# Patient Record
Sex: Female | Born: 1935 | Race: White | Hispanic: No | Smoking: Never smoker
Health system: Southern US, Community
[De-identification: ages and names within clinical notes are randomized; demographics above are authoritative.]

## PROBLEM LIST (undated history)

## (undated) DIAGNOSIS — K219 Gastro-esophageal reflux disease without esophagitis: Secondary | ICD-10-CM

## (undated) DIAGNOSIS — Z9889 Other specified postprocedural states: Secondary | ICD-10-CM

## (undated) DIAGNOSIS — I509 Heart failure, unspecified: Secondary | ICD-10-CM

## (undated) DIAGNOSIS — R112 Nausea with vomiting, unspecified: Secondary | ICD-10-CM

## (undated) DIAGNOSIS — R0600 Dyspnea, unspecified: Secondary | ICD-10-CM

## (undated) DIAGNOSIS — I10 Essential (primary) hypertension: Secondary | ICD-10-CM

## (undated) DIAGNOSIS — J189 Pneumonia, unspecified organism: Secondary | ICD-10-CM

## (undated) DIAGNOSIS — G629 Polyneuropathy, unspecified: Secondary | ICD-10-CM

## (undated) DIAGNOSIS — H35039 Hypertensive retinopathy, unspecified eye: Secondary | ICD-10-CM

## (undated) DIAGNOSIS — I429 Cardiomyopathy, unspecified: Secondary | ICD-10-CM

## (undated) DIAGNOSIS — I639 Cerebral infarction, unspecified: Secondary | ICD-10-CM

## (undated) DIAGNOSIS — M1990 Unspecified osteoarthritis, unspecified site: Secondary | ICD-10-CM

## (undated) DIAGNOSIS — H269 Unspecified cataract: Secondary | ICD-10-CM

## (undated) HISTORY — PX: APPENDECTOMY: SHX54

## (undated) HISTORY — PX: TONSILLECTOMY: SUR1361

## (undated) HISTORY — DX: Unspecified cataract: H26.9

## (undated) HISTORY — PX: TUBAL LIGATION: SHX77

## (undated) HISTORY — PX: ABDOMINAL HYSTERECTOMY: SHX81

## (undated) HISTORY — PX: CHOLECYSTECTOMY: SHX55

## (undated) HISTORY — DX: Hypertensive retinopathy, unspecified eye: H35.039

## (undated) HISTORY — PX: BLADDER SUSPENSION: SHX72

---

## 2006-10-05 DIAGNOSIS — Z8673 Personal history of transient ischemic attack (TIA), and cerebral infarction without residual deficits: Secondary | ICD-10-CM

## 2006-10-05 DIAGNOSIS — I639 Cerebral infarction, unspecified: Secondary | ICD-10-CM

## 2006-10-05 HISTORY — DX: Cerebral infarction, unspecified: I63.9

## 2011-10-06 HISTORY — PX: JOINT REPLACEMENT: SHX530

## 2015-10-06 HISTORY — PX: CARDIAC CATHETERIZATION: SHX172

## 2016-08-12 ENCOUNTER — Other Ambulatory Visit: Payer: Self-pay | Admitting: Family Medicine

## 2016-08-12 DIAGNOSIS — R9389 Abnormal findings on diagnostic imaging of other specified body structures: Secondary | ICD-10-CM

## 2016-08-14 ENCOUNTER — Ambulatory Visit
Admission: RE | Admit: 2016-08-14 | Discharge: 2016-08-14 | Disposition: A | Discharge status: Home / Self Care | Payer: Medicare Other | Source: Ambulatory Visit | Attending: Family Medicine | Admitting: Family Medicine

## 2016-08-14 DIAGNOSIS — R9389 Abnormal findings on diagnostic imaging of other specified body structures: Secondary | ICD-10-CM

## 2016-08-14 IMAGING — CT CT CHEST W/ CM
1 series · 15 of 34 positions shown, 19 images · IV contrast (agent unspecified)
Comparison: Chest radiographs [DATE], [DATE]

CLINICAL DATA: Fullness in the left hilar area, abnormal chest
x-ray

EXAM:
CT CHEST WITH CONTRAST
TECHNIQUE: Multidetector CT imaging of the chest was performed during
intravenous contrast administration.
CONTRAST:  89 mL [2F]

[Series 2: chest w/cm · axial · 0.72mm/px · z∈[+1132,+1386]mm · 15 of 149 slices shown, 19 images]
[im 11/149  mediastinal]
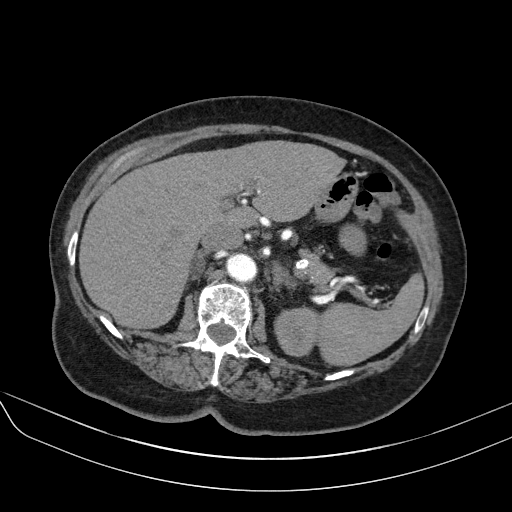
[im 11/149  lung]
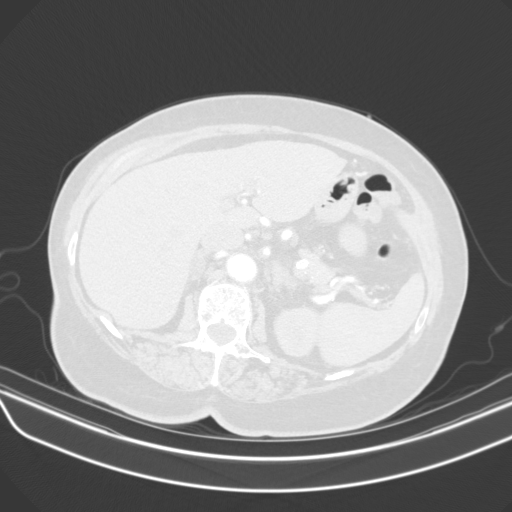
[im 22/149  lung]
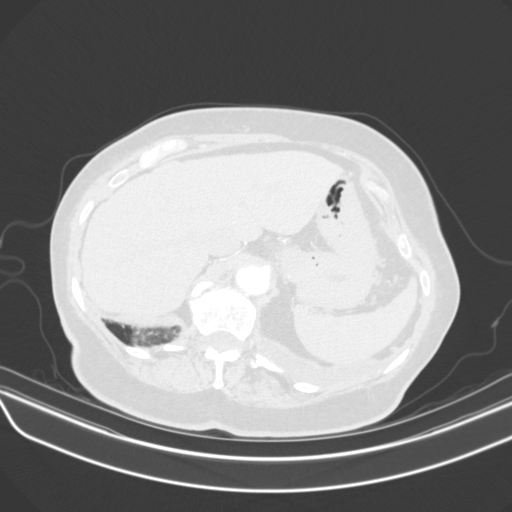
[im 30/149  lung]
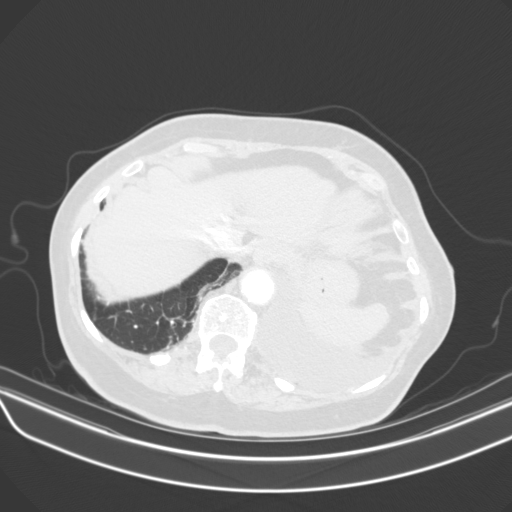
[im 39/149  lung]
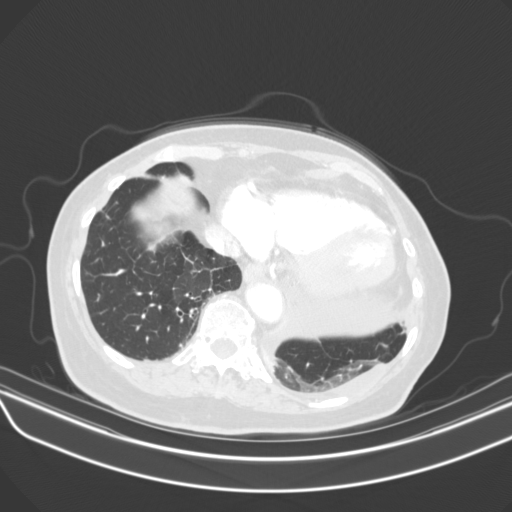
[im 50/149  mediastinal]
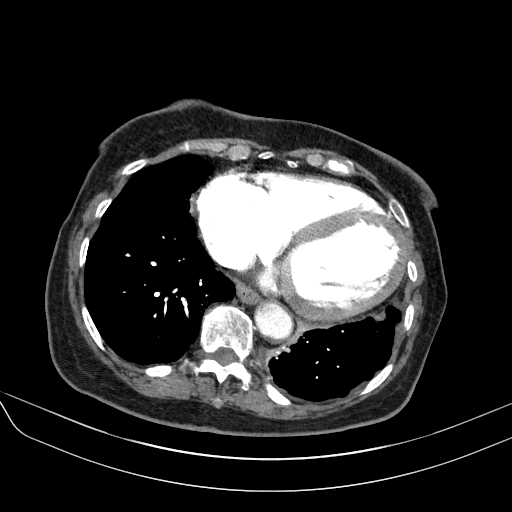
[im 50/149  lung]
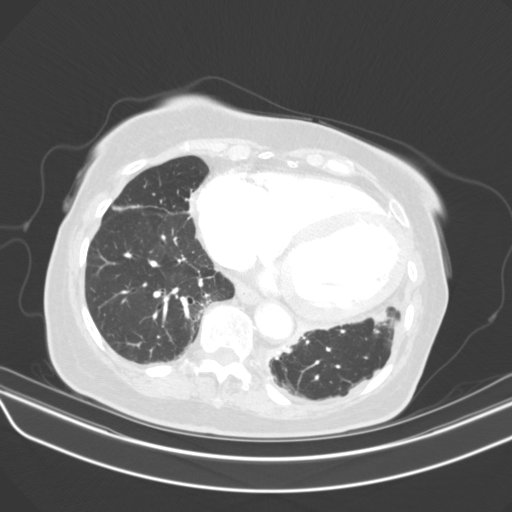
[im 60/149  lung]
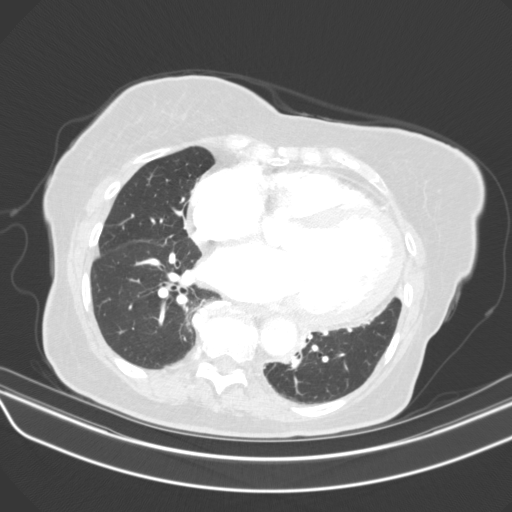
[im 66/149  lung]
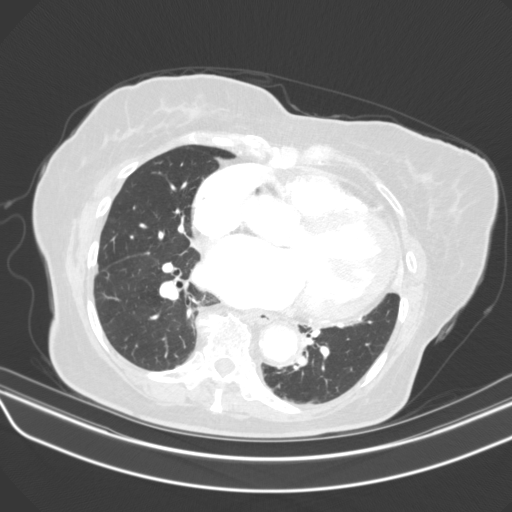
[im 77/149  lung]
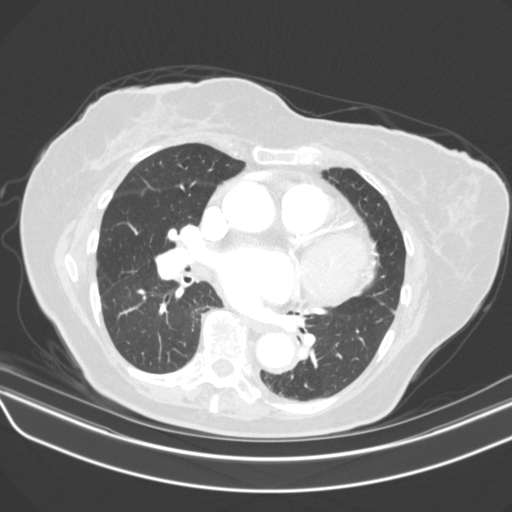
[im 83/149  mediastinal]
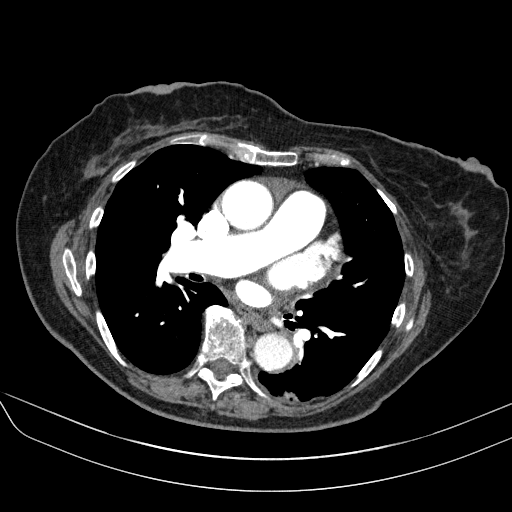
[im 83/149  lung]
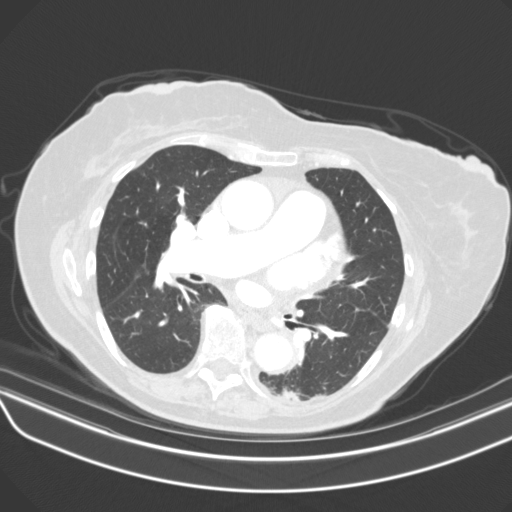
[im 89/149  lung]
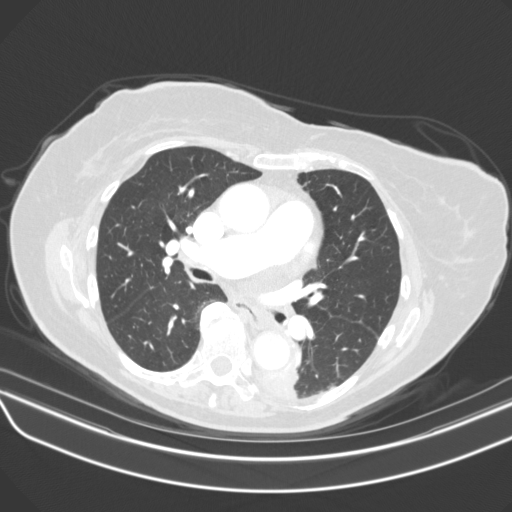
[im 99/149  lung]
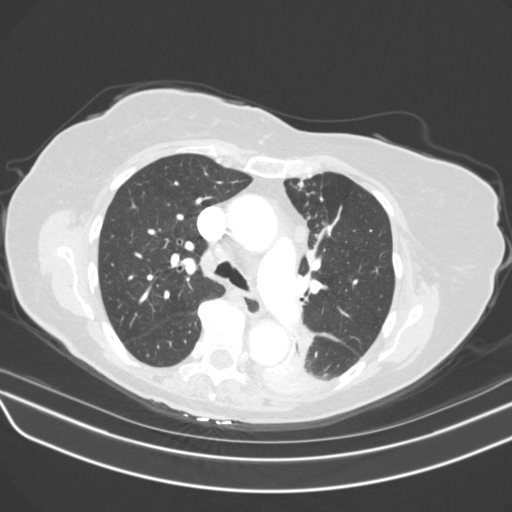
[im 110/149  lung]
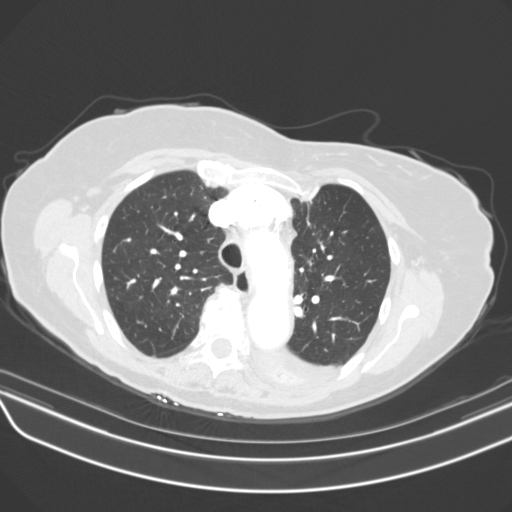
[im 119/149  mediastinal]
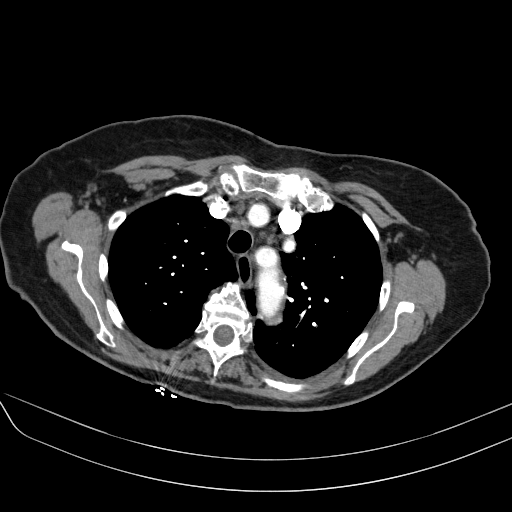
[im 119/149  lung]
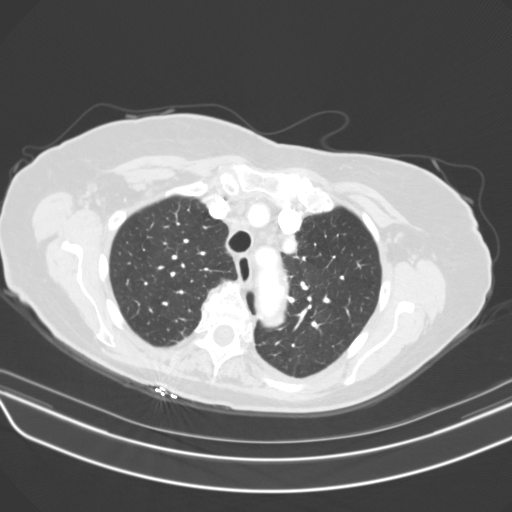
[im 127/149  lung]
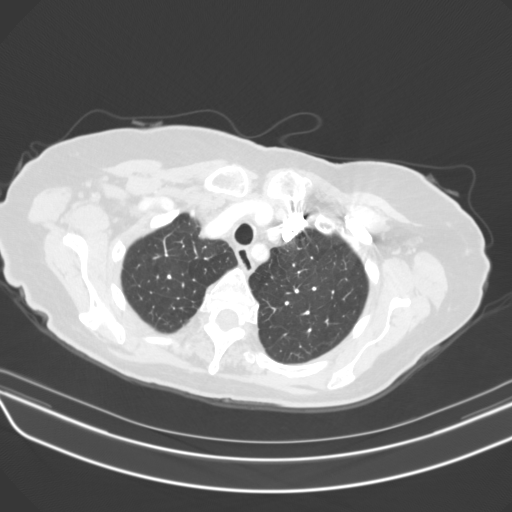
[im 138/149  lung]
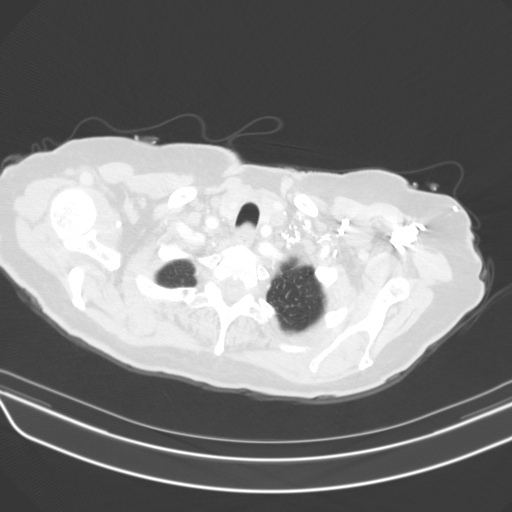

[15 of 34 positions shown; findings below may reference images not displayed]

FINDINGS: Cardiovascular: There is ectasia of the ascending aorta measuring up
to 3.3 cm. There is no dissection seen. The pulmonary arteries
appear enlarged, right greater than left. Moderate cardiomegaly.
Bovine arch variant. Trace pericardial effusion.

Mediastinum/Nodes: There are scattered lymph nodes within the
mediastinum. A AP window lymph node measures 12 mm. A precarinal
lymph node measures 1 cm. Trachea and mainstem bronchi appear within
normal limits. Thyroid gland is slightly heterogeneous. Air
distention of the esophagus but otherwise normal.

Lungs/Pleura: There is a small left-sided pleural effusion. There is
partial consolidation within the superior segment of the left lower
lobe with air bronchograms, corresponding to radiographic
abnormality. Other scattered foci of mildly nodular probable
infiltrate is seen within the left upper lobe medially. No
pneumothorax. Mild atelectasis or scarring in the left lower lobe
posteriorly.

Upper Abdomen: Upper abdomen demonstrates a 1.4 cm right adrenal
gland adenoma. Mild nodular thickening of left adrenal gland without
discrete mass. The stomach appears thickened but is decompressed.

Musculoskeletal: Multilevel degenerative changes of the spine. No
acute osseous abnormality.
IMPRESSION: 1. Partial consolidation within the superior segment of the left
lower lobe with air bronchograms, corresponding to radiographic
abnormality. Findings favor a pneumonia. There is associated small
left-sided pleural effusion. Additional nodular opacities are
present within the medial aspect of the left upper lobe suspicious
for additional pneumonic infiltrate. Short interval CT follow-up is
recommended to ensure clearing. Mild mediastinal adenopathy likely
reactive.

2. Cardiomegaly with trace pericardial effusion.

3. Mild ectasia of ascending aorta.  Bovine arch variant.

4. Gastric wall thickening but stomach is decompressed

5. 1.4 cm right adrenal gland adenoma.

## 2016-08-19 ENCOUNTER — Other Ambulatory Visit: Payer: Self-pay | Admitting: Family Medicine

## 2016-08-19 DIAGNOSIS — R9389 Abnormal findings on diagnostic imaging of other specified body structures: Secondary | ICD-10-CM

## 2016-09-17 ENCOUNTER — Other Ambulatory Visit: Payer: Self-pay | Admitting: Family Medicine

## 2016-09-17 ENCOUNTER — Ambulatory Visit
Admission: RE | Admit: 2016-09-17 | Discharge: 2016-09-17 | Disposition: A | Discharge status: Home / Self Care | Payer: Medicare Other | Source: Ambulatory Visit | Attending: Family Medicine | Admitting: Family Medicine

## 2016-09-17 ENCOUNTER — Other Ambulatory Visit: Payer: Medicare Other

## 2016-09-17 DIAGNOSIS — R9389 Abnormal findings on diagnostic imaging of other specified body structures: Secondary | ICD-10-CM

## 2016-09-17 DIAGNOSIS — I7100 Dissection of unspecified site of aorta: Secondary | ICD-10-CM

## 2016-09-17 DIAGNOSIS — J90 Pleural effusion, not elsewhere classified: Secondary | ICD-10-CM

## 2016-09-17 MED ORDER — IOPAMIDOL (ISOVUE-300) INJECTION 61%
80.0000 mL | Freq: Once | INTRAVENOUS | Status: AC | PRN
  Administered 2016-09-17: 80 mL via INTRAVENOUS

## 2016-09-18 ENCOUNTER — Ambulatory Visit
Admission: RE | Admit: 2016-09-18 | Discharge: 2016-09-18 | Disposition: A | Discharge status: Home / Self Care | Payer: Medicare Other | Source: Ambulatory Visit | Attending: Family Medicine | Admitting: Family Medicine

## 2016-09-18 DIAGNOSIS — J90 Pleural effusion, not elsewhere classified: Secondary | ICD-10-CM

## 2016-09-18 DIAGNOSIS — I7100 Dissection of unspecified site of aorta: Secondary | ICD-10-CM

## 2016-09-18 MED ORDER — IOPAMIDOL (ISOVUE-370) INJECTION 76%
100.0000 mL | Freq: Once | INTRAVENOUS | Status: AC | PRN
  Administered 2016-09-18: 100 mL via INTRAVENOUS

## 2016-10-01 ENCOUNTER — Other Ambulatory Visit: Payer: Medicare Other

## 2017-04-27 ENCOUNTER — Other Ambulatory Visit (HOSPITAL_COMMUNITY): Payer: Self-pay | Admitting: Emergency Medicine

## 2017-04-27 NOTE — Patient Instructions (Signed)
Barbara Kelley  04/27/2017   Your procedure is scheduled on: 05-04-17  Report to [facility] Main  Entrance   Report to admitting at 10:25AM   Call this number if you have problems the morning of surgery  [phone]   Remember: ONLY 1 PERSON MAY GO WITH YOU TO SHORT STAY TO GET  READY MORNING OF YOUR SURGERY.  Do not eat food or drink liquids :After Midnight.     Take these medicines the morning of surgery with A SIP OF WATER: carvedilol(coreg)                                 You may not have any metal on your body including hair pins and              piercings  Do not wear jewelry, make-up, lotions, powders or perfumes, deodorant             Do not wear nail polish.  Do not shave  48 hours prior to surgery.                Do not bring valuables to the hospital. Barbara Kelley.  Contacts, dentures or bridgework may not be worn into surgery.  Leave suitcase in the car. After surgery it may be brought to your room.               Please read over the following fact sheets you were given: _____________________________________________________________________           [facility] - Preparing for Surgery Before surgery, you can play an important role.  Because skin is not sterile, your skin needs to be as free of germs as possible.  You can reduce the number of germs on your skin by washing with CHG (chlorahexidine gluconate) soap before surgery.  CHG is an antiseptic cleaner which kills germs and bonds with the skin to continue killing germs even after washing. Please DO NOT use if you have an allergy to CHG or antibacterial soaps.  If your skin becomes reddened/irritated stop using the CHG and inform your nurse when you arrive at Short Stay. Do not shave (including legs and underarms) for at least 48 hours prior to the first CHG shower.  You may shave your face/neck. Please follow these instructions carefully:  1.   Shower with CHG Soap the night before surgery and the  morning of Surgery.  2.  If you choose to wash your hair, wash your hair first as usual with your  normal  shampoo.  3.  After you shampoo, rinse your hair and body thoroughly to remove the  shampoo.                           4.  Use CHG as you would any other liquid soap.  You can apply chg directly  to the skin and wash                       Gently with a scrungie or clean washcloth.  5.  Apply the CHG Soap to your body ONLY FROM THE NECK DOWN.   Do not use on face/  open                           Wound or open sores. Avoid contact with eyes, ears mouth and genitals (private parts).                       Wash face,  Genitals (private parts) with your normal soap.             6.  Wash thoroughly, paying special attention to the area where your surgery  will be performed.  7.  Thoroughly rinse your body with warm water from the neck down.  8.  DO NOT shower/wash with your normal soap after using and rinsing off  the CHG Soap.                9.  Pat yourself dry with a clean towel.            10.  Wear clean pajamas.            11.  Place clean sheets on your bed the night of your first shower and do not  sleep with pets. Day of Surgery : Do not apply any lotions/deodorants the morning of surgery.  Please wear clean clothes to the hospital/surgery center.  FAILURE TO FOLLOW THESE INSTRUCTIONS MAY RESULT IN THE CANCELLATION OF YOUR SURGERY PATIENT SIGNATURE_________________________________  NURSE SIGNATURE__________________________________  ________________________________________________________________________   Adam Phenix  An incentive spirometer is a tool that can help keep your lungs clear and active. This tool measures how well you are filling your lungs with each breath. Taking long deep breaths may help reverse or decrease the chance of developing breathing (pulmonary) problems (especially infection) following:  A long  period of time when you are unable to move or be active. BEFORE THE PROCEDURE   If the spirometer includes an indicator to show your best effort, your nurse or respiratory therapist will set it to a desired goal.  If possible, sit up straight or lean slightly forward. Try not to slouch.  Hold the incentive spirometer in an upright position. INSTRUCTIONS FOR USE  1. Sit on the edge of your bed if possible, or sit up as far as you can in bed or on a chair. 2. Hold the incentive spirometer in an upright position. 3. Breathe out normally. 4. Place the mouthpiece in your mouth and seal your lips tightly around it. 5. Breathe in slowly and as deeply as possible, raising the piston or the ball toward the top of the column. 6. Hold your breath for 3-5 seconds or for as long as possible. Allow the piston or ball to fall to the bottom of the column. 7. Remove the mouthpiece from your mouth and breathe out normally. 8. Rest for a few seconds and repeat Steps 1 through 7 at least 10 times every 1-2 hours when you are awake. Take your time and take a few normal breaths between deep breaths. 9. The spirometer may include an indicator to show your best effort. Use the indicator as a goal to work toward during each repetition. 10. After each set of 10 deep breaths, practice coughing to be sure your lungs are clear. If you have an incision (the cut made at the time of surgery), support your incision when coughing by placing a pillow or rolled up towels firmly against it. Once you are able to get out of bed, walk around indoors and  cough well. You may stop using the incentive spirometer when instructed by your caregiver.  RISKS AND COMPLICATIONS  Take your time so you do not get dizzy or light-headed.  If you are in pain, you may need to take or ask for pain medication before doing incentive spirometry. It is harder to take a deep breath if you are having pain. AFTER USE  Rest and breathe slowly and  easily.  It can be helpful to keep track of a log of your progress. Your caregiver can provide you with a simple table to help with this. If you are using the spirometer at home, follow these instructions: [place] IF:   You are having difficultly using the spirometer.  You have trouble using the spirometer as often as instructed.  Your pain medication is not giving enough relief while using the spirometer.  You develop fever of 100.5 F (38.1 C) or higher. SEEK IMMEDIATE MEDICAL CARE IF:   You cough up bloody sputum that had not been present before.  You develop fever of 102 F (38.9 C) or greater.  You develop worsening pain at or near the incision site. MAKE SURE YOU:   Understand these instructions.  Will watch your condition.  Will get help right away if you are not doing well or get worse. Document Released: 02/01/2007 Document Revised: 12/14/2011 Document Reviewed: 04/04/2007 Exit[employer] Patient Information 2014 [employer], [place].   ________________________________________________________________________  WHAT IS A BLOOD TRANSFUSION? Blood Transfusion Information  A transfusion is the replacement of blood or some of its parts. Blood is made up of multiple cells which provide different functions.  Red blood cells carry oxygen and are used for blood loss replacement.  White blood cells fight against infection.  Platelets control bleeding.  Plasma helps clot blood.  Other blood products are available for specialized needs, such as hemophilia or other clotting disorders. BEFORE THE TRANSFUSION  Who gives blood for transfusions?   Healthy volunteers who are fully evaluated to make sure their blood is safe. This is blood bank blood. Transfusion therapy is the safest it has ever been in the practice of medicine. Before blood is taken from a donor, a complete history is taken to make sure that person has no history of diseases nor engages in risky social  behavior (examples are intravenous drug use or sexual activity with multiple partners). The donor's travel history is screened to minimize risk of transmitting infections, such as malaria. The donated blood is tested for signs of infectious diseases, such as HIV and hepatitis. The blood is then tested to be sure it is compatible with you in order to minimize the chance of a transfusion reaction. If you or a relative donates blood, this is often done in anticipation of surgery and is not appropriate for emergency situations. It takes many days to process the donated blood. RISKS AND COMPLICATIONS Although transfusion therapy is very safe and saves many lives, the main dangers of transfusion include:   Getting an infectious disease.  Developing a transfusion reaction. This is an allergic reaction to something in the blood you were given. Every precaution is taken to prevent this. The decision to have a blood transfusion has been considered carefully by your caregiver before blood is given. Blood is not given unless the benefits outweigh the risks. AFTER THE TRANSFUSION  Right after receiving a blood transfusion, you will usually feel much better and more energetic. This is especially true if your red blood cells have gotten low (anemic).  The transfusion raises the level of the red blood cells which carry oxygen, and this usually causes an energy increase.  The nurse administering the transfusion will monitor you carefully for complications. HOME CARE INSTRUCTIONS  No special instructions are needed after a transfusion. You may find your energy is better. Speak with your caregiver about any limitations on activity for underlying diseases you may have. SEEK MEDICAL CARE IF:   Your condition is not improving after your transfusion.  You develop redness or irritation at the intravenous (IV) site. SEEK IMMEDIATE MEDICAL CARE IF:  Any of the following symptoms occur over the next 12 hours:  Shaking  chills.  You have a temperature by mouth above 102 F (38.9 C), not controlled by medicine.  Chest, back, or muscle pain.  People around you feel you are not acting correctly or are confused.  Shortness of breath or difficulty breathing.  Dizziness and fainting.  You get a rash or develop hives.  You have a decrease in urine output.  Your urine turns a dark color or changes to pink, red, or brown. Any of the following symptoms occur over the next 10 days:  You have a temperature by mouth above 102 F (38.9 C), not controlled by medicine.  Shortness of breath.  Weakness after normal activity.  The white part of the eye turns yellow (jaundice).  You have a decrease in the amount of urine or are urinating less often.  Your urine turns a dark color or changes to pink, red, or brown. Document Released: 09/18/2000 Document Revised: 12/14/2011 Document Reviewed: 05/07/2008 [facility] Patient Information 2014 [place], [place].  _______________________________________________________________________

## 2017-04-27 NOTE — Progress Notes (Signed)
[place] cardiology Dr Wynonia Lawman 02-26-17 on chart   EKG 02-26-17 on chart   CT angio chest 09-18-16 epic

## 2017-04-28 ENCOUNTER — Encounter (HOSPITAL_COMMUNITY): Payer: Self-pay

## 2017-04-28 ENCOUNTER — Encounter (INDEPENDENT_AMBULATORY_CARE_PROVIDER_SITE_OTHER): Payer: Self-pay

## 2017-04-28 ENCOUNTER — Encounter (HOSPITAL_COMMUNITY)
Admission: RE | Admit: 2017-04-28 | Discharge: 2017-04-28 | Disposition: A | Discharge status: Home / Self Care | Payer: Medicare Other | Source: Ambulatory Visit | Attending: Orthopedic Surgery | Admitting: Orthopedic Surgery

## 2017-04-28 DIAGNOSIS — M1712 Unilateral primary osteoarthritis, left knee: Secondary | ICD-10-CM | POA: Diagnosis not present

## 2017-04-28 DIAGNOSIS — Z01818 Encounter for other preprocedural examination: Secondary | ICD-10-CM | POA: Diagnosis not present

## 2017-04-28 HISTORY — DX: Cardiomyopathy, unspecified: I42.9

## 2017-04-28 HISTORY — DX: Essential (primary) hypertension: I10

## 2017-04-28 HISTORY — DX: Nausea with vomiting, unspecified: Z98.890

## 2017-04-28 HISTORY — DX: Polyneuropathy, unspecified: G62.9

## 2017-04-28 HISTORY — DX: Pneumonia, unspecified organism: J18.9

## 2017-04-28 HISTORY — DX: Unspecified osteoarthritis, unspecified site: M19.90

## 2017-04-28 HISTORY — DX: Cerebral infarction, unspecified: I63.9

## 2017-04-28 HISTORY — DX: Nausea with vomiting, unspecified: R11.2

## 2017-04-28 HISTORY — DX: Heart failure, unspecified: I50.9

## 2017-04-28 HISTORY — DX: Gastro-esophageal reflux disease without esophagitis: K21.9

## 2017-04-28 LAB — CBC
HEMATOCRIT: 42.5 % (ref 36.0–46.0)
HEMOGLOBIN: 13.9 g/dL (ref 12.0–15.0)
MCH: 28.4 pg (ref 26.0–34.0)
MCHC: 32.7 g/dL (ref 30.0–36.0)
MCV: 86.9 fL (ref 78.0–100.0)
Platelets: 198 10*3/uL (ref 150–400)
RBC: 4.89 MIL/uL (ref 3.87–5.11)
RDW: 13.4 % (ref 11.5–15.5)
WBC: 9.5 10*3/uL (ref 4.0–10.5)

## 2017-04-28 LAB — SURGICAL PCR SCREEN
MRSA, PCR: NEGATIVE
STAPHYLOCOCCUS AUREUS: NEGATIVE

## 2017-04-28 LAB — BASIC METABOLIC PANEL
ANION GAP: 7 (ref 5–15)
BUN: 16 mg/dL (ref 6–20)
CALCIUM: 9.5 mg/dL (ref 8.9–10.3)
CHLORIDE: 102 mmol/L (ref 101–111)
CO2: 32 mmol/L (ref 22–32)
CREATININE: 0.73 mg/dL (ref 0.44–1.00)
GFR calc non Af Amer: >60 mL/min (ref >60)
GLUCOSE: 117 mg/dL — AB (ref 65–99)
Potassium: 4.2 mmol/L (ref 3.5–5.1)
Sodium: 141 mmol/L (ref 135–145)

## 2017-04-28 LAB — ABO/RH: ABO/RH(D): A POS

## 2017-04-28 NOTE — Progress Notes (Signed)
Requested records received . Cardiac catherization results personally reviewed by Dr Jenita Seashore of anesthesiology . OK to proceed with surgery as  Planned.

## 2017-04-28 NOTE — Progress Notes (Signed)
RN called and spoke with Dr Annye Asa about Barbara Kelley d/t ECHO result showing 25-30% EF in presence of nonischemic cardiomyopathy . Reviewed with Dr Glennon Mac, results of ECHO, pt cardiac hx and management including recommendations and clearance from her cardiologist . Dr Glennon Mac requests that Barbara Kelley cardiac catherization be obtained in order to examine for presence of any blockages . Otherwise Glennon Mac, agrees with patient cardiologist  that patient is a "stable risk" and may proceed with surgery. RN informed Barbara Kelley of this recommendation and Barbara Kelley assured Rn that her cardiologist should have the records of the [place].   RN called cardiology office and LVMM requesting card. Cath records .

## 2017-04-29 NOTE — H&P (Signed)
TOTAL KNEE ADMISSION H&P  Patient is being admitted for left total knee arthroplasty.  Subjective:  Chief Complaint:   Left knee primary OA / pain  HPI: Barbara Kelley, 83 y.o. female, has a history of pain and functional disability in the left knee due to arthritis and has failed non-surgical conservative treatments for greater than 12 weeks to include NSAID's and/or analgesics, corticosteriod injections, use of assistive devices and activity modification.  Onset of symptoms was gradual, starting 25+ years ago with gradually worsening course since that time. The patient noted no past surgery on the left knee(s).  Patient currently rates pain in the left knee(s) at 8 out of 10 with activity. Patient has worsening of pain with activity and weight bearing, pain that interferes with activities of daily living, pain with passive range of motion, crepitus and joint swelling.  Patient has evidence of periarticular osteophytes and joint space narrowing by imaging studies.  There is no active infection.  Risks, benefits and expectations were discussed with the patient.  Risks including but not limited to the risk of anesthesia, blood clots, nerve damage, blood vessel damage, failure of the prosthesis, infection and up to and including death.  Patient understand the risks, benefits and expectations and wishes to proceed with surgery.   PCP: [name], MD  D/C Plans:       [facility]  Post-op Meds:       No Rx given   Tranexamic Acid:      To be given - IV   Decadron:      Is to be given  FYI:     ASA  Norco   DME:   Pt already has equipment   PT: HHPT then OPPT   Past Medical History:  Diagnosis Date  . Arthritis   . Cardiomyopathy ([place])   . Congestive heart failure (CHF) ([place])   . GERD (gastroesophageal reflux disease)    currently non problematic   . Hypertension   . 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 Pneumonia    x2 most recent fall of  2017   . PONV (postoperative nausea and vomiting)   . Stroke [facility]) 2008   deficit of neuroplathy in left hand and left foot     Past Surgical History:  Procedure Laterality Date  . ABDOMINAL HYSTERECTOMY    . APPENDECTOMY    . BLADDER SUSPENSION    . CARDIAC CATHETERIZATION  10/2015  . CHOLECYSTECTOMY    . JOINT REPLACEMENT Left 2013   left hip  . TONSILLECTOMY    . TUBAL LIGATION      No prescriptions prior to admission.   Allergies  Allergen Reactions  . Doxycycline Nausea Only    Vertigo  . Macrobid [Nitrofurantoin] Other (See Comments)    Nerve pain  . Valsartan Other (See Comments)    Blood pressure rise  . Calcium Channel Blockers Palpitations    Blow pressure rise / tachycardia   . Penicillins Rash    Social History  Substance Use Topics  . Smoking status: Never Smoker  . Smokeless tobacco: Never Used  . Alcohol use No       Review of Systems  Constitutional: Negative.   HENT: Negative.   Eyes: Negative.   Respiratory: Negative.   Cardiovascular: Negative.   Gastrointestinal: Positive for heartburn.  Genitourinary: Positive for frequency.  Musculoskeletal: Positive for joint pain.  Skin: Negative.   Neurological: Negative.  Endo/Heme/Allergies: Positive for environmental allergies.  Psychiatric/Behavioral: Negative.     Objective:  Physical Exam  Constitutional: She is oriented to person, place, and time. She appears well-developed.  HENT:  Head: Normocephalic.  Eyes: Pupils are equal, round, and reactive to light.  Neck: Neck supple. No JVD present. No tracheal deviation present. No thyromegaly present.  Cardiovascular: Normal rate, regular rhythm and intact distal pulses.   Respiratory: Effort normal and breath sounds normal. No respiratory distress. She has no wheezes.  GI: Soft. There is no tenderness. There is no guarding.  Musculoskeletal:       Left knee: She exhibits decreased range of motion, swelling and bony tenderness. She exhibits  no ecchymosis, no deformity, no laceration and no erythema. Tenderness found.  Lymphadenopathy:    She has no cervical adenopathy.  Neurological: She is alert and oriented to person, place, and time. A sensory deficit (neuropathy in left foot (from stroke)) is present.  Skin: Skin is warm and dry.  Psychiatric: She has a normal mood and affect.    Vital signs in last 24 hours: Temp:  [98.2 F (36.8 C)] 98.2 F (36.8 C) (07/25 1129) Pulse Rate:  [75] 75 (07/25 1129) Resp:  [18] 18 (07/25 1129) BP: (168)/(86) 168/86 (07/25 1129) SpO2:  [98 %] 98 % (07/25 1129) Weight:  [59.5 kg (131 lb 1.6 oz)] 59.5 kg (131 lb 1.6 oz) (07/25 1129)  Labs:   Estimated body mass index is 26.48 kg/m as calculated from the following:   Height as of 04/28/17: 4\' 11"  (1.499 m).   Weight as of 04/28/17: 59.5 kg (131 lb 1.6 oz).   Imaging Review Plain radiographs demonstrate severe degenerative joint disease of the left knee(s).  The bone quality appears to be good for age and reported activity level.  Assessment/Plan:  End stage arthritis, left knee   The patient history, physical examination, clinical judgment of the provider and imaging studies are consistent with end stage degenerative joint disease of the left knee(s) and total knee arthroplasty is deemed medically necessary. The treatment options including medical management, injection therapy arthroscopy and arthroplasty were discussed at length. The risks and benefits of total knee arthroplasty were presented and reviewed. The risks due to aseptic loosening, infection, stiffness, patella tracking problems, thromboembolic complications and other imponderables were discussed. The patient acknowledged the explanation, agreed to proceed with the plan and consent was signed. Patient is being admitted for inpatient treatment for surgery, pain control, PT, OT, prophylactic antibiotics, VTE prophylaxis, progressive ambulation and ADL's and discharge planning.  The patient is planning to be discharged home.    [place] P[place] Jestin Burbach   PA-C  04/29/2017, 10:52 AM

## 2017-05-04 ENCOUNTER — Inpatient Hospital Stay (HOSPITAL_COMMUNITY)
Admission: RE | Admit: 2017-05-04 | Discharge: 2017-05-06 | DRG: 470 | Disposition: A | Discharge status: Skilled Nursing Facility | Payer: Medicare Other | Source: Ambulatory Visit | Attending: Orthopedic Surgery | Admitting: Orthopedic Surgery

## 2017-05-04 ENCOUNTER — Inpatient Hospital Stay (HOSPITAL_COMMUNITY): Payer: Medicare Other | Admitting: Anesthesiology

## 2017-05-04 ENCOUNTER — Encounter (HOSPITAL_COMMUNITY)
Admission: RE | Disposition: A | Discharge status: Skilled Nursing Facility | Payer: Self-pay | Source: Ambulatory Visit | Attending: Orthopedic Surgery

## 2017-05-04 ENCOUNTER — Encounter (HOSPITAL_COMMUNITY): Payer: Self-pay | Admitting: *Deleted

## 2017-05-04 DIAGNOSIS — Z96652 Presence of left artificial knee joint: Secondary | ICD-10-CM

## 2017-05-04 DIAGNOSIS — I509 Heart failure, unspecified: Secondary | ICD-10-CM | POA: Diagnosis present

## 2017-05-04 DIAGNOSIS — Z90710 Acquired absence of both cervix and uterus: Secondary | ICD-10-CM | POA: Diagnosis not present

## 2017-05-04 DIAGNOSIS — Z881 Allergy status to other antibiotic agents status: Secondary | ICD-10-CM | POA: Diagnosis not present

## 2017-05-04 DIAGNOSIS — I429 Cardiomyopathy, unspecified: Secondary | ICD-10-CM | POA: Diagnosis not present

## 2017-05-04 DIAGNOSIS — I110 Hypertensive heart disease with heart failure: Secondary | ICD-10-CM | POA: Diagnosis not present

## 2017-05-04 DIAGNOSIS — I69398 Other sequelae of cerebral infarction: Secondary | ICD-10-CM | POA: Diagnosis not present

## 2017-05-04 DIAGNOSIS — K219 Gastro-esophageal reflux disease without esophagitis: Secondary | ICD-10-CM | POA: Diagnosis present

## 2017-05-04 DIAGNOSIS — M1712 Unilateral primary osteoarthritis, left knee: Principal | ICD-10-CM | POA: Diagnosis present

## 2017-05-04 DIAGNOSIS — Z880 Allergy status to penicillin: Secondary | ICD-10-CM

## 2017-05-04 DIAGNOSIS — Z888 Allergy status to other drugs, medicaments and biological substances status: Secondary | ICD-10-CM | POA: Diagnosis not present

## 2017-05-04 DIAGNOSIS — Z96659 Presence of unspecified artificial knee joint: Secondary | ICD-10-CM

## 2017-05-04 HISTORY — PX: TOTAL KNEE ARTHROPLASTY: SHX125

## 2017-05-04 LAB — TYPE AND SCREEN
ABO/RH(D): A POS
ANTIBODY SCREEN: NEGATIVE

## 2017-05-04 SURGERY — ARTHROPLASTY, KNEE, TOTAL
Anesthesia: Monitor Anesthesia Care | Site: Knee | Laterality: Left

## 2017-05-04 MED ORDER — DEXAMETHASONE SODIUM PHOSPHATE 10 MG/ML IJ SOLN
10.0000 mg | Freq: Once | INTRAMUSCULAR | Status: AC
  Administered 2017-05-04: 10 mg via INTRAVENOUS

## 2017-05-04 MED ORDER — TRANEXAMIC ACID 1000 MG/10ML IV SOLN
1000.0000 mg | INTRAVENOUS | Status: AC
  Administered 2017-05-04: 1000 mg via INTRAVENOUS
  Filled 2017-05-04: qty 10

## 2017-05-04 MED ORDER — KETOROLAC TROMETHAMINE 30 MG/ML IJ SOLN
INTRAMUSCULAR | Status: DC | PRN
  Administered 2017-05-04: 30 mg

## 2017-05-04 MED ORDER — FUROSEMIDE 20 MG PO TABS
20.0000 mg | ORAL_TABLET | Freq: Every day | ORAL | Status: DC
  Administered 2017-05-04 – 2017-05-06 (×3): 20 mg via ORAL
  Filled 2017-05-04 (×3): qty 1

## 2017-05-04 MED ORDER — HYDROCODONE-ACETAMINOPHEN 7.5-325 MG PO TABS
1.0000 | ORAL_TABLET | ORAL | Status: DC
  Administered 2017-05-04 – 2017-05-06 (×12): 1 via ORAL
  Administered 2017-05-06: 2 via ORAL
  Administered 2017-05-06: 1 via ORAL
  Filled 2017-05-04 (×2): qty 1
  Filled 2017-05-04: qty 2
  Filled 2017-05-04 (×3): qty 1
  Filled 2017-05-04: qty 2
  Filled 2017-05-04 (×7): qty 1
  Filled 2017-05-04: qty 2
  Filled 2017-05-04: qty 1

## 2017-05-04 MED ORDER — ONDANSETRON HCL 4 MG/2ML IJ SOLN
INTRAMUSCULAR | Status: AC
  Filled 2017-05-04: qty 2

## 2017-05-04 MED ORDER — PROPOFOL 10 MG/ML IV BOLUS
INTRAVENOUS | Status: AC
  Filled 2017-05-04: qty 20

## 2017-05-04 MED ORDER — CHLORHEXIDINE GLUCONATE 4 % EX LIQD: 60.0000 mL | Freq: Once | CUTANEOUS | Status: DC

## 2017-05-04 MED ORDER — POLYETHYLENE GLYCOL 3350 17 G PO PACK
17.0000 g | PACK | Freq: Two times a day (BID) | ORAL | Status: DC
  Administered 2017-05-04 – 2017-05-06 (×3): 17 g via ORAL
  Filled 2017-05-04 (×3): qty 1

## 2017-05-04 MED ORDER — DEXAMETHASONE SODIUM PHOSPHATE 10 MG/ML IJ SOLN
10.0000 mg | Freq: Once | INTRAMUSCULAR | Status: AC
  Administered 2017-05-05: 10 mg via INTRAVENOUS
  Filled 2017-05-04: qty 1

## 2017-05-04 MED ORDER — FENTANYL CITRATE (PF) 100 MCG/2ML IJ SOLN
INTRAMUSCULAR | Status: AC
  Filled 2017-05-04: qty 2

## 2017-05-04 MED ORDER — SODIUM CHLORIDE 0.9 % IV SOLN
INTRAVENOUS | Status: DC
  Administered 2017-05-04 – 2017-05-05 (×2): via INTRAVENOUS

## 2017-05-04 MED ORDER — MIDAZOLAM HCL 2 MG/2ML IJ SOLN: 2.0000 mg | Freq: Once | INTRAMUSCULAR | Status: DC

## 2017-05-04 MED ORDER — PROPOFOL 10 MG/ML IV BOLUS
INTRAVENOUS | Status: DC | PRN
  Administered 2017-05-04: 20 mg via INTRAVENOUS
  Administered 2017-05-04: 30 mg via INTRAVENOUS

## 2017-05-04 MED ORDER — METOCLOPRAMIDE HCL 5 MG PO TABS
5.0000 mg | ORAL_TABLET | Freq: Three times a day (TID) | ORAL | Status: DC | PRN
  Filled 2017-05-04: qty 2

## 2017-05-04 MED ORDER — PHENOL 1.4 % MT LIQD: 1.0000 | OROMUCOSAL | Status: DC | PRN

## 2017-05-04 MED ORDER — CEFAZOLIN SODIUM-DEXTROSE 2-4 GM/100ML-% IV SOLN
2.0000 g | INTRAVENOUS | Status: AC
  Administered 2017-05-04: 2 g via INTRAVENOUS

## 2017-05-04 MED ORDER — BUPIVACAINE-EPINEPHRINE (PF) 0.25% -1:200000 IJ SOLN
INTRAMUSCULAR | Status: DC | PRN
  Administered 2017-05-04: 30 mL

## 2017-05-04 MED ORDER — CELECOXIB 200 MG PO CAPS
200.0000 mg | ORAL_CAPSULE | Freq: Two times a day (BID) | ORAL | Status: DC
  Administered 2017-05-04 – 2017-05-06 (×4): 200 mg via ORAL
  Filled 2017-05-04 (×4): qty 1

## 2017-05-04 MED ORDER — ALUM & MAG HYDROXIDE-SIMETH 200-200-20 MG/5ML PO SUSP: 15.0000 mL | ORAL | Status: DC | PRN

## 2017-05-04 MED ORDER — ASPIRIN 81 MG PO CHEW
81.0000 mg | CHEWABLE_TABLET | Freq: Two times a day (BID) | ORAL | Status: DC
  Administered 2017-05-04 – 2017-05-06 (×4): 81 mg via ORAL
  Filled 2017-05-04 (×4): qty 1

## 2017-05-04 MED ORDER — DIPHENHYDRAMINE HCL 25 MG PO CAPS
25.0000 mg | ORAL_CAPSULE | Freq: Four times a day (QID) | ORAL | Status: DC | PRN
Start: 2017-05-04 — End: 2017-05-06

## 2017-05-04 MED ORDER — BUPIVACAINE-EPINEPHRINE 0.25% -1:200000 IJ SOLN
INTRAMUSCULAR | Status: AC
  Filled 2017-05-04: qty 1

## 2017-05-04 MED ORDER — FENTANYL CITRATE (PF) 100 MCG/2ML IJ SOLN: 25.0000 ug | INTRAMUSCULAR | Status: DC | PRN

## 2017-05-04 MED ORDER — SODIUM CHLORIDE 0.9 % IR SOLN
Status: DC | PRN
  Administered 2017-05-04: 1000 mL

## 2017-05-04 MED ORDER — PHENYLEPHRINE HCL 10 MG/ML IJ SOLN
INTRAVENOUS | Status: DC | PRN
  Administered 2017-05-04: 40 ug/min via INTRAVENOUS

## 2017-05-04 MED ORDER — METHOCARBAMOL 500 MG PO TABS
500.0000 mg | ORAL_TABLET | Freq: Four times a day (QID) | ORAL | Status: DC | PRN
  Administered 2017-05-04 – 2017-05-06 (×5): 500 mg via ORAL
  Filled 2017-05-04 (×5): qty 1

## 2017-05-04 MED ORDER — LACTATED RINGERS IV SOLN
INTRAVENOUS | Status: DC
  Administered 2017-05-04 (×2): via INTRAVENOUS

## 2017-05-04 MED ORDER — FENTANYL CITRATE (PF) 100 MCG/2ML IJ SOLN
INTRAMUSCULAR | Status: AC
  Administered 2017-05-04: 50 ug via INTRAVENOUS
  Filled 2017-05-04: qty 2

## 2017-05-04 MED ORDER — ROCURONIUM BROMIDE 50 MG/5ML IV SOSY
PREFILLED_SYRINGE | INTRAVENOUS | Status: AC
  Filled 2017-05-04: qty 5

## 2017-05-04 MED ORDER — MENTHOL 3 MG MT LOZG
1.0000 | LOZENGE | OROMUCOSAL | Status: DC | PRN
Start: 2017-05-04 — End: 2017-05-06

## 2017-05-04 MED ORDER — LIDOCAINE 2% (20 MG/ML) 5 ML SYRINGE
INTRAMUSCULAR | Status: AC
  Filled 2017-05-04: qty 5

## 2017-05-04 MED ORDER — KETOROLAC TROMETHAMINE 30 MG/ML IJ SOLN
INTRAMUSCULAR | Status: AC
  Filled 2017-05-04: qty 1

## 2017-05-04 MED ORDER — METOCLOPRAMIDE HCL 5 MG/ML IJ SOLN: 5.0000 mg | Freq: Three times a day (TID) | INTRAMUSCULAR | Status: DC | PRN

## 2017-05-04 MED ORDER — CEFAZOLIN SODIUM-DEXTROSE 2-4 GM/100ML-% IV SOLN
INTRAVENOUS | Status: AC
  Filled 2017-05-04: qty 100

## 2017-05-04 MED ORDER — DEXAMETHASONE SODIUM PHOSPHATE 10 MG/ML IJ SOLN
INTRAMUSCULAR | Status: AC
  Filled 2017-05-04: qty 1

## 2017-05-04 MED ORDER — ONDANSETRON HCL 4 MG/2ML IJ SOLN: 4.0000 mg | Freq: Once | INTRAMUSCULAR | Status: DC | PRN

## 2017-05-04 MED ORDER — MIDAZOLAM HCL 2 MG/2ML IJ SOLN
INTRAMUSCULAR | Status: AC
  Filled 2017-05-04: qty 2

## 2017-05-04 MED ORDER — FENTANYL CITRATE (PF) 100 MCG/2ML IJ SOLN
25.0000 ug | INTRAMUSCULAR | Status: DC | PRN
  Administered 2017-05-04: 50 ug via INTRAVENOUS
  Filled 2017-05-04: qty 2

## 2017-05-04 MED ORDER — BISACODYL 10 MG RE SUPP: 10.0000 mg | Freq: Every day | RECTAL | Status: DC | PRN

## 2017-05-04 MED ORDER — SODIUM CHLORIDE 0.9 % IJ SOLN
INTRAMUSCULAR | Status: DC | PRN
  Administered 2017-05-04: 30 mL

## 2017-05-04 MED ORDER — CARVEDILOL 3.125 MG PO TABS
3.1250 mg | ORAL_TABLET | Freq: Two times a day (BID) | ORAL | Status: DC
  Administered 2017-05-04 – 2017-05-06 (×4): 3.125 mg via ORAL
  Filled 2017-05-04 (×4): qty 1

## 2017-05-04 MED ORDER — FENTANYL CITRATE (PF) 100 MCG/2ML IJ SOLN
100.0000 ug | Freq: Once | INTRAMUSCULAR | Status: AC
  Administered 2017-05-04 (×2): 50 ug via INTRAVENOUS

## 2017-05-04 MED ORDER — TRANEXAMIC ACID 1000 MG/10ML IV SOLN
1000.0000 mg | Freq: Once | INTRAVENOUS | Status: AC
  Administered 2017-05-04: 1000 mg via INTRAVENOUS
  Filled 2017-05-04: qty 10

## 2017-05-04 MED ORDER — FERROUS SULFATE 325 (65 FE) MG PO TABS
325.0000 mg | ORAL_TABLET | Freq: Three times a day (TID) | ORAL | Status: DC
  Administered 2017-05-04 – 2017-05-06 (×4): 325 mg via ORAL
  Filled 2017-05-04 (×5): qty 1

## 2017-05-04 MED ORDER — BUPIVACAINE IN DEXTROSE 0.75-8.25 % IT SOLN
INTRATHECAL | Status: DC | PRN
  Administered 2017-05-04: 1.8 mL via INTRATHECAL

## 2017-05-04 MED ORDER — SODIUM CHLORIDE 0.9 % IJ SOLN
INTRAMUSCULAR | Status: AC
  Filled 2017-05-04: qty 50

## 2017-05-04 MED ORDER — DOCUSATE SODIUM 100 MG PO CAPS
100.0000 mg | ORAL_CAPSULE | Freq: Two times a day (BID) | ORAL | Status: DC
  Administered 2017-05-04 – 2017-05-06 (×4): 100 mg via ORAL
  Filled 2017-05-04 (×4): qty 1

## 2017-05-04 MED ORDER — ONDANSETRON HCL 4 MG PO TABS: 4.0000 mg | ORAL_TABLET | Freq: Four times a day (QID) | ORAL | Status: DC | PRN

## 2017-05-04 MED ORDER — METHOCARBAMOL 1000 MG/10ML IJ SOLN
500.0000 mg | Freq: Four times a day (QID) | INTRAVENOUS | Status: DC | PRN
  Filled 2017-05-04: qty 5

## 2017-05-04 MED ORDER — MAGNESIUM CITRATE PO SOLN: 1.0000 | Freq: Once | ORAL | Status: DC | PRN

## 2017-05-04 MED ORDER — ONDANSETRON HCL 4 MG/2ML IJ SOLN
INTRAMUSCULAR | Status: DC | PRN
  Administered 2017-05-04: 4 mg via INTRAVENOUS

## 2017-05-04 MED ORDER — PROPOFOL 500 MG/50ML IV EMUL
INTRAVENOUS | Status: DC | PRN
  Administered 2017-05-04: 50 ug/kg/min via INTRAVENOUS

## 2017-05-04 MED ORDER — ONDANSETRON HCL 4 MG/2ML IJ SOLN: 4.0000 mg | Freq: Four times a day (QID) | INTRAMUSCULAR | Status: DC | PRN

## 2017-05-04 MED ORDER — CEFAZOLIN SODIUM-DEXTROSE 2-4 GM/100ML-% IV SOLN
2.0000 g | Freq: Four times a day (QID) | INTRAVENOUS | Status: AC
  Administered 2017-05-04 (×2): 2 g via INTRAVENOUS
  Filled 2017-05-04 (×2): qty 100

## 2017-05-04 SURGICAL SUPPLY — 45 items
BAG DECANTER FOR FLEXI CONT (MISCELLANEOUS) IMPLANT
BAG ZIPLOCK 12X15 (MISCELLANEOUS) IMPLANT
BANDAGE ACE 6X5 VEL STRL LF (GAUZE/BANDAGES/DRESSINGS) ×2 IMPLANT
BLADE SAW SGTL 11.0X1.19X90.0M (BLADE) ×2 IMPLANT
BLADE SAW SGTL 13.0X1.19X90.0M (BLADE) ×2 IMPLANT
BOWL SMART MIX CTS (DISPOSABLE) ×2 IMPLANT
CAPT KNEE TOTAL 3 ATTUNE ×2 IMPLANT
CEMENT HV SMART SET (Cement) ×4 IMPLANT
COVER SURGICAL LIGHT HANDLE (MISCELLANEOUS) ×2 IMPLANT
CUFF TOURN SGL QUICK 34 (TOURNIQUET CUFF) ×1
CUFF TRNQT CYL 34X4X40X1 (TOURNIQUET CUFF) ×1 IMPLANT
DECANTER SPIKE VIAL GLASS SM (MISCELLANEOUS) ×2 IMPLANT
DERMABOND ADVANCED (GAUZE/BANDAGES/DRESSINGS) ×1
DERMABOND ADVANCED .7 DNX12 (GAUZE/BANDAGES/DRESSINGS) ×1 IMPLANT
DRAPE U-SHAPE 47X51 STRL (DRAPES) ×2 IMPLANT
DRESSING AQUACEL AG SP 3.5X10 (GAUZE/BANDAGES/DRESSINGS) ×1 IMPLANT
DRSG AQUACEL AG SP 3.5X10 (GAUZE/BANDAGES/DRESSINGS) ×2
DURAPREP 26ML APPLICATOR (WOUND CARE) ×4 IMPLANT
ELECT REM PT RETURN 15FT ADLT (MISCELLANEOUS) ×2 IMPLANT
GLOVE BIOGEL M 7.0 STRL (GLOVE) ×4 IMPLANT
GLOVE BIOGEL PI IND STRL 7.5 (GLOVE) ×7 IMPLANT
GLOVE BIOGEL PI IND STRL 8.5 (GLOVE) IMPLANT
GLOVE BIOGEL PI INDICATOR 7.5 (GLOVE) ×7
GLOVE BIOGEL PI INDICATOR 8.5 (GLOVE)
GLOVE ECLIPSE 8.0 STRL XLNG CF (GLOVE) ×2 IMPLANT
GLOVE ORTHO TXT STRL SZ7.5 (GLOVE) ×2 IMPLANT
GOWN STRL REUS W/TWL LRG LVL3 (GOWN DISPOSABLE) ×2 IMPLANT
GOWN STRL REUS W/TWL XL LVL3 (GOWN DISPOSABLE) ×2 IMPLANT
HANDPIECE INTERPULSE COAX TIP (DISPOSABLE) ×1
MANIFOLD NEPTUNE II (INSTRUMENTS) ×2 IMPLANT
PACK TOTAL KNEE CUSTOM (KITS) ×2 IMPLANT
POSITIONER SURGICAL ARM (MISCELLANEOUS) ×2 IMPLANT
SET HNDPC FAN SPRY TIP SCT (DISPOSABLE) ×1 IMPLANT
SET PAD KNEE POSITIONER (MISCELLANEOUS) ×2 IMPLANT
SUT MNCRL AB 4-0 PS2 18 (SUTURE) ×2 IMPLANT
SUT STRATAFIX 0 PDS 27 VIOLET (SUTURE) ×2
SUT VIC AB 1 CT1 36 (SUTURE) ×2 IMPLANT
SUT VIC AB 2-0 CT1 27 (SUTURE) ×3
SUT VIC AB 2-0 CT1 TAPERPNT 27 (SUTURE) ×3 IMPLANT
SUTURE STRATFX 0 PDS 27 VIOLET (SUTURE) ×1 IMPLANT
SYR 50ML LL SCALE MARK (SYRINGE) ×2 IMPLANT
TRAY FOLEY W/METER SILVER 16FR (SET/KITS/TRAYS/PACK) ×2 IMPLANT
WATER STERILE IRR 1500ML POUR (IV SOLUTION) ×2 IMPLANT
WRAP KNEE MAXI GEL POST OP (GAUZE/BANDAGES/DRESSINGS) ×2 IMPLANT
YANKAUER SUCT BULB TIP 10FT TU (MISCELLANEOUS) ×2 IMPLANT

## 2017-05-04 NOTE — Anesthesia Procedure Notes (Signed)
Spinal  Patient location during procedure: OR Start time: 05/04/2017 11:11 AM End time: 05/04/2017 11:18 AM Reason for block: at surgeon's request Staffing Resident/CRNA: Anne Fu Performed: resident/CRNA  Preanesthetic Checklist Completed: patient identified, site marked, surgical consent, pre-op evaluation, timeout performed, IV checked, risks and benefits discussed and monitors and equipment checked Spinal Block Patient position: sitting Prep: DuraPrep Patient monitoring: heart rate, continuous pulse ox and blood pressure Approach: right paramedian Location: L2-3 Injection technique: single-shot Needle Needle type: Pencan  Needle gauge: 24 G Needle length: 9 cm Assessment Sensory level: T6 Additional Notes Expiration date of kit checked and confirmed. Patient tolerated procedure well, without complications. X 1 attempt with noted clear CSF return. Loss of motor and sensory on exam post injection.

## 2017-05-04 NOTE — Discharge Instructions (Signed)

## 2017-05-04 NOTE — Op Note (Signed)
NAME:  [name] RECORD NO.:  [id number]                             FACILITY:  [facility]      PHYSICIAN:  Pietro Cassis. Alvan Dame, M.D.  DATE OF BIRTH:  1933/11/14      DATE OF PROCEDURE:  05/04/2017                                     OPERATIVE REPORT         PREOPERATIVE DIAGNOSIS:  Left knee osteoarthritis.      POSTOPERATIVE DIAGNOSIS:  Left knee osteoarthritis.      FINDINGS:  The patient was noted to have complete loss of cartilage and   bone-on-bone arthritis with associated osteophytes in the medial and patellofemoral compartments of   the knee with a significant synovitis and associated effusion.      PROCEDURE:  Left total knee replacement.      COMPONENTS USED:  DePuy Attune rotating platform posterior stabilized knee   system, a size 3N femur, 3 tibia, size 7 PS AOX insert, and 35 anatomic patellar   button.      SURGEON:  Pietro Cassis. Alvan Dame, M.D.      ASSISTANT:  Nehemiah Massed, PA-C.      ANESTHESIA:  Regional and Spinal.      SPECIMENS:  None.      COMPLICATION:  None.      DRAINS:  None.  EBL: <100cc      TOURNIQUET TIME:   Total Tourniquet Time Documented: Thigh (Left) - 26 minutes Total: Thigh (Left) - 26 minutes  .      The patient was stable to the recovery room.      INDICATION FOR PROCEDURE:  Barbara Kelley is a 83 y.o. female patient of   mine.  The patient had been seen, evaluated, and treated conservatively in the   office with medication, activity modification, and injections.  The patient had   radiographic changes of bone-on-bone arthritis with endplate sclerosis and osteophytes noted.      The patient failed conservative measures including medication, injections, and activity modification, and at this point was ready for more definitive measures.   Based on the radiographic changes and failed conservative measures, the patient   decided to proceed with total knee replacement.  Risks of infection,   DVT, component  failure, need for revision surgery, postop course, and   expectations were all   discussed and reviewed.  Consent was obtained for benefit of pain   relief.      PROCEDURE IN DETAIL:  The patient was brought to the operative theater.   Once adequate anesthesia, preoperative antibiotics, 2 gm of Ancef, 1 gm of Tranexamic Acid, and 10 mg of Decadron administered, the patient was positioned supine with the left thigh tourniquet placed.  The  left lower extremity was prepped and draped in sterile fashion.  A time-   out was performed identifying the patient, planned procedure, and   extremity.      The left lower extremity was placed in the [facility] leg holder.  The leg was   exsanguinated, tourniquet elevated to 250 mmHg.  A midline incision was  made followed by median parapatellar arthrotomy.  Following initial   exposure, attention was first directed to the patella.  Precut   measurement was noted to be 24 mm.  I resected down to 14 mm and used a   35 anatomic patellar button to restore patellar height as well as cover the cut   surface.      The lug holes were drilled and a metal shim was placed to protect the   patella from retractors and saw blades.      At this point, attention was now directed to the femur.  The femoral   canal was opened with a drill, irrigated to try to prevent fat emboli.  An   intramedullary rod was passed at 3 degrees valgus, 9 mm of bone was   resected off the distal femur.  Following this resection, the tibia was   subluxated anteriorly.  Using the extramedullary guide, 2 mm of bone was resected off   the proximal medial tibia.  We confirmed the gap would be   stable medially and laterally with a size 5 spacer block as well as confirmed   the cut was perpendicular in the coronal plane, checking with an alignment rod.      Once this was done, I sized the femur to be a size 3 in the anterior-   posterior dimension, chose a narrow component based on medial and    lateral dimension.  The size 3 rotation block was then pinned in   position anterior referenced using the C-clamp to set rotation.  The   anterior, posterior, and  chamfer cuts were made without difficulty nor   notching making certain that I was along the anterior cortex to help   with flexion gap stability.      The final box cut was made off the lateral aspect of distal femur.      At this point, the tibia was sized to be a size 3, the size 3 tray was   then pinned in position through the medial third of the tubercle,   drilled, and keel punched.  Trial reduction was now carried with a 3 femur,  3 tibia, a size 6 then 7 PS insert, and the 35 anatomic patella botton.  The knee was brought to   extension, full extension with good flexion stability with the patella   tracking through the trochlea without application of pressure.  Given   all these findings the femoral lug holes were drilled and then the trial components removed.  Final components were   opened and cement was mixed.  The knee was irrigated with normal saline   solution and pulse lavage.  The synovial lining was   then injected with 30 cc of 0.25% Marcaine with epinephrine and 1 cc of Toradol plus 30 cc of NS for a    total of 61 cc.      The knee was irrigated.  Final implants were then cemented onto clean and   dried cut surfaces of bone with the knee brought to extension with a size 7 PS trial insert.      Once the cement had fully cured, the excess cement was removed   throughout the knee.  I confirmed I was satisfied with the range of   motion and stability, and the final size 7 PS AOX mm insert was chosen.  It was   placed into the knee.      The tourniquet had been  let down at 26 minutes.  No significant   hemostasis required.  The   extensor mechanism was then reapproximated using #1 Vicryl and #0 V-lock sutures with the knee   in flexion.  The   remaining wound was closed with 2-0 Vicryl and running 4-0  Monocryl.   The knee was cleaned, dried, dressed sterilely using Dermabond and   Aquacel dressing.  The patient was then   brought to recovery room in stable condition, tolerating the procedure   well.   Please note that Physician Assistant, Nehemiah Massed, PA-C, was present for the entirety of the case, and was utilized for pre-operative positioning, peri-operative retractor management, general facilitation of the procedure.  He was also utilized for primary wound closure at the end of the case.              Pietro Cassis Alvan Dame, M.D.    05/04/2017 12:35 PM

## 2017-05-04 NOTE — Anesthesia Procedure Notes (Addendum)
Anesthesia Regional Block: Adductor canal block   Pre-Anesthetic Checklist: ,, timeout performed, Correct Patient, Correct Site, Correct Laterality, Correct Procedure, Correct Position, site marked, Risks and benefits discussed,  Surgical consent,  Pre-op evaluation,  At surgeon's request and post-op pain management  Laterality: Left  Prep: chloraprep       Needles:  Injection technique: Single-shot  Needle Type: Echogenic Stimulator Needle      Needle Gauge: 21     Additional Needles:   Procedures: ultrasound guided,,,,,,,,  Narrative:  Start time: 05/04/2017 10:35 AM End time: 05/04/2017 10:40 AM Injection made incrementally with aspirations every 5 mL.  Performed by: Personally  Anesthesiologist: Drucilla Cumber  Additional Notes: 30 cc 0.5% Bupivacaine with 1:200 epi injected easily

## 2017-05-04 NOTE — Anesthesia Preprocedure Evaluation (Signed)
Anesthesia Evaluation  Patient identified by MRN, date of birth, ID band Patient awake    Reviewed: Allergy & Precautions, NPO status , Patient's Chart, lab work & pertinent test results  Airway Mallampati: II  TM Distance: >3 FB Neck ROM: Full    Dental  (+) Teeth Intact, Dental Advisory Given   Pulmonary    breath sounds clear to auscultation       Cardiovascular hypertension,  Rhythm:Regular Rate:Normal     Neuro/Psych    GI/Hepatic   Endo/Other    Renal/GU      Musculoskeletal   Abdominal   Peds  Hematology   Anesthesia Other Findings   Reproductive/Obstetrics                             Anesthesia Physical Anesthesia Plan  ASA: III  Anesthesia Plan: MAC and Spinal   Post-op Pain Management:    Induction:   PONV Risk Score and Plan: Ondansetron  Airway Management Planned: Natural Airway and Simple Face Mask  Additional Equipment:   Intra-op Plan:   Post-operative Plan:   Informed Consent: I have reviewed the patients History and Physical, chart, labs and discussed the procedure including the risks, benefits and alternatives for the proposed anesthesia with the patient or authorized representative who has indicated his/her understanding and acceptance.   Dental advisory given  Plan Discussed with: CRNA and Anesthesiologist  Anesthesia Plan Comments:         Anesthesia Quick Evaluation

## 2017-05-04 NOTE — Interval H&P Note (Signed)
History and Physical Interval Note:  05/04/2017 10:03 AM  Barbara Kelley  has presented today for surgery, with the diagnosis of Left knee osteoarthritis  The various methods of treatment have been discussed with the patient and family. After consideration of risks, benefits and other options for treatment, the patient has consented to  Procedure(s) with comments: LEFT TOTAL KNEE ARTHROPLASTY (Left) - 70 mins as a surgical intervention .  The patient's history has been reviewed, patient examined, no change in status, stable for surgery.  I have reviewed the patient's chart and labs.  Questions were answered to the patient's satisfaction.     Mauri Pole

## 2017-05-04 NOTE — H&P (Deleted)
CSW consulted for SNF placement. Pt is from [place] at [facility]. PT eval is pending. CSW will review PT recommendations and assist with SNF placement if appropriate.  Werner Lean LCSW [phone]

## 2017-05-04 NOTE — Transfer of Care (Signed)
Immediate Anesthesia Transfer of Care Note  Patient: Barbara Kelley  Procedure(s) Performed: Procedure(s) with comments: LEFT TOTAL KNEE ARTHROPLASTY (Left) - 70 mins  Patient Location: PACU  Anesthesia Type:Spinal  Level of Consciousness: awake, alert  and oriented  Airway & Oxygen Therapy: Patient Spontanous Breathing and Patient connected to face mask oxygen  Post-op Assessment: Report given to RN and Post -op Vital signs reviewed and stable  Post vital signs: Reviewed and stable  Last Vitals:  Vitals:   05/04/17 1045 05/04/17 1100  BP: (!) 177/98 (!) 169/88  Pulse: 72 68  Resp: (!) 22 19    Last Pain:  Vitals:   05/04/17 0957  PainSc: 6          Complications: No apparent anesthesia complications

## 2017-05-04 NOTE — Anesthesia Procedure Notes (Signed)
Date/Time: 05/04/2017 11:09 AM Performed by: Danley Danker L Oxygen Delivery Method: Simple face mask

## 2017-05-04 NOTE — Progress Notes (Signed)
AssistedDr. Joslin with left, ultrasound guided, adductor canal block. Side rails up, monitors on throughout procedure. See vital signs in flow sheet. Tolerated Procedure well.  

## 2017-05-05 ENCOUNTER — Encounter (HOSPITAL_COMMUNITY): Payer: Self-pay | Admitting: Orthopedic Surgery

## 2017-05-05 LAB — BASIC METABOLIC PANEL
Anion gap: 9 (ref 5–15)
BUN: 9 mg/dL (ref 6–20)
CALCIUM: 8.6 mg/dL — AB (ref 8.9–10.3)
CO2: 26 mmol/L (ref 22–32)
Chloride: 106 mmol/L (ref 101–111)
Creatinine, Ser: 0.49 mg/dL (ref 0.44–1.00)
GFR calc Af Amer: >60 mL/min (ref >60)
GLUCOSE: 139 mg/dL — AB (ref 65–99)
Potassium: 3.7 mmol/L (ref 3.5–5.1)
Sodium: 141 mmol/L (ref 135–145)

## 2017-05-05 LAB — CBC
HCT: 33.8 % — ABNORMAL LOW (ref 36.0–46.0)
Hemoglobin: 11.3 g/dL — ABNORMAL LOW (ref 12.0–15.0)
MCH: 28.5 pg (ref 26.0–34.0)
MCHC: 33.4 g/dL (ref 30.0–36.0)
MCV: 85.1 fL (ref 78.0–100.0)
PLATELETS: 168 10*3/uL (ref 150–400)
RBC: 3.97 MIL/uL (ref 3.87–5.11)
RDW: 12.9 % (ref 11.5–15.5)
WBC: 15 10*3/uL — AB (ref 4.0–10.5)

## 2017-05-05 NOTE — Progress Notes (Signed)
Plan for d/c to SNF, discharge planning per CSW. [phone] 

## 2017-05-05 NOTE — Clinical Social Work Note (Signed)
Clinical Social Work Assessment  Patient Details  Name: Barbara Kelley MRN: [id number] Date of Birth: [birth year]  Date of referral:  05/05/17               Reason for consult:  Discharge Planning                Permission sought to share information with:  Chartered certified accountant granted to share information::  Yes, Verbal Permission Granted  Name::        Agency::     Relationship::     Contact Information:     Housing/Transportation Living arrangements for the past 2 months:  Single Family Home Source of Information:  Patient Patient Interpreter Needed:  None Criminal Activity/Legal Involvement Pertinent to Current Situation/Hospitalization:  No - Comment as needed Significant Relationships:  Adult Children Lives with:  Self Do you feel safe going back to the place where you live?   (SNF recommended) Need for family participation in patient care:  No (Coment)  Care giving concerns:  Pt requires more care than is available at home following hospital dc.   Social Worker assessment / plan:  Pt hospitalized on 05/04/17 from home for pre planned left total knee arthroplasty. CSW met with pt at bedside to assist with dc planning. Pt requesting ST Rehab at [facility] [place]. Permission provided to initiate SNF search. CSW will contact [facility] to request ST Rehab placement. Bed offers will be provided as received. CSW will continue to follow to assist with dc planning .  Employment status:  Retired Nurse, adult PT Recommendations:  [place] / Referral to community resources:  [place]  Patient/Family's Response to care:  Pt feels ST Rehab is needed.  Patient/Family's Understanding of and Emotional Response to Diagnosis, Current Treatment, and Prognosis:  Pt is aware of her medical status. She is motivated to work with therapy and is hopeful [facility] will have an opening for her at  dc.  Emotional Assessment Appearance:  Appears stated age Attitude/Demeanor/Rapport:    Affect (typically observed):  Appropriate, Calm, Pleasant Orientation:  Oriented to Self, Oriented to Place, Oriented to  Time, Oriented to Situation Alcohol / Substance use:  Not Applicable Psych involvement (Current and /or in the community):  No (Comment)  Discharge Needs  Concerns to be addressed:  Discharge Planning Concerns Readmission within the last 30 days:  No Current discharge risk:  None Barriers to Discharge:  No Barriers Identified   Barbara Kelley, [place] 05/05/2017, 9:05 AM

## 2017-05-05 NOTE — Progress Notes (Signed)
CSW consulted for SNF placement. Pt is from [place] at [facility]. PT eval is pending. CSW will review PT recommendations and assist with SNF placement if appropriate.  Werner Lean LCSW [phone]

## 2017-05-05 NOTE — Progress Notes (Signed)
Physical Therapy Treatment Patient Details Name: Barbara Kelley MRN: [id number] DOB: [birth year] Today's Date: 05/05/2017    History of Present Illness s/p L TKA and with hx of CVA, CHF, L THR and cardiomyopathy    PT Comments    Pt very motivated and progressing well with mobility.   Follow Up Recommendations  SNF     Equipment Recommendations  Rolling walker with 5" wheels    Recommendations for Other Services OT consult     Precautions / Restrictions Precautions Precautions: Knee;Fall Restrictions Weight Bearing Restrictions: No Other Position/Activity Restrictions: WBAT    Mobility  Bed Mobility Overal bed mobility: Needs Assistance Bed Mobility: Supine to Sit;Sit to Supine     Supine to sit: Min assist Sit to supine: Min assist   General bed mobility comments: cues for sequence and use of R LE to self assist  Transfers Overall transfer level: Needs assistance Equipment used: Rolling walker (2 wheeled) Transfers: Sit to/from Stand Sit to Stand: Min assist         General transfer comment: assist to rise and steady. Cues for UE/LE placement  Ambulation/Gait Ambulation/Gait assistance: Min assist Ambulation Distance (Feet): 70 Feet (twice) Assistive device: Rolling walker (2 wheeled) Gait Pattern/deviations: Step-to pattern;Decreased step length - right;Decreased step length - left;Shuffle;Trunk flexed Gait velocity: decr Gait velocity interpretation: Below normal speed for age/gender General Gait Details: cues for sequence, posture and position from [employer]            Wheelchair Mobility    Modified Rankin (Stroke Patients Only)       Balance                                            Cognition Arousal/Alertness: Awake/alert Behavior During Therapy: WFL for tasks assessed/performed Overall Cognitive Status: Within Functional Limits for tasks assessed                                         Exercises Total Joint Exercises Ankle Circles/Pumps: AROM;Both;15 reps;Supine Quad Sets: AROM;Both;10 reps;Supine Heel Slides: AAROM;Left;15 reps;Supine Hip ABduction/ADduction: AAROM;Left;10 reps;Supine Straight Leg Raises: AAROM;AROM;Left;10 reps;Supine    General Comments        Pertinent Vitals/Pain Pain Assessment: 0-10 Pain Score: 4  Pain Location: L knee Pain Descriptors / Indicators: Aching;Sore Pain Intervention(s): Limited activity within patient's tolerance;Monitored during session;Premedicated before session;Ice applied    Home Living Family/patient expects to be discharged to:: Skilled nursing facility                    Prior Function Level of Independence: Independent with assistive device(s)      Comments: from [place] independent living   PT Goals (current goals can now be found in the care plan section) Acute Rehab PT Goals Patient Stated Goal: rehab to help get her back to being independent PT Goal Formulation: With patient Time For Goal Achievement: 05/15/17 Potential to Achieve Goals: Good Progress towards PT goals: Progressing toward goals    Frequency    7X/week      PT Plan Current plan remains appropriate    Co-evaluation              AM-PAC PT "6 Clicks" Daily Activity  Outcome Measure  Difficulty turning over in  bed (including adjusting bedclothes, sheets and blankets)?: Total Difficulty moving from lying on back to sitting on the side of the bed? : Total Difficulty sitting down on and standing up from a chair with arms (e.g., wheelchair, bedside commode, etc,.)?: Total Help needed moving to and from a bed to chair (including a wheelchair)?: A Little Help needed walking in hospital room?: A Little Help needed climbing 3-5 steps with a railing? : A Little 6 Click Score: 12    End of Session Equipment Utilized During Treatment: Gait belt Activity Tolerance: Patient tolerated treatment well Patient left: in  bed;with call bell/phone within reach Nurse Communication: Mobility status PT Visit Diagnosis: Unsteadiness on feet (R26.81);Difficulty in walking, not elsewhere classified (R26.2)     Time: 1219-7588 PT Time Calculation (min) (ACUTE ONLY): 28 min  Charges:  $Gait Training: 8-22 mins $Therapeutic Exercise: 8-22 mins                    G Codes:       Pg [phone]    Katrina Daddona 05/05/2017, 3:32 PM

## 2017-05-05 NOTE — Progress Notes (Signed)
     Subjective: 1 Day Post-Op Procedure(s) (LRB): LEFT TOTAL KNEE ARTHROPLASTY (Left)   Patient reports pain as mild, pain controlled. No events throughout the night.  Planning on SNF upon discharge.  Objective:   VITALS:   Vitals:   05/05/17 0510 05/05/17 0818  BP: (!) 153/73 (!) 164/65  Pulse: 72 (!) 55  Resp: 16   Temp: 98.3 F (36.8 C)     Dorsiflexion/Plantar flexion intact Incision: dressing C/D/I No cellulitis present Compartment soft  LABS  Recent Labs  05/05/17 0719  HGB 11.3*  HCT 33.8*  WBC 15.0*  PLT 168     Recent Labs  05/05/17 0719  NA 141  K 3.7  BUN 9  CREATININE 0.49  GLUCOSE 139*     Assessment/Plan: 1 Day Post-Op Procedure(s) (LRB): LEFT TOTAL KNEE ARTHROPLASTY (Left) Foley cath d/c'ed Advance diet Up with therapy D/C IV fluids Discharge to SNF when ready    [place] Pugh. Makaylia Hewett   PAC  05/05/2017, 9:00 AM

## 2017-05-05 NOTE — NC FL2 (Signed)
[place] LEVEL OF CARE SCREENING TOOL     IDENTIFICATION  Patient Name: Barbara Kelley Birthdate: 1933-11-03 Sex: female Admission Date (Current Location): 05/04/2017  [facility] and [place] Number:  Herbalist and Address:  [facility],  [place] [address], [place]      Provider Number: [id number]  Attending Physician Name and Address:  Paralee Cancel, MD  Relative Name and Phone Number:       Current Level of Care: Hospital Recommended Level of Care: [place] Prior Approval Number:    Date Approved/Denied:   PASRR Number: [id number]  Discharge Plan: SNF    Current Diagnoses: Patient Active Problem List   Diagnosis Date Noted  . S/P left TKA 05/04/2017  . S/P total knee replacement 05/04/2017    Orientation RESPIRATION BLADDER Height & Weight     Self, Time, Situation, Place  Normal Indwelling catheter Weight: 59.4 kg (131 lb) Height:  4\' 11"  (149.9 cm)  BEHAVIORAL SYMPTOMS/MOOD NEUROLOGICAL BOWEL NUTRITION STATUS  Other (Comment) (no behaviors)   Continent Diet  AMBULATORY STATUS COMMUNICATION OF NEEDS Skin   Limited Assist Verbally Surgical wounds                       Personal Care Assistance Level of Assistance  Bathing, Feeding, Dressing Bathing Assistance: Limited assistance Feeding assistance: Independent Dressing Assistance: Limited assistance     Functional Limitations Info  Sight, Hearing, Speech Sight Info: Adequate Hearing Info: Adequate Speech Info: Adequate    SPECIAL CARE FACTORS FREQUENCY  PT (By licensed PT), OT (By licensed OT)     PT Frequency: 5x wk OT Frequency: 5x wk            Contractures Contractures Info: Not present    Additional Factors Info  Code Status, Allergies Code Status Info: Full Code Allergies Info: Doxycycline, Macrobid Nitrofurantoin, Valsartan, Lisinopril, Calcium Channel Blockers, Penicillins           Current Medications  (05/05/2017):  This is the current hospital active medication list Current Facility-Administered Medications  Medication Dose Route Frequency Provider Last Rate Last Dose  . 0.9 %  sodium chloride infusion   Intravenous Continuous Danae Orleans, PA-C 100 mL/hr at 05/05/17 0442    . alum & mag hydroxide-simeth (MAALOX/MYLANTA) 200-200-20 MG/5ML suspension 15 mL  15 mL Oral Q4H PRN Danae Orleans, PA-C      . aspirin chewable tablet 81 mg  81 mg Oral BID Danae Orleans, PA-C   81 mg at 05/05/17 0818  . bisacodyl (DULCOLAX) suppository 10 mg  10 mg Rectal Daily PRN Babish, Matthew, PA-C      . carvedilol (COREG) tablet 3.125 mg  3.125 mg Oral BID WC Danae Orleans, PA-C   3.125 mg at 05/05/17 0818  . celecoxib (CELEBREX) capsule 200 mg  200 mg Oral Q12H Danae Orleans, PA-C   200 mg at 05/04/17 2224  . dexamethasone (DECADRON) injection 10 mg  10 mg Intravenous Once Babish, Matthew, PA-C      . diphenhydrAMINE (BENADRYL) capsule 25 mg  25 mg Oral Q6H PRN Babish, Matthew, PA-C      . docusate sodium (COLACE) capsule 100 mg  100 mg Oral BID Babish, Matthew, PA-C   100 mg at 05/04/17 2224  . fentaNYL (SUBLIMAZE) injection 25-50 mcg  25-50 mcg Intravenous Q2H PRN Danae Orleans, PA-C   50 mcg at 05/04/17 2330  . ferrous sulfate tablet 325 mg  325 mg Oral TID  PC Danae Orleans, PA-C   325 mg at 05/04/17 1926  . furosemide (LASIX) tablet 20 mg  20 mg Oral Daily Danae Orleans, PA-C   20 mg at 05/04/17 1754  . HYDROcodone-acetaminophen (NORCO) 7.5-325 MG per tablet 1-2 tablet  1-2 tablet Oral Q4H Danae Orleans, PA-C   1 tablet at 05/05/17 0606  . magnesium citrate solution 1 Bottle  1 Bottle Oral Once PRN Babish, Matthew, PA-C      . menthol-cetylpyridinium (CEPACOL) lozenge 3 mg  1 lozenge Oral PRN Danae Orleans, PA-C       Or  . phenol (CHLORASEPTIC) mouth spray 1 spray  1 spray Mouth/Throat PRN Babish, Rodman Key, PA-C      . methocarbamol (ROBAXIN) tablet 500 mg  500 mg Oral Q6H PRN Danae Orleans, PA-C   500 mg at 05/05/17 0606   Or  . methocarbamol (ROBAXIN) 500 mg in dextrose 5 % 50 mL IVPB  500 mg Intravenous Q6H PRN Babish, Matthew, PA-C      . metoCLOPramide (REGLAN) tablet 5-10 mg  5-10 mg Oral Q8H PRN Danae Orleans, PA-C       Or  . metoCLOPramide (REGLAN) injection 5-10 mg  5-10 mg Intravenous Q8H PRN Babish, Rodman Key, PA-C      . ondansetron (ZOFRAN) tablet 4 mg  4 mg Oral Q6H PRN Danae Orleans, PA-C       Or  . ondansetron (ZOFRAN) injection 4 mg  4 mg Intravenous Q6H PRN Babish, Matthew, PA-C      . polyethylene glycol (MIRALAX / GLYCOLAX) packet 17 g  17 g Oral BID Danae Orleans, PA-C   17 g at 05/04/17 2224     Discharge Medications: Please see discharge summary for a list of discharge medications.  Relevant Imaging Results:  Relevant Lab Results:   Additional Information SS # [id number]  Hendrix Console, Randall An, LCSW

## 2017-05-05 NOTE — Evaluation (Signed)
Physical Therapy Evaluation Patient Details Name: Barbara Kelley MRN: [id number] DOB: 1934/07/14 Today's Date: 05/05/2017   History of Present Illness  s/p L TKA and with hx of CVA, CHF, L THR and cardiomyopathy  Clinical Impression  Pt s/p L TKR and presents with decreased L LE strength/ROM and post op pain limiting functional mobility.  Pt would benefit from follow up rehab at SNF level to maximize IND and safety prior to return home with ltd assist.    Follow Up Recommendations SNF    Equipment Recommendations  Rolling walker with 5" wheels    Recommendations for Other Services OT consult     Precautions / Restrictions Precautions Precautions: Knee;Fall Restrictions Weight Bearing Restrictions: No Other Position/Activity Restrictions: WBAT      Mobility  Bed Mobility Overal bed mobility: Needs Assistance Bed Mobility: Supine to Sit     Supine to sit: Min assist     General bed mobility comments: up with OT  Transfers Overall transfer level: Needs assistance Equipment used: Rolling walker (2 wheeled) Transfers: Sit to/from Stand Sit to Stand: Min assist Stand pivot transfers: Min assist       General transfer comment: assist to rise and steady. Cues for UE/LE placement  Ambulation/Gait Ambulation/Gait assistance: Min assist Ambulation Distance (Feet): 52 Feet Assistive device: Rolling walker (2 wheeled) Gait Pattern/deviations: Step-to pattern;Decreased step length - right;Decreased step length - left;Shuffle;Trunk flexed Gait velocity: decr Gait velocity interpretation: Below normal speed for age/gender General Gait Details: cues for sequence, posture and position from [employer]            Wheelchair Mobility    Modified Rankin (Stroke Patients Only)       Balance                                             Pertinent Vitals/Pain Pain Assessment: 0-10 Pain Score: 4  Faces Pain Scale: Hurts little more Pain Location:  L knee Pain Descriptors / Indicators: Aching;Sore Pain Intervention(s): Limited activity within patient's tolerance;Monitored during session;Premedicated before session;Ice applied    Home Living Family/patient expects to be discharged to:: Skilled nursing facility Living Arrangements: Other (Comment)                    Prior Function Level of Independence: Independent with assistive device(s)         Comments: from [place] independent [facility]     Hand Dominance        Extremity/Trunk Assessment   Upper Extremity Assessment Upper Extremity Assessment: Overall WFL for tasks assessed    Lower Extremity Assessment Lower Extremity Assessment: LLE deficits/detail LLE Deficits / Details: 3-/5 quads with AAROM at knee -10- 90       Communication   Communication: No difficulties  Cognition Arousal/Alertness: Awake/alert Behavior During Therapy: WFL for tasks assessed/performed Overall Cognitive Status: Within Functional Limits for tasks assessed                                        General Comments      Exercises Total Joint Exercises Ankle Circles/Pumps: AROM;Both;15 reps;Supine Quad Sets: AROM;Both;10 reps;Supine Heel Slides: AAROM;Left;15 reps;Supine Hip ABduction/ADduction: AAROM;Left;10 reps;Supine   Assessment/Plan    PT Assessment Patient needs continued PT services  PT Problem List  Decreased strength;Decreased range of motion;Decreased activity tolerance;Decreased mobility;Decreased knowledge of use of DME;Pain       PT Treatment Interventions DME instruction;Gait training;Stair training;Functional mobility training;Therapeutic exercise;Therapeutic activities;Patient/family education    PT Goals (Current goals can be found in the Care Plan section)  Acute Rehab PT Goals Patient Stated Goal: rehab to help get her back to being independent PT Goal Formulation: With patient Time For Goal Achievement: 05/15/17 Potential to  Achieve Goals: Good    Frequency 7X/week   Barriers to discharge        Co-evaluation               AM-PAC PT "6 Clicks" Daily Activity  Outcome Measure Difficulty turning over in bed (including adjusting bedclothes, sheets and blankets)?: Total Difficulty moving from lying on back to sitting on the side of the bed? : Total Difficulty sitting down on and standing up from a chair with arms (e.g., wheelchair, bedside commode, etc,.)?: Total Help needed moving to and from a bed to chair (including a wheelchair)?: A Little Help needed walking in hospital room?: A Little Help needed climbing 3-5 steps with a railing? : A Little 6 Click Score: 12    End of Session Equipment Utilized During Treatment: Gait belt Activity Tolerance: Patient tolerated treatment well Patient left: in chair;with call bell/phone within reach;with family/visitor present Nurse Communication: Mobility status PT Visit Diagnosis: Unsteadiness on feet (R26.81);Difficulty in walking, not elsewhere classified (R26.2)    Time: 4503-8882 PT Time Calculation (min) (ACUTE ONLY): 32 min   Charges:   PT Evaluation $PT Eval Low Complexity: 1 Low PT Treatments $Therapeutic Exercise: 8-22 mins   PT G Codes:        Pg [phone]   Halea Lieb 05/05/2017, 12:33 PM

## 2017-05-05 NOTE — Anesthesia Postprocedure Evaluation (Signed)
Anesthesia Post Note  Patient: Barbara Kelley  Procedure(s) Performed: Procedure(s) (LRB): LEFT TOTAL KNEE ARTHROPLASTY (Left)     Patient location during evaluation: PACU Anesthesia Type: MAC and Spinal Level of consciousness: awake, awake and alert and oriented Pain management: pain level controlled Vital Signs Assessment: post-procedure vital signs reviewed and stable Respiratory status: spontaneous breathing, nonlabored ventilation and respiratory function stable Cardiovascular status: blood pressure returned to baseline Postop Assessment: spinal receding Anesthetic complications: no    Last Vitals:  Vitals:   05/05/17 1312 05/05/17 1747  BP: (!) 162/69 (!) 168/78  Pulse: 76 71  Resp: 16   Temp: 36.7 C     Last Pain:  Vitals:   05/05/17 1911  TempSrc:   PainSc: 4                  Shyniece Scripter COKER

## 2017-05-05 NOTE — Evaluation (Signed)
Occupational Therapy Evaluation Patient Details Name: Barbara Kelley MRN: [id number] DOB: 09/23/1934 Today's Date: 05/05/2017    History of Present Illness s/p L TKA   Clinical Impression   This 83 year old female was admitted for the above sx. She was mod I with adls prior to admission and needs up to max A for LB dressing. She will benefit from continued OT in acute as well as follow up OT to resume independent living. Goals are for supervision to min A in acute setting    Follow Up Recommendations    SNF   Equipment Recommendations    none by OT   Recommendations for Other Services       Precautions / Restrictions Precautions Precautions: Knee;Fall Restrictions Other Position/Activity Restrictions: WBAT      Mobility Bed Mobility Overal bed mobility: Needs Assistance Bed Mobility: Supine to Sit     Supine to sit: Min assist     General bed mobility comments: assist for LLE  Transfers Overall transfer level: Needs assistance Equipment used: Rolling walker (2 wheeled) Transfers: Sit to/from [facility] Sit to Stand: Min assist Stand pivot transfers: Min assist       General transfer comment: assist to rise and steady. Cues for UE/LE placement    Balance                                           ADL either performed or assessed with clinical judgement   ADL Overall ADL's : Needs assistance/impaired Eating/Feeding: Independent   Grooming: Oral care;Set up;Sitting   Upper Body Bathing: Set up;Sitting   Lower Body Bathing: Minimal assistance;Sit to/from stand   Upper Body Dressing : Set up;Sitting   Lower Body Dressing: Maximal assistance;Sit to/from stand   Toilet Transfer: Minimal assistance;Stand-pivot;RW Armed forces technical officer Details (indicate cue type and reason): to recliner Toileting- Clothing Manipulation and Hygiene: Minimal assistance;Sit to/from stand         General ADL Comments: performed ADL.  Pt felt a  little woozy:  BP WFLs.  Performed grooming from chair     Vision         Perception     Praxis      Pertinent Vitals/Pain Pain Assessment: Faces Faces Pain Scale: Hurts little more Pain Location: L knee Pain Descriptors / Indicators: Aching Pain Intervention(s): Limited activity within patient's tolerance;Monitored during session;Premedicated before session;Repositioned;Ice applied     Hand Dominance     Extremity/Trunk Assessment Upper Extremity Assessment Upper Extremity Assessment: Overall WFL for tasks assessed           Communication Communication Communication: No difficulties   Cognition Arousal/Alertness: Awake/alert Behavior During Therapy: WFL for tasks assessed/performed Overall Cognitive Status: Within Functional Limits for tasks assessed                                     General Comments       Exercises     Shoulder Instructions      Home Living Family/patient expects to be discharged to:: Group home Living Arrangements: Other (Comment)                               Additional Comments: high toilet and walk in shower. Pt has a seat.  Pt used a cane for ambulating      Prior Functioning/Environment Level of Independence: Independent with assistive device(s)        Comments: from [place] independent [facility]        OT Problem List:        OT Treatment/Interventions:      OT Goals(Current goals can be found in the care plan section) Acute Rehab OT Goals Patient Stated Goal: rehab to help get her back to being independent OT Goal Formulation: With patient Time For Goal Achievement: 05/12/17 Potential to Achieve Goals: Good ADL Goals Pt Will Perform Grooming: with supervision;standing Pt Will Perform Lower Body Bathing: with supervision;with adaptive equipment;sit to/from stand Pt Will Perform Lower Body Dressing: with min assist;with adaptive equipment;sit to/from stand Pt Will Transfer to  Toilet: with supervision;ambulating;bedside commode Pt Will Perform Toileting - Clothing Manipulation and hygiene: with supervision;sit to/from stand  OT Frequency:     Barriers to D/C:            Co-evaluation              AM-PAC PT "6 Clicks" Daily Activity     Outcome Measure Help from another person eating meals?: None Help from another person taking care of personal grooming?: A Little Help from another person toileting, which includes using toliet, bedpan, or urinal?: A Little Help from another person bathing (including washing, rinsing, drying)?: A Little Help from another person to put on and taking off regular upper body clothing?: A Little Help from another person to put on and taking off regular lower body clothing?: A Lot 6 Click Score: 18   End of Session    Activity Tolerance: Patient tolerated treatment well Patient left: in chair;with call bell/phone within reach                   Time: 5883-2549 OT Time Calculation (min): 34 min Charges:  OT General Charges $OT Visit: 1 Procedure OT Evaluation $OT Eval Low Complexity: 1 Procedure OT Treatments $Self Care/Home Management : 8-22 mins G-Codes:     [place], OTR/L [phone] 05/05/2017  Corran Lalone 05/05/2017, 9:38 AM

## 2017-05-06 LAB — BASIC METABOLIC PANEL
Anion gap: 5 (ref 5–15)
BUN: 13 mg/dL (ref 6–20)
CHLORIDE: 106 mmol/L (ref 101–111)
CO2: 29 mmol/L (ref 22–32)
CREATININE: 0.61 mg/dL (ref 0.44–1.00)
Calcium: 8.5 mg/dL — ABNORMAL LOW (ref 8.9–10.3)
GFR calc Af Amer: >60 mL/min (ref >60)
Glucose, Bld: 115 mg/dL — ABNORMAL HIGH (ref 65–99)
Potassium: 4.1 mmol/L (ref 3.5–5.1)
SODIUM: 140 mmol/L (ref 135–145)

## 2017-05-06 LAB — CBC
HCT: 32.2 % — ABNORMAL LOW (ref 36.0–46.0)
Hemoglobin: 10.7 g/dL — ABNORMAL LOW (ref 12.0–15.0)
MCH: 28.5 pg (ref 26.0–34.0)
MCHC: 33.2 g/dL (ref 30.0–36.0)
MCV: 85.6 fL (ref 78.0–100.0)
PLATELETS: 160 10*3/uL (ref 150–400)
RBC: 3.76 MIL/uL — ABNORMAL LOW (ref 3.87–5.11)
RDW: 13.3 % (ref 11.5–15.5)
WBC: 14.3 10*3/uL — ABNORMAL HIGH (ref 4.0–10.5)

## 2017-05-06 MED ORDER — METHOCARBAMOL 500 MG PO TABS: 500.0000 mg | ORAL_TABLET | Freq: Four times a day (QID) | ORAL | 0 refills | Status: DC | PRN

## 2017-05-06 MED ORDER — HYDROCODONE-ACETAMINOPHEN 7.5-325 MG PO TABS: 1.0000 | ORAL_TABLET | ORAL | 0 refills | Status: DC | PRN

## 2017-05-06 MED ORDER — FERROUS SULFATE 325 (65 FE) MG PO TABS: 325.0000 mg | ORAL_TABLET | Freq: Three times a day (TID) | ORAL | 3 refills | Status: DC

## 2017-05-06 MED ORDER — ASPIRIN 81 MG PO CHEW: 81.0000 mg | CHEWABLE_TABLET | Freq: Two times a day (BID) | ORAL | 0 refills | Status: AC

## 2017-05-06 MED ORDER — DOCUSATE SODIUM 100 MG PO CAPS: 100.0000 mg | ORAL_CAPSULE | Freq: Two times a day (BID) | ORAL | 0 refills | Status: DC

## 2017-05-06 MED ORDER — POLYETHYLENE GLYCOL 3350 17 G PO PACK: 17.0000 g | PACK | Freq: Two times a day (BID) | ORAL | 0 refills | Status: DC

## 2017-05-06 NOTE — Discharge Summary (Signed)
Physician Discharge Summary  Patient ID: Barbara Kelley MRN: [id number] DOB/AGE: [birth year] 81 y.o.  Admit date: 05/04/2017 Discharge date:  05/06/2017  Procedures:  Procedure(s) (LRB): LEFT TOTAL KNEE ARTHROPLASTY (Left)  Attending Physician:  Dr. Paralee Cancel   Admission Diagnoses:   Left knee primary OA / pain  Discharge Diagnoses:  Principal Problem:   S/P left TKA Active Problems:   S/P total knee replacement  Past Medical History:  Diagnosis Date  . Arthritis   . Cardiomyopathy ([place])   . Congestive heart failure (CHF) ([place])   . GERD (gastroesophageal reflux disease)    currently non problematic   . Hypertension   . 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 Pneumonia    x2 most recent fall of 2017   . PONV (postoperative nausea and vomiting)   . Stroke [facility]) 2008   deficit of neuroplathy in left hand and left foot     HPI:    Barbara Kelley, 83 y.o. female, has a history of pain and functional disability in the left knee due to arthritis and has failed non-surgical conservative treatments for greater than 12 weeks to include NSAID's and/or analgesics, corticosteriod injections, use of assistive devices and activity modification.  Onset of symptoms was gradual, starting 25+ years ago with gradually worsening course since that time. The patient noted no past surgery on the left knee(s).  Patient currently rates pain in the left knee(s) at 8 out of 10 with activity. Patient has worsening of pain with activity and weight bearing, pain that interferes with activities of daily living, pain with passive range of motion, crepitus and joint swelling.  Patient has evidence of periarticular osteophytes and joint space narrowing by imaging studies.  There is no active infection.  Risks, benefits and expectations were discussed with the patient.  Risks including but not limited to the risk of anesthesia, blood clots, nerve damage, blood vessel damage, failure of the  prosthesis, infection and up to and including death.  Patient understand the risks, benefits and expectations and wishes to proceed with surgery.   PCP: [name], MD   Discharged Condition: good  Hospital Course:  Patient underwent the above stated procedure on 05/04/2017. Patient tolerated the procedure well and brought to the recovery room in good condition and subsequently to the floor.  POD #1 BP: 164/65 ; Pulse: 55 ; Temp: 98.3 F (36.8 C) ; Resp: 16 Patient reports pain as mild, pain controlled. No events throughout the night.  Planning on SNF upon discharge. Dorsiflexion/plantar flexion intact, incision: dressing C/D/I, no cellulitis present and compartment soft.   LABS  Basename    HGB     11.3  HCT     33.8   POD #2  BP: 168/87 ; Pulse: 90 ; Temp: 98.2 F (36.8 C) ; Resp: 16 Patient reports pain as moderate.  "Finally got some sleep last night".  Doing better.  Plan for ST SNF discharge. Neurovascular intact and incision: dressing C/D/I.  LABS  Basename    HGB     10.7  HCT     32.2    Discharge Exam: General appearance: alert, cooperative and no distress Extremities: Homans sign is negative, no sign of DVT, no edema, redness or tenderness in the calves or thighs and no ulcers, gangrene or trophic changes  Disposition:   Skilled nursing facility with follow up in 2 weeks    Contact information for follow-up providers  Paralee Cancel, MD. Schedule an appointment as soon as possible for a visit in 2 week(s).   Specialty:  Orthopedic Surgery Contact information: [address] [place] [address] [phone]            Contact information for after-discharge care    Destination    [facility] Follow up.   Specialty:  [place] information: [place] [phone]                  Discharge Instructions    Call MD / Call 911     Complete by:  As directed    If you experience chest pain or shortness of breath, CALL 911 and be transported to the hospital emergency room.  If you develope a fever above 101 F, pus (white drainage) or increased drainage or redness at the wound, or calf pain, call your surgeon's office.   Change dressing    Complete by:  As directed    Maintain surgical dressing until follow up in the clinic. If the edges start to pull up, may reinforce with tape. If the dressing is no longer working, may remove and cover with gauze and tape, but must keep the area dry and clean.  Call with any questions or concerns.   Constipation Prevention    Complete by:  As directed    Drink plenty of fluids.  Prune juice may be helpful.  You may use a stool softener, such as Colace (over the counter) 100 mg twice a day.  Use MiraLax (over the counter) for constipation as needed.   Diet - low sodium heart healthy    Complete by:  As directed    Discharge instructions    Complete by:  As directed    Maintain surgical dressing until follow up in the clinic. If the edges start to pull up, may reinforce with tape. If the dressing is no longer working, may remove and cover with gauze and tape, but must keep the area dry and clean.  Follow up in 2 weeks at [facility]. Call with any questions or concerns.   Increase activity slowly as tolerated    Complete by:  As directed    Weight bearing as tolerated with assist device (walker, cane, etc) as directed, use it as long as suggested by your surgeon or therapist, typically at least 4-6 weeks.   TED hose    Complete by:  As directed    Use stockings (TED hose) for 2 weeks on both leg(s).  You may remove them at night for sleeping.      Allergies as of 05/06/2017      Reactions   Doxycycline Nausea Only   Vertigo   Macrobid [nitrofurantoin] Other (See Comments)   Nerve pain   Valsartan Other (See Comments)   Blood pressure rise   Lisinopril Cough   Calcium  Channel Blockers Palpitations   Blow pressure rise / tachycardia    Penicillins Rash      Medication List    TAKE these medications   aspirin 81 MG chewable tablet Chew 1 tablet (81 mg total) by mouth 2 (two) times daily. Take for 4 weeks.   b complex vitamins tablet Take 1 tablet by mouth daily.   calcium-vitamin D 500-200 MG-UNIT tablet Take 1 tablet by mouth daily.   carvedilol 3.125 MG tablet Commonly known as:  COREG Take 3.125 mg by mouth 2 (two) times  daily with a meal.   CoQ10 200 MG Caps Take 1 capsule by mouth daily.   docusate sodium 100 MG capsule Commonly known as:  COLACE Take 1 capsule (100 mg total) by mouth 2 (two) times daily.   ENTRESTO 24-26 MG Generic drug:  sacubitril-valsartan Take 1 tablet by mouth daily.   ferrous sulfate 325 (65 FE) MG tablet Take 1 tablet (325 mg total) by mouth 3 (three) times daily after meals.   furosemide 20 MG tablet Commonly known as:  LASIX Take 20 mg by mouth daily.   HYDROcodone-acetaminophen 7.5-325 MG tablet Commonly known as:  NORCO Take 1-2 tablets by mouth every 4 (four) hours as needed for moderate pain.   magnesium oxide 400 MG tablet Commonly known as:  MAG-OX Take 400 mg by mouth daily.   methocarbamol 500 MG tablet Commonly known as:  ROBAXIN Take 1 tablet (500 mg total) by mouth every 6 (six) hours as needed for muscle spasms.   multivitamin with minerals tablet Take 1 tablet by mouth daily.   polyethylene glycol packet Commonly known as:  MIRALAX / GLYCOLAX Take 17 g by mouth 2 (two) times daily.   zinc gluconate 50 MG tablet Take 50 mg by mouth daily.        Signed: [place] Pugh. Chenay Nesmith   PA-C  05/06/2017, 2:01 PM

## 2017-05-06 NOTE — Progress Notes (Signed)
Barbara Claude, PA on call for Dr. Alvan Dame paged regarding patient's continued elevated BP. Pt asymptomatic, however, continues to rate pain 5-6, and only taking1 pain pill, despite education. Barbara Kelley felt that high BP was pain r/t and did not want to add any additional BP or cardiac medications at this time. PA encouraged RN to discuss pain management with patient and encouraged additional pain pill. Also advised to continue to monitor for changes. Rounding MD to advise further in AM.

## 2017-05-06 NOTE — Progress Notes (Signed)
Physical Therapy Treatment Patient Details Name: Barbara Kelley MRN: [id number] DOB: 05/03/1934 Today's Date: 05/06/2017    History of Present Illness s/p L TKA and with hx of CVA, CHF, L THR and cardiomyopathy    PT Comments    Pt continues motivated and progressing well with mobility.  Pt eager for move to rehab setting and then home.   Follow Up Recommendations  SNF     Equipment Recommendations  Rolling walker with 5" wheels    Recommendations for Other Services OT consult     Precautions / Restrictions Precautions Precautions: Knee;Fall Restrictions Weight Bearing Restrictions: No Other Position/Activity Restrictions: WBAT    Mobility  Bed Mobility Overal bed mobility: Needs Assistance Bed Mobility: Supine to Sit     Supine to sit: Min guard Sit to supine: Min assist   General bed mobility comments: cues for sequence and use of R LE to self assist  Transfers Overall transfer level: Needs assistance Equipment used: Rolling walker (2 wheeled) Transfers: Sit to/from Stand Sit to Stand: Min guard         General transfer comment: min cues for LE management and use of UEs to self assist  Ambulation/Gait Ambulation/Gait assistance: Min assist;Min guard Ambulation Distance (Feet): 180 Feet Assistive device: Rolling walker (2 wheeled) Gait Pattern/deviations: Step-to pattern;Step-through pattern;Decreased step length - right;Decreased step length - left;Shuffle;Trunk flexed Gait velocity: decr Gait velocity interpretation: Below normal speed for age/gender General Gait Details: cues for sequence, posture and position from [employer]            Wheelchair Mobility    Modified Rankin (Stroke Patients Only)       Balance                                            Cognition Arousal/Alertness: Awake/alert Behavior During Therapy: WFL for tasks assessed/performed Overall Cognitive Status: Within Functional Limits for tasks  assessed                                        Exercises Total Joint Exercises Ankle Circles/Pumps: AROM;Both;15 reps;Supine Quad Sets: AROM;Both;Supine;15 reps Heel Slides: AAROM;Left;Supine;20 reps Straight Leg Raises: AAROM;AROM;Left;Supine;20 reps Long Arc Quad: AAROM;AROM;Left;10 reps;Seated Goniometric ROM: AAROM L knee -10 - 95    General Comments        Pertinent Vitals/Pain Pain Assessment: 0-10 Pain Score: 3  Faces Pain Scale: Hurts little more Pain Location: L knee Pain Descriptors / Indicators: Aching;Sore Pain Intervention(s): Limited activity within patient's tolerance;Monitored during session;Premedicated before session;Ice applied    Home Living Family/patient expects to be discharged to:: Group home Living Arrangements: Other (Comment)                  Prior Function        Comments: from [place] independent [facility]   PT Goals (current goals can now be found in the care plan section) Acute Rehab PT Goals Patient Stated Goal: rehab to help get her back to being independent PT Goal Formulation: With patient Time For Goal Achievement: 05/15/17 Potential to Achieve Goals: Good Progress towards PT goals: Progressing toward goals    Frequency    7X/week      PT Plan Current plan remains appropriate    Co-evaluation  AM-PAC PT "6 Clicks" Daily Activity  Outcome Measure  Difficulty turning over in bed (including adjusting bedclothes, sheets and blankets)?: Total Difficulty moving from lying on back to sitting on the side of the bed? : Total Difficulty sitting down on and standing up from a chair with arms (e.g., wheelchair, bedside commode, etc,.)?: Total Help needed moving to and from a bed to chair (including a wheelchair)?: A Little Help needed walking in hospital room?: A Little Help needed climbing 3-5 steps with a railing? : A Little 6 Click Score: 12    End of Session Equipment Utilized  During Treatment: Gait belt Activity Tolerance: Patient tolerated treatment well Patient left: in chair;with call bell/phone within reach Nurse Communication: Mobility status PT Visit Diagnosis: Unsteadiness on feet (R26.81);Difficulty in walking, not elsewhere classified (R26.2)     Time: 7703-4035 PT Time Calculation (min) (ACUTE ONLY): 32 min  Charges:  $Gait Training: 8-22 mins $Therapeutic Exercise: 8-22 mins                    G Codes:       Pg [phone]    Jaxsyn Azam 05/06/2017, 11:32 AM

## 2017-05-06 NOTE — Clinical Social Work Placement (Signed)
   CLINICAL SOCIAL WORK PLACEMENT  NOTE  Date:  05/06/2017  Patient Details  Name: DEMARIE UHLIG MRN: [id number] Date of Birth: [birth year]  Clinical Social Work is seeking post-discharge placement for this patient at the [place] level of care (*CSW will initial, date and re-position this form in  chart as items are completed):  Yes   Patient/family provided with [facility] Work Department's list of facilities offering this level of care within the geographic area requested by the patient (or if unable, by the patient's family).  Yes   Patient/family informed of their freedom to choose among providers that offer the needed level of care, that participate in Medicare, Medicaid or managed care program needed by the patient, have an available bed and are willing to accept the patient.  Yes   Patient/family informed of [facility]'s ownership interest in [facility] and [facility], as well as of the fact that they are under no obligation to receive care at these facilities.  PASRR submitted to EDS on 05/05/17     PASRR number received on 05/05/17     Existing PASRR number confirmed on       FL2 transmitted to all facilities in geographic area requested by pt/family on 05/05/17     FL2 transmitted to all facilities within larger geographic area on       Patient informed that his/her managed care company has contracts with or will negotiate with certain facilities, including the following:        Yes   Patient/family informed of bed offers received.  Patient chooses bed at [facility]     Physician recommends and patient chooses bed at      Patient to be [facility] on 05/06/17.  Patient to be transferred to facility by car     Patient family notified on 05/06/17 of transfer.  Name of family member notified:  daughter     PHYSICIAN       Additional Comment: Pt / daughter are in agreement  with dc to [facility] today. PT approved transport by car. DC Summary sent to SNF for review. Scripts included in [employer]. DC packet provided to pt.   _______________________________________________ [name], [place] 05/06/2017, 2:58 PM

## 2017-05-06 NOTE — Progress Notes (Signed)
Patient ID: Barbara Kelley, female   DOB: 11/02/1933, 83 y.o.   MRN: [id number] Subjective: 2 Days Post-Op Procedure(s) (LRB): LEFT TOTAL KNEE ARTHROPLASTY (Left)    Patient reports pain as moderate.  "Finally got some sleep last night".  Doing better.  Plan for ST SNF discharge  Objective:   VITALS:   Vitals:   05/06/17 0205 05/06/17 0509  BP: (!) 186/96 (!) 168/87  Pulse: 92 90  Resp:  16  Temp:  98.2 F (36.8 C)    Neurovascular intact Incision: dressing C/D/I  LABS  Recent Labs  05/05/17 0719 05/06/17 0541  HGB 11.3* 10.7*  HCT 33.8* 32.2*  WBC 15.0* 14.3*  PLT 168 160     Recent Labs  05/05/17 0719 05/06/17 0541  NA 141 140  K 3.7 4.1  BUN 9 13  CREATININE 0.49 0.61  GLUCOSE 139* 115*    No results for input(s): LABPT, INR in the last 72 hours.   Assessment/Plan: 2 Days Post-Op Procedure(s) (LRB): LEFT TOTAL KNEE ARTHROPLASTY (Left)   Up with therapy Discharge to SNF probably today Reviewed goals RTC in 2 weeks

## 2017-05-06 NOTE — Progress Notes (Signed)
Report was given to patient's nurse at [facility].

## 2017-10-18 ENCOUNTER — Emergency Department (HOSPITAL_COMMUNITY): Payer: Medicare Other

## 2017-10-18 ENCOUNTER — Inpatient Hospital Stay (HOSPITAL_COMMUNITY)
Admission: EM | Admit: 2017-10-18 | Discharge: 2017-10-21 | DRG: 470 | Disposition: A | Discharge status: Home Health | Payer: Medicare Other | Attending: Internal Medicine | Admitting: Internal Medicine

## 2017-10-18 ENCOUNTER — Other Ambulatory Visit: Payer: Self-pay

## 2017-10-18 ENCOUNTER — Encounter (HOSPITAL_COMMUNITY): Payer: Self-pay | Admitting: Emergency Medicine

## 2017-10-18 DIAGNOSIS — S72001A Fracture of unspecified part of neck of right femur, initial encounter for closed fracture: Secondary | ICD-10-CM

## 2017-10-18 DIAGNOSIS — I5022 Chronic systolic (congestive) heart failure: Secondary | ICD-10-CM | POA: Diagnosis present

## 2017-10-18 DIAGNOSIS — Z880 Allergy status to penicillin: Secondary | ICD-10-CM

## 2017-10-18 DIAGNOSIS — G629 Polyneuropathy, unspecified: Secondary | ICD-10-CM | POA: Diagnosis present

## 2017-10-18 DIAGNOSIS — I10 Essential (primary) hypertension: Secondary | ICD-10-CM | POA: Diagnosis not present

## 2017-10-18 DIAGNOSIS — M810 Age-related osteoporosis without current pathological fracture: Secondary | ICD-10-CM | POA: Diagnosis present

## 2017-10-18 DIAGNOSIS — Z96652 Presence of left artificial knee joint: Secondary | ICD-10-CM | POA: Diagnosis present

## 2017-10-18 DIAGNOSIS — K219 Gastro-esophageal reflux disease without esophagitis: Secondary | ICD-10-CM | POA: Diagnosis present

## 2017-10-18 DIAGNOSIS — E785 Hyperlipidemia, unspecified: Secondary | ICD-10-CM | POA: Diagnosis present

## 2017-10-18 DIAGNOSIS — R55 Syncope and collapse: Secondary | ICD-10-CM | POA: Diagnosis not present

## 2017-10-18 DIAGNOSIS — I119 Hypertensive heart disease without heart failure: Secondary | ICD-10-CM | POA: Diagnosis present

## 2017-10-18 DIAGNOSIS — K5900 Constipation, unspecified: Secondary | ICD-10-CM | POA: Diagnosis present

## 2017-10-18 DIAGNOSIS — I428 Other cardiomyopathies: Secondary | ICD-10-CM | POA: Diagnosis not present

## 2017-10-18 DIAGNOSIS — W010XXA Fall on same level from slipping, tripping and stumbling without subsequent striking against object, initial encounter: Secondary | ICD-10-CM | POA: Diagnosis present

## 2017-10-18 DIAGNOSIS — I110 Hypertensive heart disease with heart failure: Secondary | ICD-10-CM | POA: Diagnosis present

## 2017-10-18 DIAGNOSIS — D72828 Other elevated white blood cell count: Secondary | ICD-10-CM | POA: Diagnosis present

## 2017-10-18 DIAGNOSIS — S72011A Unspecified intracapsular fracture of right femur, initial encounter for closed fracture: Secondary | ICD-10-CM | POA: Diagnosis present

## 2017-10-18 DIAGNOSIS — Z90710 Acquired absence of both cervix and uterus: Secondary | ICD-10-CM | POA: Diagnosis not present

## 2017-10-18 DIAGNOSIS — Z9889 Other specified postprocedural states: Secondary | ICD-10-CM

## 2017-10-18 DIAGNOSIS — Z8781 Personal history of (healed) traumatic fracture: Secondary | ICD-10-CM

## 2017-10-18 DIAGNOSIS — Z8673 Personal history of transient ischemic attack (TIA), and cerebral infarction without residual deficits: Secondary | ICD-10-CM

## 2017-10-18 DIAGNOSIS — M62838 Other muscle spasm: Secondary | ICD-10-CM | POA: Diagnosis present

## 2017-10-18 DIAGNOSIS — I429 Cardiomyopathy, unspecified: Secondary | ICD-10-CM | POA: Diagnosis present

## 2017-10-18 DIAGNOSIS — Y92013 Bedroom of single-family (private) house as the place of occurrence of the external cause: Secondary | ICD-10-CM | POA: Diagnosis not present

## 2017-10-18 DIAGNOSIS — I340 Nonrheumatic mitral (valve) insufficiency: Secondary | ICD-10-CM | POA: Diagnosis not present

## 2017-10-18 DIAGNOSIS — M25551 Pain in right hip: Secondary | ICD-10-CM | POA: Diagnosis not present

## 2017-10-18 LAB — CBC WITH DIFFERENTIAL/PLATELET
Basophils Absolute: 0 10*3/uL (ref 0.0–0.1)
Basophils Relative: 0 %
EOS PCT: 0 %
Eosinophils Absolute: 0 10*3/uL (ref 0.0–0.7)
HCT: 44.4 % (ref 36.0–46.0)
Hemoglobin: 14.3 g/dL (ref 12.0–15.0)
LYMPHS ABS: 1.3 10*3/uL (ref 0.7–4.0)
Lymphocytes Relative: 7 %
MCH: 28.3 pg (ref 26.0–34.0)
MCHC: 32.2 g/dL (ref 30.0–36.0)
MCV: 87.7 fL (ref 78.0–100.0)
MONO ABS: 1.1 10*3/uL — AB (ref 0.1–1.0)
MONOS PCT: 6 %
NEUTROS ABS: 16.2 10*3/uL — AB (ref 1.7–7.7)
Neutrophils Relative %: 87 %
PLATELETS: 184 10*3/uL (ref 150–400)
RBC: 5.06 MIL/uL (ref 3.87–5.11)
RDW: 13.1 % (ref 11.5–15.5)
WBC: 18.6 10*3/uL — ABNORMAL HIGH (ref 4.0–10.5)

## 2017-10-18 LAB — BASIC METABOLIC PANEL
Anion gap: 12 (ref 5–15)
BUN: 11 mg/dL (ref 6–20)
CO2: 25 mmol/L (ref 22–32)
Calcium: 9.4 mg/dL (ref 8.9–10.3)
Chloride: 103 mmol/L (ref 101–111)
Creatinine, Ser: 0.54 mg/dL (ref 0.44–1.00)
GFR calc Af Amer: >60 mL/min (ref >60)
GFR calc non Af Amer: >60 mL/min (ref >60)
GLUCOSE: 155 mg/dL — AB (ref 65–99)
Potassium: 3.3 mmol/L — ABNORMAL LOW (ref 3.5–5.1)
Sodium: 140 mmol/L (ref 135–145)

## 2017-10-18 LAB — URINALYSIS, ROUTINE W REFLEX MICROSCOPIC
Bilirubin Urine: NEGATIVE
GLUCOSE, UA: NEGATIVE mg/dL
Hgb urine dipstick: NEGATIVE
Ketones, ur: 5 mg/dL — AB
Nitrite: POSITIVE — AB
PROTEIN: NEGATIVE mg/dL
Specific Gravity, Urine: 1.012 (ref 1.005–1.030)
pH: 7 (ref 5.0–8.0)

## 2017-10-18 LAB — TYPE AND SCREEN
ABO/RH(D): A POS
Antibody Screen: NEGATIVE

## 2017-10-18 LAB — ABO/RH: ABO/RH(D): A POS

## 2017-10-18 LAB — TROPONIN I: Troponin I: 0.04 ng/mL (ref <0.03)

## 2017-10-18 LAB — PROTIME-INR
INR: 0.99
Prothrombin Time: 12.9 seconds (ref 11.4–15.2)

## 2017-10-18 LAB — ALBUMIN: Albumin: 3.9 g/dL (ref 3.5–5.0)

## 2017-10-18 MED ORDER — POTASSIUM CHLORIDE 10 MEQ/100ML IV SOLN
10.0000 meq | Freq: Once | INTRAVENOUS | Status: AC
  Administered 2017-10-18: 10 meq via INTRAVENOUS
  Filled 2017-10-18: qty 100

## 2017-10-18 MED ORDER — ADULT MULTIVITAMIN W/MINERALS CH
ORAL_TABLET | Freq: Every day | ORAL | Status: DC
  Administered 2017-10-18 – 2017-10-21 (×3): 1 via ORAL
  Filled 2017-10-18 (×3): qty 1

## 2017-10-18 MED ORDER — CALCIUM CARBONATE-VITAMIN D 500-200 MG-UNIT PO TABS
1.0000 | ORAL_TABLET | Freq: Every day | ORAL | Status: DC
  Administered 2017-10-18 – 2017-10-21 (×3): 1 via ORAL
  Filled 2017-10-18 (×4): qty 1

## 2017-10-18 MED ORDER — FENTANYL CITRATE (PF) 100 MCG/2ML IJ SOLN
50.0000 ug | INTRAMUSCULAR | Status: AC | PRN
  Administered 2017-10-18 (×2): 50 ug via INTRAVENOUS
  Filled 2017-10-18 (×2): qty 2

## 2017-10-18 MED ORDER — CARVEDILOL 3.125 MG PO TABS
3.1250 mg | ORAL_TABLET | Freq: Two times a day (BID) | ORAL | Status: DC
  Administered 2017-10-18 – 2017-10-21 (×5): 3.125 mg via ORAL
  Filled 2017-10-18 (×6): qty 1

## 2017-10-18 MED ORDER — METHOCARBAMOL 1000 MG/10ML IJ SOLN
500.0000 mg | Freq: Four times a day (QID) | INTRAVENOUS | Status: DC | PRN
  Administered 2017-10-18 – 2017-10-19 (×3): 500 mg via INTRAVENOUS
  Filled 2017-10-18 (×4): qty 5

## 2017-10-18 MED ORDER — CARVEDILOL 3.125 MG PO TABS: 3.1250 mg | ORAL_TABLET | Freq: Two times a day (BID) | ORAL | Status: DC

## 2017-10-18 MED ORDER — SODIUM CHLORIDE 0.9 % IV SOLN
INTRAVENOUS | Status: DC
  Administered 2017-10-18 – 2017-10-19 (×2): via INTRAVENOUS

## 2017-10-18 MED ORDER — FENTANYL CITRATE (PF) 100 MCG/2ML IJ SOLN
12.5000 ug | INTRAMUSCULAR | Status: DC | PRN
  Administered 2017-10-18 – 2017-10-19 (×3): 12.5 ug via INTRAVENOUS
  Filled 2017-10-18 (×4): qty 2

## 2017-10-18 MED ORDER — OMEGA-3-ACID ETHYL ESTERS 1 G PO CAPS
1.0000 g | ORAL_CAPSULE | Freq: Two times a day (BID) | ORAL | Status: DC
  Administered 2017-10-18 – 2017-10-21 (×6): 1 g via ORAL
  Filled 2017-10-18 (×7): qty 1

## 2017-10-18 MED ORDER — METOCLOPRAMIDE HCL 5 MG/ML IJ SOLN
5.0000 mg | Freq: Once | INTRAMUSCULAR | Status: AC
  Administered 2017-10-18: 5 mg via INTRAVENOUS
  Filled 2017-10-18: qty 2

## 2017-10-18 NOTE — ED Triage Notes (Signed)
Patient presents to the ED from [facility] reports last night she got up to go to the restroom became dizzy and feel. Patient reports fell on right hip. Patient reports 7/10 pain. Patient given 22mcg of fentanyl by EMS. Pulse pulse present, sensation intact. Cap refill less than 5 sec.

## 2017-10-18 NOTE — Consult Note (Signed)
[name] is an 84 y.o. female.  HPI: Barbara Kelley got up from bed to go to the bathroom, got dizzy, and fell. She had immediate right hip pain and was unable to get up or ambulate. She describes the dizziness at vertiginous and says it has never happened before. She lives in independent living at a continuous care facility. [name]  Past Medical History:  Diagnosis Date  . Arthritis   . Cardiomyopathy ([place])   . Congestive heart failure (CHF) ([place])   . GERD (gastroesophageal reflux disease)    currently non problematic   . Hypertension   . 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 Pneumonia    x2 most recent fall of 2017   . PONV (postoperative nausea and vomiting)   . Stroke [facility]) 2008   deficit of neuroplathy in left hand and left foot     Past Surgical History:  Procedure Laterality Date  . ABDOMINAL HYSTERECTOMY    . APPENDECTOMY    . BLADDER SUSPENSION    . CARDIAC CATHETERIZATION  10/2015  . CHOLECYSTECTOMY    . JOINT REPLACEMENT Left 2013   left hip  . TONSILLECTOMY    . TOTAL KNEE ARTHROPLASTY Left 05/04/2017   Procedure: LEFT TOTAL KNEE ARTHROPLASTY;  Surgeon: Paralee Cancel, MD;  Location: [facility];  Service: Orthopedics;  Laterality: Left;  70 mins  . TUBAL LIGATION      No family history on file.  Social History:  reports that  has never smoked. she has never used smokeless tobacco. She reports that she does not drink alcohol or use drugs.  Allergies:  Allergies  Allergen Reactions  . Doxycycline Nausea Only    Vertigo  . Macrobid [Nitrofurantoin] Other (See Comments)    Nerve pain  . Valsartan Other (See Comments)    Blood pressure rise  . Lisinopril Cough  . Calcium Channel Blockers Palpitations    Blow pressure rise / tachycardia   . Penicillins Rash    Medications: I have reviewed the patient's current medications.  Results for  orders placed or performed during the hospital encounter of 10/18/17 (from the past 48 hour(s))  Basic metabolic panel     Status: Abnormal   Collection Time: 10/18/17  7:13 AM  Result Value Ref Range   Sodium 140 135 - 145 mmol/L   Potassium 3.3 (L) 3.5 - 5.1 mmol/L   Chloride 103 101 - 111 mmol/L   CO2 25 22 - 32 mmol/L   Glucose, [name], Ser 0.54 0.44 - 1.00 mg/dL   Calcium 9.4 8.9 - 10.3 mg/dL   GFR calc non Af Amer >60 >60 mL/min   GFR calc Af Amer >60 >60 mL/min    Comment: (NOTE) The eGFR has been calculated using the CKD EPI equation. This calculation has not been validated in all clinical situations. eGFR's persistently <60 mL/min signify possible Chronic Kidney Disease.    Anion gap 12 5 - 15  CBC WITH DIFFERENTIAL     Status: Abnormal   Collection Time: 10/18/17  7:13 AM  Result Value Ref Range   WBC 18.6 (H) 4.0 - 10.5 K/uL   RBC 5.06 3.87 - 5.11 MIL/uL   Hemoglobin 14.3 12.0 - 15.0 g/dL   HCT 44.4 36.0 - 46.0 %  MCV 87.7 78.0 - 100.0 fL   MCH 28.3 26.0 - 34.0 pg   MCHC 32.2 30.0 - 36.0 g/dL   RDW 13.1 11.5 - 15.5 %   Platelets 184 150 - 400 K/uL   Neutrophils Relative % 87 %   Neutro Abs 16.2 (H) 1.7 - 7.7 K/uL   Lymphocytes Relative 7 %   Lymphs Abs 1.3 0.7 - 4.0 K/uL   Monocytes Relative 6 %   Monocytes Absolute 1.1 (H) 0.1 - 1.0 K/uL   Eosinophils Relative 0 %   Eosinophils Absolute 0.0 0.0 - 0.7 K/uL   Basophils Relative 0 %   Basophils Absolute 0.0 0.0 - 0.1 K/uL  Protime-INR     Status: None   Collection Time: 10/18/17  7:13 AM  Result Value Ref Range   Prothrombin Time 12.9 11.4 - 15.2 seconds   INR 0.99   Urinalysis, Routine w reflex microscopic     Status: Abnormal   Collection Time: 10/18/17  7:16 AM  Result Value Ref Range   Color, [name], [name], [name], [name], [name], [name], [name], UA RARE (A) NONE SEEN   Squamous Epithelial / LPF 0-5 (A) NONE SEEN   Mucus PRESENT   Type and screen [facility] [place]     Status: None   Collection Time: 10/18/17  9:14 AM  Result Value Ref Range   ABO/RH(D) A POS    Antibody Screen NEG    Sample Expiration 10/21/2017   ABO/Rh     Status: None (Preliminary result)   Collection Time: 10/18/17  9:14 AM  Result Value Ref Range   ABO/RH(D) A POS     Ct Head Wo Contrast  Result Date: 10/18/2017 CLINICAL DATA:  84 year old female who awoke with dizziness and fell while walking to the bathroom. Posttraumatic headache. EXAM: CT HEAD WITHOUT CONTRAST CT CERVICAL SPINE WITHOUT CONTRAST TECHNIQUE: Multidetector CT imaging of the head and cervical spine was performed following the standard protocol without intravenous contrast. Multiplanar CT image reconstructions of the cervical spine were also generated. COMPARISON:  Chest CTA 09/18/2016 FINDINGS: CT HEAD FINDINGS Brain: Patchy and confluent bilateral cerebral white matter hypodensity associated with hypodensity in the left caudate nucleus and mild ex vacuo enlargement of the adjacent left frontal horn. Mild deep gray matter hypodensity and heterogeneity elsewhere. Small chronic appearing lacunar infarct of the inferior right cerebellum. No cortical encephalomalacia identified. No midline shift, ventriculomegaly, mass effect, evidence of mass lesion, [name], normal variant.  No skull fracture identified. Sinuses/Orbits:  Mild mucosal thickening in the ethmoids. Scattered mucosal thickening and/or small mucous retention cysts in both maxillary sinuses. Otherwise clear. Other: Visualized orbit soft tissues are within normal limits. Suspected right right posterior convexity scalp soft tissue contusion (series 5, image 43). No other acute scalp soft tissue findings identified. CT CERVICAL SPINE FINDINGS Alignment: Mild straightening of cervical lordosis. Bilateral  posterior element alignment is within normal limits. Cervicothoracic junction alignment is within normal limits. Skull base and vertebrae: Visualized skull base is intact. No atlanto-occipital dissociation. No cervical spine fracture identified. Soft tissues and spinal canal: No prevertebral fluid or swelling. No visible canal hematoma. Negative noncontrast neck soft tissues aside from calcified carotid artery atherosclerosis. Disc levels: Bulky osteophytosis along the anterior C1-odontoid articulation. Multilevel cervical facet hypertrophy greater on the right. Chronic disc space loss and endplate degeneration at C5-C6 and C6-C7. Up to mild lower cervical spinal stenosis. Upper chest: Visible upper thoracic levels appear intact. Pulmonary septal thickening in the lung apices. IMPRESSION: 1. Posterior right scalp contusion suspected. No underlying skull fracture. 2. Age indeterminate but mostly chronic appearing small vessel ischemia in the cerebral white matter, deep gray matter nuclei, and right cerebellum. 3. No acute fracture in the cervical spine. Cervical facet, disc, and endplate degeneration with up to mild degenerative lower cervical spinal stenosis. Electronically Signed   By: Genevie Ann M.D.   On: 10/18/2017 09:52   Ct Cervical Spine Wo Contrast  Result Date: 10/18/2017 CLINICAL DATA:  84 year old female who awoke with dizziness and fell while walking to the bathroom. Posttraumatic headache. EXAM: CT HEAD WITHOUT CONTRAST CT CERVICAL SPINE WITHOUT CONTRAST  TECHNIQUE: Multidetector CT imaging of the head and cervical spine was performed following the standard protocol without intravenous contrast. Multiplanar CT image reconstructions of the cervical spine were also generated. COMPARISON:  Chest CTA 09/18/2016 FINDINGS: CT HEAD FINDINGS Brain: Patchy and confluent bilateral cerebral white matter hypodensity associated with hypodensity in the left caudate nucleus and mild ex vacuo enlargement of the adjacent left frontal horn. Mild deep gray matter hypodensity and heterogeneity elsewhere. Small chronic appearing lacunar infarct of the inferior right cerebellum. No cortical encephalomalacia identified. No midline shift, ventriculomegaly, mass effect, evidence of mass lesion, [name], normal variant.  No skull fracture identified. Sinuses/Orbits: Mild mucosal thickening in the ethmoids. Scattered mucosal thickening and/or small mucous retention cysts in both maxillary sinuses. Otherwise clear. Other: Visualized orbit soft tissues are within normal limits. Suspected right right posterior convexity scalp soft tissue contusion (series 5, image 43). No other acute scalp soft tissue findings identified. CT CERVICAL SPINE FINDINGS Alignment: Mild straightening of cervical lordosis. Bilateral posterior element alignment is within normal limits. Cervicothoracic junction alignment is within normal limits. Skull base and vertebrae: Visualized skull base is intact. No atlanto-occipital dissociation. No cervical spine fracture identified. Soft tissues and spinal canal: No prevertebral fluid or swelling. No visible canal hematoma. Negative noncontrast neck soft tissues aside from calcified carotid artery atherosclerosis. Disc levels: Bulky osteophytosis along the anterior C1-odontoid articulation.  Multilevel cervical facet hypertrophy greater on the right. Chronic disc space loss and endplate degeneration at C5-C6 and C6-C7. Up to mild lower cervical spinal stenosis. Upper chest: Visible upper thoracic levels appear intact. Pulmonary septal thickening in the lung apices. IMPRESSION: 1. Posterior right scalp contusion suspected. No underlying skull fracture. 2. Age indeterminate but mostly chronic appearing small vessel ischemia in the cerebral white matter, deep gray matter nuclei, and right cerebellum. 3. No acute fracture in the cervical spine. Cervical facet, disc, and endplate degeneration with up to mild degenerative lower cervical spinal stenosis. Electronically Signed   By: Genevie Ann M.D.   On: 10/18/2017 09:52   Dg Hip Unilat With Pelvis 2-3 Views Right  Result Date: 10/18/2017 CLINICAL DATA:  Pain following fall EXAM: DG HIP (WITH OR WITHOUT PELVIS) 2-3V RIGHT COMPARISON:  None. FINDINGS: Frontal pelvis as well as frontal and lateral right hip images were obtained. There is a subcapital femoral neck fracture on the right with mild impaction at the fracture site. No other fracture evident. No dislocation. There is a total hip replacement on the left with visualized prosthetic components on the left appearing well-seated. There is moderate narrowing of the right hip joint. Bones are diffusely osteoporotic. IMPRESSION: Subcapital femoral neck fracture on the right with impaction at the fracture site. No other acute fracture. No dislocation. Narrowing right hip joint. Bones osteoporotic. Total hip replacement on the left with prosthetic components appearing well-seated in this area. Electronically Signed   By: Lowella Grip III M.D.   On: 10/18/2017 08:21    Review of Systems  Constitutional: Negative for weight loss.  HENT: Negative for ear discharge, ear pain, hearing loss and tinnitus.   Eyes: Negative for blurred vision, double vision, photophobia and pain.  Respiratory: Negative for  cough, sputum production and shortness of breath.   Cardiovascular: Negative for chest pain.  Gastrointestinal: Negative for abdominal pain, nausea and vomiting.  Genitourinary: Negative for dysuria, flank pain, frequency and urgency.  Musculoskeletal: Positive for joint pain (Right hip). Negative for back pain, falls, myalgias and neck pain.  Neurological: Positive for dizziness. Negative for tingling, sensory change, focal weakness, loss of consciousness and headaches.  Endo/Heme/Allergies: Does not bruise/bleed easily.  Psychiatric/Behavioral: Negative for depression, memory loss and substance abuse. The patient is not nervous/anxious.    Blood pressure (!) 154/79, pulse 86, temperature 98 F (36.7 C), temperature source Oral, resp. rate (!) 21, height '4\' 11"'  (1.499 m), weight 56.7 kg (125 lb), SpO2 92 %. Physical Exam  Constitutional: She appears well-developed and well-nourished. No distress.  HENT:  Head: Normocephalic.  Eyes: Conjunctivae are normal. Right eye exhibits no discharge. Left eye exhibits no discharge. No scleral icterus.  Neck: Normal range of motion.  Cardiovascular: Normal rate and regular rhythm.  Respiratory: Effort normal. No respiratory distress.  Musculoskeletal:  RLE No traumatic wounds, ecchymosis, or rash  Mild TTP hip  No knee or ankle effusion  Knee stable to varus/ valgus and anterior/posterior stress  Sens DPN, SPN, TN intact  Motor EHL, ext, flex, evers 5/5  DP 2+, PT 2+, No significant edema  LLE No traumatic wounds, ecchymosis, or rash  Nontender  No knee or ankle effusion  Knee stable to varus/ valgus and anterior/posterior stress  Sens DPN, SPN, TN intact  Motor EHL, ext, flex, evers 5/5  DP 2+, PT 2+, No significant edema  Neurological: She is alert.  Skin: Skin is warm and dry. She is not diaphoretic.  Psychiatric: She has a normal mood and affect. Her behavior is normal.    Assessment/Plan: Fall Right subcapital hip fx -- Likely  needs a hemi, Dr. Alvan Dame to perform, timing TBD. Dizziness -- IM to assess, given new symptoms will need clearance before surgery HTN/CHF    Lisette Abu, PA-C Orthopedic Surgery [phone] 10/18/2017, 10:30 AM

## 2017-10-18 NOTE — ED Notes (Signed)
Surgery at bedside.

## 2017-10-18 NOTE — Progress Notes (Signed)
Orthopedic Tech Progress Note Patient Details:  Barbara Kelley 1934-09-14 [id number]  [facility] Applied.  Pt agreed she would be able to use frame.   -pt was able to use [facility].  Patient ID: Barbara Kelley, female   DOB: 1934/10/05, 84 y.o.   MRN: [id number]   Kristopher Oppenheim 10/18/2017, 6:56 PM

## 2017-10-18 NOTE — ED Notes (Signed)
Patient transported to CT 

## 2017-10-18 NOTE — ED Provider Notes (Signed)
[facility] EMERGENCY DEPARTMENT Provider Note   CSN: [id number] Arrival date & time: 10/18/17  7096     History   Chief Complaint Chief Complaint  Patient presents with  . Fall  . Hip Pain    HPI Barbara Kelley is a 84 y.o. female.  HPI Patient reports that she became dizzy feeling room spinning when she woke up this morning she went to walk to the bathroom and fell injuring her right hip.  She complains of right hip pain and mild occipital headache since the fall.  She no longer feels dizzy.  No visual changes.  No other associated symptoms.  She was treated by EMS with a pelvic binder and received fentanyl 50 mcg IV with relief.  Hip pain is worse with movement improved with remaining still.  No other associated symptoms.  Headache is mild and constant. Past Medical History:  Diagnosis Date  . Arthritis   . Cardiomyopathy ([place])   . Congestive heart failure (CHF) ([place])   . GERD (gastroesophageal reflux disease)    currently non problematic   . Hypertension   . 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 Pneumonia    x2 most recent fall of 2017   . PONV (postoperative nausea and vomiting)   . 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 S/P left TKA 05/04/2017  . S/P total knee replacement 05/04/2017    Past Surgical History:  Procedure Laterality Date  . ABDOMINAL HYSTERECTOMY    . APPENDECTOMY    . BLADDER SUSPENSION    . CARDIAC CATHETERIZATION  10/2015  . CHOLECYSTECTOMY    . JOINT REPLACEMENT Left 2013   left hip  . TONSILLECTOMY    . TOTAL KNEE ARTHROPLASTY Left 05/04/2017   Procedure: LEFT TOTAL KNEE ARTHROPLASTY;  Surgeon: Paralee Cancel, MD;  Location: [facility];  Service: Orthopedics;  Laterality: Left;  70 mins  . TUBAL LIGATION      OB History    No data available       Home Medications    Prior to Admission medications     Medication Sig Start Date End Date Taking? Authorizing Provider  b complex vitamins tablet Take 1 tablet by mouth daily.    [provider]  Calcium Carb-Cholecalciferol (CALCIUM-VITAMIN D) 500-200 MG-UNIT tablet Take 1 tablet by mouth daily.    [provider]  carvedilol (COREG) 3.125 MG tablet Take 3.125 mg by mouth 2 (two) times daily with a meal.    [provider]  Coenzyme Q10 (COQ10) 200 MG CAPS Take 1 capsule by mouth daily.    [provider]  docusate sodium (COLACE) 100 MG capsule Take 1 capsule (100 mg total) by mouth 2 (two) times daily. 05/06/17   Danae Orleans, PA-C  ferrous sulfate 325 (65 FE) MG tablet Take 1 tablet (325 mg total) by mouth 3 (three) times daily after meals. 05/06/17   Danae Orleans, PA-C  furosemide (LASIX) 20 MG tablet Take 20 mg by mouth daily.    [provider]  HYDROcodone-acetaminophen (NORCO) 7.5-325 MG tablet Take 1-2 tablets by mouth every 4 (four) hours as needed for moderate pain. 05/06/17   Danae Orleans, PA-C  magnesium oxide (MAG-OX) 400 MG tablet Take 400 mg by mouth daily.    [provider]  methocarbamol (ROBAXIN) 500  MG tablet Take 1 tablet (500 mg total) by mouth every 6 (six) hours as needed for muscle spasms. 05/06/17   Danae Orleans, PA-C  Multiple Vitamins-Minerals (MULTIVITAMIN WITH MINERALS) tablet Take 1 tablet by mouth daily.    [provider]  polyethylene glycol (MIRALAX / GLYCOLAX) packet Take 17 g by mouth 2 (two) times daily. 05/06/17   Danae Orleans, PA-C  sacubitril-valsartan (ENTRESTO) 24-26 MG Take 1 tablet by mouth daily.    [provider]  zinc gluconate 50 MG tablet Take 50 mg by mouth daily.    [provider]    Family History No family history on file.  Social History Social History   Tobacco Use  . Smoking status: Never Smoker  . Smokeless tobacco: Never Used  Substance Use Topics  . Alcohol use: No  . Drug use: No      Allergies   Doxycycline; Macrobid [nitrofurantoin]; Valsartan; Lisinopril; Calcium channel blockers; and Penicillins   Review of Systems Review of Systems  Constitutional: Negative.   HENT: Negative.   Respiratory: Negative.   Cardiovascular: Negative.   Gastrointestinal: Negative.   Musculoskeletal: Positive for arthralgias.       Right hip pain  Skin: Negative.   Neurological: Positive for dizziness and headaches.  Psychiatric/Behavioral: Negative.   All other systems reviewed and are negative.    Physical Exam Updated Vital Signs BP (!) 168/103 (BP Location: Right Arm)   Pulse 94   Temp 98 F (36.7 C) (Oral)   Resp (!) 24   Ht 4\' 11"  (1.499 m)   Wt 56.7 kg (125 lb)   SpO2 100%   BMI 25.25 kg/m   Physical Exam  Constitutional: She appears well-developed and well-nourished. No distress.  Alert Glasgow Coma Score 15  HENT:  Head: Normocephalic and atraumatic.  Eyes: Conjunctivae are normal. Pupils are equal, round, and reactive to light.  Neck: Neck supple. No tracheal deviation present. No thyromegaly present.  Cardiovascular: Normal rate and regular rhythm.  No murmur heard. Pulmonary/Chest: Effort normal and breath sounds normal.  Abdominal: Soft. Bowel sounds are normal. She exhibits no distension. There is no tenderness.  Musculoskeletal: Normal range of motion. She exhibits no edema or tenderness.  Right lower extremity shortened externally rotated.  Tender at hip.  DP pulse 2+.  Good capillary refill.  All other extremities without contusion abrasion or tenderness neurovascular intact  Neurological: She is alert. No cranial nerve deficit. Coordination normal.  Pain or nerves II through XII grossly intact moves all extremities well.  Skin: Skin is warm and dry. No rash noted.  Psychiatric: She has a normal mood and affect.  Nursing note and vitals reviewed.    ED Treatments / Results  Labs (all labs ordered are listed, but only abnormal results  are displayed) Labs Reviewed  BASIC METABOLIC PANEL  CBC WITH DIFFERENTIAL/PLATELET  PROTIME-INR  URINALYSIS, ROUTINE W REFLEX MICROSCOPIC  TYPE AND SCREEN   Results for orders placed or performed during the hospital encounter of 26/94/85  Basic metabolic panel  Result Value Ref Range   Sodium 140 135 - 145 mmol/L   Potassium 3.3 (L) 3.5 - 5.1 mmol/L   Chloride 103 101 - 111 mmol/L   CO2 25 22 - 32 mmol/L   Glucose, [name], Ser 0.54 0.44 - 1.00 mg/dL   Calcium 9.4 8.9 - 10.3 mg/dL   GFR calc non Af Amer >60 >  60 mL/min   GFR calc Af Amer >60 >60 mL/min   Anion gap 12 5 - 15  CBC WITH DIFFERENTIAL  Result Value Ref Range   WBC 18.6 (H) 4.0 - 10.5 K/uL   RBC 5.06 3.87 - 5.11 MIL/uL   Hemoglobin 14.3 12.0 - 15.0 g/dL   HCT 44.4 36.0 - 46.0 %   MCV 87.7 78.0 - 100.0 fL   MCH 28.3 26.0 - 34.0 pg   MCHC 32.2 30.0 - 36.0 g/dL   RDW 13.1 11.5 - 15.5 %   Platelets 184 150 - 400 K/uL   Neutrophils Relative % 87 %   Neutro Abs 16.2 (H) 1.7 - 7.7 K/uL   Lymphocytes Relative 7 %   Lymphs Abs 1.3 0.7 - 4.0 K/uL   Monocytes Relative 6 %   Monocytes Absolute 1.1 (H) 0.1 - 1.0 K/uL   Eosinophils Relative 0 %   Eosinophils Absolute 0.0 0.0 - 0.7 K/uL   Basophils Relative 0 %   Basophils Absolute 0.0 0.0 - 0.1 K/uL  Protime-INR  Result Value Ref Range   Prothrombin Time 12.9 11.4 - 15.2 seconds   INR 0.99   Urinalysis, Routine w reflex microscopic  Result Value Ref Range   Color, [name], [name], UA NEGATIVE NEGATIVE mg/dL   Hgb urine dipstick NEGATIVE NEGATIVE   Bilirubin Urine NEGATIVE NEGATIVE   Ketones, [name], [name], [name], [name], UA RARE (A)  NONE SEEN   Squamous Epithelial / LPF 0-5 (A) NONE SEEN   Mucus PRESENT   Type and screen [facility]  Result Value Ref Range   ABO/RH(D) A POS    Antibody Screen NEG    Sample Expiration 10/21/2017   ABO/Rh  Result Value Ref Range   ABO/RH(D) A POS    Ct Head Wo Contrast  Result Date: 10/18/2017 CLINICAL DATA:  84 year old female who awoke with dizziness and fell while walking to the bathroom. Posttraumatic headache. EXAM: CT HEAD WITHOUT CONTRAST CT CERVICAL SPINE WITHOUT CONTRAST TECHNIQUE: Multidetector CT imaging of the head and cervical spine was performed following the standard protocol without intravenous contrast. Multiplanar CT image reconstructions of the cervical spine were also generated. COMPARISON:  Chest CTA 09/18/2016 FINDINGS: CT HEAD FINDINGS Brain: Patchy and confluent bilateral cerebral white matter hypodensity associated with hypodensity in the left caudate nucleus and mild ex vacuo enlargement of the adjacent left frontal horn. Mild deep gray matter hypodensity and heterogeneity elsewhere. Small chronic appearing lacunar infarct of the inferior right cerebellum. No cortical encephalomalacia identified. No midline shift, ventriculomegaly, mass effect, evidence of mass lesion, [name], normal variant.  No skull fracture identified. Sinuses/Orbits: Mild mucosal thickening in the ethmoids. Scattered mucosal thickening and/or small mucous retention cysts in both maxillary sinuses. Otherwise clear. Other: Visualized orbit soft tissues are within normal limits. Suspected right right posterior convexity scalp soft tissue contusion (series 5, image 43). No other acute scalp soft tissue findings identified. CT CERVICAL SPINE FINDINGS Alignment: Mild straightening of  cervical lordosis.  Bilateral posterior element alignment is within normal limits. Cervicothoracic junction alignment is within normal limits. Skull base and vertebrae: Visualized skull base is intact. No atlanto-occipital dissociation. No cervical spine fracture identified. Soft tissues and spinal canal: No prevertebral fluid or swelling. No visible canal hematoma. Negative noncontrast neck soft tissues aside from calcified carotid artery atherosclerosis. Disc levels: Bulky osteophytosis along the anterior C1-odontoid articulation. Multilevel cervical facet hypertrophy greater on the right. Chronic disc space loss and endplate degeneration at C5-C6 and C6-C7. Up to mild lower cervical spinal stenosis. Upper chest: Visible upper thoracic levels appear intact. Pulmonary septal thickening in the lung apices. IMPRESSION: 1. Posterior right scalp contusion suspected. No underlying skull fracture. 2. Age indeterminate but mostly chronic appearing small vessel ischemia in the cerebral white matter, deep gray matter nuclei, and right cerebellum. 3. No acute fracture in the cervical spine. Cervical facet, disc, and endplate degeneration with up to mild degenerative lower cervical spinal stenosis. Electronically Signed   By: Genevie Ann M.D.   On: 10/18/2017 09:52   Ct Cervical Spine Wo Contrast  Result Date: 10/18/2017 CLINICAL DATA:  84 year old female who awoke with dizziness and fell while walking to the bathroom. Posttraumatic headache. EXAM: CT HEAD WITHOUT CONTRAST CT CERVICAL SPINE WITHOUT CONTRAST TECHNIQUE: Multidetector CT imaging of the head and cervical spine was performed following the standard protocol without intravenous contrast. Multiplanar CT image reconstructions of the cervical spine were also generated. COMPARISON:  Chest CTA 09/18/2016 FINDINGS: CT HEAD FINDINGS Brain: Patchy and confluent bilateral cerebral white matter hypodensity associated with hypodensity in the left caudate nucleus and mild ex vacuo enlargement of  the adjacent left frontal horn. Mild deep gray matter hypodensity and heterogeneity elsewhere. Small chronic appearing lacunar infarct of the inferior right cerebellum. No cortical encephalomalacia identified. No midline shift, ventriculomegaly, mass effect, evidence of mass lesion, [name], normal variant.  No skull fracture identified. Sinuses/Orbits: Mild mucosal thickening in the ethmoids. Scattered mucosal thickening and/or small mucous retention cysts in both maxillary sinuses. Otherwise clear. Other: Visualized orbit soft tissues are within normal limits. Suspected right right posterior convexity scalp soft tissue contusion (series 5, image 43). No other acute scalp soft tissue findings identified. CT CERVICAL SPINE FINDINGS Alignment: Mild straightening of cervical lordosis. Bilateral posterior element alignment is within normal limits. Cervicothoracic junction alignment is within normal limits. Skull base and vertebrae: Visualized skull base is intact. No atlanto-occipital dissociation. No cervical spine fracture identified. Soft tissues and spinal canal: No prevertebral fluid or swelling. No visible canal hematoma. Negative noncontrast neck soft tissues aside from calcified carotid artery atherosclerosis. Disc levels: Bulky osteophytosis along the anterior C1-odontoid articulation. Multilevel cervical facet hypertrophy greater on the right. Chronic disc space loss and endplate degeneration at C5-C6 and C6-C7. Up to mild lower cervical spinal stenosis. Upper chest: Visible upper thoracic levels appear intact. Pulmonary septal thickening in the lung apices. IMPRESSION: 1. Posterior right scalp contusion suspected. No underlying skull fracture. 2. Age indeterminate but mostly chronic appearing small vessel ischemia in the  cerebral white matter, deep gray matter nuclei, and right cerebellum. 3. No acute fracture in the cervical spine. Cervical facet, disc, and endplate degeneration with up to mild degenerative lower cervical spinal stenosis. Electronically Signed   By: Genevie Ann M.D.   On: 10/18/2017 09:52   Dg Hip Unilat With Pelvis 2-3 Views Right  Result  Date: 10/18/2017 CLINICAL DATA:  Pain following fall EXAM: DG HIP (WITH OR WITHOUT PELVIS) 2-3V RIGHT COMPARISON:  None. FINDINGS: Frontal pelvis as well as frontal and lateral right hip images were obtained. There is a subcapital femoral neck fracture on the right with mild impaction at the fracture site. No other fracture evident. No dislocation. There is a total hip replacement on the left with visualized prosthetic components on the left appearing well-seated. There is moderate narrowing of the right hip joint. Bones are diffusely osteoporotic. IMPRESSION: Subcapital femoral neck fracture on the right with impaction at the fracture site. No other acute fracture. No dislocation. Narrowing right hip joint. Bones osteoporotic. Total hip replacement on the left with prosthetic components appearing well-seated in this area. Electronically Signed   By: Lowella Grip III M.D.   On: 10/18/2017 08:21   EKG  EKG Interpretation  Date/Time:  Monday October 18 2017 07:30:44 EST Ventricular Rate:  89 PR Interval:    QRS Duration: 113 QT Interval:  391 QTC Calculation: 476 R Axis:   -44 Text Interpretation:  Sinus rhythm Probable left atrial enlargement LVH with secondary repolarization abnormality Anterior Q waves, possibly due to LVH No old tracing to compare Confirmed by [place], Inocente Salles [phone]) on 10/18/2017 7:34:07 AM       Radiology No results found.  Procedures Procedures (including critical care time)  Medications Ordered in ED Medications  fentaNYL (SUBLIMAZE) injection 50 mcg (not administered)     Initial Impression / Assessment and Plan / ED Course   I have reviewed the triage vital signs and the nursing notes.  Pertinent labs & imaging results that were available during my care of the patient were reviewed by me and considered in my medical decision making (see chart for details).     12:30 PM pain controlled after treatment with intravenous opioids.  Patient is alert Glasgow Coma Score 15 moves all extremities.  Of consulted [facility] orthopedics, PA Hilbert Odor saw patient in the ED.  Patient will be operated on by Dr. Alvan Dame.  I have also consulted hospitalist who will arrange for admission.  Intravenous potassium supplementation ordered by me. Final Clinical Impressions(s) / ED Diagnoses  Dx #1 closed subcapital fracture of right hip #2 vertigo #3 Minor closed head trauma #4 hypokalemia Final diagnoses:  None    ED Discharge Orders    None       Orlie Dakin, MD 10/18/17 1240

## 2017-10-18 NOTE — Clinical Social Work Note (Signed)
Clinical Social Work Assessment  Patient Details  Name: Barbara Kelley MRN: [id number] Date of Birth: 08/09/1934  Date of referral:  10/18/17               Reason for consult:  Facility Placement                Permission sought to share information with:  Family Supports Permission granted to share information::  Yes, Verbal Permission Granted  Name::     Nat Math   Agency::  family   Relationship::  daughter   Contact Information:     Housing/Transportation Living arrangements for the past 2 months:  Independent Living Facility([facility]) Source of Information:  Patient Patient Interpreter Needed:  None Criminal Activity/Legal Involvement Pertinent to Current Situation/Hospitalization:  No - Comment as needed Significant Relationships:  Adult Children, Other Family Members Lives with:  Self Do you feel safe going back to the place where you live?  Yes Need for family participation in patient care:  Yes (Comment)  Care giving concerns:  CSW spoke with pt and daughter at bedside. During this time CSW was informed that pt is concerned about having to have surgery as well as getting other needed medications at this time. Pt reports being on a blood pressure medication and was very anxious to receive that while CSW spoke with pt at bedside.    Social Worker assessment / plan:  CSW spoke with pt at bedside. During this time CSW was informed that pt is from [place] where pt reports living for a year. Pt expressed that after surgery pt would like to receive some short term rehab to regain the needed strength to get back to baseline. Pt expressed having support from daughter as well as other family members. Pt is wanting [facility] if rehab is needed at the time of discharge.   Employment status:  Retired Research officer, political party) PT Recommendations:  Not assessed at this time Information / Referral to community resources:  [place]  Patient/Family's Response to care:  Pt appeared to be understanding and agreeable to plan of care.   Patient/Family's Understanding of and Emotional Response to Diagnosis, Current Treatment, and Prognosis:  No further questions or concerns have been presented to CSW at this time.   Emotional Assessment Appearance:  Appears stated age Attitude/Demeanor/Rapport:    Affect (typically observed):  Anxious, Pleasant Orientation:  Oriented to Situation, Oriented to Self, Oriented to Place, Oriented to  Time Alcohol / Substance use:  Not Applicable Psych involvement (Current and /or in the community):  No (Comment)  Discharge Needs  Concerns to be addressed:  No discharge needs identified Readmission within the last 30 days:  No Current discharge risk:  None Barriers to Discharge:  Continued Medical Work up   [employer], [place] 10/18/2017, 2:00 PM

## 2017-10-18 NOTE — H&P (Signed)
History and Physical    Barbara Kelley MHD:[id number] DOB: 11/08/1933 DOA: 10/18/2017  **Will admit patient based on the expectation that the patient will need hospitalization/ hospital care that crosses at least 2 midnights  PCP: [name], MD   Attending physician: Lorin Mercy  Patient coming from/Resides with: [facility]  Chief Complaint: Fall preceded by dizziness with subsequent right femoral neck fracture  HPI: Barbara Kelley is a 84 y.o. 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  Patient reports that one week ago she stopped her Entresto secondary to increased urinary output noting she did not notify her cardiologist that she stopped this medication.  Patient reported that she got up in the middle the night to go to the bathroom became dizzy and fell.  She denies LOC.  She fell onto her right hip and noticed immediate pain.  EMS was called at the facility.  Imaging the ER revealed subcapital femoral neck fracture on the right with impaction at the fracture site.  CT of the head showed no evidence of acute stroke or bleed.  No other traumatic injuries noted other than CT evidence of a right scalp contusion.  Patient has been evaluated by orthopedic team and eventual surgical repair planned.  I have asked for internal medicine to admit the patient.  ED Course:  Vital Signs: BP (!) 148/89   Pulse 93   Temp 98 F (36.7 C) (Oral)   Resp (!) 22   Ht '4\' 11"'  (1.499 m)   Wt 56.7 kg (125 lb)   SpO2 95%   BMI 25.25 kg/m  DG right hip, CT head, CT cervical spine: As above Lab data: Sodium 140, potassium 3.3, chloride 103, CO2 25, glucose 155, BUN 11, creatinine 0.54, anion gap 12, white count 18,600 with neutrophils 87% and absolute neutrophils 16.2%, hemoglobin 14.3, platelets 184,000,  coags normal, urinalysis abnormal with hazy appearance, 5 ketones, trace of leukocytes, positive nitrite, rare bacteria, 0-5 WBCs,  type and screen completed A positive Medications and treatments: Fentanyl 50 mcg IV x2, Reglan 5 mg IV x1  Review of Systems:  In addition to the HPI above,  No Fever-chills, myalgias or other constitutional symptoms No Headache, changes with Vision or hearing, new weakness, tingling, numbness in any extremity, dysarthria or word finding difficulty, gait disturbance or imbalance, tremors or seizure activity No problems swallowing food or Liquids, indigestion/reflux, choking or coughing while eating, abdominal pain with or after eating No Chest pain, Cough or Shortness of Breath, palpitations, orthopnea or DOE No Abdominal pain, N/V, melena,hematochezia, dark tarry stools, constipation No dysuria, malodorous urine, hematuria or flank pain No new skin rashes, lesions, masses or bruises, No new joint pains, aches, swelling or redness No recent unintentional weight gain or loss No polydypsia or polyphagia   Past Medical History:  Diagnosis Date  . Arthritis   . Cardiomyopathy ([place])   . Congestive heart failure (CHF) ([place])   . GERD (gastroesophageal reflux disease)    currently non problematic   . Hypertension   . 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 Pneumonia    x2 most recent fall of 2017   . PONV (postoperative nausea and vomiting)   . Stroke [facility]) 2008   deficit of neuroplathy in left hand and left foot     Past  Surgical History:  Procedure Laterality Date  . ABDOMINAL HYSTERECTOMY    . APPENDECTOMY    . BLADDER SUSPENSION    . CARDIAC CATHETERIZATION  10/2015  . CHOLECYSTECTOMY    . JOINT REPLACEMENT Left 2013   left hip  . TONSILLECTOMY    . TOTAL KNEE ARTHROPLASTY Left 05/04/2017   Procedure: LEFT TOTAL KNEE ARTHROPLASTY;  Surgeon: Paralee Cancel, MD;  Location: [facility];  Service: Orthopedics;  Laterality: Left;  70 mins   . TUBAL LIGATION      Social History   Socioeconomic History  . Marital status: Widowed    Spouse name: Not on file  . Number of children: Not on file  . Years of education: Not on file  . Highest education level: Not on file  Social Needs  . Financial resource strain: Not on file  . Food insecurity - worry: Not on file  . Food insecurity - inability: Not on file  . Transportation needs - medical: Not on file  . Transportation needs - non-medical: Not on file  Occupational History  . Not on file  Tobacco Use  . Smoking status: Never Smoker  . Smokeless tobacco: Never Used  Substance and Sexual Activity  . Alcohol use: No  . Drug use: No  . Sexual activity: Not on file  Other Topics Concern  . Not on file  Social History Narrative  . Not on file    Mobility: Utilizes a cane Work history: Not obtained   Allergies  Allergen Reactions  . Doxycycline Nausea Only    Vertigo  . Macrobid [Nitrofurantoin] Other (See Comments)    Nerve pain  . Valsartan Other (See Comments)    Blood pressure rise  . Lisinopril Cough  . Calcium Channel Blockers Palpitations    Blow pressure rise / tachycardia   . Penicillins Rash    Has patient had a PCN reaction causing immediate rash, facial/tongue/throat swelling, SOB or lightheadedness with hypotension: Yes Has patient had a PCN reaction causing severe rash involving mucus membranes or skin necrosis: Yes Has patient had a PCN reaction that required hospitalization: Unk Has patient had a PCN reaction occurring within the last 10 years: No If all of the above answers are "NO", then may proceed with Cephalosporin use.     Family history reviewed patient and not pertinent current admission findings and diagnosis regarding fall with hip fracture  Prior to Admission medications   Medication Sig Start Date End Date Taking? Authorizing Provider  b complex vitamins tablet Take 1 tablet by mouth daily.   Yes [provider]    Calcium Carb-Cholecalciferol (CALCIUM-VITAMIN D) 500-200 MG-UNIT tablet Take 1 tablet by mouth daily.   Yes [provider]  CALCIUM-MAGNESIUM-ZINC PO Take 1 capsule by mouth daily.   Yes [provider]  carvedilol (COREG) 3.125 MG tablet Take 3.125 mg by mouth 2 (two) times daily with a meal.   Yes [provider]  Coenzyme Q10 (COQ10) 200 MG CAPS Take 1 capsule by mouth daily.   Yes [provider]  furosemide (LASIX) 20 MG tablet Take 20 mg by mouth daily.   Yes [provider]  GINKGO BILOBA PO Take 1 capsule by mouth 2 (two) times daily.   Yes [provider]  Multiple Vitamins-Minerals (MULTIVITAMIN WITH MINERALS) tablet Take 1 tablet by mouth daily.   Yes [provider]  Omega-3 Fatty Acids (FISH OIL PO) Take 1 capsule by mouth daily.   Yes [provider]  Probiotic Product (PROBIOTIC PO) Take 1 tablet by mouth daily.   Yes [provider]  sacubitril-valsartan (ENTRESTO) 24-26 MG Take 1 tablet by mouth daily.   Yes [provider]  docusate sodium (COLACE) 100 MG capsule Take 1 capsule (100 mg total) by mouth 2 (two) times daily. Patient not taking: Reported on 10/18/2017 05/06/17   Danae Orleans, PA-C  ferrous sulfate 325 (65 FE) MG tablet Take 1 tablet (325 mg total) by mouth 3 (three) times daily after meals. Patient not taking: Reported on 10/18/2017 05/06/17   Danae Orleans, PA-C  HYDROcodone-acetaminophen (NORCO) 7.5-325 MG tablet Take 1-2 tablets by mouth every 4 (four) hours as needed for moderate pain. Patient not taking: Reported on 10/18/2017 05/06/17   Danae Orleans, PA-C  methocarbamol (ROBAXIN) 500 MG tablet Take 1 tablet (500 mg total) by mouth every 6 (six) hours as needed for muscle spasms. Patient not taking: Reported on 10/18/2017 05/06/17   Danae Orleans, PA-C  polyethylene glycol (MIRALAX / GLYCOLAX) packet Take 17 g by mouth 2 (two) times daily. Patient not taking: Reported on  10/18/2017 05/06/17   Danae Orleans, PA-C    Physical Exam: Vitals:   10/18/17 1100 10/18/17 1130 10/18/17 1145 10/18/17 1200  BP: (!) 143/86 (!) 151/86 139/87 (!) 148/89  Pulse: 90 94 93 93  Resp: 15 (!) 21 (!) 21 (!) 22  Temp:      TempSrc:      SpO2: 95%     Weight:      Height:          Constitutional: NAD, calm, uncomfortable reporting low back pain secondary to laying flat on hospital stretcher Eyes: PERRL, lids and conjunctivae normal ENMT: Mucous membranes are moist. Posterior pharynx clear of any exudate or lesions.age-appropriate dentition.  Neck: normal, supple, no masses, no thyromegaly Respiratory: clear to auscultation bilaterally, no wheezing, no crackles. Normal respiratory effort. No accessory muscle use.  Cardiovascular: Regular rate and rhythm, no murmurs / rubs / gallops. No extremity edema. 2+ pedal pulses. No carotid bruits.  Abdomen: no tenderness, no masses palpated. No hepatosplenomegaly. Bowel sounds positive.  Musculoskeletal: no clubbing / cyanosis. No joint deformity upper extremities.  Left lower extremity unremarkable, right lower extremity with internal rotation and shortening in context of known right subcapital femoral neck fracture good ROM noting right lower extremity not tested secondary to known fracture, no contractures. Normal muscle tone.  Skin: no rashes, lesions, ulcers. No induration Neurologic: CN 2-12 grossly intact. Sensation intact, DTR normal. Strength 5/5 x all 4 extremities except for notable left hand grip 4+/5 which is chronic based on patient report of post stroke neuropathy.  Psychiatric: Normal judgment and insight. Alert and oriented x 3. Normal mood.    Labs on Admission: I have personally reviewed following labs and imaging studies  CBC: Recent Labs  Lab 10/18/17 0713  WBC 18.6*  NEUTROABS 16.2*  HGB 14.3  HCT 44.4  MCV 87.7  PLT 993   Basic Metabolic Panel: Recent Labs  Lab 10/18/17 0713  NA 140  K 3.3*  CL  103  CO2 25  GLUCOSE 155*  BUN 11  CREATININE 0.54  CALCIUM 9.4   GFR: Estimated Creatinine Clearance: 42.3 mL/min (by C-G formula based on SCr of 0.54 mg/dL). Liver Function Tests: No results for input(s): AST, ALT, ALKPHOS, BILITOT, PROT, ALBUMIN in the last 168 hours. No results for input(s): LIPASE, AMYLASE in the last 168 hours. No results for input(s): AMMONIA in the last 168 hours. Coagulation Profile:  Recent Labs  Lab 10/18/17 0713  INR 0.99   Cardiac Enzymes: No results for input(s): CKTOTAL, CKMB, CKMBINDEX, TROPONINI in the last 168 hours. BNP (last 3 results) No results for input(s): PROBNP in the last 8760 hours. HbA1C: No results for input(s): HGBA1C in the last 72 hours. CBG: No results for input(s): GLUCAP in the last 168 hours. Lipid Profile: No results for input(s): CHOL, HDL, LDLCALC, TRIG, CHOLHDL, LDLDIRECT in the last 72 hours. Thyroid Function Tests: No results for input(s): TSH, T4TOTAL, FREET4, T3FREE, THYROIDAB in the last 72 hours. Anemia Panel: No results for input(s): VITAMINB12, FOLATE, FERRITIN, TIBC, IRON, RETICCTPCT in the last 72 hours. Urine analysis:    Component Value Date/Time   COLORURINE YELLOW 10/18/2017 0716   APPEARANCEUR HAZY (A) 10/18/2017 0716   LABSPEC 1.012 10/18/2017 0716   PHURINE 7.0 10/18/2017 0716   GLUCOSEU NEGATIVE 10/18/2017 0716   HGBUR NEGATIVE 10/18/2017 0716   BILIRUBINUR NEGATIVE 10/18/2017 0716   KETONESUR 5 (A) 10/18/2017 0716   PROTEINUR NEGATIVE 10/18/2017 0716   NITRITE POSITIVE (A) 10/18/2017 0716   LEUKOCYTESUR TRACE (A) 10/18/2017 0716   Sepsis Labs: '@LABRCNTIP' (procalcitonin:4,lacticidven:4) )No results found for this or any previous visit (from the past 240 hour(s)).   Radiological Exams on Admission: Ct Head Wo Contrast  Result Date: 10/18/2017 CLINICAL DATA:  84 year old female who awoke with dizziness and fell while walking to the bathroom. Posttraumatic headache. EXAM: CT HEAD WITHOUT  CONTRAST CT CERVICAL SPINE WITHOUT CONTRAST TECHNIQUE: Multidetector CT imaging of the head and cervical spine was performed following the standard protocol without intravenous contrast. Multiplanar CT image reconstructions of the cervical spine were also generated. COMPARISON:  Chest CTA 09/18/2016 FINDINGS: CT HEAD FINDINGS Brain: Patchy and confluent bilateral cerebral white matter hypodensity associated with hypodensity in the left caudate nucleus and mild ex vacuo enlargement of the adjacent left frontal horn. Mild deep gray matter hypodensity and heterogeneity elsewhere. Small chronic appearing lacunar infarct of the inferior right cerebellum. No cortical encephalomalacia identified. No midline shift, ventriculomegaly, mass effect, evidence of mass lesion, [name], normal variant.  No skull fracture identified. Sinuses/Orbits: Mild mucosal thickening in the ethmoids. Scattered mucosal thickening and/or small mucous retention cysts in both maxillary sinuses. Otherwise clear. Other: Visualized orbit soft tissues are within normal limits. Suspected right right posterior convexity scalp soft tissue contusion (series 5, image 43). No other acute scalp soft tissue findings identified. CT CERVICAL SPINE FINDINGS Alignment: Mild straightening of cervical lordosis. Bilateral posterior element alignment is within normal limits. Cervicothoracic junction alignment is within normal limits. Skull base and vertebrae: Visualized skull base is intact. No atlanto-occipital dissociation. No cervical spine fracture identified. Soft tissues and spinal canal: No prevertebral fluid or swelling. No visible canal hematoma. Negative noncontrast neck soft tissues aside from calcified carotid artery atherosclerosis. Disc levels: Bulky osteophytosis  along the anterior C1-odontoid articulation. Multilevel cervical facet hypertrophy greater on the right. Chronic disc space loss and endplate degeneration at C5-C6 and C6-C7. Up to mild lower cervical spinal stenosis. Upper chest: Visible upper thoracic levels appear intact. Pulmonary septal thickening in the lung apices. IMPRESSION: 1. Posterior right scalp contusion suspected. No underlying skull fracture. 2. Age indeterminate but mostly chronic appearing small vessel ischemia in the cerebral white matter, deep gray matter nuclei, and right cerebellum. 3. No acute fracture in the cervical spine. Cervical facet, disc, and endplate degeneration with up  to mild degenerative lower cervical spinal stenosis. Electronically Signed   By: Genevie Ann M.D.   On: 10/18/2017 09:52   Ct Cervical Spine Wo Contrast  Result Date: 10/18/2017 CLINICAL DATA:  84 year old female who awoke with dizziness and fell while walking to the bathroom. Posttraumatic headache. EXAM: CT HEAD WITHOUT CONTRAST CT CERVICAL SPINE WITHOUT CONTRAST TECHNIQUE: Multidetector CT imaging of the head and cervical spine was performed following the standard protocol without intravenous contrast. Multiplanar CT image reconstructions of the cervical spine were also generated. COMPARISON:  Chest CTA 09/18/2016 FINDINGS: CT HEAD FINDINGS Brain: Patchy and confluent bilateral cerebral white matter hypodensity associated with hypodensity in the left caudate nucleus and mild ex vacuo enlargement of the adjacent left frontal horn. Mild deep gray matter hypodensity and heterogeneity elsewhere. Small chronic appearing lacunar infarct of the inferior right cerebellum. No cortical encephalomalacia identified. No midline shift, ventriculomegaly, mass effect, evidence of mass lesion, intracranial hemorrhage or evidence of cortically based acute infarction. Vascular: Calcified atherosclerosis at the skull base. No suspicious intracranial vascular hyperdensity. Skull:  Hyperostosis, normal variant.  No skull fracture identified. Sinuses/Orbits: Mild mucosal thickening in the ethmoids. Scattered mucosal thickening and/or small mucous retention cysts in both maxillary sinuses. Otherwise clear. Other: Visualized orbit soft tissues are within normal limits. Suspected right right posterior convexity scalp soft tissue contusion (series 5, image 43). No other acute scalp soft tissue findings identified. CT CERVICAL SPINE FINDINGS Alignment: Mild straightening of cervical lordosis. Bilateral posterior element alignment is within normal limits. Cervicothoracic junction alignment is within normal limits. Skull base and vertebrae: Visualized skull base is intact. No atlanto-occipital dissociation. No cervical spine fracture identified. Soft tissues and spinal canal: No prevertebral fluid or swelling. No visible canal hematoma. Negative noncontrast neck soft tissues aside from calcified carotid artery atherosclerosis. Disc levels: Bulky osteophytosis along the anterior C1-odontoid articulation. Multilevel cervical facet hypertrophy greater on the right. Chronic disc space loss and endplate degeneration at C5-C6 and C6-C7. Up to mild lower cervical spinal stenosis. Upper chest: Visible upper thoracic levels appear intact. Pulmonary septal thickening in the lung apices. IMPRESSION: 1. Posterior right scalp contusion suspected. No underlying skull fracture. 2. Age indeterminate but mostly chronic appearing small vessel ischemia in the cerebral white matter, deep gray matter nuclei, and right cerebellum. 3. No acute fracture in the cervical spine. Cervical facet, disc, and endplate degeneration with up to mild degenerative lower cervical spinal stenosis. Electronically Signed   By: Genevie Ann M.D.   On: 10/18/2017 09:52   Dg Hip Unilat With Pelvis 2-3 Views Right  Result Date: 10/18/2017 CLINICAL DATA:  Pain following fall EXAM: DG HIP (WITH OR WITHOUT PELVIS) 2-3V RIGHT COMPARISON:  None.  FINDINGS: Frontal pelvis as well as frontal and lateral right hip images were obtained. There is a subcapital femoral neck fracture on the right with mild impaction at the fracture site. No other fracture evident. No dislocation. There is a total hip replacement on the left with visualized prosthetic components on the left appearing well-seated. There is moderate narrowing of the right hip joint. Bones are diffusely osteoporotic. IMPRESSION: Subcapital femoral neck fracture on the right with impaction at the fracture site. No other acute fracture. No dislocation. Narrowing right hip joint. Bones osteoporotic. Total hip replacement on the left with prosthetic components appearing well-seated in this area. Electronically Signed   By: Lowella Grip III M.D.   On: 10/18/2017 08:21    EKG: (Independently reviewed) sinus rhythm with ventricular rate 89 bpm, QTC 476 ms,  voltage criteria met for LVH, normal R wave rotation without any acute ischemic changes  Assessment/Plan Principal Problem:   Closed subcapital fracture of neck of femur, right, initial encounter  -Patient experienced fall onto right hip after becoming dizzy with subsequent radiographic evidence of right subcapital femoral neck fracture with impaction -Orthopedic team plans surgical repair -We will need formal PT/OT evaluation postoperatively-patient currently resides at independent living -Osteoporosis labs: Calcium, albumin and vitamin D hydroxy -Fentanyl IV 25 mcg every 2 hours prn severe pain -Robaxin IV 500 mg every 6 hours prn muscle spasms -NPO until timing of surgery determined; low flow IV fluid 50 cc/hr while NPO -Nonweightbearing -Revised Cardiac Risk Index High risk surgery: No History of ischemic heart disease: No History of congestive heart failure: Yes History of cerebrovascular disease: Yes Pre-operative treatment with insulin: No Pre-operative creatinine >2 mg/dL (176.8 umol/L): No 2 points. Risk class III.  Moderate risk. 6.6% risk of major cardiac event. -Patient clinically cleared to proceed with operative intervention for femoral neck fracture; patient essentially functionally independent prior to fall only requiring a cane to mobilize secondary to known left foot neuropathy post stroke 11 years prior.  Risk of surgery does not outweigh benefit of proceeding with surgery in a patient with moderate risk noting if fracture not repaired this would severely limit patient's independence and mobility.  Active Problems:   History of CVA (cerebrovascular accident)/2007 -Patient reports chronic left hand and foot neuropathy after stroke    Near syncope -Patient reports got up to go to restroom and became dizzy and fell without loss of consciousness; no similar symptoms -Ports recently stopped Entresto secondary to increased urinary output-patient on Lasix as well and suspect patient may have experienced orthostasis-given acute femoral neck fracture with impaction unable to obtain orthostatic vital signs -For completeness of evaluation cycle troponin noting patient reported underwent cardiac catheterization 3 years ago in [place] with no evidence of CAD -Echocardiogram -Currently no focal neurological deficits noted on exam    Hypertension -Moderately controlled -Patient stopped Entresto 1 week ago due to reports of excessive urinary output (see below)    NICM (nonischemic cardiomyopathy)/Chronic systolic heart failure  -Followed by Dr. Wynonia Lawman as an outpatient -Last echocardiogram documented with EF 25-30%-result not available in chart but patient thinks was completed June 2018 -As noted above patient reports no evidence of CAD on cardiac catheterization 3 years ago in [place] -Patient stopped Delene Loll as above-consider resuming during this hospitalization-of note patient stopped medication on her own and did not notify Dr. Wynonia Lawman -Patient currently NPO and will be given low rate IV fluids-no evidence  of heart failure exacerbation of peripheral edema and given concerns over possible orthostatic related dizziness leading to fall will hold Lasix for now -Continue carvedilol    HLD (hyperlipidemia) -Continue omega-3 fatty acids      DVT prophylaxis: SCDs Code Status: Full Family Communication: No family at bedside Disposition Plan: TBD-patient was at independent living facility prior to admission and discharge disposition will be based on postoperative PT/OT evaluation Consults called: Orthopedic team/[place]    Basilio Meadow L. ANP-BC [facility] Pager [phone]   If 7PM-7AM, please contact night-coverage [email or web address] Password [email or web address]  10/18/2017, 1:03 PM

## 2017-10-19 ENCOUNTER — Encounter (HOSPITAL_COMMUNITY): Payer: Self-pay | Admitting: Anesthesiology

## 2017-10-19 ENCOUNTER — Inpatient Hospital Stay (HOSPITAL_COMMUNITY): Payer: Medicare Other

## 2017-10-19 ENCOUNTER — Encounter (HOSPITAL_COMMUNITY)
Admission: EM | Disposition: A | Discharge status: Home Health | Payer: Self-pay | Source: Home / Self Care | Attending: Internal Medicine

## 2017-10-19 ENCOUNTER — Inpatient Hospital Stay (HOSPITAL_COMMUNITY): Payer: Medicare Other | Admitting: Certified Registered Nurse Anesthetist

## 2017-10-19 DIAGNOSIS — I10 Essential (primary) hypertension: Secondary | ICD-10-CM

## 2017-10-19 DIAGNOSIS — S72001A Fracture of unspecified part of neck of right femur, initial encounter for closed fracture: Secondary | ICD-10-CM

## 2017-10-19 DIAGNOSIS — E785 Hyperlipidemia, unspecified: Secondary | ICD-10-CM

## 2017-10-19 DIAGNOSIS — I428 Other cardiomyopathies: Secondary | ICD-10-CM

## 2017-10-19 DIAGNOSIS — I5022 Chronic systolic (congestive) heart failure: Secondary | ICD-10-CM

## 2017-10-19 DIAGNOSIS — Z8673 Personal history of transient ischemic attack (TIA), and cerebral infarction without residual deficits: Secondary | ICD-10-CM

## 2017-10-19 DIAGNOSIS — R55 Syncope and collapse: Secondary | ICD-10-CM

## 2017-10-19 DIAGNOSIS — I340 Nonrheumatic mitral (valve) insufficiency: Secondary | ICD-10-CM

## 2017-10-19 HISTORY — PX: HIP ARTHROPLASTY: SHX981

## 2017-10-19 LAB — BASIC METABOLIC PANEL WITH GFR
Anion gap: 10 (ref 5–15)
BUN: 9 mg/dL (ref 6–20)
CO2: 24 mmol/L (ref 22–32)
Calcium: 9.1 mg/dL (ref 8.9–10.3)
Chloride: 103 mmol/L (ref 101–111)
Creatinine, Ser: 0.47 mg/dL (ref 0.44–1.00)
GFR calc Af Amer: >60 mL/min
GFR calc non Af Amer: >60 mL/min
Glucose, Bld: 142 mg/dL — ABNORMAL HIGH (ref 65–99)
Potassium: 3.5 mmol/L (ref 3.5–5.1)
Sodium: 137 mmol/L (ref 135–145)

## 2017-10-19 LAB — CBC
HCT: 39.2 % (ref 36.0–46.0)
Hemoglobin: 12.6 g/dL (ref 12.0–15.0)
MCH: 28 pg (ref 26.0–34.0)
MCHC: 32.1 g/dL (ref 30.0–36.0)
MCV: 87.1 fL (ref 78.0–100.0)
PLATELETS: 178 10*3/uL (ref 150–400)
RBC: 4.5 MIL/uL (ref 3.87–5.11)
RDW: 13.4 % (ref 11.5–15.5)
WBC: 15 10*3/uL — ABNORMAL HIGH (ref 4.0–10.5)

## 2017-10-19 LAB — SURGICAL PCR SCREEN
MRSA, PCR: NEGATIVE
Staphylococcus aureus: NEGATIVE

## 2017-10-19 LAB — VITAMIN D 25 HYDROXY (VIT D DEFICIENCY, FRACTURES): Vit D, 25-Hydroxy: 51.5 ng/mL (ref 30.0–100.0)

## 2017-10-19 SURGERY — HEMIARTHROPLASTY, HIP, DIRECT ANTERIOR APPROACH, FOR FRACTURE
Anesthesia: Spinal | Site: Hip | Laterality: Right

## 2017-10-19 MED ORDER — METHOCARBAMOL 500 MG PO TABS
500.0000 mg | ORAL_TABLET | Freq: Four times a day (QID) | ORAL | Status: DC | PRN
Start: 2017-10-19 — End: 2017-10-21

## 2017-10-19 MED ORDER — POLYETHYLENE GLYCOL 3350 17 G PO PACK: 17.0000 g | PACK | Freq: Every day | ORAL | Status: DC | PRN

## 2017-10-19 MED ORDER — SODIUM CHLORIDE 0.9 % IV SOLN
INTRAVENOUS | Status: DC
  Administered 2017-10-19: 21:00:00 via INTRAVENOUS

## 2017-10-19 MED ORDER — MENTHOL 3 MG MT LOZG: 1.0000 | LOZENGE | OROMUCOSAL | Status: DC | PRN

## 2017-10-19 MED ORDER — ALUM & MAG HYDROXIDE-SIMETH 200-200-20 MG/5ML PO SUSP: 30.0000 mL | ORAL | Status: DC | PRN

## 2017-10-19 MED ORDER — TRAMADOL HCL 50 MG PO TABS: 50.0000 mg | ORAL_TABLET | Freq: Four times a day (QID) | ORAL | Status: DC | PRN

## 2017-10-19 MED ORDER — CEFAZOLIN SODIUM-DEXTROSE 2-4 GM/100ML-% IV SOLN
2.0000 g | INTRAVENOUS | Status: AC
  Administered 2017-10-19: 2 g via INTRAVENOUS
  Filled 2017-10-19: qty 100

## 2017-10-19 MED ORDER — ONDANSETRON HCL 4 MG/2ML IJ SOLN
INTRAMUSCULAR | Status: DC | PRN
  Administered 2017-10-19: 4 mg via INTRAVENOUS

## 2017-10-19 MED ORDER — CALCIUM-MAGNESIUM-ZINC 333-133-5 MG PO TABS: ORAL_TABLET | Freq: Every day | ORAL | Status: DC

## 2017-10-19 MED ORDER — FENTANYL CITRATE (PF) 250 MCG/5ML IJ SOLN
INTRAMUSCULAR | Status: AC
  Filled 2017-10-19: qty 5

## 2017-10-19 MED ORDER — ONDANSETRON HCL 4 MG PO TABS: 4.0000 mg | ORAL_TABLET | Freq: Four times a day (QID) | ORAL | Status: DC | PRN

## 2017-10-19 MED ORDER — B COMPLEX PO TABS: 1.0000 | ORAL_TABLET | Freq: Every day | ORAL | Status: DC

## 2017-10-19 MED ORDER — ACETAMINOPHEN 325 MG PO TABS: 650.0000 mg | ORAL_TABLET | Freq: Four times a day (QID) | ORAL | Status: DC | PRN

## 2017-10-19 MED ORDER — CHLORHEXIDINE GLUCONATE 4 % EX LIQD
60.0000 mL | Freq: Once | CUTANEOUS | Status: AC
  Administered 2017-10-19: 1 via TOPICAL
  Filled 2017-10-19: qty 15

## 2017-10-19 MED ORDER — ACETAMINOPHEN 500 MG PO TABS
1000.0000 mg | ORAL_TABLET | Freq: Four times a day (QID) | ORAL | Status: DC
  Administered 2017-10-19 – 2017-10-21 (×8): 1000 mg via ORAL
  Filled 2017-10-19 (×8): qty 2

## 2017-10-19 MED ORDER — SODIUM CHLORIDE 0.9 % IR SOLN
Status: DC | PRN
  Administered 2017-10-19: 1000 mL

## 2017-10-19 MED ORDER — CEFAZOLIN SODIUM-DEXTROSE 1-4 GM/50ML-% IV SOLN
1.0000 g | Freq: Four times a day (QID) | INTRAVENOUS | Status: AC
  Administered 2017-10-19 – 2017-10-20 (×2): 1 g via INTRAVENOUS
  Filled 2017-10-19 (×2): qty 50

## 2017-10-19 MED ORDER — ASPIRIN EC 325 MG PO TBEC: 325.0000 mg | DELAYED_RELEASE_TABLET | Freq: Every day | ORAL | Status: DC

## 2017-10-19 MED ORDER — PROPOFOL 10 MG/ML IV BOLUS
INTRAVENOUS | Status: AC
  Filled 2017-10-19: qty 20

## 2017-10-19 MED ORDER — HYDRALAZINE HCL 20 MG/ML IJ SOLN
10.0000 mg | Freq: Four times a day (QID) | INTRAMUSCULAR | Status: DC | PRN
  Administered 2017-10-20: 10 mg via INTRAVENOUS
  Filled 2017-10-19: qty 1

## 2017-10-19 MED ORDER — DEXAMETHASONE SODIUM PHOSPHATE 10 MG/ML IJ SOLN
INTRAMUSCULAR | Status: DC | PRN
  Administered 2017-10-19: 10 mg via INTRAVENOUS

## 2017-10-19 MED ORDER — ACETAMINOPHEN 650 MG RE SUPP: 650.0000 mg | Freq: Four times a day (QID) | RECTAL | Status: DC | PRN

## 2017-10-19 MED ORDER — PHENYLEPHRINE HCL 10 MG/ML IJ SOLN
INTRAMUSCULAR | Status: DC | PRN
  Administered 2017-10-19: 25 ug/min via INTRAVENOUS

## 2017-10-19 MED ORDER — PHENOL 1.4 % MT LIQD: 1.0000 | OROMUCOSAL | Status: DC | PRN

## 2017-10-19 MED ORDER — ONDANSETRON HCL 4 MG/2ML IJ SOLN: 4.0000 mg | Freq: Four times a day (QID) | INTRAMUSCULAR | Status: DC | PRN

## 2017-10-19 MED ORDER — DEXTROSE 5 % IV SOLN
500.0000 mg | Freq: Four times a day (QID) | INTRAVENOUS | Status: DC | PRN
  Filled 2017-10-19: qty 5

## 2017-10-19 MED ORDER — BUPIVACAINE IN DEXTROSE 0.75-8.25 % IT SOLN
INTRATHECAL | Status: DC | PRN
  Administered 2017-10-19: 1.8 mL via INTRATHECAL

## 2017-10-19 MED ORDER — POVIDONE-IODINE 10 % EX SWAB: 2.0000 "application " | Freq: Once | CUTANEOUS | Status: DC

## 2017-10-19 MED ORDER — FENTANYL CITRATE (PF) 100 MCG/2ML IJ SOLN
INTRAMUSCULAR | Status: DC | PRN
  Administered 2017-10-19 (×2): 25 ug via INTRAVENOUS

## 2017-10-19 MED ORDER — PROPOFOL 500 MG/50ML IV EMUL
INTRAVENOUS | Status: DC | PRN
  Administered 2017-10-19: 100 ug/kg/min via INTRAVENOUS

## 2017-10-19 MED ORDER — METOCLOPRAMIDE HCL 5 MG/ML IJ SOLN: 5.0000 mg | Freq: Three times a day (TID) | INTRAMUSCULAR | Status: DC | PRN

## 2017-10-19 MED ORDER — DOCUSATE SODIUM 100 MG PO CAPS
100.0000 mg | ORAL_CAPSULE | Freq: Two times a day (BID) | ORAL | Status: DC
  Administered 2017-10-19 – 2017-10-21 (×4): 100 mg via ORAL
  Filled 2017-10-19 (×4): qty 1

## 2017-10-19 MED ORDER — LACTATED RINGERS IV SOLN
INTRAVENOUS | Status: DC
  Administered 2017-10-19: 16:00:00 via INTRAVENOUS

## 2017-10-19 MED ORDER — PERFLUTREN LIPID MICROSPHERE
1.0000 mL | INTRAVENOUS | Status: AC | PRN
  Administered 2017-10-19: 2 mL via INTRAVENOUS
  Filled 2017-10-19: qty 10

## 2017-10-19 MED ORDER — METOCLOPRAMIDE HCL 5 MG PO TABS: 5.0000 mg | ORAL_TABLET | Freq: Three times a day (TID) | ORAL | Status: DC | PRN

## 2017-10-19 SURGICAL SUPPLY — 48 items
BLADE SAW SGTL 18X1.27X75 (BLADE) ×2 IMPLANT
CAPT HIP HEMI 2 ×2 IMPLANT
COVER SURGICAL LIGHT HANDLE (MISCELLANEOUS) ×2 IMPLANT
DERMABOND ADVANCED (GAUZE/BANDAGES/DRESSINGS) ×1
DERMABOND ADVANCED .7 DNX12 (GAUZE/BANDAGES/DRESSINGS) ×1 IMPLANT
DRAPE IMP U-DRAPE 54X76 (DRAPES) IMPLANT
DRAPE INCISE IOBAN 85X60 (DRAPES) IMPLANT
DRAPE ORTHO SPLIT 77X108 STRL (DRAPES) ×2
DRAPE SURG ORHT 6 SPLT 77X108 (DRAPES) ×2 IMPLANT
DRAPE U-SHAPE 47X51 STRL (DRAPES) ×2 IMPLANT
DRESSING AQUACEL AG SP 3.5X6 (GAUZE/BANDAGES/DRESSINGS) ×1 IMPLANT
DRSG AQUACEL AG ADV 3.5X10 (GAUZE/BANDAGES/DRESSINGS) ×2 IMPLANT
DRSG AQUACEL AG SP 3.5X6 (GAUZE/BANDAGES/DRESSINGS) ×2
DURAPREP 26ML APPLICATOR (WOUND CARE) ×2 IMPLANT
ELECT BLADE 4.0 EZ CLEAN MEGAD (MISCELLANEOUS)
ELECT REM PT RETURN 9FT ADLT (ELECTROSURGICAL) ×2
ELECTRODE BLDE 4.0 EZ CLN MEGD (MISCELLANEOUS) IMPLANT
ELECTRODE REM PT RTRN 9FT ADLT (ELECTROSURGICAL) ×1 IMPLANT
FACESHIELD WRAPAROUND (MASK) ×4 IMPLANT
GLOVE BIOGEL PI IND STRL 7.5 (GLOVE) ×1 IMPLANT
GLOVE BIOGEL PI IND STRL 8.5 (GLOVE) ×2 IMPLANT
GLOVE BIOGEL PI INDICATOR 7.5 (GLOVE) ×1
GLOVE BIOGEL PI INDICATOR 8.5 (GLOVE) ×2
GLOVE ECLIPSE 8.0 STRL XLNG CF (GLOVE) ×2 IMPLANT
GLOVE ORTHO TXT STRL SZ7.5 (GLOVE) ×2 IMPLANT
GOWN STRL REUS W/ TWL LRG LVL3 (GOWN DISPOSABLE) ×3 IMPLANT
GOWN STRL REUS W/TWL 2XL LVL3 (GOWN DISPOSABLE) ×2 IMPLANT
GOWN STRL REUS W/TWL LRG LVL3 (GOWN DISPOSABLE) ×3
HANDPIECE INTERPULSE COAX TIP (DISPOSABLE)
IMMOBILIZER KNEE 22 UNIV (SOFTGOODS) ×2 IMPLANT
KIT BASIN OR (CUSTOM PROCEDURE TRAY) ×2 IMPLANT
KIT ROOM TURNOVER OR (KITS) ×2 IMPLANT
MANIFOLD NEPTUNE II (INSTRUMENTS) ×2 IMPLANT
NS IRRIG 1000ML POUR BTL (IV SOLUTION) ×2 IMPLANT
PACK TOTAL JOINT (CUSTOM PROCEDURE TRAY) ×2 IMPLANT
PAD ARMBOARD 7.5X6 YLW CONV (MISCELLANEOUS) ×4 IMPLANT
SET HNDPC FAN SPRY TIP SCT (DISPOSABLE) IMPLANT
SPONGE LAP 4X18 X RAY DECT (DISPOSABLE) IMPLANT
SUT MNCRL AB 4-0 PS2 18 (SUTURE) IMPLANT
SUT VIC AB 1 CT1 27 (SUTURE) ×1
SUT VIC AB 1 CT1 27XBRD ANBCTR (SUTURE) ×1 IMPLANT
SUT VIC AB 2-0 CT1 27 (SUTURE) ×2
SUT VIC AB 2-0 CT1 TAPERPNT 27 (SUTURE) ×2 IMPLANT
SUT VLOC 180 0 18 (SUTURE) ×2 IMPLANT
TOWEL OR 17X24 6PK STRL BLUE (TOWEL DISPOSABLE) ×2 IMPLANT
TOWEL OR 17X26 10 PK STRL BLUE (TOWEL DISPOSABLE) ×2 IMPLANT
TRAY FOLEY CATH SILVER 14FR (SET/KITS/TRAYS/PACK) ×2 IMPLANT
WATER STERILE IRR 1000ML POUR (IV SOLUTION) IMPLANT

## 2017-10-19 NOTE — Anesthesia Preprocedure Evaluation (Addendum)
Anesthesia Evaluation  Patient identified by MRN, date of birth, ID band Patient awake    Reviewed: Allergy & Precautions, NPO status , Patient's Chart, lab work & pertinent test results  History of Anesthesia Complications (+) PONV  Airway Mallampati: I       Dental no notable dental hx. (+) Teeth Intact   Pulmonary    Pulmonary exam normal breath sounds clear to auscultation       Cardiovascular hypertension, Pt. on medications and Pt. on home beta blockers +CHF  Normal cardiovascular exam Rhythm:Regular Rate:Tachycardia     Neuro/Psych    GI/Hepatic Neg liver ROS,   Endo/Other  negative endocrine ROS  Renal/GU negative Renal ROS  negative genitourinary   Musculoskeletal   Abdominal Normal abdominal exam  (+)   Peds  Hematology negative hematology ROS (+)   Anesthesia Other Findings   Reproductive/Obstetrics                                                             Anesthesia Evaluation  Patient identified by MRN, date of birth, ID band Patient awake    Reviewed: Allergy & Precautions, NPO status , Patient's Chart, lab work & pertinent test results  Airway Mallampati: II  TM Distance: >3 FB Neck ROM: Full    Dental  (+) Teeth Intact, Dental Advisory Given   Pulmonary    breath sounds clear to auscultation       Cardiovascular hypertension,  Rhythm:Regular Rate:Normal     Neuro/Psych    GI/Hepatic   Endo/Other    Renal/GU      Musculoskeletal   Abdominal   Peds  Hematology   Anesthesia Other Findings   Reproductive/Obstetrics                             Anesthesia Physical Anesthesia Plan  ASA: III  Anesthesia Plan: MAC and Spinal   Post-op Pain Management:    Induction:   PONV Risk Score and Plan: Ondansetron  Airway Management Planned: Natural Airway and Simple Face Mask  Additional Equipment:    Intra-op Plan:   Post-operative Plan:   Informed Consent: I have reviewed the patients History and Physical, chart, labs and discussed the procedure including the risks, benefits and alternatives for the proposed anesthesia with the patient or authorized representative who has indicated his/her understanding and acceptance.   Dental advisory given  Plan Discussed with: CRNA and Anesthesiologist  Anesthesia Plan Comments:         Anesthesia Quick Evaluation                                   Anesthesia Evaluation  Patient identified by MRN, date of birth, ID band Patient awake    Reviewed: Allergy & Precautions, NPO status , Patient's Chart, lab work & pertinent test results  Airway Mallampati: II  TM Distance: >3 FB Neck ROM: Full    Dental  (+) Teeth Intact, Dental Advisory Given   Pulmonary    breath sounds clear to auscultation       Cardiovascular hypertension,  Rhythm:Regular Rate:Normal     Neuro/Psych    GI/Hepatic   Endo/Other    Renal/GU  Musculoskeletal   Abdominal   Peds  Hematology   Anesthesia Other Findings   Reproductive/Obstetrics                             Anesthesia Physical Anesthesia Plan  ASA: III  Anesthesia Plan: MAC and Spinal   Post-op Pain Management:    Induction:   PONV Risk Score and Plan: Ondansetron  Airway Management Planned: Natural Airway and Simple Face Mask  Additional Equipment:   Intra-op Plan:   Post-operative Plan:   Informed Consent: I have reviewed the patients History and Physical, chart, labs and discussed the procedure including the risks, benefits and alternatives for the proposed anesthesia with the patient or authorized representative who has indicated his/her understanding and acceptance.   Dental advisory given  Plan Discussed with: CRNA and Anesthesiologist  Anesthesia Plan Comments:         Anesthesia Quick Evaluation                                    Anesthesia Evaluation  Patient identified by MRN, date of birth, ID band Patient awake    Reviewed: Allergy & Precautions, NPO status , Patient's Chart, lab work & pertinent test results  Airway Mallampati: II  TM Distance: >3 FB Neck ROM: Full    Dental  (+) Teeth Intact, Dental Advisory Given   Pulmonary    breath sounds clear to auscultation       Cardiovascular hypertension,  Rhythm:Regular Rate:Normal     Neuro/Psych    GI/Hepatic   Endo/Other    Renal/GU      Musculoskeletal   Abdominal   Peds  Hematology   Anesthesia Other Findings   Reproductive/Obstetrics                             Anesthesia Physical Anesthesia Plan  ASA: III  Anesthesia Plan: MAC and Spinal   Post-op Pain Management:    Induction:   PONV Risk Score and Plan: Ondansetron  Airway Management Planned: Natural Airway and Simple Face Mask  Additional Equipment:   Intra-op Plan:   Post-operative Plan:   Informed Consent: I have reviewed the patients History and Physical, chart, labs and discussed the procedure including the risks, benefits and alternatives for the proposed anesthesia with the patient or authorized representative who has indicated his/her understanding and acceptance.   Dental advisory given  Plan Discussed with: CRNA and Anesthesiologist  Anesthesia Plan Comments:         Anesthesia Quick Evaluation                                   Anesthesia Evaluation  Patient identified by MRN, date of birth, ID band Patient awake    Reviewed: Allergy & Precautions, NPO status , Patient's Chart, lab work & pertinent test results  Airway Mallampati: II  TM Distance: >3 FB Neck ROM: Full    Dental  (+) Teeth Intact, Dental Advisory Given   Pulmonary    breath sounds clear to auscultation       Cardiovascular hypertension,  Rhythm:Regular Rate:Normal     Neuro/Psych     GI/Hepatic   Endo/Other    Renal/GU      Musculoskeletal   Abdominal  Peds  Hematology   Anesthesia Other Findings   Reproductive/Obstetrics                             Anesthesia Physical Anesthesia Plan  ASA: III  Anesthesia Plan: MAC and Spinal   Post-op Pain Management:    Induction:   PONV Risk Score and Plan: Ondansetron  Airway Management Planned: Natural Airway and Simple Face Mask  Additional Equipment:   Intra-op Plan:   Post-operative Plan:   Informed Consent: I have reviewed the patients History and Physical, chart, labs and discussed the procedure including the risks, benefits and alternatives for the proposed anesthesia with the patient or authorized representative who has indicated his/her understanding and acceptance.   Dental advisory given  Plan Discussed with: CRNA and Anesthesiologist  Anesthesia Plan Comments:         Anesthesia Quick Evaluation  Anesthesia Physical Anesthesia Plan  ASA: II  Anesthesia Plan: Spinal   Post-op Pain Management:    Induction:   PONV Risk Score and Plan: 3 and Ondansetron, Dexamethasone and Treatment may vary due to age or medical condition  Airway Management Planned: Natural Airway, Nasal Cannula and Simple Face Mask  Additional Equipment:   Intra-op Plan:   Post-operative Plan:   Informed Consent: I have reviewed the patients History and Physical, chart, labs and discussed the procedure including the risks, benefits and alternatives for the proposed anesthesia with the patient or authorized representative who has indicated his/her understanding and acceptance.     Plan Discussed with: CRNA and Surgeon  Anesthesia Plan Comments:         Anesthesia Quick Evaluation

## 2017-10-19 NOTE — Anesthesia Postprocedure Evaluation (Signed)
Anesthesia Post Note  Patient: Barbara Kelley  Procedure(s) Performed: ARTHROPLASTY BIPOLAR HIP (HEMIARTHROPLASTY) (Right Hip)     Patient location during evaluation: PACU Anesthesia Type: Spinal Level of consciousness: awake Pain management: pain level controlled Vital Signs Assessment: post-procedure vital signs reviewed and stable Respiratory status: spontaneous breathing Cardiovascular status: stable Postop Assessment: spinal receding Anesthetic complications: no    Last Vitals:  Vitals:   10/19/17 1948 10/19/17 1959  BP: (!) 91/50 100/78  Pulse: 82 90  Resp: 14 18  Temp:    SpO2: 97% 98%    Last Pain:  Vitals:   10/19/17 1930  TempSrc:   PainSc: 0-No pain                 Matraca Hunkins

## 2017-10-19 NOTE — Progress Notes (Signed)
Patient ID: Barbara Kelley, female   DOB: 12-28-1933, 84 y.o.   MRN: [id number]   LOS: 1 day   Subjective: No new c/o, ready for surgery.   Objective: Vital signs in last 24 hours: Temp:  [98 F (36.7 C)-99.6 F (37.6 C)] 98.8 F (37.1 C) (01/15 0827) Pulse Rate:  [84-109] 100 (01/15 0827) Resp:  [11-29] 18 (01/15 0827) BP: (139-178)/(79-97) 160/94 (01/15 0827) SpO2:  [91 %-97 %] 95 % (01/15 0827) Last BM Date: 10/17/17   Laboratory  CBC Recent Labs    10/18/17 0713 10/19/17 0710  WBC 18.6* 15.0*  HGB 14.3 12.6  HCT 44.4 39.2  PLT 184 178   BMET Recent Labs    10/18/17 0713 10/19/17 0710  NA 140 137  K 3.3* 3.5  CL 103 103  CO2 25 24  GLUCOSE 155* 142*  BUN 11 9  CREATININE 0.54 0.47  CALCIUM 9.4 9.1     Physical Exam General appearance: alert and no distress  RLE: 2+ DP, warm, motor intact   Assessment/Plan: Right hip fx -- For hemi this afternoon by Dr. Alvan Dame. Please keep NPO.    Lisette Abu, PA-C Orthopedic Surgery [phone] 10/19/2017

## 2017-10-19 NOTE — Anesthesia Procedure Notes (Signed)
Procedure Name: MAC Date/Time: 10/19/2017 6:10 PM Performed by: Imagene Riches, CRNA Pre-anesthesia Checklist: Patient identified, Emergency Drugs available, Patient being monitored and Suction available Patient Re-evaluated:Patient Re-evaluated prior to induction Oxygen Delivery Method: Simple face mask Preoxygenation: Pre-oxygenation with 100% oxygen

## 2017-10-19 NOTE — Op Note (Signed)
NAME:  Gabby Rackers, MD                ACCOUNT NO.:  [id number]   MEDICAL RECORD NO.: [id number]   LOCATION:  0263                         FACILITY:  [facility]   DATE OF BIRTH:  [birth year]  PHYSICIAN:  Pietro Cassis. Alvan Dame, M.D.     DATE OF PROCEDURE:  10/19/17                               OPERATIVE REPORT     PREOPERATIVE DIAGNOSIS:  right displaced femoral neck fracture.   POSTOPERATIVE DIAGNOSIS:  right displaced femoral neck fracture.   PROCEDURE:  right hip hemiarthroplasty utilizing DePuy component, size 4 standard Tri-Lock stem with a 43mm unipolar ball with a +0 adapter.   SURGEON:  Pietro Cassis. Alvan Dame, MD   ASSISTANT:  Danae Orleans, PA-C.   ANESTHESIA:  General.   SPECIMENS:  None.   DRAINS:  none.   BLOOD LOSS:  About 50-100 cc.   COMPLICATIONS:  None.   INDICATION OF PROCEDURE:  Ms Tullo is a very pleasant 84 year old female who lives independently.  She unfortunately had a fall at her residence after becoming dizzy she reported.  She was admitted to the hospital after radiographs revealed a femoral neck fracture.  She was seen and evaluated and was scheduled for surgery for fixation.  The necessity of surgical repair was discussed with she and her family.  Consent was obtained after reviewing risks of infection, DVT, component failure, and need for revision surgery.   PROCEDURE IN DETAIL:  The patient was brought to the operative theater. Once adequate anesthesia, preoperative antibiotics, 2 g of Ancef administered, the patient was positioned into the left lateral decubitus position with the right side up.  The right lower extremity was then prepped and draped in sterile fashion.  A time-out was performed identifying the patient, planned procedure, and extremity.   A lateral incision was made off the proximal trochanter. Sharp dissection was carried down to the iliotibial band and gluteal fascia. The gluteal fascia was then incised for posterior approach.  The  short external rotators were taken down separate from the posterior capsule. An L capsulotomy was made preserving the posterior leaflet for later anatomic repair. Fracture site was identified and after removing comminuted segments of the posterior femoral neck, the femoral head was removed without difficulty and measured on the back table  using the sizing rings and determined to be 44 mm in diameter.   The proximal femur was then exposed.  Retractors placed.  I then drilled, opened the proximal femur.  Then I hand reamed once and  Irrigated the canal to try to prevent fat emboli.  I began broaching the femur with a starter broach up to a size 4 broach with good medial and lateral metaphyseal fit without evidence of any torsion or movement.  A trial reduction was carried out with a standard offset neck and a +0 adapter with a 52mm ball.  The hip reduced nicely.  The leg lengths appeared to be equal compared to the down Leg (whichh had been previously replaced with hemiarthroplasty for femoral neck fracture in the past).   The hip went through a range of motion without evidence of any subluxation or impingement.   Given these findings, the trial  components removed.  The final 4 standard  Tri-Lock stem was opened.  After irrigating the canal, the final stem was impacted and sat at the level where the broach was. Based on this and the trial reduction, a +0 adapter was opened and impacted in the 14mm unipolar ball onto a clean and dry trunnion.  The hip had been irrigated throughout the case and again at this point.  I re- Approximated the posterior capsule to the superior leaflet using a  #1 Vicryl.  The remainder of the wound was closed with #1 Vicryl and #0 V-lock sutures in the iliotibial band and gluteal fascia, a  2-0 Vicryl in the sub-Q tissue and a running 4-0 Monocryl in the skin.  The hip was cleaned, dried, and dressed sterilely using Dermabond and Aquacel dressing.  She was then  brought to recovery room, extubated in stable condition, tolerating the procedure well.  Danae Orleans, PA-C was present and utilized as Environmental consultant for the entire case from  Preoperative positioning to management of the contralateral extremity and retractors to  General facilitation of the procedure.  He was also involved with primary wound closure.         Pietro Cassis Alvan Dame, M.D.

## 2017-10-19 NOTE — Progress Notes (Signed)
  Echocardiogram 2D Echocardiogram has been performed.  Barbara Kelley F 10/19/2017, 10:48 AM

## 2017-10-19 NOTE — Progress Notes (Signed)
PROGRESS NOTE    Barbara Kelley  CZY:[id number] DOB: Nov 08, 1933 DOA: 10/18/2017 PCP: [name], MD   Chief Complaint  Patient presents with  . Fall  . Hip Pain    Brief Narrative:  84 y.o. 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  Admitted for femoral neck fracture. Orthopedics consulted.  Assessment & Plan   Right closed subcapital fracture of the neck of the femur -Status post fall after becoming dizzy -X-ray showed a right subcapital femoral neck fracture with impaction -Orthopedic surgery consulted and appreciate, plan for surgical repair -Continue pain control -Given patient's age and medical history, she is at moderate risk  History of CVA  -Reports chronic left hand and neck neuropathy after stroke -not sure why patient is not on a statin  Near syncope/ dizziness -Patient reported becoming dizzy as she was going to the restroom, she fell without losing consciousness -Recently stopped Entresto secondary to increased urinary output. Patient was also on Lasix. Suspect patient had some orthostasis -Unable to check orthostatic vitals given her current femoral fracture -Echocardiogram ordered -Currently no neurological deficits on exam  Essential hypertension -Continue Coreg -Will add on hydralazine IV PRN  Nonischemic cardiomyopathy/chronic systolic heart failure -Patient follows with Dr. Wynonia Lawman, cardiology -Echocardiogram in 2018, EF of 25-30% (record not in Epic) -recently discontinued Entresto- she should follow up with Dr. Wynonia Lawman and discuss this  -As above, echocardiogram ordered -Patient appears euvolemic and compensated -Lasix currently held due to dizziness -Continue Coreg  Leukocytosis -Suspect reactive secondary to the above -WBC currently returning downward, continue to  monitor CBC -UA obtained- rare bacteria, 0-5 WBC, positive nitrites, trace leukocytes. Patient currently denies any urinary symptoms.  Hyperlipidemia -Continue omega-3  DVT Prophylaxis  SCDs  Code Status: Full  Family Communication: none at bedside  Disposition Plan: Admitted, pending surgery. Dispo TBD  Consultants Orthopedic surgery  Procedures  Echocardiogram  Antibiotics   Anti-infectives (From admission, onward)   Start     Dose/Rate Route Frequency Ordered Stop   10/19/17 1200  ceFAZolin (ANCEF) IVPB 2g/100 mL premix     2 g 200 mL/hr over 30 Minutes Intravenous On call to O.R. 10/19/17 0710 10/20/17 0559      Subjective:   Durenda Hurt seen and examined today.  Complains of mild right hip pain. States she is ready for surgery. Denies recurrent chest pain, shortness breath, abdominal pain, nausea vomiting, diarrhea or constipation, headache. Denies current dizziness however states she's not been able to get up since admission.  Objective:   Vitals:   10/18/17 1949 10/19/17 0445 10/19/17 0604 10/19/17 0827  BP: (!) 170/89 (!) 177/92 (!) 159/92 (!) 160/94  Pulse: (!) 106 (!) 109 (!) 102 100  Resp:    18  Temp: 99.6 F (37.6 C) 98.8 F (37.1 C)  98.8 F (37.1 C)  TempSrc: Oral Oral  Oral  SpO2: 95% 96%  95%  Weight:      Height:        Intake/Output Summary (Last 24 hours) at 10/19/2017 1212 Last data filed at 10/18/2017 1700 Gross per 24 hour  Intake 365 ml  Output -  Net 365 ml   Filed Weights   10/18/17 0715  Weight: 56.7 kg (125 lb)    Exam  General: Well developed, well nourished, NAD, appears stated age  HEENT: NCAT, mucous membranes moist.   Cardiovascular: S1  S2 auscultated, no rubs, murmurs or gallops. Regular rate and rhythm.  Respiratory: Clear to auscultation bilaterally with equal chest rise  Abdomen: Soft, nontender, nondistended, + bowel sounds  Extremities: warm dry without cyanosis clubbing or edema, RLE shorteneed  Neuro:  AAOx3, nonfocal  Psych: Normal affect and demeanor with intact judgement and insight   Data Reviewed: I have personally reviewed following labs and imaging studies  CBC: Recent Labs  Lab 10/18/17 0713 10/19/17 0710  WBC 18.6* 15.0*  NEUTROABS 16.2*  --   HGB 14.3 12.6  HCT 44.4 39.2  MCV 87.7 87.1  PLT 184 154   Basic Metabolic Panel: Recent Labs  Lab 10/18/17 0713 10/19/17 0710  NA 140 137  K 3.3* 3.5  CL 103 103  CO2 25 24  GLUCOSE 155* 142*  BUN 11 9  CREATININE 0.54 0.47  CALCIUM 9.4 9.1   GFR: Estimated Creatinine Clearance: 42.3 mL/min (by C-G formula based on SCr of 0.47 mg/dL). Liver Function Tests: Recent Labs  Lab 10/18/17 1330  ALBUMIN 3.9   No results for input(s): LIPASE, AMYLASE in the last 168 hours. No results for input(s): AMMONIA in the last 168 hours. Coagulation Profile: Recent Labs  Lab 10/18/17 0713  INR 0.99   Cardiac Enzymes: Recent Labs  Lab 10/18/17 1335  TROPONINI 0.04*   BNP (last 3 results) No results for input(s): PROBNP in the last 8760 hours. HbA1C: No results for input(s): HGBA1C in the last 72 hours. CBG: No results for input(s): GLUCAP in the last 168 hours. Lipid Profile: No results for input(s): CHOL, HDL, LDLCALC, TRIG, CHOLHDL, LDLDIRECT in the last 72 hours. Thyroid Function Tests: No results for input(s): TSH, T4TOTAL, FREET4, T3FREE, THYROIDAB in the last 72 hours. Anemia Panel: No results for input(s): VITAMINB12, FOLATE, FERRITIN, TIBC, IRON, RETICCTPCT in the last 72 hours. Urine analysis:    Component Value Date/Time   COLORURINE YELLOW 10/18/2017 0716   APPEARANCEUR HAZY (A) 10/18/2017 0716   LABSPEC 1.012 10/18/2017 0716   PHURINE 7.0 10/18/2017 0716   GLUCOSEU NEGATIVE 10/18/2017 0716   HGBUR NEGATIVE 10/18/2017 0716   BILIRUBINUR NEGATIVE 10/18/2017 0716   KETONESUR 5 (A) 10/18/2017 0716   PROTEINUR NEGATIVE 10/18/2017 0716   NITRITE POSITIVE (A) 10/18/2017 0716   LEUKOCYTESUR TRACE (A)  10/18/2017 0716   Sepsis Labs: @LABRCNTIP (procalcitonin:4,lacticidven:4)  ) Recent Results (from the past 240 hour(s))  Surgical pcr screen     Status: None   Collection Time: 10/18/17  6:50 PM  Result Value Ref Range Status   MRSA, PCR NEGATIVE NEGATIVE Final   Staphylococcus aureus NEGATIVE NEGATIVE Final    Comment: (NOTE) The Xpert SA Assay (FDA approved for NASAL specimens in patients 60 years of age and older), is one component of a comprehensive surveillance program. It is not intended to diagnose infection nor to guide or monitor treatment.       Radiology Studies: Ct Head Wo Contrast  Result Date: 10/18/2017 CLINICAL DATA:  84 year old female who awoke with dizziness and fell while walking to the bathroom. Posttraumatic headache. EXAM: CT HEAD WITHOUT CONTRAST CT CERVICAL SPINE WITHOUT CONTRAST TECHNIQUE: Multidetector CT imaging of the head and cervical spine was performed following the standard protocol without intravenous contrast. Multiplanar CT image reconstructions of the cervical spine were also generated. COMPARISON:  Chest CTA 09/18/2016 FINDINGS: CT HEAD FINDINGS Brain: Patchy and confluent bilateral cerebral white matter hypodensity associated with hypodensity in the left caudate nucleus and mild ex vacuo enlargement of the adjacent left frontal horn.  Mild deep gray matter hypodensity and heterogeneity elsewhere. Small chronic appearing lacunar infarct of the inferior right cerebellum. No cortical encephalomalacia identified. No midline shift, ventriculomegaly, mass effect, evidence of mass lesion, [name], normal variant.  No skull fracture identified. Sinuses/Orbits: Mild mucosal thickening in the ethmoids. Scattered mucosal thickening and/or small mucous retention cysts in both  maxillary sinuses. Otherwise clear. Other: Visualized orbit soft tissues are within normal limits. Suspected right right posterior convexity scalp soft tissue contusion (series 5, image 43). No other acute scalp soft tissue findings identified. CT CERVICAL SPINE FINDINGS Alignment: Mild straightening of cervical lordosis. Bilateral posterior element alignment is within normal limits. Cervicothoracic junction alignment is within normal limits. Skull base and vertebrae: Visualized skull base is intact. No atlanto-occipital dissociation. No cervical spine fracture identified. Soft tissues and spinal canal: No prevertebral fluid or swelling. No visible canal hematoma. Negative noncontrast neck soft tissues aside from calcified carotid artery atherosclerosis. Disc levels: Bulky osteophytosis along the anterior C1-odontoid articulation. Multilevel cervical facet hypertrophy greater on the right. Chronic disc space loss and endplate degeneration at C5-C6 and C6-C7. Up to mild lower cervical spinal stenosis. Upper chest: Visible upper thoracic levels appear intact. Pulmonary septal thickening in the lung apices. IMPRESSION: 1. Posterior right scalp contusion suspected. No underlying skull fracture. 2. Age indeterminate but mostly chronic appearing small vessel ischemia in the cerebral white matter, deep gray matter nuclei, and right cerebellum. 3. No acute fracture in the cervical spine. Cervical facet, disc, and endplate degeneration with up to mild degenerative lower cervical spinal stenosis. Electronically Signed   By: Genevie Ann M.D.   On: 10/18/2017 09:52   Ct Cervical Spine Wo Contrast  Result Date: 10/18/2017 CLINICAL DATA:  84 year old female who awoke with dizziness and fell while walking to the bathroom. Posttraumatic headache. EXAM: CT HEAD WITHOUT CONTRAST CT CERVICAL SPINE WITHOUT CONTRAST TECHNIQUE: Multidetector CT imaging of the head and cervical spine was performed following the standard protocol without  intravenous contrast. Multiplanar CT image reconstructions of the cervical spine were also generated. COMPARISON:  Chest CTA 09/18/2016 FINDINGS: CT HEAD FINDINGS Brain: Patchy and confluent bilateral cerebral white matter hypodensity associated with hypodensity in the left caudate nucleus and mild ex vacuo enlargement of the adjacent left frontal horn. Mild deep gray matter hypodensity and heterogeneity elsewhere. Small chronic appearing lacunar infarct of the inferior right cerebellum. No cortical encephalomalacia identified. No midline shift, ventriculomegaly, mass effect, evidence of mass lesion, [name], normal variant.  No skull fracture identified. Sinuses/Orbits: Mild mucosal thickening in the ethmoids. Scattered mucosal thickening and/or small mucous retention cysts in both maxillary sinuses. Otherwise clear. Other: Visualized orbit soft tissues are within normal limits. Suspected right right posterior convexity scalp soft tissue contusion (series 5, image 43). No other acute scalp soft tissue findings identified. CT CERVICAL SPINE FINDINGS Alignment: Mild straightening of cervical lordosis. Bilateral posterior element alignment is within normal limits. Cervicothoracic junction alignment is within normal limits. Skull base and vertebrae: Visualized skull base is intact. No atlanto-occipital dissociation. No cervical spine fracture identified. Soft tissues and spinal canal: No prevertebral fluid or swelling. No visible canal hematoma. Negative noncontrast neck soft tissues aside from calcified carotid artery atherosclerosis. Disc levels: Bulky osteophytosis along the anterior C1-odontoid articulation. Multilevel cervical facet  hypertrophy greater on the right. Chronic disc space loss and endplate degeneration at C5-C6  and C6-C7. Up to mild lower cervical spinal stenosis. Upper chest: Visible upper thoracic levels appear intact. Pulmonary septal thickening in the lung apices. IMPRESSION: 1. Posterior right scalp contusion suspected. No underlying skull fracture. 2. Age indeterminate but mostly chronic appearing small vessel ischemia in the cerebral white matter, deep gray matter nuclei, and right cerebellum. 3. No acute fracture in the cervical spine. Cervical facet, disc, and endplate degeneration with up to mild degenerative lower cervical spinal stenosis. Electronically Signed   By: Genevie Ann M.D.   On: 10/18/2017 09:52   Dg Hip Unilat With Pelvis 2-3 Views Right  Result Date: 10/18/2017 CLINICAL DATA:  Pain following fall EXAM: DG HIP (WITH OR WITHOUT PELVIS) 2-3V RIGHT COMPARISON:  None. FINDINGS: Frontal pelvis as well as frontal and lateral right hip images were obtained. There is a subcapital femoral neck fracture on the right with mild impaction at the fracture site. No other fracture evident. No dislocation. There is a total hip replacement on the left with visualized prosthetic components on the left appearing well-seated. There is moderate narrowing of the right hip joint. Bones are diffusely osteoporotic. IMPRESSION: Subcapital femoral neck fracture on the right with impaction at the fracture site. No other acute fracture. No dislocation. Narrowing right hip joint. Bones osteoporotic. Total hip replacement on the left with prosthetic components appearing well-seated in this area. Electronically Signed   By: Lowella Grip III M.D.   On: 10/18/2017 08:21     Scheduled Meds: . calcium-vitamin D  1 tablet Oral Daily  . carvedilol  3.125 mg Oral BID WC  . multivitamin with minerals   Oral Daily  . omega-3 acid ethyl esters  1 g Oral BID  . povidone-iodine  2 application Topical Once   Continuous Infusions: . sodium chloride 50 mL/hr at 10/19/17 0900  .  ceFAZolin (ANCEF) IV    . methocarbamol (ROBAXIN)   IV 500 mg (10/19/17 1109)     LOS: 1 day   Time Spent in minutes   30 minutes  Coumba Kellison D.O. on 10/19/2017 at 12:12 PM  Between 7am to 7pm - Pager - [phone]  After 7pm go to [email or web address] - password [email or web address]  And look for the night coverage person covering for me after hours  [facility] Office  [phone]

## 2017-10-19 NOTE — Progress Notes (Signed)
Patient ID: Barbara Kelley, female   DOB: [birth year], 84 y.o.   MRN: [id number]  Pleasant 84 yo female with right femoral neck fracture, displaced History of left hip bipolar hemiarthroplasty History of left total knee  "mild" heart failure treated, stable  Reviewed right hip fracture and plan  To OR today for right hip hemiarthroplasty Will be WBAT post op NPO now  Consent ordered

## 2017-10-19 NOTE — Transfer of Care (Signed)
Immediate Anesthesia Transfer of Care Note  Patient: Barbara Kelley  Procedure(s) Performed: ARTHROPLASTY BIPOLAR HIP (HEMIARTHROPLASTY) (Right Hip)  Patient Location: PACU  Anesthesia Type:Spinal  Level of Consciousness: awake, alert  and oriented  Airway & Oxygen Therapy: Patient Spontanous Breathing  Post-op Assessment: Report given to RN and Post -op Vital signs reviewed and stable  Post vital signs: Reviewed and stable  Last Vitals:  Vitals:   10/19/17 1552 10/19/17 1930  BP: (!) 171/101 118/82  Pulse: (!) 104   Resp:    Temp: 37.4 C 36.9 C  SpO2: 95% 97%    Last Pain:  Vitals:   10/19/17 1930  TempSrc:   PainSc: 0-No pain         Complications: No apparent anesthesia complications

## 2017-10-19 NOTE — Anesthesia Procedure Notes (Addendum)
Spinal  Patient location during procedure: OR Start time: 10/19/2017 6:01 PM End time: 10/19/2017 6:05 PM Staffing Anesthesiologist: Lyn Hollingshead, MD Performed: anesthesiologist  Preanesthetic Checklist Completed: patient identified, surgical consent, pre-op evaluation, timeout performed, IV checked, risks and benefits discussed and monitors and equipment checked Spinal Block Patient position: sitting Prep: site prepped and draped and DuraPrep Patient monitoring: continuous pulse ox and blood pressure Approach: midline Location: L3-4 Injection technique: single-shot Needle Needle type: Pencan  Needle gauge: 24 G Assessment Sensory level: T6

## 2017-10-20 ENCOUNTER — Encounter (HOSPITAL_COMMUNITY): Payer: Self-pay | Admitting: General Practice

## 2017-10-20 ENCOUNTER — Other Ambulatory Visit: Payer: Self-pay

## 2017-10-20 HISTORY — PX: HEMIARTHROPLASTY HIP: SUR652

## 2017-10-20 LAB — CBC
HCT: 37.9 % (ref 36.0–46.0)
Hemoglobin: 12.2 g/dL (ref 12.0–15.0)
MCH: 28.1 pg (ref 26.0–34.0)
MCHC: 32.2 g/dL (ref 30.0–36.0)
MCV: 87.3 fL (ref 78.0–100.0)
PLATELETS: 175 10*3/uL (ref 150–400)
RBC: 4.34 MIL/uL (ref 3.87–5.11)
RDW: 13.2 % (ref 11.5–15.5)
WBC: 15.3 10*3/uL — AB (ref 4.0–10.5)

## 2017-10-20 LAB — BASIC METABOLIC PANEL
ANION GAP: 12 (ref 5–15)
BUN: 13 mg/dL (ref 6–20)
CO2: 22 mmol/L (ref 22–32)
Calcium: 9.3 mg/dL (ref 8.9–10.3)
Chloride: 103 mmol/L (ref 101–111)
Creatinine, Ser: 0.6 mg/dL (ref 0.44–1.00)
GFR calc Af Amer: >60 mL/min (ref >60)
Glucose, Bld: 209 mg/dL — ABNORMAL HIGH (ref 65–99)
POTASSIUM: 3.6 mmol/L (ref 3.5–5.1)
Sodium: 137 mmol/L (ref 135–145)

## 2017-10-20 MED ORDER — POLYETHYLENE GLYCOL 3350 17 G PO PACK: 17.0000 g | PACK | Freq: Two times a day (BID) | ORAL | 0 refills | Status: DC

## 2017-10-20 MED ORDER — ACETAMINOPHEN 500 MG PO TABS: 1000.0000 mg | ORAL_TABLET | Freq: Three times a day (TID) | ORAL | 0 refills | Status: DC

## 2017-10-20 MED ORDER — TRAMADOL HCL 50 MG PO TABS: 50.0000 mg | ORAL_TABLET | Freq: Four times a day (QID) | ORAL | 0 refills | Status: DC | PRN

## 2017-10-20 MED ORDER — FERROUS SULFATE 325 (65 FE) MG PO TABS: 325.0000 mg | ORAL_TABLET | Freq: Three times a day (TID) | ORAL | 3 refills | Status: DC

## 2017-10-20 MED ORDER — ASPIRIN 81 MG PO CHEW: 81.0000 mg | CHEWABLE_TABLET | Freq: Two times a day (BID) | ORAL | 0 refills | Status: AC

## 2017-10-20 MED ORDER — METHOCARBAMOL 500 MG PO TABS: 500.0000 mg | ORAL_TABLET | Freq: Four times a day (QID) | ORAL | 0 refills | Status: DC | PRN

## 2017-10-20 MED ORDER — DOCUSATE SODIUM 100 MG PO CAPS: 100.0000 mg | ORAL_CAPSULE | Freq: Two times a day (BID) | ORAL | 0 refills | Status: DC

## 2017-10-20 MED ORDER — ASPIRIN EC 325 MG PO TBEC
325.0000 mg | DELAYED_RELEASE_TABLET | Freq: Two times a day (BID) | ORAL | Status: DC
  Administered 2017-10-20 – 2017-10-21 (×3): 325 mg via ORAL
  Filled 2017-10-20 (×3): qty 1

## 2017-10-20 NOTE — NC FL2 (Signed)
[place] LEVEL OF CARE SCREENING TOOL     IDENTIFICATION  Patient Name: Barbara Kelley Birthdate: Jun 07, 1934 Sex: female Admission Date (Current Location): 10/18/2017  [facility] and [place] Number:  Herbalist and Address:  [facility], [place] [address], [place], [place] [address]      Provider Number: [id number]  Attending Physician Name and Address:  Cristal Ford, DO  Relative Name and Phone Number:  Nat Math, daughter, [phone]    Current Level of Care: Hospital Recommended Level of Care: [place] Prior Approval Number:    Date Approved/Denied:   PASRR Number: [id number]  Discharge Plan: SNF    Current Diagnoses: Patient Active Problem List   Diagnosis Date Noted  . Closed subcapital fracture of neck of femur, right, initial encounter ([place]) 10/18/2017  . Hypertension 10/18/2017  . NICM (nonischemic cardiomyopathy) ([place]) 10/18/2017  . Chronic systolic heart failure ([place]) 10/18/2017  . HLD (hyperlipidemia) 10/18/2017  . Near syncope 10/18/2017  . S/P left TKA 05/04/2017  . S/P total knee replacement 05/04/2017  . History of CVA (cerebrovascular accident) 10/05/2006    Orientation RESPIRATION BLADDER Height & Weight     Self, Time, Situation, Place  Normal Continent Weight: 125 lb 0 oz (56.7 kg) Height:  4' 11.02" (149.9 cm)  BEHAVIORAL SYMPTOMS/MOOD NEUROLOGICAL BOWEL NUTRITION STATUS      Continent Diet(See DC Summary)  AMBULATORY STATUS COMMUNICATION OF NEEDS Skin   Limited Assist Verbally Surgical wounds                       Personal Care Assistance Level of Assistance  Dressing, Bathing, Feeding Bathing Assistance: Maximum assistance Feeding assistance: Independent Dressing Assistance: Maximum assistance     Functional Limitations Info  Sight, Hearing, Speech Sight Info: Adequate Hearing Info: Adequate Speech Info: Adequate    SPECIAL CARE FACTORS FREQUENCY  OT (By  licensed OT), PT (By licensed PT)     PT Frequency: 5x week OT Frequency: 5x week            Contractures      Additional Factors Info  Code Status, Allergies Code Status Info: Full Allergies Info: DOXYCYCLINE, MACROBID NITROFURANTOIN, VALSARTAN, LISINOPRIL, CALCIUM CHANNEL BLOCKERS, PENICILLINS            Current Medications (10/20/2017):  This is the current hospital active medication list Current Facility-Administered Medications  Medication Dose Route Frequency Provider Last Rate Last Dose  . 0.9 %  sodium chloride infusion   Intravenous Continuous Paralee Cancel, MD 75 mL/hr at 10/19/17 2054    . acetaminophen (TYLENOL) tablet 650 mg  650 mg Oral Q6H PRN Paralee Cancel, MD       Or  . acetaminophen (TYLENOL) suppository 650 mg  650 mg Rectal Q6H PRN Paralee Cancel, MD      . acetaminophen (TYLENOL) tablet 1,000 mg  1,000 mg Oral Q6H Paralee Cancel, MD   1,000 mg at 10/20/17 0350  . alum & mag hydroxide-simeth (MAALOX/MYLANTA) 200-200-20 MG/5ML suspension 30 mL  30 mL Oral Q4H PRN Paralee Cancel, MD      . aspirin EC tablet 325 mg  325 mg Oral BID Danae Orleans, PA-C   325 mg at 10/20/17 0938  . calcium-vitamin D (OSCAL WITH D) 500-200 MG-UNIT per tablet 1 tablet  1 tablet Oral Daily Samella Parr, NP   1 tablet at 10/20/17 [phone]  . carvedilol (COREG) tablet 3.125 mg  3.125 mg Oral BID WC  Karmen Bongo, MD   3.125 mg at 10/20/17 1610  . docusate sodium (COLACE) capsule 100 mg  100 mg Oral BID Paralee Cancel, MD   100 mg at 10/20/17 9604  . fentaNYL (SUBLIMAZE) injection 12.5 mcg  12.5 mcg Intravenous Q2H PRN Samella Parr, NP   12.5 mcg at 10/19/17 1129  . hydrALAZINE (APRESOLINE) injection 10 mg  10 mg Intravenous Q6H PRN Cristal Ford, DO   10 mg at 10/20/17 0417  . lactated ringers infusion   Intravenous Continuous Barnet Glasgow, MD 10 mL/hr at 10/19/17 1626    . menthol-cetylpyridinium (CEPACOL) lozenge 3 mg  1 lozenge Oral PRN Paralee Cancel, MD       Or  .  phenol (CHLORASEPTIC) mouth spray 1 spray  1 spray Mouth/Throat PRN Paralee Cancel, MD      . methocarbamol (ROBAXIN) 500 mg in dextrose 5 % 50 mL IVPB  500 mg Intravenous Q6H PRN Samella Parr, NP 110 mL/hr at 10/19/17 1109 500 mg at 10/19/17 1109  . methocarbamol (ROBAXIN) tablet 500 mg  500 mg Oral Q6H PRN Paralee Cancel, MD       Or  . methocarbamol (ROBAXIN) 500 mg in dextrose 5 % 50 mL IVPB  500 mg Intravenous Q6H PRN Paralee Cancel, MD      . metoCLOPramide (REGLAN) tablet 5-10 mg  5-10 mg Oral Q8H PRN Paralee Cancel, MD       Or  . metoCLOPramide (REGLAN) injection 5-10 mg  5-10 mg Intravenous Q8H PRN Paralee Cancel, MD      . multivitamin with minerals tablet   Oral Daily Samella Parr, NP   1 tablet at 10/20/17 5409  . omega-3 acid ethyl esters (LOVAZA) capsule 1 g  1 g Oral BID Samella Parr, NP   1 g at 10/20/17 8119  . ondansetron (ZOFRAN) tablet 4 mg  4 mg Oral Q6H PRN Paralee Cancel, MD       Or  . ondansetron [facility]) injection 4 mg  4 mg Intravenous Q6H PRN Paralee Cancel, MD      . polyethylene glycol (MIRALAX / GLYCOLAX) packet 17 g  17 g Oral Daily PRN Paralee Cancel, MD      . traMADol Veatrice Bourbon) tablet 50-100 mg  50-100 mg Oral Q6H PRN Paralee Cancel, MD         Discharge Medications: Please see discharge summary for a list of discharge medications.  Relevant Imaging Results:  Relevant Lab Results:   Additional Information SS#: [place]in, LCSW

## 2017-10-20 NOTE — Progress Notes (Signed)
     Subjective: 1 Day Post-Op Procedure(s) (LRB): ARTHROPLASTY BIPOLAR HIP (HEMIARTHROPLASTY) (Right)   Patient reports pain as mild, pain controlled.  No events throughout the night. Feels that the hip is doing well.  Looking forward to working with PT.  Objective:   VITALS:   Vitals:   10/20/17 0408 10/20/17 0616  BP: (!) 179/109 134/65  Pulse: 100   Resp:    Temp: 98.4 F (36.9 C)   SpO2: 99%     Dorsiflexion/Plantar flexion intact Incision: dressing C/D/I No cellulitis present Compartment soft  LABS Recent Labs    10/18/17 0713 10/19/17 0710 10/20/17 0504  HGB 14.3 12.6 12.2  HCT 44.4 39.2 37.9  WBC 18.6* 15.0* 15.3*  PLT 184 178 175    Recent Labs    10/18/17 0713 10/19/17 0710 10/20/17 0504  NA 140 137 137  K 3.3* 3.5 3.6  BUN 11 9 13   CREATININE 0.54 0.47 0.60  GLUCOSE 155* 142* 209*     Assessment/Plan: 1 Day Post-Op Procedure(s) (LRB): ARTHROPLASTY BIPOLAR HIP (HEMIARTHROPLASTY) (Right) Advance diet Up with therapy D/C IV fluids Discharge home if safe to do so and when ready   Ortho recommendations:  ASA 81 mg bid for 4 weeks for anticoagulation, unless other medically indicated.  Tramadol as needed for pain management (Rx written).  Robaxin as needed for muscle spasms (Rx written).  MiraLax and Colace for constipation  Iron 325 mg tid for 2-3 weeks   WBAT on the right leg.  Dressing to remain in place until follow in clinic in 2 weeks.  Dressing is waterproof and may shower with it in place.  Follow up in 2 weeks at [facility]. Follow up with OLIN,Sylvio Weatherall D in 2 weeks.  Contact information:  [facility] [address] [place] Maydell Knoebel   PAC  10/20/2017, 7:30 AM

## 2017-10-20 NOTE — Progress Notes (Signed)
PROGRESS NOTE    Barbara Kelley  LZJ:[id number] DOB: [birth year] DOA: 10/18/2017 PCP: [name], MD   Chief Complaint  Patient presents with  . Fall  . Hip Pain    Brief Narrative:  84 y.o. 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  Admitted for femoral neck fracture. Orthopedics consulted, s/p surgery.Pending PT/OT  Assessment & Plan   Right closed subcapital fracture of the neck of the femur -Status post fall after becoming dizzy -X-ray showed a right subcapital femoral neck fracture with impaction -Given patient's age and medical history, she is at moderate risk -Orthopedic surgery consulted and appreciated, s/p right hip hemiarthroplasty utilizing DePuy component, size 4 standard Tri-Lock stem with a 15mm unipolar ball with a +0 adapter -Continue pain control -PT and OT consulted, suspect patient will need SNF -Social work consulted  History of CVA  -Reports chronic left hand and neck neuropathy after stroke -not sure why patient is not on a statin  Near syncope/ dizziness -Patient reported becoming dizzy as she was going to the restroom, she fell without losing consciousness -Recently stopped Entresto secondary to increased urinary output. Patient was also on Lasix. Suspect patient had some orthostasis -Unable to check orthostatic vitals given her current femoral fracture -Echocardiogram ordered and pending  -Currently no neurological deficits on exam  Essential hypertension -Stable, Continue Coreg and IV hydralazine PRN  Nonischemic cardiomyopathy/chronic systolic heart failure -Patient follows with Dr. Wynonia Lawman, cardiology -Echocardiogram in 2018, EF of 25-30% (record not in Epic) -recently discontinued Entresto- she should follow up with Dr. Wynonia Lawman and discuss this  -As above,  echocardiogram ordered -Patient appears euvolemic and compensated -Lasix currently held due to dizziness -Continue Coreg  Leukocytosis -Suspect reactive secondary to the above -WBC currently returning downward, continue to monitor CBC -UA obtained- rare bacteria, 0-5 WBC, positive nitrites, trace leukocytes. Patient currently denies any urinary symptoms. -Urine culture pending   Hyperlipidemia -Continue omega-3  DVT Prophylaxis  SCDs  Code Status: Full  Family Communication: none at bedside  Disposition Plan: Admitted, Pending PT/OT evaluations. Suspect SNF  Consultants Orthopedic surgery  Procedures  Echocardiogram  right hip hemiarthroplasty utilizing DePuy component, size 4 standard Tri-Lock stem with a 43mm unipolar ball with a +0 adapter  Antibiotics   Anti-infectives (From admission, onward)   Start     Dose/Rate Route Frequency Ordered Stop   10/19/17 2330  ceFAZolin (ANCEF) IVPB 1 g/50 mL premix     1 g 100 mL/hr over 30 Minutes Intravenous Every 6 hours 10/19/17 2106 10/20/17 0543   10/19/17 1200  ceFAZolin (ANCEF) IVPB 2g/100 mL premix     2 g 200 mL/hr over 30 Minutes Intravenous On call to O.R. 10/19/17 0710 10/19/17 1821      Subjective:   Barbara Kelley seen and examined today.  Has no complaints today. Denies chest pain, shortness breath, abdominal pain, nausea vomiting, diarrhea or constipation, headache.   Objective:   Vitals:   10/19/17 2042 10/20/17 0042 10/20/17 0408 10/20/17 0616  BP: 137/75 (!) 143/89 (!) 179/109 134/65  Pulse: 95 88 100   Resp: 18     Temp: 99.3 F (37.4 C) 99.1 F (37.3 C) 98.4 F (36.9 C)   TempSrc: Oral Oral Oral   SpO2: 93% 97% 99%   Weight:      Height:        Intake/Output Summary (Last  24 hours) at 10/20/2017 0823 Last data filed at 10/20/2017 0300 Gross per 24 hour  Intake 1140 ml  Output 1150 ml  Net -10 ml   Filed Weights   10/18/17 0715 10/19/17 1618  Weight: 56.7 kg (125 lb) 56.7 kg (125 lb 0 oz)      Exam  General: Well developed, well nourished, NAD, appears stated age  HEENT: NCAT, mucous membranes moist.   Cardiovascular: S1 S2 auscultated, RRR  Respiratory: Clear to auscultation bilaterally with equal chest rise, no wheezing  Abdomen: Soft, nontender, nondistended, + bowel sounds  Extremities: warm dry without cyanosis clubbing. Dressing clean/intact on right hip, mild edema.  Neuro: AAOx3, nonfocal  Psych:Appropriate mood and affect, pleasant   Data Reviewed: I have personally reviewed following labs and imaging studies  CBC: Recent Labs  Lab 10/18/17 0713 10/19/17 0710 10/20/17 0504  WBC 18.6* 15.0* 15.3*  NEUTROABS 16.2*  --   --   HGB 14.3 12.6 12.2  HCT 44.4 39.2 37.9  MCV 87.7 87.1 87.3  PLT 184 178 503   Basic Metabolic Panel: Recent Labs  Lab 10/18/17 0713 10/19/17 0710 10/20/17 0504  NA 140 137 137  K 3.3* 3.5 3.6  CL 103 103 103  CO2 25 24 22   GLUCOSE 155* 142* 209*  BUN 11 9 13   CREATININE 0.54 0.47 0.60  CALCIUM 9.4 9.1 9.3   GFR: Estimated Creatinine Clearance: 42.3 mL/min (by C-G formula based on SCr of 0.6 mg/dL). Liver Function Tests: Recent Labs  Lab 10/18/17 1330  ALBUMIN 3.9   No results for input(s): LIPASE, AMYLASE in the last 168 hours. No results for input(s): AMMONIA in the last 168 hours. Coagulation Profile: Recent Labs  Lab 10/18/17 0713  INR 0.99   Cardiac Enzymes: Recent Labs  Lab 10/18/17 1335  TROPONINI 0.04*   BNP (last 3 results) No results for input(s): PROBNP in the last 8760 hours. HbA1C: No results for input(s): HGBA1C in the last 72 hours. CBG: No results for input(s): GLUCAP in the last 168 hours. Lipid Profile: No results for input(s): CHOL, HDL, LDLCALC, TRIG, CHOLHDL, LDLDIRECT in the last 72 hours. Thyroid Function Tests: No results for input(s): TSH, T4TOTAL, FREET4, T3FREE, THYROIDAB in the last 72 hours. Anemia Panel: No results for input(s): VITAMINB12, FOLATE, FERRITIN,  TIBC, IRON, RETICCTPCT in the last 72 hours. Urine analysis:    Component Value Date/Time   COLORURINE YELLOW 10/18/2017 0716   APPEARANCEUR HAZY (A) 10/18/2017 0716   LABSPEC 1.012 10/18/2017 0716   PHURINE 7.0 10/18/2017 0716   GLUCOSEU NEGATIVE 10/18/2017 0716   HGBUR NEGATIVE 10/18/2017 0716   BILIRUBINUR NEGATIVE 10/18/2017 0716   KETONESUR 5 (A) 10/18/2017 0716   PROTEINUR NEGATIVE 10/18/2017 0716   NITRITE POSITIVE (A) 10/18/2017 0716   LEUKOCYTESUR TRACE (A) 10/18/2017 0716   Sepsis Labs: @LABRCNTIP (procalcitonin:4,lacticidven:4)  ) Recent Results (from the past 240 hour(s))  Surgical pcr screen     Status: None   Collection Time: 10/18/17  6:50 PM  Result Value Ref Range Status   MRSA, PCR NEGATIVE NEGATIVE Final   Staphylococcus aureus NEGATIVE NEGATIVE Final    Comment: (NOTE) The Xpert SA Assay (FDA approved for NASAL specimens in patients 16 years of age and older), is one component of a comprehensive surveillance program. It is not intended to diagnose infection nor to guide or monitor treatment.       Radiology Studies: Ct Head Wo Contrast  Result Date: 10/18/2017 CLINICAL DATA:  84 year old female who awoke with dizziness  and fell while walking to the bathroom. Posttraumatic headache. EXAM: CT HEAD WITHOUT CONTRAST CT CERVICAL SPINE WITHOUT CONTRAST TECHNIQUE: Multidetector CT imaging of the head and cervical spine was performed following the standard protocol without intravenous contrast. Multiplanar CT image reconstructions of the cervical spine were also generated. COMPARISON:  Chest CTA 09/18/2016 FINDINGS: CT HEAD FINDINGS Brain: Patchy and confluent bilateral cerebral white matter hypodensity associated with hypodensity in the left caudate nucleus and mild ex vacuo enlargement of the adjacent left frontal horn. Mild deep gray matter hypodensity and heterogeneity elsewhere. Small chronic appearing lacunar infarct of the inferior right cerebellum. No cortical  encephalomalacia identified. No midline shift, ventriculomegaly, mass effect, evidence of mass lesion, [name], normal variant.  No skull fracture identified. Sinuses/Orbits: Mild mucosal thickening in the ethmoids. Scattered mucosal thickening and/or small mucous retention cysts in both maxillary sinuses. Otherwise clear. Other: Visualized orbit soft tissues are within normal limits. Suspected right right posterior convexity scalp soft tissue contusion (series 5, image 43). No other acute scalp soft tissue findings identified. CT CERVICAL SPINE FINDINGS Alignment: Mild straightening of cervical lordosis. Bilateral posterior element alignment is within normal limits. Cervicothoracic junction alignment is within normal limits. Skull base and vertebrae: Visualized skull base is intact. No atlanto-occipital dissociation. No cervical spine fracture identified. Soft tissues and spinal canal: No prevertebral fluid or swelling. No visible canal hematoma. Negative noncontrast neck soft tissues aside from calcified carotid artery atherosclerosis. Disc levels: Bulky osteophytosis along the anterior C1-odontoid articulation. Multilevel cervical facet hypertrophy greater on the right. Chronic disc space loss and endplate degeneration at C5-C6 and C6-C7. Up to mild lower cervical spinal stenosis. Upper chest: Visible upper thoracic levels appear intact. Pulmonary septal thickening in the lung apices. IMPRESSION: 1. Posterior right scalp contusion suspected. No underlying skull fracture. 2. Age indeterminate but mostly chronic appearing small vessel ischemia in the cerebral white matter, deep gray matter nuclei, and right cerebellum. 3. No acute fracture in the cervical spine. Cervical facet, disc, and endplate degeneration with up to mild  degenerative lower cervical spinal stenosis. Electronically Signed   By: Genevie Ann M.D.   On: 10/18/2017 09:52   Ct Cervical Spine Wo Contrast  Result Date: 10/18/2017 CLINICAL DATA:  84 year old female who awoke with dizziness and fell while walking to the bathroom. Posttraumatic headache. EXAM: CT HEAD WITHOUT CONTRAST CT CERVICAL SPINE WITHOUT CONTRAST TECHNIQUE: Multidetector CT imaging of the head and cervical spine was performed following the standard protocol without intravenous contrast. Multiplanar CT image reconstructions of the cervical spine were also generated. COMPARISON:  Chest CTA 09/18/2016 FINDINGS: CT HEAD FINDINGS Brain: Patchy and confluent bilateral cerebral white matter hypodensity associated with hypodensity in the left caudate nucleus and mild ex vacuo enlargement of the adjacent left frontal horn. Mild deep gray matter hypodensity and heterogeneity elsewhere. Small chronic appearing lacunar infarct of the inferior right cerebellum. No cortical encephalomalacia identified. No midline shift, ventriculomegaly, mass effect, evidence of mass lesion, [name], normal variant.  No skull fracture identified. Sinuses/Orbits: Mild mucosal thickening in the ethmoids. Scattered mucosal thickening and/or small mucous retention cysts in both maxillary sinuses. Otherwise clear. Other: Visualized orbit soft tissues are within normal limits. Suspected right right posterior convexity scalp soft tissue contusion (series 5, image 43). No  other acute scalp soft tissue findings identified. CT CERVICAL SPINE FINDINGS Alignment: Mild straightening of cervical lordosis. Bilateral posterior element alignment is within normal limits. Cervicothoracic junction alignment is within normal limits. Skull base and vertebrae: Visualized  skull base is intact. No atlanto-occipital dissociation. No cervical spine fracture identified. Soft tissues and spinal canal: No prevertebral fluid or swelling. No visible canal hematoma. Negative noncontrast neck soft tissues aside from calcified carotid artery atherosclerosis. Disc levels: Bulky osteophytosis along the anterior C1-odontoid articulation. Multilevel cervical facet hypertrophy greater on the right. Chronic disc space loss and endplate degeneration at C5-C6 and C6-C7. Up to mild lower cervical spinal stenosis. Upper chest: Visible upper thoracic levels appear intact. Pulmonary septal thickening in the lung apices. IMPRESSION: 1. Posterior right scalp contusion suspected. No underlying skull fracture. 2. Age indeterminate but mostly chronic appearing small vessel ischemia in the cerebral white matter, deep gray matter nuclei, and right cerebellum. 3. No acute fracture in the cervical spine. Cervical facet, disc, and endplate degeneration with up to mild degenerative lower cervical spinal stenosis. Electronically Signed   By: Genevie Ann M.D.   On: 10/18/2017 09:52   Pelvis Portable  Result Date: 10/19/2017 CLINICAL DATA:  Status post right hip replacement EXAM: PORTABLE PELVIS 1-2 VIEWS COMPARISON:  CT abdomen pelvis 09/18/2016, radiograph 10/18/2017 FINDINGS: Prior left hip replacement. Interval right hip replacement with normal alignment. Gas in the soft tissues consistent with recent operative status. Pubic symphysis and rami are intact IMPRESSION: Status post right hip replacement with expected postsurgical changes Electronically Signed   By: Donavan Foil M.D.   On: 10/19/2017 21:52     Scheduled Meds: . acetaminophen  1,000 mg Oral Q6H  . aspirin EC  325 mg Oral BID  . calcium-vitamin D  1 tablet Oral Daily  . carvedilol  3.125 mg Oral BID WC  . docusate sodium  100 mg Oral BID  . multivitamin with minerals   Oral Daily  . omega-3 acid ethyl esters  1 g Oral BID   Continuous  Infusions: . sodium chloride 75 mL/hr at 10/19/17 2054  . lactated ringers 10 mL/hr at 10/19/17 1626  . methocarbamol (ROBAXIN)  IV 500 mg (10/19/17 1109)  . methocarbamol (ROBAXIN)  IV       LOS: 2 days   Time Spent in minutes   30 minutes  Akosua Constantine D.O. on 10/20/2017 at 8:23 AM  Between 7am to 7pm - Pager - [phone]  After 7pm go to [email or web address] - password [email or web address]  And look for the night coverage person covering for me after hours  [facility] Office  [phone]

## 2017-10-20 NOTE — Evaluation (Signed)
Occupational Therapy Evaluation Patient Details Name: Barbara Kelley MRN: [id number] DOB: 05-Sep-1934 Today's Date: 10/20/2017    History of Present Illness Barbara Kelley got up from bed to go to the bathroom, got dizzy, and fell. She had immediate right hip; R hip fx, now s/p R hip hemiarthroplasty, posterior approach, WBAT; Vertigo-like symptoms preceded fall   Clinical Impression   Patient is s/p R THA posterior precaution surgery resulting in functional limitations due to the deficits listed below (see OT problem list). Pt demonstrates min guard (A) for UB and max (A) for LB due to hip precautions.  Patient will benefit from skilled OT acutely to increase independence and safety with ADLS to allow discharge SNF.     Follow Up Recommendations  SNF    Equipment Recommendations  3 in 1 bedside commode    Recommendations for Other Services       Precautions / Restrictions Precautions Precautions: Posterior Hip Precaution Comments: handout provided and reviewed for adls Restrictions Weight Bearing Restrictions: Yes RLE Weight Bearing: Weight bearing as tolerated      Mobility Bed Mobility Overal bed mobility: Needs Assistance Bed Mobility: Rolling;Supine to Sit Rolling: Min assist   Supine to sit: +2 for physical assistance;Mod assist     General bed mobility comments: pt needs cues for hip precautions and safety . Pt needed pillow between knees for abduction  Transfers Overall transfer level: Needs assistance Equipment used: Rolling walker (2 wheeled) Transfers: Sit to/from Stand Sit to Stand: Min assist         General transfer comment: pt with cues for hand placement with mod cues    Balance Overall balance assessment: Needs assistance Sitting-balance support: Bilateral upper extremity supported;Feet supported Sitting balance-Leahy Scale: Good     Standing balance support: Bilateral upper extremity supported;During functional activity Standing balance-Leahy  Scale: Fair                             ADL either performed or assessed with clinical judgement   ADL Overall ADL's : Needs assistance/impaired Eating/Feeding: Independent   Grooming: Independent Grooming Details (indicate cue type and reason): able to sit and complete Upper Body Bathing: Min guard;Sitting   Lower Body Bathing: Maximal assistance;Sit to/from stand Lower Body Bathing Details (indicate cue type and reason): pt able to pull up underwear and place but unabel to reach due to posterior hip precautions. pt will need AE education to maximize independence Upper Body Dressing : Supervision/safety   Lower Body Dressing: Maximal assistance;Sit to/from stand Lower Body Dressing Details (indicate cue type and reason): pt requires (A) to thread underwear over feet. pt is incontinent of bowel during session and incontinence of bladder on arrival Toilet Transfer: Minimal assistance Toilet Transfer Details (indicate cue type and reason): pt provided plus 2 (A) for initial transfer due to orthostatic BP                  Vision Baseline Vision/History: Wears glasses Wears Glasses: At all times Vision Assessment?: No apparent visual deficits     Perception     Praxis      Pertinent Vitals/Pain Pain Assessment: 0-10 Pain Score: 1  Pain Location: R LEG Pain Descriptors / Indicators: Sore Pain Intervention(s): Premedicated before session;Repositioned;Monitored during session;Ice applied     Hand Dominance Right   Extremity/Trunk Assessment Upper Extremity Assessment Upper Extremity Assessment: Generalized weakness   Lower Extremity Assessment Lower Extremity Assessment: Defer to PT evaluation  Cervical / Trunk Assessment Cervical / Trunk Assessment: Normal   Communication Communication Communication: No difficulties   Cognition Arousal/Alertness: Awake/alert Behavior During Therapy: WFL for tasks assessed/performed Overall Cognitive Status: Within  Functional Limits for tasks assessed                                     General Comments  dressing on R thigh intact    Exercises     Shoulder Instructions      Home Living                                          Prior Functioning/Environment Level of Independence: Independent with assistive device(s)        Comments: from [place] independent living        OT Problem List: Decreased activity tolerance;Decreased knowledge of precautions;Decreased knowledge of use of DME or AE;Decreased safety awareness;Decreased strength      OT Treatment/Interventions: Self-care/ADL training;Therapeutic activities;Therapeutic exercise;DME and/or AE instruction;Patient/family education;Balance training    OT Goals(Current goals can be found in the care plan section) Acute Rehab OT Goals Patient Stated Goal: pt wants to get back home OT Goal Formulation: With patient Time For Goal Achievement: 11/03/17 Potential to Achieve Goals: Good  OT Frequency: Min 2X/week   Barriers to D/C:    lives in [facility] living and uncertain if facility can (A)       Co-evaluation PT/OT/SLP Co-Evaluation/Treatment: Yes Reason for Co-Treatment: To address functional/ADL transfers;For patient/therapist safety   OT goals addressed during session: Strengthening/ROM;Proper use of Adaptive equipment and DME;ADL's and self-care      AM-PAC PT "6 Clicks" Daily Activity     Outcome Measure Help from another person eating meals?: None Help from another person taking care of personal grooming?: A Lot Help from another person toileting, which includes using toliet, bedpan, or urinal?: A Lot Help from another person bathing (including washing, rinsing, drying)?: A Lot Help from another person to put on and taking off regular upper body clothing?: A Lot Help from another person to put on and taking off regular lower body clothing?: A Lot 6 Click Score: 14   End of Session  Equipment Utilized During Treatment: Gait belt;Rolling walker Nurse Communication: Mobility status;Precautions  Activity Tolerance: Patient tolerated treatment well Patient left: in chair;with call bell/phone within reach  OT Visit Diagnosis: Unsteadiness on feet (R26.81)                Time: 1937-9024 OT Time Calculation (min): 40 min Charges:  OT General Charges $OT Visit: 1 Visit OT Evaluation $OT Eval Moderate Complexity: 1 Mod OT Treatments $Self Care/Home Management : 8-22 mins G-Codes:      Jeri Modena   OTR/L Pager: [phone] Office: [phone] .   Parke Poisson B 10/20/2017, 10:06 AM

## 2017-10-20 NOTE — Social Work (Signed)
CSW met with patient and daughter at bedside to discuss SNF.  Patient declined SNF and indicated that she will go home with home services. CSW validated and verified her home agency. CSW will share with RNCM that patient would like [facility] and pt knows which therapist she would like at home.  CSW will sign off for now as social work intervention is no longer needed. Please consult [place] again if new need arises.  Barbara Hefty, LCSW Clinical Social Worker [phone]

## 2017-10-20 NOTE — Clinical Social Work Note (Signed)
Clinical Social Work Assessment  Patient Details  Name: Barbara Kelley MRN: [id number] Date of Birth: 09-08-1934  Date of referral:  10/18/17               Reason for consult:  Facility Placement                Permission sought to share information with:  Family Supports Permission granted to share information::  Yes, Verbal Permission Granted  Name::     Nat Math   Agency::  family   Relationship::  daughter   Contact Information:     Housing/Transportation Living arrangements for the past 2 months:  Independent Living Facility([facility]) Source of Information:  Patient Patient Interpreter Needed:  None Criminal Activity/Legal Involvement Pertinent to Current Situation/Hospitalization:  No - Comment as needed Significant Relationships:  Adult Children, Other Family Members Lives with:  Self Do you feel safe going back to the place where you live?  Yes Need for family participation in patient care:  Yes (Comment)  Care giving concerns:  Pt has new impairment and clinical team is recommending SNF. Pt resides in independent living at [facility]. Pt understands that recommendation however undecided on whether she should go to SNF as she has made some progress. Pt indicated that she will discuss with daughter and let CSW know.  CSW will have back up plan for SNF and patient is interested in [facility] if she decides to go to SNF.  CSW explained SNF process and answered questions.   Social Worker assessment / plan:  CSW will complete disposition.  Employment status:  Retired Research officer, political [employer]) PT Recommendations:  Not assessed at this time Information / Referral to community resources:  [place]  Patient/Family's Response to care:  Patient appreciative of CSW assistance with discharge. Pt still unsure if she will decide on SNF or return home.  Patient/Family's Understanding of and Emotional Response to Diagnosis,  Current Treatment, and Prognosis:  Pt has good understanding of her impairment and her limitations. Since she has made some progress with therapy, she is trying to decide if she will return home with home health or go to SNF. She will decide and let CSW know her decision. Pt agreed to let CSW  work on SNF placement as a backup.  Emotional Assessment Appearance:  Appears stated age Attitude/Demeanor/Rapport:    Affect (typically observed):  Anxious, Pleasant Orientation:  Oriented to Situation, Oriented to Self, Oriented to Place, Oriented to  Time Alcohol / Substance use:  Not Applicable Psych involvement (Current and /or in the community):  No (Comment)  Discharge Needs  Concerns to be addressed:  No discharge needs identified Readmission within the last 30 days:  No Current discharge risk:  None Barriers to Discharge:  Continued Medical Work up   [employer], [place] 10/20/2017, 1:27 PM

## 2017-10-20 NOTE — Evaluation (Signed)
Physical Therapy Evaluation Patient Details Name: Barbara Kelley MRN: [id number] DOB: 1934/02/14 Today's Date: 10/20/2017   History of Present Illness  Barbara Kelley got up from bed to go to the bathroom, got dizzy, and fell. She had immediate right hip; R hip fx, now s/p R hip hemiarthroplasty, posterior approach, WBAT; Vertigo-like symptoms preceded fall  Clinical Impression   Patient is s/p above surgery resulting in functional limitations due to the deficits listed below (see PT Problem List). Presents with decr functional mobility and decr activity tolerance; Recommend SNF for post-acute rehab to maximize independence and safety with mobility prior to dc back to independent living apartment; Noted BP drop in standing as noted below;  Patient will benefit from skilled PT to increase their independence and safety with mobility to allow discharge to the venue listed below.      10/20/17 1100  Vital Signs  Patient Position (if appropriate) Orthostatic Vitals  Orthostatic Lying   BP- Lying 133/77  Pulse- Lying 93  Orthostatic Sitting  BP- Sitting 121/75  Pulse- Sitting 90  Orthostatic Standing at 0 minutes  BP- Standing at 0 minutes 99/75  Pulse- Standing at 0 minutes 98       Follow Up Recommendations SNF    Equipment Recommendations  Rolling walker with 5" wheels;3in1 (PT)    Recommendations for Other Services OT consult(as ordered)     Precautions / Restrictions Precautions Precautions: Posterior Hip Precaution Booklet Issued: Yes (comment) Precaution Comments: handout provided and reviewed for adls Restrictions Weight Bearing Restrictions: Yes RLE Weight Bearing: Weight bearing as tolerated      Mobility  Bed Mobility Overal bed mobility: Needs Assistance Bed Mobility: Rolling;Supine to Sit Rolling: Min assist   Supine to sit: +2 for physical assistance;Mod assist     General bed mobility comments: pt needs cues for hip precautions and safety . Pt needed pillow  between knees for abduction  Transfers Overall transfer level: Needs assistance Equipment used: Rolling walker (2 wheeled) Transfers: Sit to/from Stand Sit to Stand: Min assist         General transfer comment: pt with cues for hand placement with mod cues  Ambulation/Gait Ambulation/Gait assistance: Min assist;+2 safety/equipment Ambulation Distance (Feet): 5 Feet Assistive device: Rolling walker (2 wheeled) Gait Pattern/deviations: Step-to pattern     General Gait Details: Cues for sequence and posterior hip prec, especially with turning to chair  Stairs            Wheelchair Mobility    Modified Rankin (Stroke Patients Only)       Balance Overall balance assessment: Needs assistance Sitting-balance support: Bilateral upper extremity supported;Feet supported Sitting balance-Leahy Scale: Good     Standing balance support: Bilateral upper extremity supported;During functional activity Standing balance-Leahy Scale: Fair                               Pertinent Vitals/Pain Pain Assessment: 0-10 Pain Score: 1  Pain Location: R LEG Pain Descriptors / Indicators: Sore Pain Intervention(s): Premedicated before session    Home Living                        Prior Function Level of Independence: Independent with assistive device(s)         Comments: from [place] independent [facility]     Hand Dominance   Dominant Hand: Right    Extremity/Trunk Assessment   Upper Extremity Assessment Upper Extremity Assessment:  Defer to OT evaluation    Lower Extremity Assessment Lower Extremity Assessment: RLE deficits/detail RLE Deficits / Details: Grossly decr AROM and strength, limited by pain and posterior prec postop    Cervical / Trunk Assessment Cervical / Trunk Assessment: Normal  Communication   Communication: No difficulties  Cognition Arousal/Alertness: Awake/alert Behavior During Therapy: WFL for tasks  assessed/performed Overall Cognitive Status: Within Functional Limits for tasks assessed                                        General Comments General comments (skin integrity, edema, etc.): dressing on R thigh intact    Exercises     Assessment/Plan    PT Assessment Patient needs continued PT services  PT Problem List Decreased strength;Decreased range of motion;Decreased activity tolerance;Decreased balance;Decreased mobility;Decreased coordination;Decreased knowledge of use of DME;Decreased safety awareness;Decreased knowledge of precautions;Pain       PT Treatment Interventions DME instruction;Gait training;Functional mobility training;Therapeutic activities;Therapeutic exercise;Balance training;Patient/family education    PT Goals (Current goals can be found in the Care Plan section)  Acute Rehab PT Goals Patient Stated Goal: pt wants to get back home PT Goal Formulation: With patient Time For Goal Achievement: 11/03/17 Potential to Achieve Goals: Good    Frequency Min 2X/week   Barriers to discharge        Co-evaluation PT/OT/SLP Co-Evaluation/Treatment: Yes Reason for Co-Treatment: To address functional/ADL transfers PT goals addressed during session: Mobility/safety with mobility OT goals addressed during session: Strengthening/ROM;Proper use of Adaptive equipment and DME;ADL's and self-care       AM-PAC PT "6 Clicks" Daily Activity  Outcome Measure Difficulty turning over in bed (including adjusting bedclothes, sheets and blankets)?: Unable Difficulty moving from lying on back to sitting on the side of the bed? : A Lot Difficulty sitting down on and standing up from a chair with arms (e.g., wheelchair, bedside commode, etc,.)?: Unable Help needed moving to and from a bed to chair (including a wheelchair)?: A Lot Help needed walking in hospital room?: A Lot Help needed climbing 3-5 steps with a railing? : A Lot 6 Click Score: 10    End of  Session Equipment Utilized During Treatment: Gait belt Activity Tolerance: Patient tolerated treatment well;Other (comment)(though noted element of postural hypotension) Patient left: in chair;with call bell/phone within reach;with chair alarm set Nurse Communication: Mobility status PT Visit Diagnosis: Unsteadiness on feet (R26.81);Other abnormalities of gait and mobility (R26.89);History of falling (Z91.81);Pain Pain - Right/Left: Right Pain - part of body: Hip    Time: 0926-1006 PT Time Calculation (min) (ACUTE ONLY): 40 min   Charges:   PT Evaluation $PT Eval Moderate Complexity: 1 Mod     PT G Codes:        Roney Marion, PT  Acute Rehabilitation Services Pager [phone] Office [phone]   [place] 10/20/2017, 11:25 AM

## 2017-10-20 NOTE — Progress Notes (Signed)
Orthopedic Tech Progress Note Patient Details:  Barbara Kelley [birth year] [id number]  Patient ID: Barbara Kelley, female   DOB: July 21, 1934, 84 y.o.   MRN: [id number] Pt cant have ohf due to age restrictions  [name] 10/20/2017, 7:02 PM

## 2017-10-21 ENCOUNTER — Other Ambulatory Visit (HOSPITAL_COMMUNITY): Payer: Medicare Other

## 2017-10-21 DIAGNOSIS — S72001A Fracture of unspecified part of neck of right femur, initial encounter for closed fracture: Secondary | ICD-10-CM

## 2017-10-21 LAB — BASIC METABOLIC PANEL
ANION GAP: 10 (ref 5–15)
BUN: 14 mg/dL (ref 6–20)
CO2: 26 mmol/L (ref 22–32)
Calcium: 8.7 mg/dL — ABNORMAL LOW (ref 8.9–10.3)
Chloride: 103 mmol/L (ref 101–111)
Creatinine, Ser: 0.54 mg/dL (ref 0.44–1.00)
GFR calc Af Amer: >60 mL/min (ref >60)
GLUCOSE: 117 mg/dL — AB (ref 65–99)
POTASSIUM: 3.6 mmol/L (ref 3.5–5.1)
Sodium: 139 mmol/L (ref 135–145)

## 2017-10-21 LAB — ECHOCARDIOGRAM COMPLETE
Height: 59 in
WEIGHTICAEL: 2000 [oz_av]

## 2017-10-21 MED ORDER — SULFAMETHOXAZOLE-TRIMETHOPRIM 800-160 MG PO TABS: 1.0000 | ORAL_TABLET | Freq: Two times a day (BID) | ORAL | 0 refills | Status: AC

## 2017-10-21 MED ORDER — SULFAMETHOXAZOLE-TRIMETHOPRIM 800-160 MG PO TABS
1.0000 | ORAL_TABLET | Freq: Two times a day (BID) | ORAL | Status: DC
  Administered 2017-10-21: 1 via ORAL
  Filled 2017-10-21: qty 1

## 2017-10-21 MED ORDER — SULFAMETHOXAZOLE-TRIMETHOPRIM 400-80 MG PO TABS
1.0000 | ORAL_TABLET | Freq: Two times a day (BID) | ORAL | Status: DC
  Filled 2017-10-21: qty 1

## 2017-10-21 MED ORDER — SACUBITRIL-VALSARTAN 24-26 MG PO TABS: 1.0000 | ORAL_TABLET | Freq: Every day | ORAL | 0 refills | Status: DC

## 2017-10-21 NOTE — Discharge Summary (Signed)
Physician Discharge Summary  Barbara Kelley MRN: [id number] DOB/AGE: 1934/08/29 84 y.o.  PCP: [name], MD   Admit date: 10/18/2017 Discharge date: 10/21/2017  Discharge Diagnoses:    Principal Problem:   Closed subcapital fracture of neck of femur, right, initial encounter [facility]) Active Problems:   History of CVA (cerebrovascular accident)   Hypertension   NICM (nonischemic cardiomyopathy) ([place])   Chronic systolic heart failure ([place])   HLD (hyperlipidemia)   Near syncope   Closed fracture of right hip ([place])    Follow-up recommendations Follow-up with PCP in 3-5 days , including all  additional recommended appointments as below Follow-up CBC, CMP in 3-5 days Patient advised to resume Entresto after talking to her PCP/cardiology     Allergies as of 10/21/2017      Reactions   Doxycycline Nausea Only   Vertigo   Macrobid [nitrofurantoin] Other (See Comments)   Nerve pain   Valsartan Other (See Comments)   Blood pressure rise   Lisinopril Cough   Calcium Channel Blockers Palpitations   Blow pressure rise / tachycardia    Penicillins Rash   Has patient had a PCN reaction causing immediate rash, facial/tongue/throat swelling, SOB or lightheadedness with hypotension: Yes Has patient had a PCN reaction causing severe rash involving mucus membranes or skin necrosis: Yes Has patient had a PCN reaction that required hospitalization: Unk Has patient had a PCN reaction occurring within the last 10 years: No If all of the above answers are "NO", then may proceed with Cephalosporin use.      Medication List    STOP taking these medications   HYDROcodone-acetaminophen 7.5-325 MG tablet Commonly known as:  NORCO     TAKE these medications   acetaminophen 500 MG tablet Commonly known as:  TYLENOL Take 2 tablets (1,000 mg total) by mouth every 8 (eight) hours.   aspirin 81 MG chewable tablet Commonly known as:  ASPIRIN CHILDRENS Chew 1 tablet (81 mg total) by mouth  2 (two) times daily. Take for 4 weeks, then resume regular dose.   b complex vitamins tablet Take 1 tablet by mouth daily.   CALCIUM-MAGNESIUM-ZINC PO Take 1 capsule by mouth daily.   calcium-vitamin D 500-200 MG-UNIT tablet Take 1 tablet by mouth daily.   carvedilol 3.125 MG tablet Commonly known as:  COREG Take 3.125 mg by mouth 2 (two) times daily with a meal.   CoQ10 200 MG Caps Take 1 capsule by mouth daily.   docusate sodium 100 MG capsule Commonly known as:  COLACE Take 1 capsule (100 mg total) by mouth 2 (two) times daily.   ferrous sulfate 325 (65 FE) MG tablet Commonly known as:  FERROUSUL Take 1 tablet (325 mg total) by mouth 3 (three) times daily with meals. What changed:  when to take this   FISH OIL PO Take 1 capsule by mouth daily.   furosemide 20 MG tablet Commonly known as:  LASIX Take 20 mg by mouth daily.   GINKGO BILOBA PO Take 1 capsule by mouth 2 (two) times daily.   methocarbamol 500 MG tablet Commonly known as:  ROBAXIN Take 1 tablet (500 mg total) by mouth every 6 (six) hours as needed for muscle spasms.   multivitamin with minerals tablet Take 1 tablet by mouth daily.   polyethylene glycol packet Commonly known as:  MIRALAX / GLYCOLAX Take 17 g by mouth 2 (two) times daily.   PROBIOTIC PO Take 1 tablet by mouth daily.   sacubitril-valsartan 24-26 MG Commonly known  as:  ENTRESTO Take 1 tablet by mouth daily. DISCUSS WITH PCP/ CARDIOLOGY PRIOR TO RESUMING Start taking on:  11/04/2017 What changed:    additional instructions  These instructions start on 11/04/2017. If you are unsure what to do until then, ask your doctor or other care provider.   sulfamethoxazole-trimethoprim 800-160 MG tablet Commonly known as:  BACTRIM DS,SEPTRA DS Take 1 tablet by mouth every 12 (twelve) hours for 3 days.   traMADol 50 MG tablet Commonly known as:  ULTRAM Take 1-2 tablets (50-100 mg total) by mouth every 6 (six) hours as needed.             Durable Medical Equipment  (From admission, onward)        Start     Ordered   10/21/17 0843  For home use only DME Walker rolling  Once    Question:  Patient needs a walker to treat with the following condition  Answer:  Hip fracture [facility])   10/21/17 0843         Discharge Condition:    Discharge Instructions Get Medicines reviewed and adjusted: Please take all your medications with you for your next visit with your Primary MD  Please request your Primary MD to go over all hospital tests and procedure/radiological results at the follow up, please ask your Primary MD to get all Hospital records sent to his/her office.  If you experience worsening of your admission symptoms, develop shortness of breath, life threatening emergency, suicidal or homicidal thoughts you must seek medical attention immediately by calling 911 or calling your MD immediately if symptoms less severe.  You must read complete instructions/literature along with all the possible adverse reactions/side effects for all the Medicines you take and that have been prescribed to you. Take any new Medicines after you have completely understood and accpet all the possible adverse reactions/side effects.   Do not drive when taking Pain medications.   Do not take more than prescribed Pain, Sleep and Anxiety Medications  Special Instructions: If you have smoked or chewed Tobacco in the last 2 yrs please stop smoking, stop any regular Alcohol and or any Recreational drug use.  Wear Seat belts while driving.  Please note  You were cared for by a hospitalist during your hospital stay. Once you are discharged, your primary care physician will handle any further medical issues. Please note that NO REFILLS for any discharge medications will be authorized once you are discharged, as it is imperative that you return to your primary care physician (or establish a relationship with a primary care physician if you do not  have one) for your aftercare needs so that they can reassess your need for medications and monitor your lab values.     Allergies  Allergen Reactions  . Doxycycline Nausea Only    Vertigo  . Macrobid [Nitrofurantoin] Other (See Comments)    Nerve pain  . Valsartan Other (See Comments)    Blood pressure rise  . Lisinopril Cough  . Calcium Channel Blockers Palpitations    Blow pressure rise / tachycardia   . Penicillins Rash    Has patient had a PCN reaction causing immediate rash, facial/tongue/throat swelling, SOB or lightheadedness with hypotension: Yes Has patient had a PCN reaction causing severe rash involving mucus membranes or skin necrosis: Yes Has patient had a PCN reaction that required hospitalization: Unk Has patient had a PCN reaction occurring within the last 10 years: No If all of the above answers are "NO", then  may proceed with Cephalosporin use.       Disposition: Home with home health   Consults:  Orthopedics    Significant Diagnostic Studies:  Ct Head Wo Contrast  Result Date: 10/18/2017 CLINICAL DATA:  84 year old female who awoke with dizziness and fell while walking to the bathroom. Posttraumatic headache. EXAM: CT HEAD WITHOUT CONTRAST CT CERVICAL SPINE WITHOUT CONTRAST TECHNIQUE: Multidetector CT imaging of the head and cervical spine was performed following the standard protocol without intravenous contrast. Multiplanar CT image reconstructions of the cervical spine were also generated. COMPARISON:  Chest CTA 09/18/2016 FINDINGS: CT HEAD FINDINGS Brain: Patchy and confluent bilateral cerebral white matter hypodensity associated with hypodensity in the left caudate nucleus and mild ex vacuo enlargement of the adjacent left frontal horn. Mild deep gray matter hypodensity and heterogeneity elsewhere. Small chronic appearing lacunar infarct of the inferior right cerebellum. No cortical encephalomalacia identified. No midline shift, ventriculomegaly, mass  effect, evidence of mass lesion, [name], normal variant.  No skull fracture identified. Sinuses/Orbits: Mild mucosal thickening in the ethmoids. Scattered mucosal thickening and/or small mucous retention cysts in both maxillary sinuses. Otherwise clear. Other: Visualized orbit soft tissues are within normal limits. Suspected right right posterior convexity scalp soft tissue contusion (series 5, image 43). No other acute scalp soft tissue findings identified. CT CERVICAL SPINE FINDINGS Alignment: Mild straightening of cervical lordosis. Bilateral posterior element alignment is within normal limits. Cervicothoracic junction alignment is within normal limits. Skull base and vertebrae: Visualized skull base is intact. No atlanto-occipital dissociation. No cervical spine fracture identified. Soft tissues and spinal canal: No prevertebral fluid or swelling. No visible canal hematoma. Negative noncontrast neck soft tissues aside from calcified carotid artery atherosclerosis. Disc levels: Bulky osteophytosis along the anterior C1-odontoid articulation. Multilevel cervical facet hypertrophy greater on the right. Chronic disc space loss and endplate degeneration at C5-C6 and C6-C7. Up to mild lower cervical spinal stenosis. Upper chest: Visible upper thoracic levels appear intact. Pulmonary septal thickening in the lung apices. IMPRESSION: 1. Posterior right scalp contusion suspected. No underlying skull fracture. 2. Age indeterminate but mostly chronic appearing small vessel ischemia in the cerebral white matter, deep gray matter nuclei, and right cerebellum. 3. No acute fracture in the cervical spine. Cervical facet, disc, and endplate degeneration with up to mild degenerative lower cervical spinal stenosis. Electronically Signed   By:  Genevie Ann M.D.   On: 10/18/2017 09:52   Ct Cervical Spine Wo Contrast  Result Date: 10/18/2017 CLINICAL DATA:  84 year old female who awoke with dizziness and fell while walking to the bathroom. Posttraumatic headache. EXAM: CT HEAD WITHOUT CONTRAST CT CERVICAL SPINE WITHOUT CONTRAST TECHNIQUE: Multidetector CT imaging of the head and cervical spine was performed following the standard protocol without intravenous contrast. Multiplanar CT image reconstructions of the cervical spine were also generated. COMPARISON:  Chest CTA 09/18/2016 FINDINGS: CT HEAD FINDINGS Brain: Patchy and confluent bilateral cerebral white matter hypodensity associated with hypodensity in the left caudate nucleus and mild ex vacuo enlargement of the adjacent left frontal horn. Mild deep gray matter hypodensity and heterogeneity elsewhere. Small chronic appearing lacunar infarct of the inferior right cerebellum. No cortical encephalomalacia identified. No midline shift, ventriculomegaly, mass effect, evidence of mass lesion, [name], normal variant.  No skull fracture  identified. Sinuses/Orbits: Mild mucosal thickening in the ethmoids. Scattered mucosal thickening and/or small mucous retention cysts in both maxillary sinuses. Otherwise clear. Other: Visualized orbit soft tissues are within normal limits. Suspected right right posterior convexity scalp soft tissue contusion (series 5, image 43). No other acute scalp soft tissue findings identified. CT CERVICAL SPINE FINDINGS Alignment: Mild straightening of cervical lordosis. Bilateral posterior element alignment is within normal limits. Cervicothoracic junction alignment is within normal limits. Skull base and vertebrae: Visualized skull base is intact. No atlanto-occipital dissociation. No cervical  spine fracture identified. Soft tissues and spinal canal: No prevertebral fluid or swelling. No visible canal hematoma. Negative noncontrast neck soft tissues aside from calcified carotid artery atherosclerosis. Disc levels: Bulky osteophytosis along the anterior C1-odontoid articulation. Multilevel cervical facet hypertrophy greater on the right. Chronic disc space loss and endplate degeneration at C5-C6 and C6-C7. Up to mild lower cervical spinal stenosis. Upper chest: Visible upper thoracic levels appear intact. Pulmonary septal thickening in the lung apices. IMPRESSION: 1. Posterior right scalp contusion suspected. No underlying skull fracture. 2. Age indeterminate but mostly chronic appearing small vessel ischemia in the cerebral white matter, deep gray matter nuclei, and right cerebellum. 3. No acute fracture in the cervical spine. Cervical facet, disc, and endplate degeneration with up to mild degenerative lower cervical spinal stenosis. Electronically Signed   By: Genevie Ann M.D.   On: 10/18/2017 09:52   Pelvis Portable  Result Date: 10/19/2017 CLINICAL DATA:  Status post right hip replacement EXAM: PORTABLE PELVIS 1-2 VIEWS COMPARISON:  CT abdomen pelvis 09/18/2016, radiograph 10/18/2017 FINDINGS: Prior left hip replacement. Interval right hip replacement with normal alignment. Gas in the soft tissues consistent with recent operative status. Pubic symphysis and rami are intact IMPRESSION: Status post right hip replacement with expected postsurgical changes Electronically Signed   By: Donavan Foil M.D.   On: 10/19/2017 21:52   Dg Hip Unilat With Pelvis 2-3 Views Right  Result Date: 10/18/2017 CLINICAL DATA:  Pain following fall EXAM: DG HIP (WITH OR WITHOUT PELVIS) 2-3V RIGHT COMPARISON:  None. FINDINGS: Frontal pelvis as well as frontal and lateral right hip images were obtained. There is a subcapital femoral neck fracture on the right with mild impaction at the fracture site. No other fracture  evident. No dislocation. There is a total hip replacement on the left with visualized prosthetic components on the left appearing well-seated. There is moderate narrowing of the right hip joint. Bones are diffusely osteoporotic. IMPRESSION: Subcapital femoral neck fracture on the right with impaction at the fracture site. No other acute fracture. No dislocation. Narrowing right hip joint. Bones osteoporotic. Total hip replacement on the left with prosthetic components appearing well-seated in this area. Electronically Signed   By: Lowella Grip III M.D.   On: 10/18/2017 08:21     2-D echo Left ventricle:  The cavity size was moderately dilated. There was mild concentric hypertrophy. Systolic function was severely reduced. The estimated ejection fraction was in the range of 20% to 25%. Diffuse hypokinesis. Features are consistent with a pseudonormal left ventricular filling pattern, with concomitant abnormal relaxation and increased filling pressure (grade 2 diastolic dysfunction). Doppler parameters are consistent with elevated ventricular end-diastolic filling pressure.   Filed Weights   10/18/17 0715 10/19/17 1618  Weight: 56.7 kg (125 lb) 56.7 kg (125 lb 0 oz)     Microbiology: Recent Results (from the past 240 hour(s))  Surgical pcr screen     Status: None   Collection Time: 10/18/17  6:50 PM  Result Value Ref Range Status   MRSA, PCR NEGATIVE NEGATIVE Final   Staphylococcus aureus NEGATIVE NEGATIVE Final    Comment: (NOTE) The Xpert SA Assay (FDA approved for NASAL specimens in patients 22 years of age and older), is one component of a comprehensive surveillance program. It is not intended to diagnose infection nor to guide or monitor treatment.   Urine Culture     Status: Abnormal (Preliminary result)   Collection Time: 10/19/17  6:53 PM  Result Value Ref Range Status   Specimen Description URINE, CATHETERIZED  Final   Special Requests PATIENT ON FOLLOWING ANCEF 2GM AT  1811  Final   Culture 70,000 COLONIES/mL GRAM NEGATIVE RODS (A)  Final   Report Status PENDING  Incomplete       Blood Culture    Component Value Date/Time   SDES URINE, CATHETERIZED 10/19/2017 1853   SPECREQUEST PATIENT ON FOLLOWING ANCEF 2GM AT 7209 10/19/2017 1853   CULT 70,000 COLONIES/mL GRAM NEGATIVE RODS (A) 10/19/2017 1853   REPTSTATUS PENDING 10/19/2017 1853      Labs: Results for orders placed or performed during the hospital encounter of 10/18/17 (from the past 48 hour(s))  Urine Culture     Status: Abnormal (Preliminary result)   Collection Time: 10/19/17  6:53 PM  Result Value Ref Range   Specimen Description URINE, CATHETERIZED    Special Requests PATIENT ON FOLLOWING ANCEF 2GM AT 1811    Culture 70,000 COLONIES/mL GRAM NEGATIVE RODS (A)    Report Status PENDING   CBC     Status: Abnormal   Collection Time: 10/20/17  5:04 AM  Result Value Ref Range   WBC 15.3 (H) 4.0 - 10.5 K/uL   RBC 4.34 3.87 - 5.11 MIL/uL   Hemoglobin 12.2 12.0 - 15.0 g/dL   HCT 37.9 36.0 - 46.0 %   MCV 87.3 78.0 - 100.0 fL   MCH 28.1 26.0 - 34.0 pg   MCHC 32.2 30.0 - 36.0 g/dL   RDW 13.2 11.5 - 15.5 %   Platelets 175 150 - 400 K/uL  Basic metabolic panel     Status: Abnormal   Collection Time: 10/20/17  5:04 AM  Result Value Ref Range   Sodium 137 135 - 145 mmol/L   Potassium 3.6 3.5 - 5.1 mmol/L   Chloride 103 101 - 111 mmol/L   CO2 22 22 - 32 mmol/L   Glucose, Bld 209 (H) 65 - 99 mg/dL   BUN 13 6 - 20 mg/dL   Creatinine, Ser 0.60 0.44 - 1.00 mg/dL   Calcium 9.3 8.9 - 10.3 mg/dL   GFR calc non Af Amer >60 >60 mL/min   GFR calc Af Amer >60 >60 mL/min    Comment: (NOTE) The eGFR has been calculated using the CKD EPI equation. This calculation has not been validated in all clinical situations. eGFR's persistently <60 mL/min signify possible Chronic Kidney Disease.    Anion gap 12 5 - 15     Lipid Panel  No results found for: CHOL, TRIG, HDL, CHOLHDL, VLDL, LDLCALC,  LDLDIRECT   No results found for: HGBA1C   Lab Results  Component Value Date   CREATININE 0.60 10/20/2017     HPI :  JAKAILA NORMENT is a 84 y.o. 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  Patient reports that one week ago she stopped her Delene Loll  secondary to increased urinary output noting she did not notify her cardiologist that she stopped this medication.  Patient reported that she got up in the middle the night to go to the bathroom became dizzy and fell.  She denies LOC.  She fell onto her right hip and noticed immediate pain.  EMS was called at the facility.  Imaging the ER revealed subcapital femoral neck fracture on the right with impaction at the fracture site.  CT of the head showed no evidence of acute stroke or bleed.  No other traumatic injuries noted other than CT evidence of a right scalp contusion.  Patient has been evaluated by orthopedic team and eventual surgical repair planned.      Admitted for femoral neck fracture. 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 Patient was also on Lasix which has been resumed.   Blood pressure has been stable  , except noted to have isolated low blood pressure on 1/16 -Echocardiogram  showed EF of 20-25% with diffuse hypokinesis and grade 2 diastolic dysfunction -Currently no neurological deficits on exam, orthostatics negative prior to discharge  Essential hypertension -Stable, Continue Coreg  , holding entresto  Nonischemic cardiomyopathy/chronic systolic heart failure -Patient follows with Dr. Wynonia Lawman, cardiology -Echocardiogram in 2018, EF of 25-30% (record not in Epic) -recently discontinued Entresto- she should follow up with Dr. Wynonia Lawman and discuss this  -Patient appears euvolemic and compensated -Lasix currently held due to dizziness, may resume at DC  -Continue Coreg  Leukocytosis -Suspect reactive secondary to the above -WBC currently returning downward, continue to monitor CBC -UA obtained- rare bacteria, 0-5 WBC, positive nitrites, trace leukocytes.  Patient currently denies any urinary symptoms. -Urine culture  showed 70,000 colonies of gram-negative rods, will empirically start patient on  Bactrim for 3 days [patient is allergic to penicillin]  Hyperlipidemia -Continue omega-3    Discharge Exam:   Blood pressure 131/80, pulse 86, temperature 97.7 F (36.5 C), temperature source Oral, resp. rate 16, height 4' 11.02" (1.499 m), weight 56.7 kg (125 lb 0 oz), SpO2 98 %.   General: Well developed, well nourished, NAD, appears stated age  HEENT: NCAT, mucous membranes moist.   Cardiovascular: S1 S2 auscultated, RRR  Respiratory: Clear to auscultation bilaterally with equal chest rise, no wheezing  Abdomen: Soft, nontender, nondistended, + bowel sounds  Extremities: warm dry without cyanosis clubbing. Dressing clean/intact on right hip, mild  edema.       Follow-up Information    Paralee Cancel, MD. Schedule an appointment as soon as possible for a visit in 2 week(s).   Specialty:  Orthopedic Surgery Contact information: [address] [place] [address] [phone]        [name], MD.  Call.   Specialty:  Family Medicine Why:  Follow-up with PCP in 3-5 days, check CBC, BMP on this visit Contact information: [place] [address] [phone]           Signed: Reyne Dumas 10/21/2017, 8:49 AM        Time spent >1 hour

## 2017-10-21 NOTE — Progress Notes (Signed)
Occupational Therapy Treatment Patient Details Name: Barbara Kelley MRN: [id number] DOB: [birth year] Today's Date: 10/21/2017    History of present illness Barbara Kelley got up from bed to go to the bathroom, got dizzy, and fell. She had immediate right hip; R hip fx, now s/p R hip hemiarthroplasty, posterior approach, WBAT; Vertigo-like symptoms preceded fall   OT comments  Pt progressing towards acute OT goals. Focus of session was in-room functional mobility/transfers, 1 grooming task standing at sink, and bed mobility. Pt received sitting up in recliner. BP assessed in sitting position (143/86) and upon standing (141/86). Pt reporting no dizziness/lightheadedness throughout session. Did report some general fatigue. D/c plan update to [facility] therapies. Daughter assisting at d/c.  Follow Up Recommendations  Home health OT    Equipment Recommendations  3 in 1 bedside commode    Recommendations for Other Services      Precautions / Restrictions Precautions Precautions: Posterior Hip Precaution Booklet Issued: Yes (comment) Precaution Comments: able to state 3/3 hip precautions Restrictions Weight Bearing Restrictions: Yes RLE Weight Bearing: Weight bearing as tolerated       Mobility Bed Mobility Overal bed mobility: Needs Assistance Bed Mobility: Sit to Supine       Sit to supine: Min assist;Mod assist   General bed mobility comments: assist to advance BLE to maintain precautions. cues for technique  Transfers Overall transfer level: Needs assistance Equipment used: Rolling walker (2 wheeled) Transfers: Sit to/from Stand Sit to Stand: Min guard         General transfer comment: light steadying assist. Cues for technique. from recliner and to EOB    Balance Overall balance assessment: Needs assistance Sitting-balance support: Bilateral upper extremity supported;Feet supported Sitting balance-Leahy Scale: Good     Standing balance support: Bilateral upper extremity  supported;During functional activity Standing balance-Leahy Scale: Fair                             ADL either performed or assessed with clinical judgement   ADL Overall ADL's : Needs assistance/impaired     Grooming: Wash/dry hands;Min Dispensing optician: Min guard;Ambulation;BSC Toilet Transfer Details (indicate cue type and reason): ambulated to/from bathroom         Functional mobility during ADLs: Min guard;Rolling walker General ADL Comments: Pt received in recliner. functional mobility to/from bathroom, stood to wash hands, bed mobility completed as detailed above.      Vision Baseline Vision/History: Wears glasses Wears Glasses: At all times     Perception     Praxis      Cognition Arousal/Alertness: Awake/alert Behavior During Therapy: [facility] for tasks assessed/performed Overall Cognitive Status: Within Functional Limits for tasks assessed                                          Exercises     Shoulder Instructions       General Comments Pt reporting no dizziness throughout session. Did report some generalized fatigue.     Pertinent Vitals/ Pain       Pain Assessment: Faces Faces Pain Scale: Hurts little more Pain Location: R LEG Pain Descriptors / Indicators: Sore Pain Intervention(s): Limited activity within patient's tolerance;Monitored during session;Repositioned  Home Living Family/patient expects to be discharged to:: Other (  Comment)(IDL [facility])   Available Help at Discharge: Family Type of Home: Independent living facility             Bathroom Shower/Tub: Walk-in shower         Home Equipment: Shower seat(cane)   Additional Comments: high toilet and walk in shower. Pt has a seat. Pt used a cane for ambulating      Prior Functioning/Environment Level of Independence: Independent with assistive device(s)        Comments: from [place]  independent living   Frequency  Min 2X/week        Progress Toward Goals  OT Goals(current goals can now be found in the care plan section)  Progress towards OT goals: Progressing toward goals  Acute Rehab OT Goals Patient Stated Goal: pt wants to get back home OT Goal Formulation: With patient Time For Goal Achievement: 11/03/17 Potential to Achieve Goals: Good ADL Goals Pt Will Perform Lower Body Bathing: with min guard assist;with adaptive equipment;sit to/from stand Pt Will Perform Lower Body Dressing: with min guard assist;sit to/from stand;with adaptive equipment Pt Will Transfer to Toilet: with min guard assist;ambulating;bedside commode  Plan Discharge plan remains appropriate    Co-evaluation                 AM-PAC PT "6 Clicks" Daily Activity     Outcome Measure   Help from another person eating meals?: None Help from another person taking care of personal grooming?: A Little Help from another person toileting, which includes using toliet, bedpan, or urinal?: A Little Help from another person bathing (including washing, rinsing, drying)?: A Lot Help from another person to put on and taking off regular upper body clothing?: A Little Help from another person to put on and taking off regular lower body clothing?: A Lot 6 Click Score: 17    End of Session Equipment Utilized During Treatment: Gait belt;Rolling walker  OT Visit Diagnosis: Unsteadiness on feet (R26.81)   Activity Tolerance Patient tolerated treatment well   Patient Left in bed;with SCD's reapplied;with call bell/phone within reach   Nurse Communication          Time: 1779-3903 OT Time Calculation (min): 22 min  Charges: OT General Charges $OT Visit: 1 Visit OT Treatments $Self Care/Home Management : 8-22 mins     Hortencia Pilar 10/21/2017, 11:57 AM

## 2017-10-21 NOTE — Progress Notes (Signed)
Pt given prescriptions and discharge instructions. Instructions gone over with her and daughter and answered all questions to satisfaction. All belongings gathered to be sent home. Pt in no distress at time of discharge. Daughter taking pt home.

## 2017-10-21 NOTE — Progress Notes (Signed)
Physical Therapy Treatment Patient Details Name: Barbara Kelley MRN: [id number] DOB: Aug 31, 1934 Today's Date: 10/21/2017    History of Present Illness Barbara Kelley got up from bed to go to the bathroom, got dizzy, and fell. She had immediate right hip; R hip fx, now s/p R hip hemiarthroplasty, posterior approach, WBAT; Vertigo-like symptoms preceded fall    PT Comments    Pt progressing well towards physical therapy goals. Was able to ambulate ~200 feet with hands-on guarding for safety but no physical assist. Noted pt is refusing SNF and wishes to return to her independent living facility at d/c. Feel this is appropriate with 24 hour assistance initially and home health PT to follow up.   Orthostatic BPs  Supine 142/91  HR 87 bpm  Sitting 136/91 HR 92 bpm  Standing 141/79 HR 85 bpm  Standing after 5 min 144/82 HR 86 bpm     Follow Up Recommendations  Home health PT;Supervision/Assistance - 24 hour     Equipment Recommendations  Rolling walker with 5" wheels;3in1 (PT)    Recommendations for Other Services       Precautions / Restrictions Precautions Precautions: Posterior Hip Precaution Booklet Issued: Yes (comment) Precaution Comments: able to state 3/3 hip precautions Restrictions Weight Bearing Restrictions: Yes RLE Weight Bearing: Weight bearing as tolerated    Mobility  Bed Mobility Overal bed mobility: Needs Assistance Bed Mobility: Supine to Sit     Supine to sit: Min guard Sit to supine: Min assist;Mod assist   General bed mobility comments: Close guard as pt elevated trunk to full sitting position. No assist to advance LE's to EOB.   Transfers Overall transfer level: Needs assistance Equipment used: Rolling walker (2 wheeled) Transfers: Sit to/from Stand Sit to Stand: Min guard         General transfer comment: VC's for hand placement on seated surface for safety. Pt was able to power-up to full standing position without assistance.    Ambulation/Gait Ambulation/Gait assistance: Min guard Ambulation Distance (Feet): 200 Feet   Gait Pattern/deviations: Step-through pattern Gait velocity: Decreased Gait velocity interpretation: Below normal speed for age/gender General Gait Details: VC's for improved posture. Noted pt with decreased knee flexion bilaterally and generally wide BOS.    Stairs            Wheelchair Mobility    Modified Rankin (Stroke Patients Only)       Balance Overall balance assessment: Needs assistance Sitting-balance support: Bilateral upper extremity supported;Feet supported Sitting balance-Leahy Scale: Good     Standing balance support: Bilateral upper extremity supported;During functional activity Standing balance-Leahy Scale: Fair                              Cognition Arousal/Alertness: Awake/alert Behavior During Therapy: WFL for tasks assessed/performed Overall Cognitive Status: Within Functional Limits for tasks assessed                                        Exercises      General Comments General comments (skin integrity, edema, etc.): Pt reporting no dizziness throughout session. Did report some generalized fatigue.       Pertinent Vitals/Pain Pain Assessment: Faces Faces Pain Scale: Hurts a little bit Pain Location: R LEG Pain Descriptors / Indicators: Sore Pain Intervention(s): Limited activity within patient's tolerance;Monitored during session;Repositioned    Home Living Family/patient expects  to be discharged to:: Other (Comment)(IDL [place])   Available Help at Discharge: Family Type of Home: Independent living facility       Home Equipment: Shower seat(cane) Additional Comments: high toilet and walk in shower. Pt has a seat. Pt used a cane for ambulating    Prior Function Level of Independence: Independent with assistive device(s)      Comments: from [place] independent living   PT Goals  (current goals can now be found in the care plan section) Acute Rehab PT Goals Patient Stated Goal: pt wants to get back home PT Goal Formulation: With patient Time For Goal Achievement: 11/03/17 Potential to Achieve Goals: Good Progress towards PT goals: Progressing toward goals    Frequency    Min 5X/week      PT Plan Frequency needs to be updated;Discharge plan needs to be updated    Co-evaluation              AM-PAC PT "6 Clicks" Daily Activity  Outcome Measure  Difficulty turning over in bed (including adjusting bedclothes, sheets and blankets)?: A Little Difficulty moving from lying on back to sitting on the side of the bed? : A Little Difficulty sitting down on and standing up from a chair with arms (e.g., wheelchair, bedside commode, etc,.)?: A Little Help needed moving to and from a bed to chair (including a wheelchair)?: A Little Help needed walking in hospital room?: A Little Help needed climbing 3-5 steps with a railing? : A Little 6 Click Score: 18    End of Session Equipment Utilized During Treatment: Gait belt Activity Tolerance: Patient tolerated treatment well Patient left: in chair;with call bell/phone within reach;with chair alarm set;with family/visitor present Nurse Communication: Mobility status PT Visit Diagnosis: Unsteadiness on feet (R26.81);Other abnormalities of gait and mobility (R26.89);History of falling (Z91.81);Pain Pain - Right/Left: Right Pain - part of body: Hip     Time: 7096-2836 PT Time Calculation (min) (ACUTE ONLY): 35 min  Charges:  $Gait Training: 23-37 mins                    G Codes:       Barbara Kelley, PT, DPT Acute Rehabilitation Services Pager: [phone]    Barbara Kelley 10/21/2017, 12:48 PM

## 2017-10-21 NOTE — Care Management Note (Signed)
Case Management Note  Patient Details  Name: Barbara Kelley MRN: [id number] Date of Birth: 09/06/1934  Subjective/Objective:   84 yr old female s/p  Right hip hemiarthroplasty, posterior approach.                 Action/Plan: Case manager spoke with patient concerning discharge plan and DME. Choice for [place] was offered, referral was called to Christa See, [facility].  Patient says she has RW and 3in1 at home and that her daughter will assist her at discharge. Patient says she would like therapist by the name of Ludwig Lean to see her, CM gave this request to Christa See.   Expected Discharge Date:  10/21/17               Expected Discharge Plan:  [place]  In-House Referral:  NA  Discharge planning Services  CM Consult  Post Acute Care Choice:  Home Health Choice offered to:  Patient  DME Arranged:  N/A(has RW and 3in1) DME Agency:  NA  HH Arranged:  [place] Agency:  [place]  Status of Service:  Completed, signed off  If discussed at [place] of Stay Meetings, dates discussed:    Additional Comments:  Ninfa Meeker, RN 10/21/2017, 2:37 PM

## 2017-10-22 LAB — URINE CULTURE: Culture: 70000 — AB

## 2017-11-09 ENCOUNTER — Encounter: Payer: Self-pay | Admitting: Cardiology

## 2018-08-11 ENCOUNTER — Ambulatory Visit: Payer: Medicare Other | Admitting: Cardiology

## 2018-08-11 ENCOUNTER — Encounter: Payer: Self-pay | Admitting: Cardiology

## 2018-08-11 VITALS — BP 116/78 | HR 78 | Ht 59.0 in | Wt 123.0 lb

## 2018-08-11 DIAGNOSIS — I2510 Atherosclerotic heart disease of native coronary artery without angina pectoris: Secondary | ICD-10-CM

## 2018-08-11 DIAGNOSIS — I119 Hypertensive heart disease without heart failure: Secondary | ICD-10-CM

## 2018-08-11 DIAGNOSIS — I5022 Chronic systolic (congestive) heart failure: Secondary | ICD-10-CM | POA: Diagnosis not present

## 2018-08-11 DIAGNOSIS — I340 Nonrheumatic mitral (valve) insufficiency: Secondary | ICD-10-CM | POA: Diagnosis not present

## 2018-08-11 MED ORDER — CARVEDILOL 3.125 MG PO TABS: 3.1250 mg | ORAL_TABLET | Freq: Two times a day (BID) | ORAL | 3 refills | Status: DC

## 2018-08-11 MED ORDER — FUROSEMIDE 40 MG PO TABS: 20.0000 mg | ORAL_TABLET | Freq: Every day | ORAL | 3 refills | Status: DC

## 2018-08-11 MED ORDER — SACUBITRIL-VALSARTAN 24-26 MG PO TABS: 1.0000 | ORAL_TABLET | Freq: Two times a day (BID) | ORAL | 3 refills | Status: DC

## 2018-08-11 NOTE — Progress Notes (Signed)
Cardiology Office Note:    Date:  08/11/2018   ID:  KELBI RENSTROM, DOB [birth year], MRN [id number]  PCP:  [name], MD  Cardiologist:  Shirlee More, MD    Referring MD: [name], MD    ASSESSMENT:    1. Chronic systolic heart failure ([place])   2. Hypertensive heart disease without CHF   3. Nonrheumatic mitral valve regurgitation   4. Mild CAD    PLAN:    In order of problems listed above:  1. Her heart failure is stable compensated New York Heart Association class I to class II.  She became hypotensive with MRA and has had hypotensive attempts to increase her dose of Entresto.  She is on a good medical regimen loop diuretic beta-blocker Delene Loll will continue the same but will drop her ideal weight down 1 pound for the second dose of diuretic.  Do not think procedures like ICD or mitral clip would be of benefit.  Closely supervised with the daughter.  Meds check labs including BMP and proBNP level 2. Stable blood pressure target continue current treatment 3. Functional secondary to heart failure 4. Stable asymptomatic continue medical treatment I do not think she requires an ischemia evaluation   Next appointment: 3 months   Medication Adjustments/Labs and Tests Ordered: Current medicines are reviewed at length with the patient today.  Concerns regarding medicines are outlined above.  No orders of the defined types were placed in this encounter.  No orders of the defined types were placed in this encounter.   No chief complaint on file.   History of Present Illness:    MADEEHA COSTANTINO is a 84 y.o. female with a hx of chronic systolic heart failure hypertension and cardiomyopathy.  Coronary angiography January 2017 showed very mild nonobstructive CAD 30% left main ejection fraction 40%.  Her low ejection fraction is documented 50 to 20% by echocardiogram on 05/16/2018 she was last seen 05/16/2018.  Follow-up echocardiogram 05/16/2018 shows ejection fraction  estimated 15% dilated left ventricle moderate LVH severe left atrial enlargement moderate to severe right atrial enlargement moderate mitral and mild tricuspid regurgitation.  Her EKG shows sinus rhythm LVH QRS duration 113 ms. Compliance with diet, lifestyle and medications: Yes  Is daily and intermittently takes a second dose of diuretic if she is 1-5 or greater.  She has a little bit of peripheral edema time to time no shortness of breath orthopnea chest pain palpitation or syncope she uses a pillbox.  She was placed on MRA she had symptomatic hypotension Past Medical History:  Diagnosis Date  . Arthritis   . Cardiomyopathy ([place])   . Congestive heart failure (CHF) ([place])   . GERD (gastroesophageal reflux disease)    currently non problematic   . Hypertension   . 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 Pneumonia    x2 most recent fall of 2017   . PONV (postoperative nausea and vomiting)   . Stroke [facility]) 2008   deficit of neuroplathy in left hand and left foot     Past Surgical History:  Procedure Laterality Date  . ABDOMINAL HYSTERECTOMY    . APPENDECTOMY    . BLADDER SUSPENSION    . CARDIAC CATHETERIZATION  10/2015  . CHOLECYSTECTOMY    . HEMIARTHROPLASTY HIP Right 10/20/2017  . HIP ARTHROPLASTY Right 10/19/2017   Procedure: ARTHROPLASTY BIPOLAR HIP (HEMIARTHROPLASTY);  Surgeon: Paralee Cancel, MD;  Location: [facility];  Service: Orthopedics;  Laterality: Right;  .  JOINT REPLACEMENT Left 2013   left hip  . TONSILLECTOMY    . TOTAL KNEE ARTHROPLASTY Left 05/04/2017   Procedure: LEFT TOTAL KNEE ARTHROPLASTY;  Surgeon: Paralee Cancel, MD;  Location: [facility];  Service: Orthopedics;  Laterality: Left;  70 mins  . TUBAL LIGATION      Current Medications: No outpatient medications have been marked as taking for the 08/11/18 encounter (Appointment) with Richardo Priest, MD.     Allergies:   Doxycycline; Macrobid [nitrofurantoin]; Valsartan; Lisinopril; Calcium channel  blockers; and Penicillins   Social History   Socioeconomic History  . Marital status: Widowed    Spouse name: Not on file  . Number of children: Not on file  . Years of education: Not on file  . Highest education level: Not on file  Occupational History  . Not on file  Social Needs  . Financial resource strain: Not on file  . Food insecurity:    Worry: Not on file    Inability: Not on file  . Transportation needs:    Medical: Not on file    Non-medical: Not on file  Tobacco Use  . Smoking status: Never Smoker  . Smokeless tobacco: Never Used  Substance and Sexual Activity  . Alcohol use: No  . Drug use: No  . Sexual activity: Not on file  Lifestyle  . Physical activity:    Days per week: Not on file    Minutes per session: Not on file  . Stress: Not on file  Relationships  . Social connections:    Talks on phone: Not on file    Gets together: Not on file    Attends religious service: Not on file    Active member of club or organization: Not on file    Attends meetings of clubs or organizations: Not on file    Relationship status: Not on file  Other Topics Concern  . Not on file  Social History Narrative  . Not on file     Family History: The patient's family history is not on file. ROS:   Please see the history of present illness.    All other systems reviewed and are negative.  EKGs/Labs/Other Studies Reviewed:    The following studies were reviewed today:  EKG:  EKG ordered today.  The ekg ordered today demonstrates on his rhythm left anterior hemiblock  Recent Labs: 10/20/2017: Hemoglobin 12.2; Platelets 175 10/21/2017: BUN 14; Creatinine, Ser 0.54; Potassium 3.6; Sodium 139  Recent Lipid Panel No results found for: CHOL, TRIG, HDL, CHOLHDL, VLDL, LDLCALC, LDLDIRECT  Physical Exam:    VS:  There were no vitals taken for this visit.    Wt Readings from Last 3 Encounters:  10/19/17 125 lb (56.7 kg)  05/04/17 131 lb (59.4 kg)  04/28/17 131 lb 1.6  oz (59.5 kg)     GEN: Looks somewhat frail well nourished, well developed in no acute distress HEENT: Normal NECK: No JVD; No carotid bruits LYMPHATICS: No lymphadenopathy CARDIAC: Soft S1 no S3 RRR, no murmurs, rubs, gallops RESPIRATORY:  Clear to auscultation without rales, wheezing or rhonchi  ABDOMEN: Soft, non-tender, non-distended MUSCULOSKELETAL:  1bilateral pretibial edema; No deformity  SKIN: Warm and dry NEUROLOGIC:  Alert and oriented x 3 PSYCHIATRIC:  Normal affect    Signed, Shirlee More, MD  08/11/2018 3:24 PM    [facility]

## 2018-08-11 NOTE — Patient Instructions (Signed)
Medication Instructions:  Your physician has recommended you make the following change in your medication:  CHANGE furosemide (lasix) 40 ng: Take 0.5 mg tablet daily. If your weight is 124 pounds or more, take 1 tablet (40 mg) that day.   If you need a refill on your cardiac medications before your next appointment, please call your pharmacy.   Lab work: Your physician recommends that you return for lab work today: BMP, ProBNP.   If you have labs (blood work) drawn today and your tests are completely normal, you will receive your results only by: Marland Kitchen MyChart Message (if you have MyChart) OR . A paper copy in the mail If you have any lab test that is abnormal or we need to change your treatment, we will call you to review the results.  Testing/Procedures: None  Follow-Up: At [facility], you and your health needs are our priority.  As part of our continuing mission to provide you with exceptional heart care, we have created designated Provider Care Teams.  These Care Teams include your primary Cardiologist (physician) and Advanced Practice Providers (APPs -  Physician Assistants and Nurse Practitioners) who all work together to provide you with the care you need, when you need it. You will need a follow up appointment in 3 months.

## 2018-08-12 LAB — BASIC METABOLIC PANEL
BUN / CREAT RATIO: 19 (ref 12–28)
BUN: 14 mg/dL (ref 8–27)
CHLORIDE: 97 mmol/L (ref 96–106)
CO2: 30 mmol/L — ABNORMAL HIGH (ref 20–29)
Calcium: 9.8 mg/dL (ref 8.7–10.3)
Creatinine, Ser: 0.75 mg/dL (ref 0.57–1.00)
GFR calc non Af Amer: 74 mL/min/{1.73_m2} (ref >59)
GFR, EST AFRICAN AMERICAN: 86 mL/min/{1.73_m2} (ref >59)
Glucose: 99 mg/dL (ref 65–99)
POTASSIUM: 4.1 mmol/L (ref 3.5–5.2)
Sodium: 142 mmol/L (ref 134–144)

## 2018-08-12 LAB — PRO B NATRIURETIC PEPTIDE: NT-Pro BNP: 6251 pg/mL — ABNORMAL HIGH (ref 0–738)

## 2018-11-15 ENCOUNTER — Encounter: Payer: Self-pay | Admitting: Cardiology

## 2018-11-15 ENCOUNTER — Ambulatory Visit: Payer: Medicare Other | Admitting: Cardiology

## 2018-11-15 VITALS — BP 130/82 | HR 92 | Ht 59.0 in | Wt 122.8 lb

## 2018-11-15 DIAGNOSIS — E785 Hyperlipidemia, unspecified: Secondary | ICD-10-CM

## 2018-11-15 DIAGNOSIS — I2510 Atherosclerotic heart disease of native coronary artery without angina pectoris: Secondary | ICD-10-CM

## 2018-11-15 DIAGNOSIS — I119 Hypertensive heart disease without heart failure: Secondary | ICD-10-CM | POA: Diagnosis not present

## 2018-11-15 DIAGNOSIS — I5022 Chronic systolic (congestive) heart failure: Secondary | ICD-10-CM

## 2018-11-15 MED ORDER — SACUBITRIL-VALSARTAN 24-26 MG PO TABS: 1.0000 | ORAL_TABLET | Freq: Two times a day (BID) | ORAL | 3 refills | Status: DC

## 2018-11-15 MED ORDER — CARVEDILOL 3.125 MG PO TABS: 3.1250 mg | ORAL_TABLET | Freq: Two times a day (BID) | ORAL | 3 refills | Status: DC

## 2018-11-15 MED ORDER — FUROSEMIDE 40 MG PO TABS: 20.0000 mg | ORAL_TABLET | Freq: Every day | ORAL | 1 refills | Status: DC

## 2018-11-15 MED ORDER — SPIRONOLACTONE 25 MG PO TABS: ORAL_TABLET | ORAL | 1 refills | Status: DC

## 2018-11-15 NOTE — Progress Notes (Signed)
Cardiology Office Note:    Date:  11/15/2018   ID:  Barbara Kelley, DOB 06/06/1934, MRN [id number]  PCP:  [name], MD  Cardiologist:  Shirlee More, MD    Referring MD: [name], MD    ASSESSMENT:    1. Chronic systolic heart failure ([place])   2. Hypertensive heart disease without CHF   3. Mild CAD   4. Hyperlipidemia, unspecified hyperlipidemia type    PLAN:    In order of problems listed above:  1. Heart failure stable New York Heart Association class I to class II continue her current loop diuretic cautiously add a small amount of MRA along with minimal beta-blocker I would not titrate with blood pressure issues and wheezing and low-dose Entresto.  We will recheck labs including BMP and proBNP level. 2. Stable and able to tolerate higher doses of Entresto 3. Stable having no angina New York Heart Association class I continue medical therapy 4. Stable continue her current fish oil   Next appointment: 6 months   Medication Adjustments/Labs and Tests Ordered: Current medicines are reviewed at length with the patient today.  Concerns regarding medicines are outlined above.  Orders Placed This Encounter  Procedures  . Basic Metabolic Panel (BMET)  . Pro b natriuretic peptide (BNP)   Meds ordered this encounter  Medications  . spironolactone (ALDACTONE) 25 MG tablet    Sig: Take 0.5 tablet (12.5 mg) on Monday, Wednesday, and Friday.    Dispense:  30 tablet    Refill:  1  . sacubitril-valsartan (ENTRESTO) 24-26 MG    Sig: Take 1 tablet by mouth 2 (two) times daily.    Dispense:  60 tablet    Refill:  3  . carvedilol (COREG) 3.125 MG tablet    Sig: Take 1 tablet (3.125 mg total) by mouth 2 (two) times daily with a meal.    Dispense:  60 tablet    Refill:  3  . furosemide (LASIX) 40 MG tablet    Sig: Take 0.5 tablets (20 mg total) by mouth daily.    Dispense:  30 tablet    Refill:  1    Chief Complaint  Patient presents with  . Congestive Heart  Failure    History of Present Illness:    Barbara Kelley is a 85 y.o. female with a hx of chronic systolic heart failure hypertension and cardiomyopathy.  Coronary angiography January 2017 showed very mild nonobstructive CAD 30% left main ejection fraction 40%.  Her low ejection fraction is documented 50 to 20% by echocardiogram on 05/16/2018 she was last seen 05/16/2018.  Follow-up echocardiogram 05/16/2018 shows ejection fraction estimated 15% dilated left ventricle moderate LVH severe left atrial enlargement moderate to severe right atrial enlargement moderate mitral and mild tricuspid regurgitation  She was  last seen 08/11/2018. Compliance with diet, lifestyle and medications: Yes she weighs daily weight is stable at home sodium restriction is compliant with medications family is present and reinforces these findings  Overall she is done well is pleased with the quality of her life she has had a cough and a little wheezing recently.  No shortness of breath orthopnea chest pain palpitation or syncope but she notices mild dependent edema.  We discussed increasing her Delene Loll but the last time the cause symptomatic hypotension.  To optimize medical therapy will put on a low dose of MRA. Past Medical History:  Diagnosis Date  . Arthritis   . Cardiomyopathy ([place])   . Congestive heart failure (CHF) ([place])   .  GERD (gastroesophageal reflux disease)    currently non problematic   . Hypertension   . 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 Pneumonia    x2 most recent fall of 2017   . PONV (postoperative nausea and vomiting)   . Stroke [facility]) 2008   deficit of neuroplathy in left hand and left foot     Past Surgical History:  Procedure Laterality Date  . ABDOMINAL HYSTERECTOMY    . APPENDECTOMY    . BLADDER SUSPENSION    . CARDIAC CATHETERIZATION  10/2015  . CHOLECYSTECTOMY    . HEMIARTHROPLASTY HIP Right 10/20/2017  . HIP ARTHROPLASTY Right 10/19/2017   Procedure:  ARTHROPLASTY BIPOLAR HIP (HEMIARTHROPLASTY);  Surgeon: Paralee Cancel, MD;  Location: [place];  Service: Orthopedics;  Laterality: Right;  . JOINT REPLACEMENT Left 2013   left hip  . TONSILLECTOMY    . TOTAL KNEE ARTHROPLASTY Left 05/04/2017   Procedure: LEFT TOTAL KNEE ARTHROPLASTY;  Surgeon: Paralee Cancel, MD;  Location[facility];  Service: Orthopedics;  Laterality: Left;  70 mins  . TUBAL LIGATION      Current Medications: Current Meds  Medication Sig  . b complex vitamins tablet Take 1 tablet by mouth daily.  . Calcium Carb-Cholecalciferol (CALCIUM-VITAMIN D) 500-200 MG-UNIT tablet Take 1 tablet by mouth daily.  Marland Kitchen CALCIUM-MAGNESIUM-ZINC PO Take 1 capsule by mouth daily.  . carvedilol (COREG) 3.125 MG tablet Take 1 tablet (3.125 mg total) by mouth 2 (two) times daily with a meal.  . Coenzyme Q10 (COQ10) 200 MG CAPS Take 1 capsule by mouth daily.  . furosemide (LASIX) 40 MG tablet Take 0.5 tablets (20 mg total) by mouth daily.  Marland Kitchen GINKGO BILOBA PO Take 1 capsule by mouth 2 (two) times daily.  . Multiple Vitamins-Minerals (MULTIVITAMIN WITH MINERALS) tablet Take 1 tablet by mouth daily.  . Omega-3 Fatty Acids (FISH OIL PO) Take 1 capsule by mouth daily.  . Probiotic Product (PROBIOTIC PO) Take 1 tablet by mouth daily.  . sacubitril-valsartan (ENTRESTO) 24-26 MG Take 1 tablet by mouth 2 (two) times daily.  . [DISCONTINUED] carvedilol (COREG) 3.125 MG tablet Take 1 tablet (3.125 mg total) by mouth 2 (two) times daily with a meal.  . [DISCONTINUED] furosemide (LASIX) 40 MG tablet Take 0.5 tablets (20 mg total) by mouth daily.  . [DISCONTINUED] sacubitril-valsartan (ENTRESTO) 24-26 MG Take 1 tablet by mouth 2 (two) times daily.     Allergies:   Doxycycline; Macrobid [nitrofurantoin]; Valsartan; Lisinopril; Calcium channel blockers; and Penicillins   Social History   Socioeconomic History  . Marital status: Widowed    Spouse name: Not on file  . Number of children: Not on file  . Years of  education: Not on file  . Highest education level: Not on file  Occupational History  . Not on file  Social Needs  . Financial resource strain: Not on file  . Food insecurity:    Worry: Not on file    Inability: Not on file  . Transportation needs:    Medical: Not on file    Non-medical: Not on file  Tobacco Use  . Smoking status: Never Smoker  . Smokeless tobacco: Never Used  Substance and Sexual Activity  . Alcohol use: No  . Drug use: No  . Sexual activity: Not on file  Lifestyle  . Physical activity:    Days per week: Not on file    Minutes per session: Not on file  . Stress:  Not on file  Relationships  . Social connections:    Talks on phone: Not on file    Gets together: Not on file    Attends religious service: Not on file    Active member of club or organization: Not on file    Attends meetings of clubs or organizations: Not on file    Relationship status: Not on file  Other Topics Concern  . Not on file  Social History Narrative  . Not on file     Family History: The patient's family history includes Diabetes in her mother and sister; Lung cancer in her father; Ovarian cancer in her mother; Stroke in her maternal grandfather. ROS:   Please see the history of present illness.    All other systems reviewed and are negative.  EKGs/Labs/Other Studies Reviewed:    The following studies were reviewed today:   Recent Labs: 08/11/2018: BUN 14; Creatinine, Ser 0.75; NT-Pro BNP 6,251; Potassium 4.1; Sodium 142  Recent Lipid Panel No results found for: CHOL, TRIG, HDL, CHOLHDL, VLDL, LDLCALC, LDLDIRECT  Physical Exam:    VS:  BP 130/82 (BP Location: Right Arm, Patient Position: Sitting, Cuff Size: Normal)   Pulse 92   Ht 4\' 11"  (1.499 m)   Wt 122 lb 12 oz (55.7 kg)   SpO2 98%   BMI 24.79 kg/m     Wt Readings from Last 3 Encounters:  11/15/18 122 lb 12 oz (55.7 kg)  08/11/18 123 lb (55.8 kg)  10/19/17 125 lb (56.7 kg)     GEN: She looks frail well  nourished, well developed in no acute distress HEENT: Normal NECK: No JVD; No carotid bruits LYMPHATICS: No lymphadenopathy CARDIAC: RRR, no murmurs, rubs, gallops RESPIRATORY:  Clear to auscultation without rales, wheezing or rhonchi  ABDOMEN: Soft, non-tender, non-distended MUSCULOSKELETAL: Trace edema; No deformity  SKIN: Warm and dry NEUROLOGIC:  Alert and oriented x 3 PSYCHIATRIC:  Normal affect    Signed, Shirlee More, MD  11/15/2018 4:55 PM    [facility]

## 2018-11-15 NOTE — Patient Instructions (Addendum)
Medication Instructions:  Your physician has recommended you make the following change in your medication:   START spironolactone 12.5 mg on Monday, Wednesday and Friday.   If you need a refill on your cardiac medications before your next appointment, please call your pharmacy.   Lab work: Your physician recommends that you return for lab work today: BMP and ProBNP   If you have labs (blood work) drawn today and your tests are completely normal, you will receive your results only by: Marland Kitchen MyChart Message (if you have MyChart) OR . A paper copy in the mail If you have any lab test that is abnormal or we need to change your treatment, we will call you to review the results.  Testing/Procedures: NONE  Follow-Up: At [facility], you and your health needs are our priority.  As part of our continuing mission to provide you with exceptional heart care, we have created designated Provider Care Teams.  These Care Teams include your primary Cardiologist (physician) and Advanced Practice Providers (APPs -  Physician Assistants and Nurse Practitioners) who all work together to provide you with the care you need, when you need it. You will need a follow up appointment in 3 months.  Please call our office 2 months in advance to schedule this appointment.  You may see No primary care provider on file.     Spironolactone tablets What is this medicine? SPIRONOLACTONE (speer on oh LAK tone) is a diuretic. It helps you make more urine and to lose excess water from your body. This medicine is used to treat high blood pressure, and edema or swelling from heart, kidney, or liver disease. It is also used to treat patients who make too much aldosterone or have low potassium. This medicine may be used for other purposes; ask your health care provider or pharmacist if you have questions. COMMON BRAND NAME(S): Aldactone What should I tell my health care provider before I take this medicine? They need to know if  you have any of these conditions: -high blood level of potassium -kidney disease or trouble making urine -liver disease -an unusual or allergic reaction to spironolactone, other medicines, foods, dyes, or preservatives -pregnant or trying to get pregnant -breast-feeding How should I use this medicine? Take this medicine by mouth with a drink of water. Follow the directions on your prescription label. You can take it with or without food. If it upsets your stomach, take it with food. Do not take your medicine more often than directed. Remember that you will need to pass more urine after taking this medicine. Do not take your doses at a time of day that will cause you problems. Do not take at bedtime. Talk to your pediatrician regarding the use of this medicine in children. While this drug may be prescribed for selected conditions, precautions do apply. Overdosage: If you think you have taken too much of this medicine contact a poison control center or emergency room at once. NOTE: This medicine is only for you. Do not share this medicine with others. What if I miss a dose? If you miss a dose, take it as soon as you can. If it is almost time for your next dose, take only that dose. Do not take double or extra doses. What may interact with this medicine? Do not take this medicine with any of the following medications: -cidofovir -eplerenone -tranylcypromine This medicine may also interact with the following medications: -aspirin -certain medicines for blood pressure or heart disease like benazepril,  lisinopril, losartan, valsartan -certain medicines that treat or prevent blood clots like heparin and enoxaparin -cholestyramine -cyclosporine -digoxin -lithium -medicines that relax muscles for surgery -NSAIDs, medicines for pain and inflammation, like ibuprofen or naproxen -other diuretics -potassium supplements -steroid medicines like prednisone or cortisone -trimethoprim This list may  not describe all possible interactions. Give your health care provider a list of all the medicines, herbs, non-prescription drugs, or dietary supplements you use. Also tell them if you smoke, drink alcohol, or use illegal drugs. Some items may interact with your medicine. What should I watch for while using this medicine? Visit your doctor or health care professional for regular checks on your progress. Check your blood pressure as directed. Ask your doctor what your blood pressure should be, and when you should contact them. You may need to be on a special diet while taking this medicine. Ask your doctor. Also, ask how many glasses of fluid you need to drink a day. You must not get dehydrated. This medicine may make you feel confused, dizzy or lightheaded. Drinking alcohol and taking some medicines can make this worse. Do not drive, use machinery, or do anything that needs mental alertness until you know how this medicine affects you. Do not sit or stand up quickly. What side effects may I notice from receiving this medicine? Side effects that you should report to your doctor or health care professional as soon as possible: -allergic reactions such as skin rash or itching, hives, swelling of the lips, mouth, tongue, or throat -black or tarry stools -fast, irregular heartbeat -fever -muscle pain, cramps -numbness, tingling in hands or feet -trouble breathing -trouble passing urine -unusual bleeding -unusually weak or tired Side effects that usually do not require medical attention (report to your doctor or health care professional if they continue or are bothersome): -change in voice or hair growth -confusion -dizzy, drowsy -dry mouth, increased thirst -enlarged or tender breasts -headache -irregular menstrual periods -sexual difficulty, unable to have an erection -stomach upset This list may not describe all possible side effects. Call your doctor for medical advice about side effects.  You may report side effects to FDA at 1-800-FDA-1088. Where should I keep my medicine? Keep out of the reach of children. Store below 25 degrees C (77 degrees F). Throw away any unused medicine after the expiration date. NOTE: This sheet is a summary. It may not cover all possible information. If you have questions about this medicine, talk to your doctor, pharmacist, or health care provider.  2019 Elsevier/Gold Standard (2017-02-05 09:42:28)

## 2018-11-16 LAB — BASIC METABOLIC PANEL
BUN / CREAT RATIO: 25 (ref 12–28)
BUN: 16 mg/dL (ref 8–27)
CHLORIDE: 98 mmol/L (ref 96–106)
CO2: 31 mmol/L — ABNORMAL HIGH (ref 20–29)
Calcium: 9.7 mg/dL (ref 8.7–10.3)
Creatinine, Ser: 0.63 mg/dL (ref 0.57–1.00)
GFR calc non Af Amer: 84 mL/min/{1.73_m2} (ref >59)
GFR, EST AFRICAN AMERICAN: 97 mL/min/{1.73_m2} (ref >59)
Glucose: 118 mg/dL — ABNORMAL HIGH (ref 65–99)
Potassium: 4.3 mmol/L (ref 3.5–5.2)
Sodium: 143 mmol/L (ref 134–144)

## 2018-12-15 ENCOUNTER — Other Ambulatory Visit: Payer: Self-pay

## 2018-12-15 ENCOUNTER — Ambulatory Visit (INDEPENDENT_AMBULATORY_CARE_PROVIDER_SITE_OTHER): Payer: Medicare Other | Admitting: Emergency Medicine

## 2018-12-15 ENCOUNTER — Encounter: Payer: Self-pay | Admitting: Emergency Medicine

## 2018-12-15 ENCOUNTER — Ambulatory Visit (INDEPENDENT_AMBULATORY_CARE_PROVIDER_SITE_OTHER)
Admission: RE | Admit: 2018-12-15 | Discharge: 2018-12-15 | Disposition: A | Discharge status: Home / Self Care | Payer: Medicare Other | Source: Ambulatory Visit | Attending: Emergency Medicine | Admitting: Emergency Medicine

## 2018-12-15 VITALS — BP 124/78 | HR 96 | Ht 59.0 in | Wt 128.0 lb

## 2018-12-15 DIAGNOSIS — R0602 Shortness of breath: Secondary | ICD-10-CM | POA: Diagnosis not present

## 2018-12-15 MED ORDER — FLUTICASONE PROPIONATE 50 MCG/ACT NA SUSP: 2.0000 | Freq: Every day | NASAL | 5 refills | Status: DC

## 2018-12-15 MED ORDER — LORATADINE 10 MG PO TABS: 10.0000 mg | ORAL_TABLET | Freq: Every day | ORAL | 5 refills | Status: DC

## 2018-12-15 MED ORDER — BENZONATATE 100 MG PO CAPS: 100.0000 mg | ORAL_CAPSULE | Freq: Four times a day (QID) | ORAL | 1 refills | Status: DC | PRN

## 2018-12-15 MED ORDER — OMEPRAZOLE 20 MG PO CPDR: 20.0000 mg | DELAYED_RELEASE_CAPSULE | Freq: Every day | ORAL | 5 refills | Status: DC

## 2018-12-15 NOTE — Patient Instructions (Signed)
Chest x-ray today We will perform pulmonary function testing at your next office visit Please start fluticasone nasal spray (Flonase), 2 sprays each nostril once daily until next visit. Please start loratadine (Claritin) 10 mg once daily until next visit. Please start omeprazole 20 mg once daily until next visit.  Take this medication 1 hour around food. Continue to use your Delsym for cough suppression Try using Tessalon Perles 100 mg up to every 6 hours if needed for cough suppression. Try to avoid throat clearing if possible Depending on how your cough response we may decide to do other testing or make other modifications. Please follow with Dr. Lamonte Sakai and about 1 month or next available with full PFT on the same day.

## 2018-12-15 NOTE — Progress Notes (Signed)
   Subjective:    Patient ID: ASHIAH KARPOWICZ, female    DOB: 23-Jan-1934, 85 y.o.   MRN: [id number]  HPI    Review of Systems  Constitutional: Positive for unexpected weight change. Negative for fever.  HENT: Positive for congestion. Negative for dental problem, ear pain, nosebleeds, postnasal drip, rhinorrhea, sinus pressure, sneezing, sore throat and trouble swallowing.   Eyes: Negative for redness and itching.  Respiratory: Positive for cough and shortness of breath. Negative for chest tightness and wheezing.   Cardiovascular: Negative for palpitations and leg swelling.  Gastrointestinal: Negative for nausea and vomiting.  Genitourinary: Negative for dysuria.  Musculoskeletal: Negative for joint swelling.  Skin: Negative for rash.  Neurological: Negative for headaches.  Hematological: Does not bruise/bleed easily.  Psychiatric/Behavioral: Negative for dysphoric mood. The patient is not nervous/anxious.        Objective:   Physical Exam        Assessment & Plan:

## 2018-12-15 NOTE — Progress Notes (Signed)
   Subjective:    Patient ID: Barbara Kelley, female    DOB: 25-Oct-1933, 85 y.o.   MRN: [id number]  HPI Error    Review of Systems     Objective:   Physical Exam        Assessment & Plan:

## 2018-12-16 ENCOUNTER — Telehealth: Payer: Self-pay | Admitting: Cardiology

## 2018-12-16 MED ORDER — TORSEMIDE 20 MG PO TABS: ORAL_TABLET | ORAL | 0 refills | Status: DC

## 2018-12-16 NOTE — Telephone Encounter (Signed)
Patient reports worsening of her left foot/ankle swelling. She has increased her furosemide dose from 20 mg daily to 40 mg daily and sometimes she will take 60 mg daily depending on the severity of swelling and weight gain. Patient does weigh herself daily and states she will gain 2-3 pounds once every other week or so. She has stopped taking spironolactone due to hypotension. Patient denies shortness of breath. Her BP and HR are both within normal limits.   Dr. Bettina Gavia advised for patient to sodium restrict. Educated patient on sodium restriction and she verbalized understanding. Patient will stop taking furosemide and start torsemide 20 mg twice daily for one week then decrease to 20 mg daily. Prescription was sent to [facility] in [place] as patient requested. Per Dr. Bettina Gavia, patient has been scheduled for a follow up appointment on Friday, 12/23/2018, at 3:20 pm in the [facility] office.

## 2018-12-16 NOTE — Telephone Encounter (Signed)
Patient states her left foot and ankle is swelling, she is taking 40mg  furosemide. Should she increase? Please advise.

## 2018-12-21 NOTE — Progress Notes (Deleted)
Cardiology Office Note:    Date:  12/21/2018   ID:  Barbara Kelley, DOB 1934-05-22, MRN [id number]  PCP:  [name], MD  Cardiologist:  Shirlee More, MD    Referring MD: [name], MD    ASSESSMENT:    No diagnosis found. PLAN:    In order of problems listed above:  1. ***   Next appointment: ***   Medication Adjustments/Labs and Tests Ordered: Current medicines are reviewed at length with the patient today.  Concerns regarding medicines are outlined above.  No orders of the defined types were placed in this encounter.  No orders of the defined types were placed in this encounter.   No chief complaint on file.   History of Present Illness:    Barbara Kelley is a 85 y.o. female with a hx of chronic systolic heart failure hypertension and cardiomyopathy.  Coronary angiography January 2017 showed very mild nonobstructive CAD 30% left main ejection fraction 40%.  Her low ejection fraction is documented 50 to 20% by echocardiogram on 05/16/2018 she was last seen 05/16/2018.  Follow-up echocardiogram 05/16/2018 shows ejection fraction estimated 15% dilated left ventricle moderate LVH severe left atrial enlargement moderate to severe right atrial enlargement moderate mitral and mild tricuspid regurgitation   She was  last seen 11/15/18. Compliance with diet, lifestyle and medications: *** Past Medical History:  Diagnosis Date  . Arthritis   . Cardiomyopathy ([place])   . Congestive heart failure (CHF) ([place])   . GERD (gastroesophageal reflux disease)    currently non problematic   . Hypertension   . 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 Pneumonia    x2 most recent fall of 2017   . PONV (postoperative nausea and vomiting)   . Stroke [facility]) 2008   deficit of neuroplathy in left hand and left foot     Past Surgical History:  Procedure Laterality Date  . ABDOMINAL HYSTERECTOMY    . APPENDECTOMY    . BLADDER SUSPENSION    . CARDIAC  CATHETERIZATION  10/2015  . CHOLECYSTECTOMY    . HEMIARTHROPLASTY HIP Right 10/20/2017  . HIP ARTHROPLASTY Right 10/19/2017   Procedure: ARTHROPLASTY BIPOLAR HIP (HEMIARTHROPLASTY);  Surgeon: Paralee Cancel, MD;  Location: [place];  Service: Orthopedics;  Laterality: Right;  . JOINT REPLACEMENT Left 2013   left hip  . TONSILLECTOMY    . TOTAL KNEE ARTHROPLASTY Left 05/04/2017   Procedure: LEFT TOTAL KNEE ARTHROPLASTY;  Surgeon: Paralee Cancel, MD;  [facility];  Service: Orthopedics;  Laterality: Left;  70 mins  . TUBAL LIGATION      Current Medications: No outpatient medications have been marked as taking for the 12/23/18 encounter (Appointment) with Richardo Priest, MD.     Allergies:   Doxycycline; Macrobid [nitrofurantoin]; Valsartan; Lisinopril; Calcium channel blockers; and Penicillins   Social History   Socioeconomic History  . Marital status: Widowed    Spouse name: Not on file  . Number of children: Not on file  . Years of education: Not on file  . Highest education level: Not on file  Occupational History  . Not on file  Social Needs  . Financial resource strain: Not on file  . Food insecurity:    Worry: Not on file    Inability: Not on file  . Transportation needs:    Medical: Not on file    Non-medical: Not on file  Tobacco Use  . Smoking status: Never Smoker  .  Smokeless tobacco: Never Used  Substance and Sexual Activity  . Alcohol use: No  . Drug use: No  . Sexual activity: Not on file  Lifestyle  . Physical activity:    Days per week: Not on file    Minutes per session: Not on file  . Stress: Not on file  Relationships  . Social connections:    Talks on phone: Not on file    Gets together: Not on file    Attends religious service: Not on file    Active member of club or organization: Not on file    Attends meetings of clubs or organizations: Not on file    Relationship status: Not on file  Other Topics Concern  . Not on file  Social History  Narrative  . Not on file     Family History: The patient's ***family history includes Diabetes in her mother and sister; Lung cancer in her father; Ovarian cancer in her mother; Stroke in her maternal grandfather. ROS:   Please see the history of present illness.    All other systems reviewed and are negative.  EKGs/Labs/Other Studies Reviewed:    The following studies were reviewed today:  EKG:  EKG ordered today and personally reviewed.  The ekg ordered today demonstrates ***  Recent Labs: 08/11/2018: NT-Pro BNP 6,251 11/15/2018: BUN 16; Creatinine, Ser 0.63; Potassium 4.3; Sodium 143  Recent Lipid Panel No results found for: CHOL, TRIG, HDL, CHOLHDL, VLDL, LDLCALC, LDLDIRECT  Physical Exam:    VS:  There were no vitals taken for this visit.    Wt Readings from Last 3 Encounters:  12/15/18 128 lb (58.1 kg)  11/15/18 122 lb 12 oz (55.7 kg)  08/11/18 123 lb (55.8 kg)     GEN: *** Well nourished, well developed in no acute distress HEENT: Normal NECK: No JVD; No carotid bruits LYMPHATICS: No lymphadenopathy CARDIAC: ***RRR, no murmurs, rubs, gallops RESPIRATORY:  Clear to auscultation without rales, wheezing or rhonchi  ABDOMEN: Soft, non-tender, non-distended MUSCULOSKELETAL:  No edema; No deformity  SKIN: Warm and dry NEUROLOGIC:  Alert and oriented x 3 PSYCHIATRIC:  Normal affect    Signed, Shirlee More, MD  12/21/2018 11:43 AM    [place]

## 2018-12-23 ENCOUNTER — Ambulatory Visit: Payer: Medicare Other | Admitting: Cardiology

## 2019-01-31 ENCOUNTER — Ambulatory Visit: Payer: Medicare Other | Admitting: Emergency Medicine

## 2019-02-13 ENCOUNTER — Telehealth: Payer: Self-pay | Admitting: Cardiology

## 2019-02-13 NOTE — Telephone Encounter (Signed)
Barbara Kelley is stating Dr. Drema Dallas put her on Torsemide and she is retaining fluid. Can a script for something to get rid of the fluid be called in? Please advise.

## 2019-02-14 MED ORDER — FUROSEMIDE 40 MG PO TABS: 40.0000 mg | ORAL_TABLET | Freq: Two times a day (BID) | ORAL | 0 refills | Status: DC

## 2019-02-14 NOTE — Telephone Encounter (Signed)
Phoned patient, informed that lasix prescription has been called in to [facility] on [address] and she will be taking Lasix 40 mg twice daily. Informed that Dr. Geraldo Pitter would like to schedule a virtual visit with her on Thursday, visit scheduled at 1500, patient has no further questions or concerns at this time

## 2019-02-14 NOTE — Telephone Encounter (Signed)
Patient was started on a trial of torsemide several weeks ago for edema and shortness of breath. She took 20mg  BID for 1 week then decrease to 20 mg daily. She ran out of her prescription 4 days ago and is now calling us for refill. She is short of breath and edema is increasing.   pls advise, tx

## 2019-02-14 NOTE — Telephone Encounter (Signed)
Please give appointment for Thursday and start Lasix 40 mg twice daily but tell the patient that I would like to evaluate this Thursday in more detail

## 2019-02-14 NOTE — Telephone Encounter (Signed)
(  Addendum to 02/14/19 1457 note)..the patient states normal weight is 122 pounds, today's weight 128 pounds. She states her weight gradually crept up after prior prescription dose decreased from twice a day to once a day.

## 2019-02-15 ENCOUNTER — Telehealth: Payer: Self-pay | Admitting: Cardiology

## 2019-02-15 NOTE — Telephone Encounter (Signed)
Virtual Visit Pre-Appointment Phone Call  "(Name), I am calling you today to discuss your upcoming appointment. We are currently trying to limit exposure to the virus that causes COVID-19 by seeing patients at home rather than in the office."  1. "What is the BEST phone number to call the day of the visit?" - include this in appointment notes  2. Do you have or have access to (through a family member/friend) a smartphone with video capability that we can use for your visit?" a. If yes - list this number in appt notes as cell (if different from BEST phone #) and list the appointment type as a VIDEO visit in appointment notes b. If no - list the appointment type as a PHONE visit in appointment notes  3. Confirm consent - "In the setting of the current Covid19 crisis, you are scheduled for a (phone or video) visit with your provider on (date) at (time).  Just as we do with many in-office visits, in order for you to participate in this visit, we must obtain consent.  If you'd like, I can send this to your mychart (if signed up) or email for you to review.  Otherwise, I can obtain your verbal consent now.  All virtual visits are billed to your insurance company just like a normal visit would be.  By agreeing to a virtual visit, we'd like you to understand that the technology does not allow for your provider to perform an examination, and thus may limit your provider's ability to fully assess your condition. If your provider identifies any concerns that need to be evaluated in person, we will make arrangements to do so.  Finally, though the technology is pretty good, we cannot assure that it will always work on either your or our end, and in the setting of a video visit, we may have to convert it to a phone-only visit.  In either situation, we cannot ensure that we have a secure connection.  Are you willing to proceed?" STAFF: Did the patient verbally acknowledge consent to telehealth visit? Document  YES/NO here: YES  4. Advise patient to be prepared - "Two hours prior to your appointment, go ahead and check your blood pressure, pulse, oxygen saturation, and your weight (if you have the equipment to check those) and write them all down. When your visit starts, your provider will ask you for this information. If you have an Apple Watch or Kardia device, please plan to have heart rate information ready on the day of your appointment. Please have a pen and paper handy nearby the day of the visit as well."  5. Give patient instructions for MyChart download to smartphone OR Doximity/Doxy.me as below if video visit (depending on what platform provider is using)  6. Inform patient they will receive a phone call 15 minutes prior to their appointment time (may be from unknown caller ID) so they should be prepared to answer    [facility] has been deemed a candidate for a follow-up tele-health visit to limit community exposure during the Covid-19 pandemic. I spoke with the patient via phone to ensure availability of phone/video source, confirm preferred email & phone number, and discuss instructions and expectations.  I reminded JOLEAN MADARIAGA to be prepared with any vital sign and/or heart rhythm information that could potentially be obtained via home monitoring, at the time of her visit. I reminded CHYLER CREELY to expect a phone call prior to  her visit.  Frederic Jericho 02/15/2019 9:06 AM   INSTRUCTIONS FOR DOWNLOADING THE MYCHART APP TO SMARTPHONE  - The patient must first make sure to have activated MyChart and know their login information - If Apple, go to [employer] and type in MyChart in the search bar and download the app. If Android, ask patient to go to [employer] and type in [place] in the search bar and download the app. The app is free but as with any other app downloads, their phone may require them to verify saved payment information or Apple/Android  password.  - The patient will need to then log into the app with their MyChart username and password, and select [facility] as their healthcare provider to link the account. When it is time for your visit, go to the MyChart app, find appointments, and click Begin Video Visit. Be sure to Select Allow for your device to access the Microphone and Camera for your visit. You will then be connected, and your provider will be with you shortly.  **If they have any issues connecting, or need assistance please contact MyChart service desk [phone])**  **If using a computer, in order to ensure the best quality for their visit they will need to use either of the following Internet Browsers: [employer], or Google Chrome**  IF USING DOXIMITY or DOXY.ME - The patient will receive a link just prior to their visit by text.     FULL LENGTH CONSENT FOR TELE-HEALTH VISIT   I hereby voluntarily request, consent and authorize [facility] and its employed or contracted physicians, physician assistants, nurse practitioners or other licensed health care professionals (the Practitioner), to provide me with telemedicine health care services (the Services") as deemed necessary by the treating Practitioner. I acknowledge and consent to receive the Services by the Practitioner via telemedicine. I understand that the telemedicine visit will involve communicating with the Practitioner through live audiovisual communication technology and the disclosure of certain medical information by electronic transmission. I acknowledge that I have been given the opportunity to request an in-person assessment or other available alternative prior to the telemedicine visit and am voluntarily participating in the telemedicine visit.  I understand that I have the right to withhold or withdraw my consent to the use of telemedicine in the course of my care at any time, without affecting my right to future care or treatment,  and that the Practitioner or I may terminate the telemedicine visit at any time. I understand that I have the right to inspect all information obtained and/or recorded in the course of the telemedicine visit and may receive copies of available information for a reasonable fee.  I understand that some of the potential risks of receiving the Services via telemedicine include:   Delay or interruption in medical evaluation due to technological equipment failure or disruption;  Information transmitted may not be sufficient (e.g. poor resolution of images) to allow for appropriate medical decision making by the Practitioner; and/or   In rare instances, security protocols could fail, causing a breach of personal health information.  Furthermore, I acknowledge that it is my responsibility to provide information about my medical history, conditions and care that is complete and accurate to the best of my ability. I acknowledge that Practitioner's advice, recommendations, and/or decision may be based on factors not within their control, such as incomplete or inaccurate data provided by me or distortions of diagnostic images or specimens that may result from electronic transmissions. I  understand that the practice of medicine is not an exact science and that Practitioner makes no warranties or guarantees regarding treatment outcomes. I acknowledge that I will receive a copy of this consent concurrently upon execution via email to the email address I last provided but may also request a printed copy by calling the office of [facility].    I understand that my insurance will be billed for this visit.   I have read or had this consent read to me.  I understand the contents of this consent, which adequately explains the benefits and risks of the Services being provided via telemedicine.   I have been provided ample opportunity to ask questions regarding this consent and the Services and have had my questions  answered to my satisfaction.  I give my informed consent for the services to be provided through the use of telemedicine in my medical care  By participating in this telemedicine visit I agree to the above.

## 2019-02-16 ENCOUNTER — Telehealth (INDEPENDENT_AMBULATORY_CARE_PROVIDER_SITE_OTHER): Payer: Medicare Other | Admitting: Cardiology

## 2019-02-16 ENCOUNTER — Other Ambulatory Visit: Payer: Self-pay

## 2019-02-16 ENCOUNTER — Encounter: Payer: Self-pay | Admitting: Cardiology

## 2019-02-16 VITALS — BP 119/78 | HR 77 | Ht 59.0 in | Wt 126.0 lb

## 2019-02-16 DIAGNOSIS — Z8673 Personal history of transient ischemic attack (TIA), and cerebral infarction without residual deficits: Secondary | ICD-10-CM

## 2019-02-16 DIAGNOSIS — I428 Other cardiomyopathies: Secondary | ICD-10-CM

## 2019-02-16 DIAGNOSIS — E782 Mixed hyperlipidemia: Secondary | ICD-10-CM

## 2019-02-16 DIAGNOSIS — I340 Nonrheumatic mitral (valve) insufficiency: Secondary | ICD-10-CM

## 2019-02-16 MED ORDER — FUROSEMIDE 40 MG PO TABS: 40.0000 mg | ORAL_TABLET | Freq: Two times a day (BID) | ORAL | 0 refills | Status: DC

## 2019-02-16 NOTE — Patient Instructions (Addendum)
Medication Instructions:  Your physician has recommended you make the following change in your medication:  RESTART: Furosemide 40 mg (1 Tab) TWICE DAILY  If you need a refill on your cardiac medications before your next appointment, please call your pharmacy.   Lab work: Your physician recommends that you return for lab work in:  Get BMP at your primary care doctor at next visit.  If you have labs (blood work) drawn today and your tests are completely normal, you will receive your results only by: Marland Kitchen MyChart Message (if you have MyChart) OR . A paper copy in the mail If you have any lab test that is abnormal or we need to change your treatment, we will call you to review the results.  Testing/Procedures: None  Follow-Up: At [facility], you and your health needs are our priority.  As part of our continuing mission to provide you with exceptional heart care, we have created designated Provider Care Teams.  These Care Teams include your primary Cardiologist (physician) and Advanced Practice Providers (APPs -  Physician Assistants and Nurse Practitioners) who all work together to provide you with the care you need, when you need it. You will need a follow up appointment in 1 months.  Any Other Special Instructions Will Be Listed Below (If Applicable).  WEIGH YOURSELF DAILY AND RECORD.  Heart Failure  Weigh yourself every morning when you first wake up and record on a calender or note pad, bring this to your office visits. Using a pill tender can help with taking your medications consistently.  Limit your fluid intake to 2 liters daily  Limit your sodium intake to less than 2-3 grams daily. Ask if you need dietary teaching.  If you gain more than 3 pounds (from your dry weight ), double your dose of diuretic for the day.  If you gain more than 5 pounds (from your dry weight), double your dose of lasix and call your heart failure doctor.  Please do not smoke tobacco since it is  very bad for your heart.  Please do not drink alcohol since it can worsen your heart failure.Also avoid OTC nonsteroidal drugs, such as advil, aleve and motrin.  Try to exercise for at least 30 minutes every day because this will help your heart be more efficient. You may be eligible for supervised cardiac rehab, ask your physician.

## 2019-02-16 NOTE — Progress Notes (Signed)
Virtual Visit via Telephone Note   This visit type was conducted due to national recommendations for restrictions regarding the COVID-19 Pandemic (e.g. social distancing) in an effort to limit this patient's exposure and mitigate transmission in our community.  Due to her co-morbid illnesses, this patient is at least at moderate risk for complications without adequate follow up.  This format is felt to be most appropriate for this patient at this time.  The patient did not have access to video technology/had technical difficulties with video requiring transitioning to audio format only (telephone).  All issues noted in this document were discussed and addressed.  No physical exam could be performed with this format.  Please refer to the patient's chart for her  consent to telehealth for [facility].   Date:  02/16/2019   ID:  Barbara Kelley, DOB 1934-07-10, MRN [id number]  Patient Location: Home Provider Location: Office  PCP:  [name], MD  Cardiologist:  No primary care provider on file.  Electrophysiologist:  None   Evaluation Performed:  Follow-Up Visit  Chief Complaint: Shortness of breath and congestive heart failure  History of Present Illness:    Barbara Kelley is a 85 y.o. female with past medical history of advanced cardiomyopathy, essential hypertension.  Patient is on diuretic therapy but ran out of it about a week ago and started experiencing increasing weight gain and some shortness of breath.  We initiated her diuretic and she is already feeling better.  She is taking furosemide 40 mg twice a day which is her usual dose.  At the time of my evaluation, the patient is alert awake oriented and in no distress.  The patient does not have symptoms concerning for COVID-19 infection (fever, chills, cough, or new shortness of breath).    Past Medical History:  Diagnosis Date  . Arthritis   . Cardiomyopathy ([place])   . Congestive heart failure (CHF) ([place])   . GERD  (gastroesophageal reflux disease)    currently non problematic   . Hypertension   . 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 Pneumonia    x2 most recent fall of 2017   . PONV (postoperative nausea and vomiting)   . 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 ABDOMINAL HYSTERECTOMY    . APPENDECTOMY    . BLADDER SUSPENSION    . CARDIAC CATHETERIZATION  10/2015  . CHOLECYSTECTOMY    . HEMIARTHROPLASTY HIP Right 10/20/2017  . HIP ARTHROPLASTY Right 10/19/2017   Procedure: ARTHROPLASTY BIPOLAR HIP (HEMIARTHROPLASTY);  Surgeon: Paralee Cancel, MD;  Location: [facility];  Service: Orthopedics;  Laterality: Right;  . JOINT REPLACEMENT Left 2013   left hip  . TONSILLECTOMY    . TOTAL KNEE ARTHROPLASTY Left 05/04/2017   Procedure: LEFT TOTAL KNEE ARTHROPLASTY;  Surgeon: Paralee Cancel, MD;  Location: [facility];  Service: Orthopedics;  Laterality: Left;  70 mins  . TUBAL LIGATION       Current Meds  Medication Sig  . b complex vitamins tablet Take 1 tablet by mouth daily.  . Calcium Carb-Cholecalciferol (CALCIUM-VITAMIN D) 500-200 MG-UNIT tablet Take 1 tablet by mouth daily.  . carvedilol (COREG) 3.125 MG tablet Take 1 tablet (3.125 mg total) by mouth 2 (two) times daily with a meal.  . Coenzyme Q10 (COQ10) 200 MG CAPS Take 1 capsule by mouth daily.  Marland Kitchen  fluticasone (FLONASE) 50 MCG/ACT nasal spray Place 2 sprays into both nostrils daily.  . furosemide (LASIX) 40 MG tablet Take 1 tablet (40 mg total) by mouth 2 (two) times daily.  Marland Kitchen GINKGO BILOBA PO Take 1 capsule by mouth 2 (two) times daily.  Marland Kitchen loratadine (CLARITIN) 10 MG tablet Take 1 tablet (10 mg total) by mouth daily.  . Multiple Vitamins-Minerals (MULTIVITAMIN WITH MINERALS) tablet Take 1 tablet by mouth daily.  . Omega-3 Fatty Acids (FISH OIL PO) Take 1 capsule by mouth daily.  Marland Kitchen omeprazole (PRILOSEC) 20 MG capsule Take 1 capsule (20 mg  total) by mouth daily.  . Probiotic Product (PROBIOTIC PO) Take 1 tablet by mouth daily.  . sacubitril-valsartan (ENTRESTO) 24-26 MG Take 1 tablet by mouth 2 (two) times daily.  . [DISCONTINUED] torsemide (DEMADEX) 20 MG tablet Take 1 tablet (20 mg) twice daily for one week, then take 1 tablet (20 mg) daily.     Allergies:   Doxycycline; Macrobid [nitrofurantoin]; Valsartan; Lisinopril; Calcium channel blockers; and Penicillins   Social History   Tobacco Use  . Smoking status: Never Smoker  . Smokeless tobacco: Never Used  Substance Use Topics  . Alcohol use: No  . Drug use: No     Family Hx: The patient's family history includes Diabetes in her mother and sister; Lung cancer in her father; Ovarian cancer in her mother; Stroke in her maternal grandfather.  ROS:   Please see the history of present illness.    As mentioned above All other systems reviewed and are negative.   Prior CV studies:   The following studies were reviewed today:  Echocardiogram was reviewed and discussed with the patient  Labs/Other Tests and Data Reviewed:    EKG:  No ECG reviewed.  Recent Labs: 08/11/2018: NT-Pro BNP 6,251 11/15/2018: BUN 16; Creatinine, Ser 0.63; Potassium 4.3; Sodium 143   Recent Lipid Panel No results found for: CHOL, TRIG, HDL, CHOLHDL, LDLCALC, LDLDIRECT  Wt Readings from Last 3 Encounters:  02/16/19 126 lb (57.2 kg)  12/15/18 128 lb (58.1 kg)  11/15/18 122 lb 12 oz (55.7 kg)     Objective:    Vital Signs:  BP 119/78 (BP Location: Left Arm, Patient Position: Sitting, Cuff Size: Normal)   Pulse 77   Ht 4\' 11"  (1.499 m)   Wt 126 lb (57.2 kg)   BMI 25.45 kg/m    VITAL SIGNS:  reviewed  ASSESSMENT & PLAN:    1. Congestive heart failure: Patient has advanced cardiomyopathy.  She has missed on a diuretic medications.  We reinitiated this.  Congestive heart failure education was given and salt intake and dietary issues were also discussed.  She was advised to weigh  herself on a regular basis every morning when she wakes up and she is agreeable.  We have initiated her baseline dose of furosemide 40 mg daily.  I told her to go to her primary care physician in 2 weeks for a Chem-7 as she already has an appointment coming up.  She is agreeable.  I told her to be compliant with medications and before she runs out of it to let us know.  She promises to do so in the future. 2. Essential hypertension: Blood pressure is stable she has an appointment with Dr. Bettina Gavia coming up in the month of June and she will keep that appointment.  She will call us if she has any questions or concerns before this time.  COVID-19 Education: The signs and symptoms of COVID-19  were discussed with the patient and how to seek care for testing (follow up with PCP or arrange E-visit).  The importance of social distancing was discussed today.  Time:   Today, I have spent 15 minutes with the patient with telehealth technology discussing the above problems.     Medication Adjustments/Labs and Tests Ordered: Current medicines are reviewed at length with the patient today.  Concerns regarding medicines are outlined above.   Tests Ordered: No orders of the defined types were placed in this encounter.   Medication Changes: No orders of the defined types were placed in this encounter.   Disposition:  Follow up Patient already has an appointment with Dr. Bettina Gavia as mentioned above  Signed, Jenean Lindau, MD  02/16/2019 3:33 PM    [place]

## 2019-02-16 NOTE — Addendum Note (Signed)
Addended by: Particia Nearing B on: 02/16/2019 03:52 PM   Modules accepted: Orders

## 2019-02-21 ENCOUNTER — Ambulatory Visit: Payer: Medicare Other | Admitting: Cardiology

## 2019-03-13 NOTE — Progress Notes (Signed)
Virtual Visit via Telephone Note   This visit type was conducted due to national recommendations for restrictions regarding the COVID-19 Pandemic (e.g. social distancing) in an effort to limit this patient's exposure and mitigate transmission in our community.  Due to her co-morbid illnesses, this patient is at least at moderate risk for complications without adequate follow up.  This format is felt to be most appropriate for this patient at this time.  The patient did not have access to video technology/had technical difficulties with video requiring transitioning to audio format only (telephone).  All issues noted in this document were discussed and addressed.  No physical exam could be performed with this format.  Please refer to the patient's chart for her  consent to telehealth for [facility].   Date:  03/13/2019   ID:  Barbara Kelley, DOB [birth year], MRN [id number]  Patient Location: Home Provider Location: Office  PCP:  [name], MD  Cardiologist:  Shirlee More, MD  Electrophysiologist:  None   Evaluation Performed:  Follow-Up Visit  Chief Complaint:  85yo female presents for 1 month follow up of edema and volume overload.   History of Present Illness:    Barbara Kelley is a 85 y.o. female with chronic systolic heart failure, hypertensive heart disease, cardiomyopathy, mild CAD, HLD last seen by Dr. Lennox Pippins 02/16/19 for decompensated heart failure. At that visit she was initiated on Lasix 40mg  twice daily after running out of medication and missing some doses.   Jan 2017 cath very mild CAD 30% left main; EF 40%. Echo 05/2018 EF 15%, severe LA & RA dilation, moderate LVH, moderate MR, mild TR.   11/15/18 labs: K 4.3, creatinine 0.63, GFR 84  The patient does not have symptoms concerning for COVID-19 infection (fever, chills, cough, or new shortness of breath).   She is not doing well despite increasing diuretic her weight is up 8 pounds.  We will increase the dose of  her diuretic to 3 times daily utilize [facility] check BMP proBNP and if she does not respond to require hospitalization and intravenous diuretic.  I will see her back in my office in 2 weeks face-to-face visit she has edema orthopnea and shortness of breath but she is not short of breath at rest.  She follows a sodium restricted diet and is compliant with medications Past Medical History:  Diagnosis Date  . Arthritis   . Cardiomyopathy ([place])   . Congestive heart failure (CHF) ([place])   . GERD (gastroesophageal reflux disease)    currently non problematic   . Hypertension   . 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 Pneumonia    x2 most recent fall of 2017   . PONV (postoperative nausea and vomiting)   . Stroke [facility]) 2008   deficit of neuroplathy in left hand and left foot    Past Surgical History:  Procedure Laterality Date  . ABDOMINAL HYSTERECTOMY    . APPENDECTOMY    . BLADDER SUSPENSION    . CARDIAC CATHETERIZATION  10/2015  . CHOLECYSTECTOMY    . HEMIARTHROPLASTY HIP Right 10/20/2017  . HIP ARTHROPLASTY Right 10/19/2017   Procedure: ARTHROPLASTY BIPOLAR HIP (HEMIARTHROPLASTY);  Surgeon: Paralee Cancel, MD;  Location: [place];  Service: Orthopedics;  Laterality: Right;  . JOINT REPLACEMENT Left 2013   left hip  . TONSILLECTOMY    . TOTAL KNEE ARTHROPLASTY Left 05/04/2017   Procedure: LEFT TOTAL KNEE ARTHROPLASTY;  Surgeon: Alvan Dame,  Rodman Key, MD;  [facility];  Service: Orthopedics;  Laterality: Left;  70 mins  . TUBAL LIGATION       No outpatient medications have been marked as taking for the 03/14/19 encounter (Appointment) with Richardo Priest, MD.     Allergies:   Doxycycline; Macrobid [nitrofurantoin]; Valsartan; Lisinopril; Calcium channel blockers; and Penicillins   Social History   Tobacco Use  . Smoking status: Never Smoker  . Smokeless tobacco: Never Used  Substance Use Topics  . Alcohol use: No  . Drug use: No     Family Hx: The  patient's family history includes Diabetes in her mother and sister; Lung cancer in her father; Ovarian cancer in her mother; Stroke in her maternal grandfather.  ROS:   Please see the history of present illness.     All other systems reviewed and are negative.   Prior CV studies:   The following studies were reviewed today:  Echo 05/2018:   LV mildly dilated, moderate LVH, severe decrease global wall motion, EF 15%.   LA and RA severely dilated  Trace AR.   Grade 3 MR  Mild TR  Mild to moderate pulmonic regurgitation  Labs/Other Tests and Data Reviewed:     Recent Labs: 08/11/2018: NT-Pro BNP 6,251 11/15/2018: BUN 16; Creatinine, Ser 0.63; Potassium 4.3; Sodium 143   Recent Lipid Panel No results found for: CHOL, TRIG, HDL, CHOLHDL, LDLCALC, LDLDIRECT  Wt Readings from Last 3 Encounters:  02/16/19 126 lb (57.2 kg)  12/15/18 128 lb (58.1 kg)  11/15/18 122 lb 12 oz (55.7 kg)     Objective:    Vital Signs:  There were no vitals taken for this visit.   VITAL SIGNS:  reviewed She sounds fatigued no distress  ASSESSMENT & PLAN:    1. Heart failure is decompensated fluid overload will utilize a very resource increasing diuretic and using [facility] heart failure program I hope she responds otherwise will require admission to the hospital and IV diuretic.  She will continue sodium restriction and will check BMP proBNP level 2. Hypertension stable continue guideline directed therapy beta-blocker Entresto once stabilized we will try to uptitrate 3. Mitral regurgitation secondary functional 4. Mild CAD stable  COVID-19 Education: The signs and symptoms of COVID-19 were discussed with the patient and how to seek care for testing (follow up with PCP or arrange E-visit).  The importance of social distancing was discussed today.  Time:   Today, I have spent 20 minutes with the patient with telehealth technology discussing the above problems.     Medication  Adjustments/Labs and Tests Ordered: Current medicines are reviewed at length with the patient today.  Concerns regarding medicines are outlined above.   Tests Ordered: No orders of the defined types were placed in this encounter.   Medication Changes: No orders of the defined types were placed in this encounter.   Disposition:  Follow up in 2 week(s)  Signed, Shirlee More, MD  03/13/2019 2:00 PM    [place]

## 2019-03-14 ENCOUNTER — Telehealth: Payer: Self-pay | Admitting: Cardiology

## 2019-03-14 ENCOUNTER — Other Ambulatory Visit: Payer: Self-pay | Admitting: Cardiology

## 2019-03-14 ENCOUNTER — Telehealth (INDEPENDENT_AMBULATORY_CARE_PROVIDER_SITE_OTHER): Payer: Medicare Other | Admitting: Cardiology

## 2019-03-14 ENCOUNTER — Encounter: Payer: Self-pay | Admitting: Cardiology

## 2019-03-14 VITALS — BP 145/85 | HR 80 | Ht 59.0 in | Wt 132.0 lb

## 2019-03-14 DIAGNOSIS — I340 Nonrheumatic mitral (valve) insufficiency: Secondary | ICD-10-CM

## 2019-03-14 DIAGNOSIS — I5022 Chronic systolic (congestive) heart failure: Secondary | ICD-10-CM

## 2019-03-14 DIAGNOSIS — I2510 Atherosclerotic heart disease of native coronary artery without angina pectoris: Secondary | ICD-10-CM

## 2019-03-14 DIAGNOSIS — E782 Mixed hyperlipidemia: Secondary | ICD-10-CM

## 2019-03-14 DIAGNOSIS — I428 Other cardiomyopathies: Secondary | ICD-10-CM

## 2019-03-14 DIAGNOSIS — I119 Hypertensive heart disease without heart failure: Secondary | ICD-10-CM

## 2019-03-14 MED ORDER — FUROSEMIDE 40 MG PO TABS: 40.0000 mg | ORAL_TABLET | Freq: Three times a day (TID) | ORAL | 2 refills | Status: DC

## 2019-03-14 MED ORDER — SACUBITRIL-VALSARTAN 24-26 MG PO TABS: 1.0000 | ORAL_TABLET | Freq: Two times a day (BID) | ORAL | 2 refills | Status: DC

## 2019-03-14 NOTE — Patient Instructions (Signed)
Medication Instructions:  Your physician has recommended you make the following change in your medication:   INCREASE: Furosemide 40 mg (1 tab) three times daily   If you need a refill on your cardiac medications before your next appointment, please call your pharmacy.   Lab work: Your physician recommends that you return for lab work in:  [place] MD will check your Probnp and BMP  If you have labs (blood work) drawn today and your tests are completely normal, you will receive your results only by: Marland Kitchen MyChart Message (if you have MyChart) OR . A paper copy in the mail If you have any lab test that is abnormal or we need to change your treatment, we will call you to review the results.  Testing/Procedures: None  Follow-Up: At [facility], you and your health needs are our priority.  As part of our continuing mission to provide you with exceptional heart care, we have created designated Provider Care Teams.  These Care Teams include your primary Cardiologist (physician) and Advanced Practice Providers (APPs -  Physician Assistants and Nurse Practitioners) who all work together to provide you with the care you need, when you need it. You will need a follow up appointment in 2 weeks.  Any Other Special Instructions Will Be Listed Below (If Applicable).   [facility] WILL CONTACT YOU REGARDING SETTING UP HOME APPOINTMENTS TO CHECK THE FLUID LEVELS IN YOUR LUNGS

## 2019-03-14 NOTE — Telephone Encounter (Signed)
[place] Visit Initial Request  Date of Request [facility]):  March 14, 2019  Requesting Provider:  Dr. Shirlee More     Agency Requested:    [facility] Contact:  Joen Laura, [place], [address] [place], [place], [place]  [address] [email or web address]  Phone #: [phone] Fax #: [phone]  Patient Demographic Information: Name:  Barbara Kelley Age:  85 y.o.   DOB:  1933/10/15  MRN:  [id number]   Address:   [address] [place] [address]   Phone Numbers:   Home Phone [phone]  Mobile [phone]     Emergency Contact Information on File:   Contact Information    Name Relation Home Work [place] Daughter [phone]  [phone]      The above family members may be contacted for information on this patient (review DPR on file):  Yes    Patient Clinical Information:  Primary Care Provider:  [name], MD  Primary Cardiologist:  Shirlee More, MD  Primary Electrophysiologist:  None   Past Medical Hx: Barbara Kelley  has a past medical history of Arthritis, Cardiomyopathy ([place]), Congestive heart failure (CHF) ([place]), GERD (gastroesophageal reflux disease), Hypertension, Neuropathy, Pneumonia, PONV (postoperative nausea and vomiting), and Stroke ([place]) (2008).   Allergies: She is allergic to doxycycline; macrobid [nitrofurantoin]; valsartan; lisinopril; calcium channel blockers; and penicillins.   Medications: Current Outpatient Medications on File Prior to Visit  Medication Sig  . b complex vitamins tablet Take 1 tablet by mouth daily.  . Calcium Carb-Cholecalciferol (CALCIUM-VITAMIN D) 500-200 MG-UNIT tablet Take 1 tablet by mouth daily.  . carvedilol (COREG) 3.125 MG tablet Take 1 tablet (3.125 mg total) by mouth 2 (two) times daily with a meal.  . Coenzyme Q10 (COQ10) 200 MG CAPS Take 1 capsule by mouth daily.  . furosemide (LASIX) 40 MG tablet Take 1 tablet (40  mg total) by mouth 2 (two) times daily.  Marland Kitchen GINKGO BILOBA PO Take 1 capsule by mouth 2 (two) times daily.  Marland Kitchen loratadine (CLARITIN) 10 MG tablet Take 1 tablet (10 mg total) by mouth daily.  . Multiple Vitamins-Minerals (MULTIVITAMIN WITH MINERALS) tablet Take 1 tablet by mouth daily.  Marland Kitchen omeprazole (PRILOSEC) 20 MG capsule Take 1 capsule (20 mg total) by mouth daily.  . Probiotic Product (PROBIOTIC PO) Take 1 tablet by mouth daily.  . sacubitril-valsartan (ENTRESTO) 24-26 MG Take 1 tablet by mouth 2 (two) times daily.   No current facility-administered medications on file prior to visit.      Social Hx: She  reports that she has never smoked. She has never used smokeless tobacco. She reports that she does not drink alcohol or use drugs.    Diagnosis/Reason for Visit:   Decompensated heart failure  Services Requested:  Vital Signs (BP, Pulse, O2, Weight)  Physical Exam  ReDS Heart Failure Measurement  # of Visits Needed/Frequency per Week: 6 visits / 1 visit per week  Yes - A copy of the office note will be faxed with this form. N/A - All labs ordered for this home visit have been released and the request was sent to Chrissie Noa at [facility].

## 2019-03-17 NOTE — Telephone Encounter (Signed)
After Dr. Bettina Gavia reviewed [facility] note from the initial home visit, he recommended patient's follow up appointment be moved up. Patient has been rescheduled for an in office visit on Wednesday, 03/22/2019, at 9:40 am in the [facility] office. Patient is agreeable and verbalized understanding. She states she knows where our office is located. No further questions.

## 2019-03-21 NOTE — Progress Notes (Deleted)
Cardiology Office Note:    Date:  03/21/2019   ID:  Barbara Kelley, DOB [birth year], MRN [id number]  PCP:  [name], MD  Cardiologist:  Shirlee More, MD    Referring MD: [name], MD    ASSESSMENT:    No diagnosis found. PLAN:    In order of problems listed above:  1. ***   Next appointment: ***   Medication Adjustments/Labs and Tests Ordered: Current medicines are reviewed at length with the patient today.  Concerns regarding medicines are outlined above.  No orders of the defined types were placed in this encounter.  No orders of the defined types were placed in this encounter.   No chief complaint on file.   History of Present Illness:    Barbara Kelley is a 85 y.o. female with a hx of chronic systolic heart failure hypertension and cardiomyopathy.  Coronary angiography January 2017 showed very mild nonobstructive CAD 30% left main ejection fraction 40%.  Her low ejection fraction is documented 50 to 20% by echocardiogram on 05/16/2018 she was last seen 05/16/2018.  Follow-up echocardiogram 05/16/2018 shows ejection fraction estimated 15% dilated left ventricle moderate LVH severe left atrial enlargement moderate to severe right atrial enlargement moderate mitral and mild tricuspid regurgitation   She wants last seen 1 week ago with markedly decompensated heart failure diuretics were intensified home heart failure program checked the patient and her lung water was 42%.. Compliance with diet, lifestyle and medications: *** Past Medical History:  Diagnosis Date  . Arthritis   . Cardiomyopathy ([place])   . Congestive heart failure (CHF) ([place])   . GERD (gastroesophageal reflux disease)    currently non problematic   . Hypertension   . 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 Pneumonia    x2 most recent fall of 2017   . PONV (postoperative nausea and vomiting)   . Stroke [facility]) 2008   deficit of neuroplathy in left hand and left foot      Past Surgical History:  Procedure Laterality Date  . ABDOMINAL HYSTERECTOMY    . APPENDECTOMY    . BLADDER SUSPENSION    . CARDIAC CATHETERIZATION  10/2015  . CHOLECYSTECTOMY    . HEMIARTHROPLASTY HIP Right 10/20/2017  . HIP ARTHROPLASTY Right 10/19/2017   Procedure: ARTHROPLASTY BIPOLAR HIP (HEMIARTHROPLASTY);  Surgeon: Paralee Cancel, MD;  Location: [place];  Service: Orthopedics;  Laterality: Right;  . JOINT REPLACEMENT Left 2013   left hip  . TONSILLECTOMY    . TOTAL KNEE ARTHROPLASTY Left 05/04/2017   Procedure: LEFT TOTAL KNEE ARTHROPLASTY;  Surgeon: Paralee Cancel, MD;  [facility];  Service: Orthopedics;  Laterality: Left;  70 mins  . TUBAL LIGATION      Current Medications: No outpatient medications have been marked as taking for the 03/22/19 encounter (Appointment) with Richardo Priest, MD.     Allergies:   Doxycycline, Macrobid [nitrofurantoin], Valsartan, Lisinopril, Calcium channel blockers, and Penicillins   Social History   Socioeconomic History  . Marital status: Widowed    Spouse name: Not on file  . Number of children: Not on file  . Years of education: Not on file  . Highest education level: Not on file  Occupational History  . Not on file  Social Needs  . Financial resource strain: Not on file  . Food insecurity    Worry: Not on file    Inability: Not on file  . Transportation needs  Medical: Not on file    Non-medical: Not on file  Tobacco Use  . Smoking status: Never Smoker  . Smokeless tobacco: Never Used  Substance and Sexual Activity  . Alcohol use: No  . Drug use: No  . Sexual activity: Not on file  Lifestyle  . Physical activity    Days per week: Not on file    Minutes per session: Not on file  . Stress: Not on file  Relationships  . Social Herbalist on phone: Not on file    Gets together: Not on file    Attends religious service: Not on file    Active member of club or organization: Not on file    Attends  meetings of clubs or organizations: Not on file    Relationship status: Not on file  Other Topics Concern  . Not on file  Social History Narrative  . Not on file     Family History: The patient's ***family history includes Diabetes in her mother and sister; Lung cancer in her father; Ovarian cancer in her mother; Stroke in her maternal grandfather. ROS:   Please see the history of present illness.    All other systems reviewed and are negative.  EKGs/Labs/Other Studies Reviewed:    The following studies were reviewed today:  EKG:  EKG ordered today and personally reviewed.  The ekg ordered today demonstrates ***  Recent Labs: 08/11/2018: NT-Pro BNP 6,251 11/15/2018: BUN 16; Creatinine, Ser 0.63; Potassium 4.3; Sodium 143  Recent Lipid Panel No results found for: CHOL, TRIG, HDL, CHOLHDL, VLDL, LDLCALC, LDLDIRECT  Physical Exam:    VS:  There were no vitals taken for this visit.    Wt Readings from Last 3 Encounters:  03/14/19 132 lb (59.9 kg)  02/16/19 126 lb (57.2 kg)  12/15/18 128 lb (58.1 kg)     GEN: *** Well nourished, well developed in no acute distress HEENT: Normal NECK: No JVD; No carotid bruits LYMPHATICS: No lymphadenopathy CARDIAC: ***RRR, no murmurs, rubs, gallops RESPIRATORY:  Clear to auscultation without rales, wheezing or rhonchi  ABDOMEN: Soft, non-tender, non-distended MUSCULOSKELETAL:  No edema; No deformity  SKIN: Warm and dry NEUROLOGIC:  Alert and oriented x 3 PSYCHIATRIC:  Normal affect    Signed, Shirlee More, MD  03/21/2019 8:57 AM    [place]

## 2019-03-22 ENCOUNTER — Ambulatory Visit: Payer: Medicare Other | Admitting: Cardiology

## 2019-03-27 ENCOUNTER — Ambulatory Visit: Payer: Medicare Other | Admitting: Cardiology

## 2019-03-28 ENCOUNTER — Ambulatory Visit: Payer: Medicare Other | Admitting: Cardiology

## 2019-04-04 NOTE — Progress Notes (Signed)
Cardiology Office Note:    Date:  04/05/2019   ID:  MADDUX FIRST, DOB Apr 06, 1934, MRN [id number]  PCP:  [name], MD  Cardiologist:  Shirlee More, MD    Referring MD: [name], MD    ASSESSMENT:    1. Chronic systolic heart failure ([place])   2. Hypertensive heart disease with heart failure ([place])   3. NICM (nonischemic cardiomyopathy) ([place])   4. Nonrheumatic mitral valve regurgitation   5. Mild CAD    PLAN:    In order of problems listed above:  1. Chronic systolic heart failure - Continues to be decompensated. LE edema and JVD on exam. Increase Lasix to 80mg  BID. GDMT Entresto, beta blocker, loop diuretic. Unable to up titrate Entresto nor add Spironolactone secondary to previous intolerance.  BMP and ProBNP today. 2. Hypertensive heart disease with heart failure - BP well controlled. Checks at home daily with readings 120s/70s. Continue current anti-hypertensive.  3. NICM - Plan as above. Continue management of HF and HTN. 4. Mitral valve regurgitation - Stable.  5. Mild CAD - Stable. Denies anginal symptoms.   Will ask [facility] to repeat visit. While she is improved from previous visit in regards to weight, still approximately 10lb fluid overloaded based on physical exam. Noted medication intolerances as above to Spironolactone and increased doses Entresto which limites therapeutic options.     Next appointment: 2-3 weeks in [facility] office    Medication Adjustments/Labs and Tests Ordered: Current medicines are reviewed at length with the patient today.  Concerns regarding medicines are outlined above.  Orders Placed This Encounter  Procedures  . EKG 12-Lead   Meds ordered this encounter  Medications  . furosemide (LASIX) 40 MG tablet    Sig: Take 2 tablets (80 mg total) by mouth 2 (two) times daily.    Dispense:  360 tablet    Refill:  3    Chief Complaint  Patient presents with  . Congestive Heart Failure    with recent decompensation  85  yo female presents for follow up of heart failure.   History of Present Illness:    DONIQUE HAMMONDS is a 85 y.o. female with a hx of chronic systolic heart failure, hypertensive heart disease, cardiomyopathy, mild CAD, HLD last seen 03/14/2019. At that time her diuretic was increased and [facility] Failure program was recommended for 6 visits with frequency of once per week.   Jan 2017 cath very mild CAD 30% left main; EF 40%. Echo 05/2018 EF 15%, severe LA and RA dilation, moderate LVH, moderate MR, mild TR  [facility] by [facility] 03/17/19 with Reds Vest resting of 42%, stable vitals, weight 129 lb. Noted irregular rate on auscultation. 2+ LE edema and 1+ upper extremity edema.   She reports feeling well. Endorses she still has some LE edema which is worse at the end of the day. Tries to watch her salt intake, but lives at [facility] who prepare her meals. Reports she elevates her legs when sitting, but "I don't sit for very long".   Reports continued shortness of breath with exertion. Sleeps on one pillow at night in the bed. Reports occasional PND two times per week about the same frequency as previous.   She reports intermittent palpitations, but no dizziness. Tells me she has been by Dr. Wynonia Lawman in the past that she has "early beats" and PVC/PAC are present on her EKG today.   Tells me she has referral to pulmonology  to see Dr. Lamonte Sakai, but this has been delayed due to [place]. Referred for long term wheeze, shortness of breath. States she has chronic cough from heart failure and that while she knows Delene Loll can cause cough she feels better while on the medication.   When discussing medication changes she tells me that spironolactone in the past made her BP too low. Also tells me that increased doses of Entresto caused her heart to beat more irregularly.   Compliance with diet, lifestyle and medications: Yes Past Medical History:  Diagnosis Date  . Arthritis   . Cardiomyopathy  ([place])   . Congestive heart failure (CHF) ([place])   . GERD (gastroesophageal reflux disease)    currently non problematic   . Hypertension   . 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 Pneumonia    x2 most recent fall of 2017   . PONV (postoperative nausea and vomiting)   . Stroke [facility]) 2008   deficit of neuroplathy in left hand and left foot     Past Surgical History:  Procedure Laterality Date  . ABDOMINAL HYSTERECTOMY    . APPENDECTOMY    . BLADDER SUSPENSION    . CARDIAC CATHETERIZATION  10/2015  . CHOLECYSTECTOMY    . HEMIARTHROPLASTY HIP Right 10/20/2017  . HIP ARTHROPLASTY Right 10/19/2017   Procedure: ARTHROPLASTY BIPOLAR HIP (HEMIARTHROPLASTY);  Surgeon: Paralee Cancel, MD;  Location: [facility];  Service: Orthopedics;  Laterality: Right;  . JOINT REPLACEMENT Left 2013   left hip  . TONSILLECTOMY    . TOTAL KNEE ARTHROPLASTY Left 05/04/2017   Procedure: LEFT TOTAL KNEE ARTHROPLASTY;  Surgeon: Paralee Cancel, MD;  Location: [facility];  Service: Orthopedics;  Laterality: Left;  70 mins  . TUBAL LIGATION      Current Medications: Current Meds  Medication Sig  . b complex vitamins tablet Take 1 tablet by mouth daily.  . Calcium Carbonate (CALCIUM 600 PO) Take 3 tablets by mouth daily.  . carvedilol (COREG) 3.125 MG tablet Take 1 tablet (3.125 mg total) by mouth 2 (two) times daily with a meal.  . Cholecalciferol (VITAMIN D HIGH POTENCY) 25 MCG (1000 UT) capsule Take 1,000 Units by mouth daily.  . Coenzyme Q10 (COQ10) 200 MG CAPS Take 1 capsule by mouth daily.  . furosemide (LASIX) 40 MG tablet Take 2 tablets (80 mg total) by mouth 2 (two) times daily.  Marland Kitchen GINKGO BILOBA PO Take 1 capsule by mouth 2 (two) times daily.  . Multiple Vitamins-Minerals (MULTIVITAMIN WITH MINERALS) tablet Take 1 tablet by mouth daily.  Marland Kitchen omeprazole (PRILOSEC) 20 MG capsule Take 1 capsule (20 mg total) by mouth daily.  . Probiotic Product (PROBIOTIC PO) Take 1 tablet by mouth daily.  .  sacubitril-valsartan (ENTRESTO) 24-26 MG Take 1 tablet by mouth 2 (two) times daily.  . [DISCONTINUED] furosemide (LASIX) 40 MG tablet Take 1 tablet (40 mg total) by mouth 3 (three) times daily.     Allergies:   Doxycycline, Macrobid [nitrofurantoin], Valsartan, Lisinopril, Calcium channel blockers, and Penicillins   Social History   Socioeconomic History  . Marital status: Widowed    Spouse name: Not on file  . Number of children: Not on file  . Years of education: Not on file  . Highest education level: Not on file  Occupational History  . Not on file  Social Needs  . Financial resource strain: Not on file  . Food insecurity    Worry: Not on file  Inability: Not on file  . Transportation needs    Medical: Not on file    Non-medical: Not on file  Tobacco Use  . Smoking status: Never Smoker  . Smokeless tobacco: Never Used  Substance and Sexual Activity  . Alcohol use: No  . Drug use: No  . Sexual activity: Not on file  Lifestyle  . Physical activity    Days per week: Not on file    Minutes per session: Not on file  . Stress: Not on file  Relationships  . Social Herbalist on phone: Not on file    Gets together: Not on file    Attends religious service: Not on file    Active member of club or organization: Not on file    Attends meetings of clubs or organizations: Not on file    Relationship status: Not on file  Other Topics Concern  . Not on file  Social History Narrative  . Not on file     Family History: The patient's family history includes Diabetes in her mother and sister; Lung cancer in her father; Ovarian cancer in her mother; Stroke in her maternal grandfather. ROS:   Please see the history of present illness.    All other systems reviewed and are negative.  EKGs/Labs/Other Studies Reviewed:    The following studies were reviewed today: Echo 05/2018:   LV mildly dilated, moderate LVH, severe decrease global wall motion, EF 15%.   LA  and RA severely dilated  Trace AR.   Grade 3 MR  Mild TR  Mild to moderate pulmonic regurgitation    EKG:  EKG ordered today and personally reviewed.  The ekg ordered today demonstrates sinus rhythm with PVC and PAC with conduction delay with QRS of 104 no acute ST/T wave changes.   Recent Labs: 08/11/2018: NT-Pro BNP 6,251 11/15/2018: BUN 16; Creatinine, Ser 0.63; Potassium 4.3; Sodium 143  Recent Lipid Panel No results found for: CHOL, TRIG, HDL, CHOLHDL, VLDL, LDLCALC, LDLDIRECT  Physical Exam:    VS:  BP 134/90 (BP Location: Right Arm, Patient Position: Sitting, Cuff Size: Normal)   Pulse 80   Temp (!) 97.5 F (36.4 C)   Ht 4\' 11"  (1.499 m)   Wt 122 lb 1.9 oz (55.4 kg)   SpO2 95%   BMI 24.67 kg/m     Wt Readings from Last 3 Encounters:  04/05/19 122 lb 1.9 oz (55.4 kg)  03/14/19 132 lb (59.9 kg)  02/16/19 126 lb (57.2 kg)     GEN:  Well nourished, well developed in no acute distress HEENT: Normal NECK: Noted JVD; No carotid bruits LYMPHATICS: No lymphadenopathy CARDIAC: Irregular, no murmurs, rubs, gallops RESPIRATORY:  Clear to auscultation without rales, wheezing or rhonchi  ABDOMEN: Soft, non-tender, non-distended MUSCULOSKELETAL:  1+ bilateral LE edema up to thigh; No deformity  SKIN: Warm and dry NEUROLOGIC:  Alert and oriented x 3 PSYCHIATRIC:  Normal affect    Signed, Shirlee More, MD  04/05/2019 3:27 PM    [facility]

## 2019-04-05 ENCOUNTER — Other Ambulatory Visit: Payer: Self-pay

## 2019-04-05 ENCOUNTER — Encounter: Payer: Self-pay | Admitting: Cardiology

## 2019-04-05 ENCOUNTER — Ambulatory Visit (INDEPENDENT_AMBULATORY_CARE_PROVIDER_SITE_OTHER): Payer: Medicare Other | Admitting: Cardiology

## 2019-04-05 VITALS — BP 134/90 | HR 80 | Temp 97.5°F | Ht 59.0 in | Wt 122.1 lb

## 2019-04-05 DIAGNOSIS — I110 Hypertensive heart disease with heart failure: Secondary | ICD-10-CM

## 2019-04-05 DIAGNOSIS — I340 Nonrheumatic mitral (valve) insufficiency: Secondary | ICD-10-CM

## 2019-04-05 DIAGNOSIS — I2510 Atherosclerotic heart disease of native coronary artery without angina pectoris: Secondary | ICD-10-CM | POA: Diagnosis not present

## 2019-04-05 DIAGNOSIS — I5022 Chronic systolic (congestive) heart failure: Secondary | ICD-10-CM

## 2019-04-05 DIAGNOSIS — I428 Other cardiomyopathies: Secondary | ICD-10-CM | POA: Diagnosis not present

## 2019-04-05 MED ORDER — FUROSEMIDE 40 MG PO TABS: 80.0000 mg | ORAL_TABLET | Freq: Two times a day (BID) | ORAL | 3 refills | Status: DC

## 2019-04-05 NOTE — Patient Instructions (Addendum)
Medication Instructions:  INCREASE Lasix to 80mg  (2 tablets) twice daily.   If you need a refill on your cardiac medications before your next appointment, please call your pharmacy.   Lab work: ProBNP and BMP today.   If you have labs (blood work) drawn today and your tests are completely normal, you will receive your results only by: Marland Kitchen MyChart Message (if you have MyChart) OR . A paper copy in the mail If you have any lab test that is abnormal or we need to change your treatment, we will call you to review the results.  Testing/Procedures: None  Follow-Up: At [facility], you and your health needs are our priority.  As part of our continuing mission to provide you with exceptional heart care, we have created designated Provider Care Teams.  These Care Teams include your primary Cardiologist (physician) and Advanced Practice Providers (APPs -  Physician Assistants and Nurse Practitioners) who all work together to provide you with the care you need, when you need it. You will need a follow up appointment in 2-3 weeks.  You may see Shirlee More, MD or another member of our [facility] Provider Team in [facility]:   Any Other Special Instructions Will Be Listed Below (If Applicable).   Please continue to check blood pressure and weights daily.  Call our office if you notice an increase in lower extremity edema (swelling).  Call our office with new or worsening shortness of breath.   Call our office with weight gains of 5lbs over 2 days.

## 2019-04-06 LAB — BASIC METABOLIC PANEL
BUN/Creatinine Ratio: 18 (ref 12–28)
BUN: 16 mg/dL (ref 8–27)
CO2: 30 mmol/L — ABNORMAL HIGH (ref 20–29)
Calcium: 9.3 mg/dL (ref 8.7–10.3)
Chloride: 98 mmol/L (ref 96–106)
Creatinine, Ser: 0.91 mg/dL (ref 0.57–1.00)
GFR calc Af Amer: 68 mL/min/{1.73_m2} (ref >59)
GFR calc non Af Amer: 59 mL/min/{1.73_m2} — ABNORMAL LOW (ref >59)
Glucose: 142 mg/dL — ABNORMAL HIGH (ref 65–99)
Potassium: 3.8 mmol/L (ref 3.5–5.2)
Sodium: 143 mmol/L (ref 134–144)

## 2019-04-06 LAB — PRO B NATRIURETIC PEPTIDE: NT-Pro BNP: 6871 pg/mL — ABNORMAL HIGH (ref 0–738)

## 2019-04-26 ENCOUNTER — Ambulatory Visit: Payer: Medicare Other | Admitting: Cardiology

## 2019-04-27 ENCOUNTER — Ambulatory Visit: Payer: Medicare Other | Admitting: Emergency Medicine

## 2019-04-28 ENCOUNTER — Ambulatory Visit: Payer: Medicare Other | Admitting: Cardiology

## 2019-05-04 ENCOUNTER — Other Ambulatory Visit: Payer: Self-pay | Admitting: Emergency Medicine

## 2019-05-08 ENCOUNTER — Other Ambulatory Visit: Payer: Self-pay | Admitting: *Deleted

## 2019-05-08 MED ORDER — OMEPRAZOLE 20 MG PO CPDR: 20.0000 mg | DELAYED_RELEASE_CAPSULE | Freq: Every day | ORAL | 5 refills | Status: DC

## 2019-05-16 ENCOUNTER — Ambulatory Visit: Payer: Medicare Other | Admitting: Emergency Medicine

## 2019-08-07 ENCOUNTER — Telehealth: Payer: Self-pay | Admitting: Cardiology

## 2019-08-07 NOTE — Telephone Encounter (Signed)
Wants to discuss meds  Please call patient

## 2019-08-07 NOTE — Telephone Encounter (Signed)
Left message for patient to return call.

## 2019-08-07 NOTE — Telephone Encounter (Signed)
Called patient. She is concerned because she began feeling dizzy 1 week ago and she believes it is from her lasix she was taking 80 mg twice daily, for the past 3-4 days she has decreased her dose to 80 mg in the morning and only 40 mg in the evening, this has helped her dizziness but now her knees and lower legs are swelling. She denies shortness of breath or weight gain. She would like Dr. Joya Gaskins recommendation on what to do next because she is worried going back to her normal dose will cause dizziness. She also wanted to inform us that she lives at [facility] and they have 2 new confirmed cases of COVID 19, therefore if a office visit is preferred she is unsure if she will be able to do this. Will consult with Dr. Bettina Gavia.

## 2019-08-08 NOTE — Telephone Encounter (Signed)
Called patient back this morning to find out how her blood pressure has been running. Patient reports that she has been checking her BP daily and it has been running 120-130's/70's. She is limiting her salt intake in her diet. Encouraged her to elevate her legs as much as possible throughout the day. Patient verbalized understanding.   Will have Dr. Bettina Gavia advise of any further medication changes due to dizziness.

## 2019-08-08 NOTE — Telephone Encounter (Signed)
We will keep her furosemide at 80 mg twice daily

## 2019-08-09 NOTE — Telephone Encounter (Signed)
Patient advised to increase furosemide dose back to 80 mg twice daily per Dr. Bettina Gavia. Patient will continue to monitor her blood pressure, heart rate, and weights daily. She will contact our office with any further concerns of symptoms or weight gain. Patient is agreeable and verbalized understanding. No further questions.

## 2019-10-08 ENCOUNTER — Encounter (HOSPITAL_COMMUNITY): Payer: Self-pay | Admitting: Emergency Medicine

## 2019-10-08 ENCOUNTER — Emergency Department (HOSPITAL_COMMUNITY): Payer: Medicare PPO

## 2019-10-08 ENCOUNTER — Other Ambulatory Visit: Payer: Self-pay

## 2019-10-08 ENCOUNTER — Inpatient Hospital Stay (HOSPITAL_COMMUNITY)
Admission: EM | Admit: 2019-10-08 | Discharge: 2019-10-16 | DRG: 291 | Disposition: A | Discharge status: Skilled Nursing Facility | Payer: Medicare PPO | Attending: Cardiology | Admitting: Cardiology

## 2019-10-08 DIAGNOSIS — Z9049 Acquired absence of other specified parts of digestive tract: Secondary | ICD-10-CM

## 2019-10-08 DIAGNOSIS — J9602 Acute respiratory failure with hypercapnia: Secondary | ICD-10-CM | POA: Diagnosis present

## 2019-10-08 DIAGNOSIS — I272 Pulmonary hypertension, unspecified: Secondary | ICD-10-CM | POA: Diagnosis present

## 2019-10-08 DIAGNOSIS — I5022 Chronic systolic (congestive) heart failure: Secondary | ICD-10-CM

## 2019-10-08 DIAGNOSIS — Z9889 Other specified postprocedural states: Secondary | ICD-10-CM

## 2019-10-08 DIAGNOSIS — I472 Ventricular tachycardia: Secondary | ICD-10-CM | POA: Diagnosis not present

## 2019-10-08 DIAGNOSIS — Z20822 Contact with and (suspected) exposure to covid-19: Secondary | ICD-10-CM | POA: Diagnosis present

## 2019-10-08 DIAGNOSIS — E873 Alkalosis: Secondary | ICD-10-CM | POA: Diagnosis not present

## 2019-10-08 DIAGNOSIS — I428 Other cardiomyopathies: Secondary | ICD-10-CM | POA: Diagnosis present

## 2019-10-08 DIAGNOSIS — I509 Heart failure, unspecified: Secondary | ICD-10-CM

## 2019-10-08 DIAGNOSIS — R531 Weakness: Secondary | ICD-10-CM | POA: Diagnosis not present

## 2019-10-08 DIAGNOSIS — R0602 Shortness of breath: Secondary | ICD-10-CM

## 2019-10-08 DIAGNOSIS — I5043 Acute on chronic combined systolic (congestive) and diastolic (congestive) heart failure: Secondary | ICD-10-CM | POA: Diagnosis present

## 2019-10-08 DIAGNOSIS — Z96641 Presence of right artificial hip joint: Secondary | ICD-10-CM | POA: Diagnosis present

## 2019-10-08 DIAGNOSIS — E876 Hypokalemia: Secondary | ICD-10-CM | POA: Diagnosis not present

## 2019-10-08 DIAGNOSIS — J9601 Acute respiratory failure with hypoxia: Secondary | ICD-10-CM | POA: Diagnosis present

## 2019-10-08 DIAGNOSIS — Z79899 Other long term (current) drug therapy: Secondary | ICD-10-CM

## 2019-10-08 DIAGNOSIS — J918 Pleural effusion in other conditions classified elsewhere: Secondary | ICD-10-CM | POA: Diagnosis present

## 2019-10-08 DIAGNOSIS — E785 Hyperlipidemia, unspecified: Secondary | ICD-10-CM | POA: Diagnosis present

## 2019-10-08 DIAGNOSIS — R64 Cachexia: Secondary | ICD-10-CM | POA: Diagnosis present

## 2019-10-08 DIAGNOSIS — I110 Hypertensive heart disease with heart failure: Principal | ICD-10-CM | POA: Diagnosis present

## 2019-10-08 DIAGNOSIS — N390 Urinary tract infection, site not specified: Secondary | ICD-10-CM | POA: Diagnosis not present

## 2019-10-08 DIAGNOSIS — Z90710 Acquired absence of both cervix and uterus: Secondary | ICD-10-CM

## 2019-10-08 DIAGNOSIS — R627 Adult failure to thrive: Secondary | ICD-10-CM | POA: Diagnosis present

## 2019-10-08 DIAGNOSIS — R0989 Other specified symptoms and signs involving the circulatory and respiratory systems: Secondary | ICD-10-CM

## 2019-10-08 DIAGNOSIS — Z681 Body mass index (BMI) 19 or less, adult: Secondary | ICD-10-CM

## 2019-10-08 DIAGNOSIS — I2510 Atherosclerotic heart disease of native coronary artery without angina pectoris: Secondary | ICD-10-CM | POA: Diagnosis present

## 2019-10-08 DIAGNOSIS — Z96652 Presence of left artificial knee joint: Secondary | ICD-10-CM | POA: Diagnosis present

## 2019-10-08 DIAGNOSIS — K219 Gastro-esophageal reflux disease without esophagitis: Secondary | ICD-10-CM | POA: Diagnosis present

## 2019-10-08 HISTORY — DX: Dyspnea, unspecified: R06.00

## 2019-10-08 LAB — CBC
HCT: 47.6 % — ABNORMAL HIGH (ref 36.0–46.0)
Hemoglobin: 15.3 g/dL — ABNORMAL HIGH (ref 12.0–15.0)
MCH: 29.5 pg (ref 26.0–34.0)
MCHC: 32.1 g/dL (ref 30.0–36.0)
MCV: 91.9 fL (ref 80.0–100.0)
Platelets: 149 10*3/uL — ABNORMAL LOW (ref 150–400)
RBC: 5.18 MIL/uL — ABNORMAL HIGH (ref 3.87–5.11)
RDW: 16.1 % — ABNORMAL HIGH (ref 11.5–15.5)
WBC: 8.3 10*3/uL (ref 4.0–10.5)
nRBC: 0 % (ref 0.0–0.2)

## 2019-10-08 LAB — BASIC METABOLIC PANEL
Anion gap: 12 (ref 5–15)
BUN: 26 mg/dL — ABNORMAL HIGH (ref 8–23)
CO2: 30 mmol/L (ref 22–32)
Calcium: 9.3 mg/dL (ref 8.9–10.3)
Chloride: 100 mmol/L (ref 98–111)
Creatinine, Ser: 1.04 mg/dL — ABNORMAL HIGH (ref 0.44–1.00)
GFR calc Af Amer: 58 mL/min — ABNORMAL LOW (ref >60)
GFR calc non Af Amer: 50 mL/min — ABNORMAL LOW (ref >60)
Glucose, Bld: 172 mg/dL — ABNORMAL HIGH (ref 70–99)
Potassium: 3.5 mmol/L (ref 3.5–5.1)
Sodium: 142 mmol/L (ref 135–145)

## 2019-10-08 LAB — BRAIN NATRIURETIC PEPTIDE: B Natriuretic Peptide: >4500 pg/mL — ABNORMAL HIGH (ref 0.0–100.0)

## 2019-10-08 MED ORDER — SODIUM CHLORIDE 0.9% FLUSH: 3.0000 mL | Freq: Once | INTRAVENOUS | Status: DC

## 2019-10-08 NOTE — ED Triage Notes (Signed)
C/o generalized weakness since yesterday and SOB.

## 2019-10-09 ENCOUNTER — Inpatient Hospital Stay (HOSPITAL_COMMUNITY): Payer: Medicare PPO

## 2019-10-09 DIAGNOSIS — I110 Hypertensive heart disease with heart failure: Secondary | ICD-10-CM | POA: Diagnosis present

## 2019-10-09 DIAGNOSIS — J9602 Acute respiratory failure with hypercapnia: Secondary | ICD-10-CM | POA: Diagnosis present

## 2019-10-09 DIAGNOSIS — R531 Weakness: Secondary | ICD-10-CM | POA: Diagnosis present

## 2019-10-09 DIAGNOSIS — E785 Hyperlipidemia, unspecified: Secondary | ICD-10-CM | POA: Diagnosis present

## 2019-10-09 DIAGNOSIS — Z79899 Other long term (current) drug therapy: Secondary | ICD-10-CM | POA: Diagnosis not present

## 2019-10-09 DIAGNOSIS — E873 Alkalosis: Secondary | ICD-10-CM | POA: Diagnosis not present

## 2019-10-09 DIAGNOSIS — I340 Nonrheumatic mitral (valve) insufficiency: Secondary | ICD-10-CM

## 2019-10-09 DIAGNOSIS — I361 Nonrheumatic tricuspid (valve) insufficiency: Secondary | ICD-10-CM

## 2019-10-09 DIAGNOSIS — I428 Other cardiomyopathies: Secondary | ICD-10-CM | POA: Diagnosis present

## 2019-10-09 DIAGNOSIS — Z90710 Acquired absence of both cervix and uterus: Secondary | ICD-10-CM | POA: Diagnosis not present

## 2019-10-09 DIAGNOSIS — I272 Pulmonary hypertension, unspecified: Secondary | ICD-10-CM | POA: Diagnosis present

## 2019-10-09 DIAGNOSIS — J9601 Acute respiratory failure with hypoxia: Secondary | ICD-10-CM | POA: Diagnosis present

## 2019-10-09 DIAGNOSIS — I5043 Acute on chronic combined systolic (congestive) and diastolic (congestive) heart failure: Secondary | ICD-10-CM | POA: Diagnosis present

## 2019-10-09 DIAGNOSIS — I5023 Acute on chronic systolic (congestive) heart failure: Secondary | ICD-10-CM

## 2019-10-09 DIAGNOSIS — I509 Heart failure, unspecified: Secondary | ICD-10-CM

## 2019-10-09 DIAGNOSIS — N390 Urinary tract infection, site not specified: Secondary | ICD-10-CM | POA: Diagnosis not present

## 2019-10-09 DIAGNOSIS — Z96652 Presence of left artificial knee joint: Secondary | ICD-10-CM | POA: Diagnosis present

## 2019-10-09 DIAGNOSIS — R64 Cachexia: Secondary | ICD-10-CM | POA: Diagnosis present

## 2019-10-09 DIAGNOSIS — Z9889 Other specified postprocedural states: Secondary | ICD-10-CM | POA: Diagnosis not present

## 2019-10-09 DIAGNOSIS — Z9049 Acquired absence of other specified parts of digestive tract: Secondary | ICD-10-CM | POA: Diagnosis not present

## 2019-10-09 DIAGNOSIS — I472 Ventricular tachycardia: Secondary | ICD-10-CM | POA: Diagnosis not present

## 2019-10-09 DIAGNOSIS — K219 Gastro-esophageal reflux disease without esophagitis: Secondary | ICD-10-CM | POA: Diagnosis present

## 2019-10-09 DIAGNOSIS — J90 Pleural effusion, not elsewhere classified: Secondary | ICD-10-CM | POA: Diagnosis not present

## 2019-10-09 DIAGNOSIS — J918 Pleural effusion in other conditions classified elsewhere: Secondary | ICD-10-CM | POA: Diagnosis present

## 2019-10-09 DIAGNOSIS — I10 Essential (primary) hypertension: Secondary | ICD-10-CM

## 2019-10-09 DIAGNOSIS — E876 Hypokalemia: Secondary | ICD-10-CM | POA: Diagnosis not present

## 2019-10-09 DIAGNOSIS — R627 Adult failure to thrive: Secondary | ICD-10-CM | POA: Diagnosis present

## 2019-10-09 DIAGNOSIS — Z20822 Contact with and (suspected) exposure to covid-19: Secondary | ICD-10-CM | POA: Diagnosis present

## 2019-10-09 DIAGNOSIS — Z681 Body mass index (BMI) 19 or less, adult: Secondary | ICD-10-CM | POA: Diagnosis not present

## 2019-10-09 DIAGNOSIS — I2510 Atherosclerotic heart disease of native coronary artery without angina pectoris: Secondary | ICD-10-CM | POA: Diagnosis present

## 2019-10-09 DIAGNOSIS — Z96641 Presence of right artificial hip joint: Secondary | ICD-10-CM | POA: Diagnosis present

## 2019-10-09 LAB — COMPREHENSIVE METABOLIC PANEL
ALT: 16 U/L (ref 0–44)
AST: 65 U/L — ABNORMAL HIGH (ref 15–41)
Albumin: 3.5 g/dL (ref 3.5–5.0)
Alkaline Phosphatase: 65 U/L (ref 38–126)
Anion gap: 14 (ref 5–15)
BUN: 29 mg/dL — ABNORMAL HIGH (ref 8–23)
CO2: 28 mmol/L (ref 22–32)
Calcium: 8.9 mg/dL (ref 8.9–10.3)
Chloride: 99 mmol/L (ref 98–111)
Creatinine, Ser: 1.01 mg/dL — ABNORMAL HIGH (ref 0.44–1.00)
GFR calc Af Amer: 60 mL/min — ABNORMAL LOW (ref >60)
GFR calc non Af Amer: 51 mL/min — ABNORMAL LOW (ref >60)
Glucose, Bld: 154 mg/dL — ABNORMAL HIGH (ref 70–99)
Potassium: 5 mmol/L (ref 3.5–5.1)
Sodium: 141 mmol/L (ref 135–145)
Total Bilirubin: 1.6 mg/dL — ABNORMAL HIGH (ref 0.3–1.2)
Total Protein: 6.1 g/dL — ABNORMAL LOW (ref 6.5–8.1)

## 2019-10-09 LAB — CBC
HCT: 44.1 % (ref 36.0–46.0)
HCT: 46.9 % — ABNORMAL HIGH (ref 36.0–46.0)
Hemoglobin: 14.1 g/dL (ref 12.0–15.0)
Hemoglobin: 14.6 g/dL (ref 12.0–15.0)
MCH: 28.8 pg (ref 26.0–34.0)
MCH: 28.5 pg (ref 26.0–34.0)
MCHC: 32 g/dL (ref 30.0–36.0)
MCHC: 31.1 g/dL (ref 30.0–36.0)
MCV: 90 fL (ref 80.0–100.0)
MCV: 91.4 fL (ref 80.0–100.0)
Platelets: 166 10*3/uL (ref 150–400)
Platelets: 169 10*3/uL (ref 150–400)
RBC: 4.9 MIL/uL (ref 3.87–5.11)
RBC: 5.13 MIL/uL — ABNORMAL HIGH (ref 3.87–5.11)
RDW: 15.9 % — ABNORMAL HIGH (ref 11.5–15.5)
RDW: 16.1 % — ABNORMAL HIGH (ref 11.5–15.5)
WBC: 7.6 10*3/uL (ref 4.0–10.5)
WBC: 9 10*3/uL (ref 4.0–10.5)
nRBC: 0 % (ref 0.0–0.2)
nRBC: 0 % (ref 0.0–0.2)

## 2019-10-09 LAB — CREATININE, SERUM
Creatinine, Ser: 0.82 mg/dL (ref 0.44–1.00)
GFR calc Af Amer: >60 mL/min (ref >60)
GFR calc non Af Amer: >60 mL/min (ref >60)

## 2019-10-09 LAB — SARS CORONAVIRUS 2 (TAT 6-24 HRS): SARS Coronavirus 2: NEGATIVE

## 2019-10-09 LAB — ECHOCARDIOGRAM COMPLETE

## 2019-10-09 LAB — TSH: TSH: 1.725 u[IU]/mL (ref 0.350–4.500)

## 2019-10-09 LAB — MAGNESIUM: Magnesium: 2.2 mg/dL (ref 1.7–2.4)

## 2019-10-09 LAB — LACTIC ACID, PLASMA: Lactic Acid, Venous: 1.2 mmol/L (ref 0.5–1.9)

## 2019-10-09 MED ORDER — CARVEDILOL 3.125 MG PO TABS
3.1250 mg | ORAL_TABLET | Freq: Two times a day (BID) | ORAL | Status: DC
  Administered 2019-10-10 – 2019-10-11 (×3): 3.125 mg via ORAL
  Filled 2019-10-09 (×5): qty 1

## 2019-10-09 MED ORDER — NITROGLYCERIN IN D5W 200-5 MCG/ML-% IV SOLN: 0.0000 ug/min | INTRAVENOUS | Status: DC

## 2019-10-09 MED ORDER — SPIRONOLACTONE 12.5 MG HALF TABLET
12.5000 mg | ORAL_TABLET | Freq: Every day | ORAL | Status: DC
  Administered 2019-10-09 – 2019-10-16 (×8): 12.5 mg via ORAL
  Filled 2019-10-09 (×9): qty 1

## 2019-10-09 MED ORDER — FUROSEMIDE 10 MG/ML IJ SOLN
80.0000 mg | Freq: Two times a day (BID) | INTRAMUSCULAR | Status: DC
  Administered 2019-10-09 – 2019-10-11 (×4): 80 mg via INTRAVENOUS
  Filled 2019-10-09 (×4): qty 8

## 2019-10-09 MED ORDER — FUROSEMIDE 10 MG/ML IJ SOLN
80.0000 mg | Freq: Once | INTRAMUSCULAR | Status: AC
  Administered 2019-10-09: 80 mg via INTRAVENOUS
  Filled 2019-10-09: qty 8

## 2019-10-09 MED ORDER — NITROGLYCERIN 0.4 MG SL SUBL: 0.4000 mg | SUBLINGUAL_TABLET | SUBLINGUAL | Status: DC | PRN

## 2019-10-09 MED ORDER — SACUBITRIL-VALSARTAN 24-26 MG PO TABS
1.0000 | ORAL_TABLET | Freq: Two times a day (BID) | ORAL | Status: DC
  Administered 2019-10-09 – 2019-10-13 (×8): 1 via ORAL
  Filled 2019-10-09 (×14): qty 1

## 2019-10-09 MED ORDER — SODIUM CHLORIDE 0.9% FLUSH: 3.0000 mL | INTRAVENOUS | Status: DC | PRN

## 2019-10-09 MED ORDER — PANTOPRAZOLE SODIUM 40 MG PO TBEC
40.0000 mg | DELAYED_RELEASE_TABLET | Freq: Every day | ORAL | Status: DC
  Administered 2019-10-09 – 2019-10-16 (×8): 40 mg via ORAL
  Filled 2019-10-09 (×8): qty 1

## 2019-10-09 MED ORDER — ONDANSETRON HCL 4 MG/2ML IJ SOLN: 4.0000 mg | Freq: Four times a day (QID) | INTRAMUSCULAR | Status: DC | PRN

## 2019-10-09 MED ORDER — ENOXAPARIN SODIUM 40 MG/0.4ML ~~LOC~~ SOLN
40.0000 mg | SUBCUTANEOUS | Status: DC
  Administered 2019-10-09 – 2019-10-16 (×7): 40 mg via SUBCUTANEOUS
  Filled 2019-10-09 (×7): qty 0.4

## 2019-10-09 MED ORDER — SODIUM CHLORIDE 0.9% FLUSH
3.0000 mL | Freq: Two times a day (BID) | INTRAVENOUS | Status: DC
  Administered 2019-10-09 – 2019-10-16 (×13): 3 mL via INTRAVENOUS

## 2019-10-09 MED ORDER — SODIUM CHLORIDE 0.9 % IV SOLN
250.0000 mL | INTRAVENOUS | Status: DC | PRN
  Administered 2019-10-12: 250 mL via INTRAVENOUS

## 2019-10-09 MED ORDER — ACETAMINOPHEN 325 MG PO TABS
650.0000 mg | ORAL_TABLET | ORAL | Status: DC | PRN
  Administered 2019-10-13 – 2019-10-14 (×2): 650 mg via ORAL
  Filled 2019-10-09 (×2): qty 2

## 2019-10-09 MED ORDER — NITROGLYCERIN IN D5W 200-5 MCG/ML-% IV SOLN
0.0000 ug/min | INTRAVENOUS | Status: DC
  Administered 2019-10-09: 5 ug/min via INTRAVENOUS
  Filled 2019-10-09: qty 250

## 2019-10-09 MED ORDER — FUROSEMIDE 10 MG/ML IJ SOLN: 80.0000 mg | Freq: Once | INTRAMUSCULAR | Status: DC

## 2019-10-09 NOTE — H&P (Addendum)
Cardiology Admission History and Physical:   Patient ID: [name] MRN: [id number]; DOB: 07/05/1934   Admission date: 10/08/2019  Primary Care Provider: [name], MD Primary Cardiologist: Shirlee More, MD  Primary Electrophysiologist:  None   Chief Complaint:  Weakness and acute heart failure  Patient Profile:   Barbara Kelley is a 86 y.o. female with Barbara Kelley is a 86 y.o. female with a hx of chronic systolic heart failure, hypertensive heart disease/NICM, non-rheumatic Mitral Valve Regurgitation, mild CAD who is being seen today for the evaluation of Acute CHF at the request of Dr. Ashok Cordia.  History of Present Illness:   Barbara Kelley  is followed by Dr. Bettina Gavia. She had a cath in  (not in Epic) which showed mild CAD 30% left main, EF 40%.  Echo in 10/2017 showed EF 20-25% with G2DD, trivial TR, mild MR, LA and RA mod dilated. Follow-up 2D echo was performed which showed EF 15%, dilated LV, mod LVH, severe LAE, mod RAE, mod MR, mild TR.  The patient was seen 04/05/2019 by Dr. Bettina Gavia for decompensated heart failure. For the last couple months the patient was noticing lower leg edema and orthopnea on Lasix 40 mg BID. Lasx was increased to 80 mg BID.   The patient presented to the ER 10/08/2019 for weakness and acute CH. Patient has chronic SOB at baseline but 2 days ago it seemed to get worse. She has been unable to walk around the house without feeling severe shortness of breath. She is also experiencing orthopnea and is waking up with trouble breathing. Lower leg edema seems to be worsening. She doesn't elevate her legs or use compression stockings. Her baseline weight is around 115lbs and it has recently been 122 lbs.   In the ED BP 154/98, pulse 92, afebrile, RR 26, 90% O2. Labs showed potassium 3.5, glucose 172, creatinine 1.04, BUN 26, WBC 8.3. Hgb 15.3. BNP >4,500. COVID pending. CXR showed moderate right pleural effusion with adjacent opacities possible atelectasis or  infiltrate. EKG showed NSR, 90 bpm, PVC, and nonspecific T wave changes inferior leads, extreme axis deviation. The patient was unable to take her home medocations while in the ED so pressures went up and she was started on IV NTG. Cardiology was consulted for admission.   Patient denies recent illness, fever, chills. She lives in an independent facility and is able to take care of herself. She uses a walker for mobility. Denis//tobacco/drug/alchol use.   Heart Pathway Score:     Past Medical History:  Diagnosis Date  . Arthritis   . Cardiomyopathy ([place])   . Congestive heart failure (CHF) ([place])   . GERD (gastroesophageal reflux disease)    currently non problematic   . Hypertension   . 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 Pneumonia    x2 most recent fall of 2017   . PONV (postoperative nausea and vomiting)   . Stroke [facility]) 2008   deficit of neuroplathy in left hand and left foot     Past Surgical History:  Procedure Laterality Date  . ABDOMINAL HYSTERECTOMY    . APPENDECTOMY    . BLADDER SUSPENSION    . CARDIAC CATHETERIZATION  10/2015  . CHOLECYSTECTOMY    . HEMIARTHROPLASTY HIP Right 10/20/2017  . HIP ARTHROPLASTY Right 10/19/2017   Procedure: ARTHROPLASTY BIPOLAR HIP (HEMIARTHROPLASTY);  Surgeon: Paralee Cancel, MD;  Location: [place];  Service: Orthopedics;  Laterality: Right;  . JOINT REPLACEMENT Left  2013   left hip  . TONSILLECTOMY    . TOTAL KNEE ARTHROPLASTY Left 05/04/2017   Procedure: LEFT TOTAL KNEE ARTHROPLASTY;  Surgeon: Paralee Cancel, MD;  Location: [facility];  Service: Orthopedics;  Laterality: Left;  70 mins  . TUBAL LIGATION       Medications Prior to Admission: Prior to Admission medications   Medication Sig Start Date End Date Taking? Authorizing Provider  b complex vitamins tablet Take 1 tablet by mouth daily.    [provider]  Calcium Carbonate (CALCIUM 600 PO) Take 3 tablets by mouth daily.    [provider]    carvedilol (COREG) 3.125 MG tablet Take 1 tablet (3.125 mg total) by mouth 2 (two) times daily with a meal. 11/15/18   Munley, Hilton Cork, MD  Cholecalciferol (VITAMIN D HIGH POTENCY) 25 MCG (1000 UT) capsule Take 1,000 Units by mouth daily.    [provider]  Coenzyme Q10 (COQ10) 200 MG CAPS Take 1 capsule by mouth daily.    [provider]  furosemide (LASIX) 40 MG tablet Take 2 tablets (80 mg total) by mouth 2 (two) times daily. 04/05/19 07/04/19  Richardo Priest, MD  GINKGO BILOBA PO Take 1 capsule by mouth 2 (two) times daily.    [provider]  Multiple Vitamins-Minerals (MULTIVITAMIN WITH MINERALS) tablet Take 1 tablet by mouth daily.    [provider]  omeprazole (PRILOSEC) 20 MG capsule Take 1 capsule (20 mg total) by mouth daily. 05/08/19   Collene Gobble, MD  Probiotic Product (PROBIOTIC PO) Take 1 tablet by mouth daily.    [provider]  sacubitril-valsartan (ENTRESTO) 24-26 MG Take 1 tablet by mouth 2 (two) times daily. 03/14/19   Richardo Priest, MD     Allergies:    Allergies  Allergen Reactions  . Doxycycline Nausea Only    Vertigo  . Macrobid [Nitrofurantoin] Other (See Comments)    Nerve pain  . Valsartan Other (See Comments)    Blood pressure rise  . Lisinopril Cough  . Calcium Channel Blockers Palpitations    Blow pressure rise / tachycardia   . Penicillins Rash    Has patient had a PCN reaction causing immediate rash, facial/tongue/throat swelling, SOB or lightheadedness with hypotension: Yes Has patient had a PCN reaction causing severe rash involving mucus membranes or skin necrosis: Yes Has patient had a PCN reaction that required hospitalization: Unk Has patient had a PCN reaction occurring within the last 10 years: No If all of the above answers are "NO", then may proceed with Cephalosporin use.     Social History:   Social History   Socioeconomic History  . Marital status: Widowed    Spouse name: Not on file  .  Number of children: Not on file  . Years of education: Not on file  . Highest education level: Not on file  Occupational History  . Not on file  Tobacco Use  . Smoking status: Never Smoker  . Smokeless tobacco: Never Used  Substance and Sexual Activity  . Alcohol use: No  . Drug use: No  . Sexual activity: Not on file  Other Topics Concern  . Not on file  Social History Narrative  . Not on file   Social Determinants of Health   Financial Resource Strain:   . Difficulty of Paying Living Expenses: Not on file  Food Insecurity:   . Worried About Charity fundraiser in the Last Year: Not on file  .  Ran Out of Food in the Last Year: Not on file  Transportation Needs:   . Lack of Transportation (Medical): Not on file  . Lack of Transportation (Non-Medical): Not on file  Physical Activity:   . Days of Exercise per Week: Not on file  . Minutes of Exercise per Session: Not on file  Stress:   . Feeling of Stress : Not on file  Social Connections:   . Frequency of Communication with Friends and Family: Not on file  . Frequency of Social Gatherings with Friends and Family: Not on file  . Attends Religious Services: Not on file  . Active Member of Clubs or Organizations: Not on file  . Attends Archivist Meetings: Not on file  . Marital Status: Not on file  Intimate Partner Violence:   . Fear of Current or Ex-Partner: Not on file  . Emotionally Abused: Not on file  . Physically Abused: Not on file  . Sexually Abused: Not on file    Family History:   The patient's family history includes Diabetes in her mother and sister; Lung cancer in her father; Ovarian cancer in her mother; Stroke in her maternal grandfather.    ROS:  Please see the history of present illness.  All other ROS reviewed and negative.     Physical Exam/Data:   Vitals:   10/09/19 0915 10/09/19 0930 10/09/19 0932 10/09/19 0943  BP: (!) 153/100  (!) 154/98   Pulse: 88 84 81 92  Resp: (!) 23 (!) 27  (!) 26 (!) 26  Temp:      TempSrc:      SpO2: 93% 93% 93% 90%   No intake or output data in the 24 hours ending 10/09/19 1110 Last 3 Weights 04/05/2019 03/14/2019 02/16/2019  Weight (lbs) 122 lb 1.9 oz 132 lb 126 lb  Weight (kg) 55.393 kg 59.875 kg 57.153 kg     There is no height or weight on file to calculate BMI.  General: Frail elderly WF in no acute distress HEENT: normal Lymph: no adenopathy Neck: + JVD Endocrine:  No thryomegaly Vascular: No carotid bruits; FA pulses 2+ bilaterally without bruits  Cardiac:  normal S1, S2; RRR; systolic murmur  Lungs:  Diminished breath sounds right base; 3L O2; no wheezing, rhonchi or rales  Abd: soft, nontender, no hepatomegaly  Ext: 2-3+edema B/L Musculoskeletal:  No deformities, BUE and BLE strength normal and equal Skin: warm and dry  Neuro:  CNs 2-12 intact, no focal abnormalities noted Psych:  Normal affect    EKG:  The ECG that was done 10/08/19 was personally reviewed and demonstrates EKG showed NSR, 90 bpm, PVC, and nonspecific T wave changes inferior leads, extreme axis deviation  Relevant CV Studies:  Echo 2019 Study Conclusions  - Left ventricle: The cavity size was moderately dilated. There was   mild concentric hypertrophy. Systolic function was severely   reduced. The estimated ejection fraction was in the range of 20%   to 25%. Diffuse hypokinesis. Features are consistent with a   pseudonormal left ventricular filling pattern, with concomitant   abnormal relaxation and increased filling pressure (grade 2   diastolic dysfunction). Doppler parameters are consistent with   elevated ventricular end-diastolic filling pressure. - Aortic valve: Trileaflet; mildly thickened, mildly calcified   leaflets. There was trivial regurgitation. - Mitral valve: Calcified annulus. There was mild regurgitation. - Left atrium: The atrium was moderately dilated. - Right ventricle: The cavity size was normal. Wall thickness was   normal.  Systolic function was normal. - Right atrium: The atrium was mildly dilated. - Pulmonary arteries: Systolic pressure was mildly to moderately   increased. PA peak pressure: 42 mm Hg (S). - Inferior vena cava: The vessel was dilated. The respirophasic   diameter changes were blunted (< 50%), consistent with elevated   central venous pressure. - Pericardium, extracardiac: There was no pericardial effusion.  Laboratory Data:  High Sensitivity Troponin:  No results for input(s): TROPONINIHS in the last 720 hours.    Chemistry Recent Labs  Lab 10/08/19 1446  NA 142  K 3.5  CL 100  CO2 30  GLUCOSE 172*  BUN 26*  CREATININE 1.04*  CALCIUM 9.3  GFRNONAA 50*  GFRAA 58*  ANIONGAP 12    No results for input(s): PROT, ALBUMIN, AST, ALT, ALKPHOS, BILITOT in the last 168 hours. Hematology Recent Labs  Lab 10/08/19 1446  WBC 8.3  RBC 5.18*  HGB 15.3*  HCT 47.6*  MCV 91.9  MCH 29.5  MCHC 32.1  RDW 16.1*  PLT 149*   BNP Recent Labs  Lab 10/08/19 1446  BNP >4,500.0*    DDimer No results for input(s): DDIMER in the last 168 hours.   Radiology/Studies:  DG Chest 2 View  Result Date: 10/08/2019 CLINICAL DATA:  C/o generalized weakness since yesterday and SOB. Pt also has CHF EXAM: CHEST - 2 VIEW COMPARISON:  Chest radiograph 12/15/2018 FINDINGS: Stable cardiomediastinal contours with enlarged heart size. There is a moderate/large right pleural effusion with heterogeneous opacities in the adjacent right lung, possibly atelectasis though infiltrate not excluded. The left lung is clear. No pneumothorax. No acute finding in the visualized skeleton. IMPRESSION: Moderate/large right pleural effusion with adjacent opacities, possibly atelectasis or infiltrate. Electronically Signed   By: Audie Pinto M.D.   On: 10/08/2019 15:08     Assessment and Plan:   Acute on Chronic Systolic and diastolic Heart Failure Patient presented with progressive dyspnea, lower leg edema, and  orthopnea. She was taking lasix 80 mg BID at home. BNP elevated tp >4,5000. CXR showed moderate right pleural effusion with adjacent opacities possible atelectasis or infiltrate.  - Admit patient and start IV lasix 80 mg BID - creatinine on admission 1.04. continue to trend - Trend electrolyte with diuresis - Monitor daily weights and Strict I/Os - Echo in 10/2017 showed EF 20-25% with G2DD, trivial TR, mild MR, LA and RA mod dilated. Will repeat - Continue home Entresto  - Was previously on spiro>>reports it was discontinued for hypotension - Continue Coreg 3.125 mg BID  Hypertensive Heart disease/NICM - Was on coreg 3.125 mg BID, Entresto 24-46mg  BID at baseline - HTN elevated on admission and started on IV NTG>> wean off and restart home meds  Mitral Regurgitation - moderate per echo 2019 - recheck echo  Mild CAD - per Cath in 2017  - No chest pain    Severity of Illness: The appropriate patient status for this patient is INPATIENT. Inpatient status is judged to be reasonable and necessary in order to provide the required intensity of service to ensure the patient's safety. The patient's presenting symptoms, physical exam findings, and initial radiographic and laboratory data in the context of their chronic comorbidities is felt to place them at high risk for further clinical deterioration. Furthermore, it is not anticipated that the patient will be medically stable for discharge from the hospital within 2 midnights of admission. The following factors support the patient status of inpatient.   " The patient's presenting symptoms include  shortness of breath and weakness. " The worrisome physical exam findings include JVD and lower leg edema. " The initial radiographic and laboratory data are worrisome because of elevated BNP. " The chronic co-morbidities include HTN.   * I certify that at the point of admission it is my clinical judgment that the patient will require inpatient  hospital care spanning beyond 2 midnights from the point of admission due to high intensity of service, high risk for further deterioration and high frequency of surveillance required.*    For questions or updates, please contact [place] Please consult [email or web address] for contact info under     Signed, Cadence Ninfa Meeker, PA-C  10/09/2019 11:10 AM  As above, patient seen and examined.  Briefly she is an 86 year old female with past medical history of nonischemic cardiomyopathy, chronic systolic congestive heart failure, mild coronary disease, hypertension admitted with acute on chronic systolic congestive heart failure.  Most recent echocardiogram showed ejection fraction 15%, dilated left ventricle, biatrial enlargement and moderate mitral regurgitation.  Patient complains of recent progressive increasing dyspnea on exertion.  Also with orthopnea.  She describes increasing lower extremity edema over the past 1 week.  She has gained 3 pounds by her report.  She denies chest pain, fevers, chills.  Cough productive of whitish sputum.  Cardiology asked to evaluate.  Physical exam shows JVD, diminished breath sounds right lower lobe, 2/6 systolic murmur apex, [id number] bilateral lower extremity edema to the knees. Chest x-ray with right effusion.  BNP greater than 4500.  Electrocardiogram shows sinus rhythm, PACs, right axis deviation.  1 acute on chronic systolic congestive heart failure-patient significantly volume overloaded.  Will begin Lasix 80 mg IV twice daily.  Add spironolactone 12.5 mg daily.  Follow renal function closely.  Needs fluid restriction and low-sodium diet.  2 history of cardiomyopathy-we will plan to repeat echocardiogram.  Resume Entresto and carvedilol.  Advance as tolerated by pulse and blood pressure.  3 hypertension-blood pressure elevated but patient has not taken her medications today or yesterday.  Will resume and adjust regimen as needed.  4 mitral regurgitation-moderate on  last echocardiogram.  Repeat study.  Kirk Ruths

## 2019-10-09 NOTE — ED Notes (Signed)
Messaged main pharmacy in regards to verifying medications.

## 2019-10-09 NOTE — ED Notes (Signed)
Ordered pt a lunch tray 

## 2019-10-09 NOTE — ED Notes (Signed)
Patient woke up from a nap and was altered from earlier today. Pt able to tell me her name, confused to location, time, and situation. No unilateral weakness noted or change in speech.

## 2019-10-09 NOTE — Progress Notes (Signed)
  Echocardiogram 2D Echocardiogram has been performed.  [name] 10/09/2019, 1:27 PM

## 2019-10-09 NOTE — ED Provider Notes (Signed)
[facility] EMERGENCY DEPARTMENT Provider Note   CSN: [id number] Arrival date & time: 10/08/19  1402     History Chief Complaint  Patient presents with  . Weakness  . Congestive Heart Failure    Barbara Kelley is a 86 y.o. female.  HPI  But complains of shortness of breath and generalized weakness that began 2 days ago and has been progressively worsening since. Patient states that she knows that she gets more easily out of breath when she is ambulating around her house which she does so with a walker. Patient states that at times she feels too weak to walk.  Does endorse some gasping for air at night and states that she props her self up on one large pillow.  Patient also states that she has a history of hemorrhoids and states that she has had some moderate bleeding for stool was concerned that she was anemic causing her weakness.   Patient denies any dizziness or lightheadedness.  States that she has been taking her home Lasix 40 mg twice a day.       Past Medical History:  Diagnosis Date  . Arthritis   . Cardiomyopathy ([place])   . Congestive heart failure (CHF) ([place])   . GERD (gastroesophageal reflux disease)    currently non problematic   . Hypertension   . 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 Pneumonia    x2 most recent fall of 2017   . PONV (postoperative nausea and vomiting)   . Stroke [facility]) 2008   deficit of neuroplathy in left hand and left foot     Patient Active Problem List   Diagnosis Date Noted  . Mitral regurgitation 08/11/2018  . Mild CAD 08/11/2018  . Closed fracture of right hip ([place])   . Closed subcapital fracture of neck of femur, right, initial encounter ([place]) 10/18/2017  . Hypertensive heart disease without CHF 10/18/2017  . NICM (nonischemic cardiomyopathy) ([place]) 10/18/2017  . Chronic systolic heart failure ([place]) 10/18/2017  . HLD (hyperlipidemia) 10/18/2017  . Near syncope 10/18/2017  . S/P left TKA  05/04/2017  . S/P total knee replacement 05/04/2017  . History of CVA (cerebrovascular accident) 10/05/2006    Past Surgical History:  Procedure Laterality Date  . ABDOMINAL HYSTERECTOMY    . APPENDECTOMY    . BLADDER SUSPENSION    . CARDIAC CATHETERIZATION  10/2015  . CHOLECYSTECTOMY    . HEMIARTHROPLASTY HIP Right 10/20/2017  . HIP ARTHROPLASTY Right 10/19/2017   Procedure: ARTHROPLASTY BIPOLAR HIP (HEMIARTHROPLASTY);  Surgeon: Paralee Cancel, MD;  Location: [facility];  Service: Orthopedics;  Laterality: Right;  . JOINT REPLACEMENT Left 2013   left hip  . TONSILLECTOMY    . TOTAL KNEE ARTHROPLASTY Left 05/04/2017   Procedure: LEFT TOTAL KNEE ARTHROPLASTY;  Surgeon: Paralee Cancel, MD;  Location: [facility];  Service: Orthopedics;  Laterality: Left;  70 mins  . TUBAL LIGATION       OB History   No obstetric history on file.     Family History  Problem Relation Age of Onset  . Diabetes Mother   . Ovarian cancer Mother   . Lung cancer Father   . Diabetes Sister   . Stroke Maternal Grandfather     Social History   Tobacco Use  . Smoking status: Never Smoker  . Smokeless tobacco: Never Used  Substance Use Topics  . Alcohol use: No  . Drug use: No  Home Medications Prior to Admission medications   Medication Sig Start Date End Date Taking? Authorizing Provider  b complex vitamins tablet Take 1 tablet by mouth daily.    [provider]  Calcium Carbonate (CALCIUM 600 PO) Take 3 tablets by mouth daily.    [provider]  carvedilol (COREG) 3.125 MG tablet Take 1 tablet (3.125 mg total) by mouth 2 (two) times daily with a meal. 11/15/18   Munley, Hilton Cork, MD  Cholecalciferol (VITAMIN D HIGH POTENCY) 25 MCG (1000 UT) capsule Take 1,000 Units by mouth daily.    [provider]  Coenzyme Q10 (COQ10) 200 MG CAPS Take 1 capsule by mouth daily.    [provider]  furosemide (LASIX) 40 MG tablet Take 2 tablets (80 mg total) by mouth 2 (two) times  daily. 04/05/19 07/04/19  Richardo Priest, MD  GINKGO BILOBA PO Take 1 capsule by mouth 2 (two) times daily.    [provider]  Multiple Vitamins-Minerals (MULTIVITAMIN WITH MINERALS) tablet Take 1 tablet by mouth daily.    [provider]  omeprazole (PRILOSEC) 20 MG capsule Take 1 capsule (20 mg total) by mouth daily. 05/08/19   Collene Gobble, MD  Probiotic Product (PROBIOTIC PO) Take 1 tablet by mouth daily.    [provider]  sacubitril-valsartan (ENTRESTO) 24-26 MG Take 1 tablet by mouth 2 (two) times daily. 03/14/19   Richardo Priest, MD    Allergies    Doxycycline, Macrobid [nitrofurantoin], Valsartan, Lisinopril, Calcium channel blockers, and Penicillins  Review of Systems   Review of Systems  Constitutional: Positive for activity change and appetite change. Negative for chills and fever.  HENT: Negative for congestion.   Eyes: Negative for pain.  Respiratory: Positive for shortness of breath. Negative for cough and chest tightness.   Cardiovascular: Positive for leg swelling. Negative for chest pain and palpitations.  Gastrointestinal: Positive for blood in stool (Small amounts of bright red blood-history of hemorrhoids). Negative for abdominal distention, abdominal pain, diarrhea, nausea and vomiting.  Genitourinary: Negative for dysuria.  Musculoskeletal: Negative for myalgias.  Skin: Negative for rash.  Neurological: Negative for dizziness and headaches.    Physical Exam Updated Vital Signs BP (!) 154/98   Pulse 92   Temp (!) 97.5 F (36.4 C) (Oral)   Resp (!) 26   SpO2 90%   Physical Exam Vitals and nursing note reviewed.  Constitutional:      General: She is not in acute distress.    Appearance: She is not ill-appearing.  HENT:     Head: Normocephalic and atraumatic.     Nose: Nose normal.     Mouth/Throat:     Mouth: Mucous membranes are moist.  Eyes:     General: No scleral icterus. Neck:     Comments: JVD Cardiovascular:      Rate and Rhythm: Normal rate and regular rhythm.     Pulses: Normal pulses.     Heart sounds: Murmur (Faint murmur auscultated) present.  Pulmonary:     Effort: Pulmonary effort is normal. No respiratory distress.     Comments: Increased rate of breathing, mild increased work of breathing.  Crackles auscultated diffusely in all lung fields.  Some decreased lung sounds right lower lung field. Abdominal:     Palpations: Abdomen is soft.     Tenderness: There is no abdominal tenderness.     Comments: No tenderness palpation of the abdomen  Musculoskeletal:     Cervical back: Normal range of  motion and neck supple. No tenderness.     Right lower leg: Edema present.     Left lower leg: Edema present.     Comments: 2+ pitting edema bilateral lower extremities to the level of the knee  Skin:    General: Skin is warm and dry.     Capillary Refill: Capillary refill takes less than 2 seconds.     Comments: Venous stasis dermatitis bilateral lower extremities.  Neurological:     Mental Status: She is alert. Mental status is at baseline.  Psychiatric:        Mood and Affect: Mood normal.        Behavior: Behavior normal.     ED Results / Procedures / Treatments   Labs (all labs ordered are listed, but only abnormal results are displayed) Labs Reviewed  BASIC METABOLIC PANEL - Abnormal; Notable for the following components:      Result Value   Glucose, Bld 172 (*)    BUN 26 (*)    Creatinine, Ser 1.04 (*)    GFR calc non Af Amer 50 (*)    GFR calc Af Amer 58 (*)    All other components within normal limits  CBC - Abnormal; Notable for the following components:   RBC 5.18 (*)    Hemoglobin 15.3 (*)    HCT 47.6 (*)    RDW 16.1 (*)    Platelets 149 (*)    All other components within normal limits  BRAIN NATRIURETIC PEPTIDE - Abnormal; Notable for the following components:   B Natriuretic Peptide >4,500.0 (*)    All other components within normal limits  SARS CORONAVIRUS 2 (TAT 6-24  HRS)    EKG EKG Interpretation  Date/Time:  Sunday October 08 2019 14:38:07 EST Ventricular Rate:  90 PR Interval:  200 QRS Duration: 104 QT Interval:  398 QTC Calculation: 486 R Axis:   -140 Text Interpretation: Sinus rhythm with occasional Premature ventricular complexes Right superior axis deviation Anterior infarct , age undetermined Abnormal ECG When compared with ECG of 10/18/2017, Premature ventricular complexes are now present Right superior axis deviation is now present Confirmed by Delora Fuel ([id number]) on 10/08/2019 11:50:13 PM   Radiology DG Chest 2 View  Result Date: 10/08/2019 CLINICAL DATA:  C/o generalized weakness since yesterday and SOB. Pt also has CHF EXAM: CHEST - 2 VIEW COMPARISON:  Chest radiograph 12/15/2018 FINDINGS: Stable cardiomediastinal contours with enlarged heart size. There is a moderate/large right pleural effusion with heterogeneous opacities in the adjacent right lung, possibly atelectasis though infiltrate not excluded. The left lung is clear. No pneumothorax. No acute finding in the visualized skeleton. IMPRESSION: Moderate/large right pleural effusion with adjacent opacities, possibly atelectasis or infiltrate. Electronically Signed   By: Audie Pinto M.D.   On: 10/08/2019 15:08    Procedures Procedures (including critical care time) CRITICAL CARE Performed by: Tedd Sias   Total critical care time: 35 minutes  Critical care time was exclusive of separately billable procedures and treating other patients.  Critical care was necessary to treat or prevent imminent or life-threatening deterioration.  Critical care was time spent personally by me on the following activities: development of treatment plan with patient and/or surrogate as well as nursing, discussions with consultants, evaluation of patient's response to treatment, examination of patient, obtaining history from patient or surrogate, ordering and performing treatments and  interventions, ordering and review of laboratory studies, ordering and review of radiographic studies, pulse oximetry and re-evaluation of patient's condition.  Patient with acutely elevated blood pressure and pulmonary edema with pleural effusion is short of breath and hypoxic.  Mildly tachycardic with heart rate of 92 which is increased from her triage heart rate.  Patient requires IV nitroglycerin for blood pressure for afterload reduction.  Medications Ordered in ED Medications  nitroGLYCERIN 50 mg in dextrose 5 % 250 mL (0.2 mg/mL) infusion (5 mcg/min Intravenous New Bag/Given 10/09/19 0942)  nitroGLYCERIN (NITROSTAT) SL tablet 0.4 mg (has no administration in time range)  furosemide (LASIX) injection 80 mg (80 mg Intravenous Given 10/09/19 0935)    ED Course  I have reviewed the triage vital signs and the nursing notes.  Pertinent labs & imaging results that were available during my care of the patient were reviewed by me and considered in my medical decision making (see chart for details).  Clinical Course as of Oct 08 1005  Mon Oct 09, 2019  [id number] Chest x-ray with large right-sided pleural effusion.  Independently viewed by myself.  The read indicates possible infiltrate however patient is afebrile and I doubt pneumonia.  Suspect pleural effusion due to CHF  DG Chest 2 View [WF]  [phone] New right axis deviation.  Likely due to CHF.  ED EKG [WF]  0854 BMP consistent with prerenal AKI likely due to hypoperfusion of kidneys due to CHF.  Basic metabolic panel(!) [WF]  [id number] No leukocytosis or anemia  CBC(!) [WF]    Clinical Course User Index [WF] Tedd Sias, [place]   MDM Rules/Calculators/A&P                      Patient is a 86 year old female with a history of CHF presented today with worsening shortness of breath over the past 2 days with generalized weakness as well.   Endorses chronic swelling of her legs as well as some new worsening shortness of breath at night but denies  gasping for air or being awoken dyspneic.  Also states she has been propping her self on a larger pillow than usual.  States that she has been taking Lasix as prescribed.  Denies any new increase in salt or any dietary indiscretion.  All labs x-rays and EKG is interpreted above.  Patient has significant elevation in BNP.  She appears to have had similar elevations BMP in the past and this may be chronic however she appears fluid overloaded on physical exam with crackles and JVD with lower extremity edema.  She also has pleural effusion on her chest x-ray.  The patient has had this in the past on review of EMR.  I suspect that the pleural effusion and pulmonary edema may be causing her shortness of breath today.   Discussed with heart failure team to evaluate whether patient is a candidate for [facility] heart failure trial.  As she is a 86 years old she is not able to be included in route.  Heart failure team recommended admission by internal medicine  9:28 AM discussed case with Dr. Tamala Julian of internal medicine [facility].  He accepts this admission and will see patient in ED.  Covid test pending at this time.  Reassessed patient at bedside.  She is satting 90% on room air at rest.  She has been placed on oxygen again and nitro drip ordered for afterload reduction.  Will reassess at regular intervals. Patient with acutely elevated blood pressure and pulmonary edema with pleural effusion is short of breath and hypoxic.  Mildly tachycardic with heart rate of 92  which is increased from her triage heart rate.  Patient requires IV nitroglycerin for blood pressure for afterload reduction.  Barbara Kelley was evaluated in Emergency Department on 10/09/2019 for the symptoms described in the history of present illness. She was evaluated in the context of the global COVID-19 pandemic, which necessitated consideration that the patient might be at risk for infection with the SARS-CoV-2 virus that causes  COVID-19. Institutional protocols and algorithms that pertain to the evaluation of patients at risk for COVID-19 are in a state of rapid change based on information released by regulatory bodies including the CDC and federal and state organizations. These policies and algorithms were followed during the patient's care in the ED. I discussed this case with my attending physician who cosigned this note including patient's presenting symptoms, physical exam, and planned diagnostics and interventions. Attending physician stated agreement with plan or made changes to plan which were implemented.  Attending physician assessed patient at bedside.    Final Clinical Impression(s) / ED Diagnoses Final diagnoses:  Acute on chronic congestive heart failure, unspecified heart failure type [facility])    Rx / DC Orders ED Discharge Orders    None       Tedd Sias, [place] 10/09/19 1009    Lajean Saver, MD 10/09/19 1416

## 2019-10-09 NOTE — Progress Notes (Addendum)
Was paged by nurse with concerns for AMS. Patient woke up from a nap and was Oriented x 1. No unilateral weakness or change in speech noted. No fevers. Vitals stable. She was given dinner. Head CT and Lactic Acid ordered.   After dinner patient is much improved. She is oriented x 3. No neurological deficits noted on exam. She has chronic neuropathy and is often confused after waking up. (per patient report). Repeat CMP and CBC.  Burtis Imhoff Kathlen Mody, PA-C

## 2019-10-09 NOTE — ED Notes (Signed)
Pt was talking on phone to daughter when RNs entered room.  Pt appears alert and oriented at this time.  States she is "fine", pleasant and comfortable.

## 2019-10-09 NOTE — ED Notes (Signed)
Pt has lunch tray at bedside  

## 2019-10-09 NOTE — ED Notes (Signed)
Spoke with cardiology, will order lactic and possibly a head CT.

## 2019-10-09 NOTE — Plan of Care (Signed)
Cardiology notified regarding the patient was seen by [facility] presenting with what appears to be a congestive heart failure exacerbation.  [facility] agreed to formally admit this patient.

## 2019-10-09 NOTE — ED Notes (Signed)
Pt altered from earlier.  Could not tell this RN where she was at and falling asleep while communicating.   Paged admitting.

## 2019-10-10 ENCOUNTER — Encounter (HOSPITAL_COMMUNITY): Payer: Self-pay | Admitting: Cardiology

## 2019-10-10 LAB — BASIC METABOLIC PANEL
Anion gap: 12 (ref 5–15)
BUN: 28 mg/dL — ABNORMAL HIGH (ref 8–23)
CO2: 30 mmol/L (ref 22–32)
Calcium: 8.8 mg/dL — ABNORMAL LOW (ref 8.9–10.3)
Chloride: 101 mmol/L (ref 98–111)
Creatinine, Ser: 0.93 mg/dL (ref 0.44–1.00)
GFR calc Af Amer: >60 mL/min (ref >60)
GFR calc non Af Amer: 57 mL/min — ABNORMAL LOW (ref >60)
Glucose, Bld: 111 mg/dL — ABNORMAL HIGH (ref 70–99)
Potassium: 4.1 mmol/L (ref 3.5–5.1)
Sodium: 143 mmol/L (ref 135–145)

## 2019-10-10 LAB — LACTIC ACID, PLASMA: Lactic Acid, Venous: 1 mmol/L (ref 0.5–1.9)

## 2019-10-10 NOTE — Progress Notes (Signed)
Spoke with pt's daughter Nat Math) with pt's permission regarding plan of care. Questions and concerns answered.

## 2019-10-10 NOTE — TOC Progression Note (Signed)
Transition of Care [facility]) - Progression Note    Patient Details  Name: Barbara Kelley MRN: [id number] Date of Birth: 08-Apr-1934  Transition of [facility] CM/SW Contact  Zenon Mayo, RN Phone Number: 10/10/2019, 12:09 PM  Clinical Narrative:    NCM spoke with patient, she states she is form [employer], NCM offered to set up [facility] for CHF disease management.  Patient states she does not want a HHRN. She states she has her own transport at dc but they can also provide transport for her  If needed.           Expected Discharge Plan and Services                                                 Social Determinants of Health (SDOH) Interventions    Readmission Risk Interventions No flowsheet data found.

## 2019-10-10 NOTE — Progress Notes (Signed)
Progress Note  Patient Name: Barbara Kelley Date of Encounter: 10/10/2019  Primary Cardiologist: Shirlee More, MD   Subjective   Dyspnea improving; no CP  Inpatient Medications    Scheduled Meds: . carvedilol  3.125 mg Oral BID WC  . enoxaparin (LOVENOX) injection  40 mg Subcutaneous Q24H  . furosemide  80 mg Intravenous BID  . pantoprazole  40 mg Oral Daily  . sacubitril-valsartan  1 tablet Oral BID  . sodium chloride flush  3 mL Intravenous Q12H  . spironolactone  12.5 mg Oral Daily   Continuous Infusions: . sodium chloride    . nitroGLYCERIN 10 mcg/min (10/09/19 1952)   PRN Meds: sodium chloride, acetaminophen, nitroGLYCERIN, ondansetron (ZOFRAN) IV, sodium chloride flush   Vital Signs    Vitals:   10/10/19 0700 10/10/19 0715 10/10/19 0815 10/10/19 0816  BP: 135/89 132/86 131/81   Pulse: 89 91 81   Resp: (!) 24 19 18    Temp:   98.9 F (37.2 C)   TempSrc:   Oral   SpO2:   98%   Weight:    57.9 kg  Height:    4\' 11"  (1.499 m)    Intake/Output Summary (Last 24 hours) at 10/10/2019 0840 Last data filed at 10/09/2019 1730 Gross per 24 hour  Intake 480 ml  Output 750 ml  Net -270 ml   Last 3 Weights 10/10/2019 04/05/2019 03/14/2019  Weight (lbs) 127 lb 10.3 oz 122 lb 1.9 oz 132 lb  Weight (kg) 57.9 kg 55.393 kg 59.875 kg      Telemetry    Sinus with occasional PAC and PVC- Personally Reviewed  Physical Exam   GEN: No acute distress.   Neck: supple Cardiac: RRR, no murmurs, rubs, or gallops.  Respiratory: Diffuse rhonchi GI: Soft, nontender, non-distended  MS: 1+ edema Neuro:  Nonfocal  Psych: Normal affect   Labs     Chemistry Recent Labs  Lab 10/08/19 1446 10/09/19 1246 10/09/19 1952 10/10/19 0520  NA 142  --  141 143  K 3.5  --  5.0 4.1  CL 100  --  99 101  CO2 30  --  28 30  GLUCOSE 172*  --  154* 111*  BUN 26*  --  29* 28*  CREATININE 1.04* 0.82 1.01* 0.93  CALCIUM 9.3  --  8.9 8.8*  PROT  --   --  6.1*  --   ALBUMIN  --   --  3.5  --    AST  --   --  65*  --   ALT  --   --  16  --   ALKPHOS  --   --  65  --   BILITOT  --   --  1.6*  --   GFRNONAA 50* >60 51* 57*  GFRAA 58* >60 60* >60  ANIONGAP 12  --  14 12     Hematology Recent Labs  Lab 10/08/19 1446 10/09/19 1246 10/09/19 1952  WBC 8.3 7.6 9.0  RBC 5.18* 4.90 5.13*  HGB 15.3* 14.1 14.6  HCT 47.6* 44.1 46.9*  MCV 91.9 90.0 91.4  MCH 29.5 28.8 28.5  MCHC 32.1 32.0 31.1  RDW 16.1* 15.9* 16.1*  PLT 149* 166 169    BNP Recent Labs  Lab 10/08/19 1446  BNP >4,500.0*      Radiology    DG Chest 2 View  Result Date: 10/08/2019 CLINICAL DATA:  C/o generalized weakness since yesterday and SOB. Pt also has CHF EXAM: CHEST -  2 VIEW COMPARISON:  Chest radiograph 12/15/2018 FINDINGS: Stable cardiomediastinal contours with enlarged heart size. There is a moderate/large right pleural effusion with heterogeneous opacities in the adjacent right lung, possibly atelectasis though infiltrate not excluded. The left lung is clear. No pneumothorax. No acute finding in the visualized skeleton. IMPRESSION: Moderate/large right pleural effusion with adjacent opacities, possibly atelectasis or infiltrate. Electronically Signed   By: Audie Pinto M.D.   On: 10/08/2019 15:08   CT HEAD WO CONTRAST  Result Date: 10/09/2019 CLINICAL DATA:  86 year old female with altered mental status. EXAM: CT HEAD WITHOUT CONTRAST TECHNIQUE: Contiguous axial images were obtained from the base of the skull through the vertex without intravenous contrast. COMPARISON:  Head CT dated 10/18/2017. FINDINGS: Brain: There is mild age-related atrophy and moderate chronic microvascular ischemic changes. 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 There is no acute intracranial hemorrhage. No mass effect or midline shift. No extra-axial fluid collection. Vascular: No hyperdense vessel or unexpected calcification. Skull: Normal. Negative for fracture or focal lesion.  Sinuses/Orbits: Bilateral maxillary sinus retention cyst or polyps measuring up to 15 mm on the left. No air-fluid level. The mastoid air cells are clear. Other: None IMPRESSION: 1. No acute intracranial hemorrhage. 2. Age-related atrophy and chronic microvascular ischemic changes. 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 Electronically Signed   By: Anner Crete M.D.   On: 10/09/2019 21:12   ECHOCARDIOGRAM COMPLETE  Result Date: 10/09/2019   ECHOCARDIOGRAM REPORT   Patient Name:   [name] Date of Exam: 10/09/2019 Medical Rec #:  [id number]      Height:       59.0 in Accession #:    [id number]     Weight:       122.1 lb Date of Birth:  03-Jan-1934      BSA:          1.50 m Patient Age:    13 years       BP:           140/89 mmHg Patient Gender: F              HR:           90 bpm. Exam Location:  Inpatient Procedure: 2D Echo Indications:    Cardiomyopathy-Unspecified 425.9 / I42.9  History:        Patient has prior history of Echocardiogram examinations, most                 recent 05/16/2018. NICM and CHF, CAD, COPD; Risk                 Factors:Hypertension and Dyslipidemia.  Sonographer:    Vikki Ports Turrentine Referring Phys: [id number] [place]  1. Left ventricular ejection fraction, by visual estimation, is 10 %%. The left ventricle has severely decreased function. There is no left ventricular hypertrophy.  2. Left ventricular diastolic parameters are consistent with Grade II diastolic dysfunction (pseudonormalization).  3. Severely dilated left ventricular internal cavity size.  4. The left ventricle demonstrates global hypokinesis.  5. Global right ventricle has low normal systolic function.The right ventricular size is normal. No increase in right ventricular wall thickness.  6. Left atrial size was severely dilated.  7. Right atrial size was severely dilated.  8. The mitral valve is normal in structure. Mild to moderate mitral valve regurgitation.  9. The  tricuspid valve is grossly normal. 10. The aortic valve is tricuspid. Aortic valve regurgitation  is not visualized. No evidence of aortic valve sclerosis or stenosis. 11. The pulmonic valve was normal in structure. Pulmonic valve regurgitation is trivial. 12. Moderately elevated pulmonary artery systolic pressure. 13. The atrial septum is grossly normal. FINDINGS  Left Ventricle: Left ventricular ejection fraction, by visual estimation, is 10 %%. The left ventricle has severely decreased function. The left ventricle demonstrates global hypokinesis. The left ventricular internal cavity size was severely dilated left ventricle. There is no left ventricular hypertrophy. Left ventricular diastolic parameters are consistent with Grade II diastolic dysfunction (pseudonormalization). Right Ventricle: The right ventricular size is normal. No increase in right ventricular wall thickness. Global RV systolic function is has low normal systolic function. The tricuspid regurgitant velocity is 2.84 m/s, and with an assumed right atrial pressure of 15 mmHg, the estimated right ventricular systolic pressure is moderately elevated at 47.2 mmHg. Left Atrium: Left atrial size was severely dilated. Right Atrium: Right atrial size was severely dilated Pericardium: There is no evidence of pericardial effusion. Mitral Valve: The mitral valve is normal in structure. Mild to moderate mitral valve regurgitation. Tricuspid Valve: The tricuspid valve is grossly normal. Tricuspid valve regurgitation is severe. Aortic Valve: The aortic valve is tricuspid. Aortic valve regurgitation is not visualized. The aortic valve is structurally normal, with no evidence of sclerosis or stenosis. Pulmonic Valve: The pulmonic valve was normal in structure. Pulmonic valve regurgitation is trivial. Pulmonic regurgitation is trivial. Aorta: The aortic root and ascending aorta are structurally normal, with no evidence of dilitation. IAS/Shunts: The atrial septum  is grossly normal.  LEFT VENTRICLE PLAX 2D LVIDd:         5.90 cm       Diastology LVIDs:         5.40 cm       LV e' lateral:   5.00 cm/s LV PW:         1.00 cm       LV E/e' lateral: 13.0 LV IVS:        1.00 cm LVOT diam:     2.00 cm LV SV:         32 ml LV SV Index:   20.83 LVOT Area:     3.14 cm  LV Volumes (MOD) LV area d, A2C:    32.50 cm LV area d, A4C:    30.80 cm LV area s, A2C:    28.10 cm LV area s, A4C:    26.40 cm LV major d, A2C:   7.40 cm LV major d, A4C:   7.12 cm LV major s, A2C:   6.94 cm LV major s, A4C:   7.03 cm LV vol d, MOD A2C: 123.0 ml LV vol d, MOD A4C: 111.0 ml LV vol s, MOD A2C: 95.2 ml LV vol s, MOD A4C: 86.0 ml LV SV MOD A2C:     27.8 ml LV SV MOD A4C:     111.0 ml LV SV MOD BP:      28.3 ml RIGHT VENTRICLE RV S prime:     7.18 cm/s LEFT ATRIUM              Index       RIGHT ATRIUM           Index LA diam:        4.80 cm  3.21 cm/m  RA Area:     32.40 cm LA Vol (A2C):   105.0 ml 70.21 ml/m RA Volume:   121.00 ml 80.91 ml/m LA Vol (  A4C):   74.2 ml  49.62 ml/m LA Biplane Vol: 90.0 ml  60.18 ml/m   AORTA Ao Root diam: 3.00 cm MITRAL VALVE                        TRICUSPID VALVE MV Area (PHT): 3.77 cm             TR Peak grad:   32.2 mmHg MV PHT:        58.29 msec           TR Vmax:        305.00 cm/s MV Decel Time: 201 msec MV E velocity: 65.10 cm/s 103 cm/s  SHUNTS MV A velocity: 72.40 cm/s 70.3 cm/s Systemic Diam: 2.00 cm MV E/A ratio:  0.90       1.5  Mertie Moores MD Electronically signed by Mertie Moores MD Signature Date/Time: 10/09/2019/3:14:30 PM    Final     Patient Profile     86 year old female with past medical history of nonischemic cardiomyopathy, chronic systolic congestive heart failure, mild coronary disease, hypertension admitted with acute on chronic systolic congestive heart failure.  Echocardiogram this admission shows ejection fraction [id number], grade 2 diastolic dysfunction, severe left ventricular enlargement, severe biatrial enlargement, mild to moderate  mitral regurgitation and moderate pulmonary hypertension.  Assessment & Plan    1 acute on chronic systolic congestive heart failure-I/O - 270. Weight 57.9 kg.  Symptoms improving.  Continue Lasix 80 mg IV twice daily and spironolactone 12.5 mg daily.  Follow renal function closely. Continue fluid restriction and low-sodium diet.  2 history of cardiomyopathy-follow-up echocardiogram shows ejection fraction approximately 10%.   Continue Entresto at present dose.  We will hold carvedilol at 3.125 mg twice daily but increase as CHF improves.  Continue spironolactone.  Discontinue IV nitroglycerin.  3 hypertension-blood pressure improving after resumption of medications.  Will increase Entresto and carvedilol as tolerated.  4 mitral regurgitation-mild to moderate on most recent echocardiogram.  For questions or updates, please contact [place] Please consult [email or web address] for contact info under        Signed, Kirk Ruths, MD  10/10/2019, 8:40 AM

## 2019-10-10 NOTE — Progress Notes (Signed)
Day shift Nurse Dannon spoke to [employer] via Phone, Engineer, structural passed on the message to this nurse that jill requesting Face time with the patient. By the time the Ipad of the unit available , the patient stated to do face time with her daughter in the morning. Will pass on day shift. As per patient she already spoke to her on her own phone.

## 2019-10-10 NOTE — ED Notes (Signed)
Tele   Breakfast ordered  

## 2019-10-11 ENCOUNTER — Inpatient Hospital Stay (HOSPITAL_COMMUNITY): Payer: Medicare PPO

## 2019-10-11 LAB — BASIC METABOLIC PANEL
Anion gap: 10 (ref 5–15)
BUN: 26 mg/dL — ABNORMAL HIGH (ref 8–23)
CO2: 37 mmol/L — ABNORMAL HIGH (ref 22–32)
Calcium: 9.1 mg/dL (ref 8.9–10.3)
Chloride: 97 mmol/L — ABNORMAL LOW (ref 98–111)
Creatinine, Ser: 0.88 mg/dL (ref 0.44–1.00)
GFR calc Af Amer: >60 mL/min (ref >60)
GFR calc non Af Amer: >60 mL/min (ref >60)
Glucose, Bld: 137 mg/dL — ABNORMAL HIGH (ref 70–99)
Potassium: 3.9 mmol/L (ref 3.5–5.1)
Sodium: 144 mmol/L (ref 135–145)

## 2019-10-11 MED ORDER — FUROSEMIDE 10 MG/ML IJ SOLN
120.0000 mg | Freq: Two times a day (BID) | INTRAVENOUS | Status: DC
  Administered 2019-10-11 – 2019-10-14 (×7): 120 mg via INTRAVENOUS
  Filled 2019-10-11: qty 12
  Filled 2019-10-11: qty 2
  Filled 2019-10-11 (×2): qty 12
  Filled 2019-10-11 (×2): qty 10
  Filled 2019-10-11: qty 12
  Filled 2019-10-11 (×3): qty 10

## 2019-10-11 NOTE — Progress Notes (Signed)
Pt had 12 bts wqrs as per CCMD. PT denies complains.  BP 118/73 (BP Location: Left Arm)   Pulse 69   Temp 98.7 F (37.1 C) (Oral)   Resp 17   Ht 4\' 11"  (1.499 m)   Wt 55.1 kg   SpO2 100%   BMI 24.53 kg/m    Will monitor

## 2019-10-11 NOTE — Progress Notes (Signed)
PT Cancellation Note  Patient Details Name: AARIKA DAVISON MRN: [id number] DOB: 1933-10-30   Cancelled Treatment:    Reason Eval/Treat Not Completed: Medical issues which prohibited therapy. PA assessing pt when PT attempted PT eval. Spoke to nurse who requested PT to hold off at this time. Pt with 13 beats of v-tach at rest per nursing. Will check back as schedule permits.   Galen Manila 10/11/2019, 10:25 AM

## 2019-10-11 NOTE — Progress Notes (Addendum)
Progress Note  Patient Name: Barbara Kelley Date of Encounter: 10/11/2019  Primary Cardiologist: Kirk Ruths, MD   Subjective   Patient with labored breathing during exam. Just received IV lasix. No chest pain. Noted to have cold extremities. 2 runs of Vtach on tele, longest 14 beats.   Patient did not put out much urine overnight and stomach is distended.   Inpatient Medications    Scheduled Meds: . carvedilol  3.125 mg Oral BID WC  . enoxaparin (LOVENOX) injection  40 mg Subcutaneous Q24H  . furosemide  80 mg Intravenous BID  . pantoprazole  40 mg Oral Daily  . sacubitril-valsartan  1 tablet Oral BID  . sodium chloride flush  3 mL Intravenous Q12H  . spironolactone  12.5 mg Oral Daily   Continuous Infusions: . sodium chloride     PRN Meds: sodium chloride, acetaminophen, nitroGLYCERIN, ondansetron (ZOFRAN) IV, sodium chloride flush   Vital Signs    Vitals:   10/11/19 0845 10/11/19 0900 10/11/19 0917 10/11/19 0932  BP: 133/81 124/78 129/72 128/73  Pulse: 75 75 72 70  Resp:      Temp:      TempSrc:      SpO2: 90%  97% 98%  Weight:      Height:        Intake/Output Summary (Last 24 hours) at 10/11/2019 0959 Last data filed at 10/11/2019 0900 Gross per 24 hour  Intake 720 ml  Output 1000 ml  Net -280 ml   Last 3 Weights 10/11/2019 10/10/2019 04/05/2019  Weight (lbs) 121 lb 7.6 oz 127 lb 10.3 oz 122 lb 1.9 oz  Weight (kg) 55.1 kg 57.9 kg 55.393 kg      Telemetry    NSR, HR 80,s 2 runs of Vtach, longest 13 beats - Personally Reviewed  ECG    No new - Personally Reviewed  Physical Exam   GEN: No acute distress.   Neck: +JVD Cardiac: RRR, no murmurs, rubs, or gallops.  Respiratory: Diminished breath sounds right. GI: Soft, nontender, +distended  MS: 1+ edema; No deformity. Neuro:  Nonfocal  Psych: Normal affect   Labs    High Sensitivity Troponin:  No results for input(s): TROPONINIHS in the last 720 hours.    Chemistry Recent Labs  Lab  10/09/19 1952 10/10/19 0520 10/11/19 0513  NA 141 143 144  K 5.0 4.1 3.9  CL 99 101 97*  CO2 28 30 37*  GLUCOSE 154* 111* 137*  BUN 29* 28* 26*  CREATININE 1.01* 0.93 0.88  CALCIUM 8.9 8.8* 9.1  PROT 6.1*  --   --   ALBUMIN 3.5  --   --   AST 65*  --   --   ALT 16  --   --   ALKPHOS 65  --   --   BILITOT 1.6*  --   --   GFRNONAA 51* 57* >60  GFRAA 60* >60 >60  ANIONGAP 14 12 10      Hematology Recent Labs  Lab 10/08/19 1446 10/09/19 1246 10/09/19 1952  WBC 8.3 7.6 9.0  RBC 5.18* 4.90 5.13*  HGB 15.3* 14.1 14.6  HCT 47.6* 44.1 46.9*  MCV 91.9 90.0 91.4  MCH 29.5 28.8 28.5  MCHC 32.1 32.0 31.1  RDW 16.1* 15.9* 16.1*  PLT 149* 166 169    BNP Recent Labs  Lab 10/08/19 1446  BNP >4,500.0*     DDimer No results for input(s): DDIMER in the last 168 hours.   Radiology  CT HEAD WO CONTRAST  Result Date: 10/09/2019 CLINICAL DATA:  86 year old female with altered mental status. EXAM: CT HEAD WITHOUT CONTRAST TECHNIQUE: Contiguous axial images were obtained from the base of the skull through the vertex without intravenous contrast. COMPARISON:  Head CT dated 10/18/2017. FINDINGS: Brain: There is mild age-related atrophy and moderate chronic microvascular ischemic changes. 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 There is no acute intracranial hemorrhage. No mass effect or midline shift. No extra-axial fluid collection. Vascular: No hyperdense vessel or unexpected calcification. Skull: Normal. Negative for fracture or focal lesion. Sinuses/Orbits: Bilateral maxillary sinus retention cyst or polyps measuring up to 15 mm on the left. No air-fluid level. The mastoid air cells are clear. Other: None IMPRESSION: 1. No acute intracranial hemorrhage. 2. Age-related atrophy and chronic microvascular ischemic changes. 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 Electronically Signed   By: Anner Crete M.D.   On: 10/09/2019  21:12   ECHOCARDIOGRAM COMPLETE  Result Date: 10/09/2019   ECHOCARDIOGRAM REPORT   Patient Name:   Barbara Kelley Date of Exam: 10/09/2019 Medical Rec #:  [id number]      Height:       59.0 in Accession #:    [id number]     Weight:       122.1 lb Date of Birth:  October 12, 1933      BSA:          1.50 m Patient Age:    22 years       BP:           140/89 mmHg Patient Gender: F              HR:           90 bpm. Exam Location:  Inpatient Procedure: 2D Echo Indications:    Cardiomyopathy-Unspecified 425.9 / I42.9  History:        Patient has prior history of Echocardiogram examinations, most                 recent 05/16/2018. NICM and CHF, CAD, COPD; Risk                 Factors:Hypertension and Dyslipidemia.  Sonographer:    Vikki Ports Turrentine Referring Phys: [id number] [place]  1. Left ventricular ejection fraction, by visual estimation, is 10 %%. The left ventricle has severely decreased function. There is no left ventricular hypertrophy.  2. Left ventricular diastolic parameters are consistent with Grade II diastolic dysfunction (pseudonormalization).  3. Severely dilated left ventricular internal cavity size.  4. The left ventricle demonstrates global hypokinesis.  5. Global right ventricle has low normal systolic function.The right ventricular size is normal. No increase in right ventricular wall thickness.  6. Left atrial size was severely dilated.  7. Right atrial size was severely dilated.  8. The mitral valve is normal in structure. Mild to moderate mitral valve regurgitation.  9. The tricuspid valve is grossly normal. 10. The aortic valve is tricuspid. Aortic valve regurgitation is not visualized. No evidence of aortic valve sclerosis or stenosis. 11. The pulmonic valve was normal in structure. Pulmonic valve regurgitation is trivial. 12. Moderately elevated pulmonary artery systolic pressure. 13. The atrial septum is grossly normal. FINDINGS  Left Ventricle: Left ventricular ejection fraction,  by visual estimation, is 10 %%. The left ventricle has severely decreased function. The left ventricle demonstrates global hypokinesis. The left ventricular internal cavity size was severely dilated left ventricle. There is  no left ventricular hypertrophy. Left ventricular diastolic parameters are consistent with Grade II diastolic dysfunction (pseudonormalization). Right Ventricle: The right ventricular size is normal. No increase in right ventricular wall thickness. Global RV systolic function is has low normal systolic function. The tricuspid regurgitant velocity is 2.84 m/s, and with an assumed right atrial pressure of 15 mmHg, the estimated right ventricular systolic pressure is moderately elevated at 47.2 mmHg. Left Atrium: Left atrial size was severely dilated. Right Atrium: Right atrial size was severely dilated Pericardium: There is no evidence of pericardial effusion. Mitral Valve: The mitral valve is normal in structure. Mild to moderate mitral valve regurgitation. Tricuspid Valve: The tricuspid valve is grossly normal. Tricuspid valve regurgitation is severe. Aortic Valve: The aortic valve is tricuspid. Aortic valve regurgitation is not visualized. The aortic valve is structurally normal, with no evidence of sclerosis or stenosis. Pulmonic Valve: The pulmonic valve was normal in structure. Pulmonic valve regurgitation is trivial. Pulmonic regurgitation is trivial. Aorta: The aortic root and ascending aorta are structurally normal, with no evidence of dilitation. IAS/Shunts: The atrial septum is grossly normal.  LEFT VENTRICLE PLAX 2D LVIDd:         5.90 cm       Diastology LVIDs:         5.40 cm       LV e' lateral:   5.00 cm/s LV PW:         1.00 cm       LV E/e' lateral: 13.0 LV IVS:        1.00 cm LVOT diam:     2.00 cm LV SV:         32 ml LV SV Index:   20.83 LVOT Area:     3.14 cm  LV Volumes (MOD) LV area d, A2C:    32.50 cm LV area d, A4C:    30.80 cm LV area s, A2C:    28.10 cm LV area s,  A4C:    26.40 cm LV major d, A2C:   7.40 cm LV major d, A4C:   7.12 cm LV major s, A2C:   6.94 cm LV major s, A4C:   7.03 cm LV vol d, MOD A2C: 123.0 ml LV vol d, MOD A4C: 111.0 ml LV vol s, MOD A2C: 95.2 ml LV vol s, MOD A4C: 86.0 ml LV SV MOD A2C:     27.8 ml LV SV MOD A4C:     111.0 ml LV SV MOD BP:      28.3 ml RIGHT VENTRICLE RV S prime:     7.18 cm/s LEFT ATRIUM              Index       RIGHT ATRIUM           Index LA diam:        4.80 cm  3.21 cm/m  RA Area:     32.40 cm LA Vol (A2C):   105.0 ml 70.21 ml/m RA Volume:   121.00 ml 80.91 ml/m LA Vol (A4C):   74.2 ml  49.62 ml/m LA Biplane Vol: 90.0 ml  60.18 ml/m   AORTA Ao Root diam: 3.00 cm MITRAL VALVE                        TRICUSPID VALVE MV Area (PHT): 3.77 cm             TR Peak grad:   32.2 mmHg MV PHT:  58.29 msec           TR Vmax:        305.00 cm/s MV Decel Time: 201 msec MV E velocity: 65.10 cm/s 103 cm/s  SHUNTS MV A velocity: 72.40 cm/s 70.3 cm/s Systemic Diam: 2.00 cm MV E/A ratio:  0.90       1.5  Mertie Moores MD Electronically signed by Mertie Moores MD Signature Date/Time: 10/09/2019/3:14:30 PM    Final     Cardiac Studies   Echo 10/09/19 1. Left ventricular ejection fraction, by visual estimation, is 10 %. The left ventricle has severely decreased function. There is no left ventricular hypertrophy.  2. Left ventricular diastolic parameters are consistent with Grade II diastolic dysfunction (pseudonormalization).  3. Severely dilated left ventricular internal cavity size.  4. The left ventricle demonstrates global hypokinesis.  5. Global right ventricle has low normal systolic function.The right ventricular size is normal. No increase in right ventricular wall thickness.  6. Left atrial size was severely dilated.  7. Right atrial size was severely dilated.  8. The mitral valve is normal in structure. Mild to moderate mitral valve regurgitation.  9. The tricuspid valve is grossly normal. 10. The aortic valve is  tricuspid. Aortic valve regurgitation is not visualized. No evidence of aortic valve sclerosis or stenosis. 11. The pulmonic valve was normal in structure. Pulmonic valve regurgitation is trivial. 12. Moderately elevated pulmonary artery systolic pressure. 13. The atrial septum is grossly normal.   Patient Profile     86 y.o. female with pmh of nonischemic cardiomyopathy, chronic systolic CHF, mild coronary disease, hypertension who is being seen for acute CHF. Echo showed new low EF 10% with G2DD, severe LC enlargement, severe biatrial enlargement, mild to moderate MR and mod pulmonary hypertension.  Assessment & Plan    Acute on Chronic systolic CHF - EF newly reduced to 10% - Patient has been on IV lasix 80 mg BID and spiro 12.5 mg daily - Patient has put out 1 L urine in the last 24 hours but none overnight. Stomach is distended. Plan for bladder scan - Weight 127 > 121 lb - creatinine improved, 0.93 > 0.88 - Patient is on 3L O2 and still working to breath. Elevated JVD. Will repeat CXR. Previous CXR with moderate to large pleural effusion. Patient might need thoracentesis if effusion not improving - With EF 10% would consider life vest. Patient has been having short runs of [place]. Can also consider Advanced HF team evaluation.   Cardiomyopathy - EF as above - continue entresto 24-26 mg BID and coreg 3.125 mg BID  HTN - Pressure stable with entresto, coreg spiro  MR - mild to moderate  For questions or updates, please contact [place] Please consult [email or web address] for contact info under   Signed, Cadence Ninfa Meeker, PA-C  10/11/2019, 9:59 AM   As above, pt seen and examined; she continues with dyspnea; no chest pain. I/O-550;  Wt 55.1 kg. Increase lasix to 120 mg BID; follow renal function; hold coreg in setting of acute CHF. Will increase entresto in 24-48 hours if BP allows. May need right thoracentesis. Kirk Ruths

## 2019-10-11 NOTE — Progress Notes (Signed)
patient is sleepy arousal ate breakfast using neck muscles for breaths states that she sometime breath like that, Right JVD upper and lower extremites are cold denies feeling cold with +1 weak pulses, IV lasix ordered 02 sat 97 on 3L RR-26  HR -73 blp 129/72

## 2019-10-12 ENCOUNTER — Inpatient Hospital Stay (HOSPITAL_COMMUNITY): Payer: Medicare PPO

## 2019-10-12 DIAGNOSIS — I509 Heart failure, unspecified: Secondary | ICD-10-CM

## 2019-10-12 DIAGNOSIS — J90 Pleural effusion, not elsewhere classified: Secondary | ICD-10-CM

## 2019-10-12 LAB — BASIC METABOLIC PANEL
Anion gap: 8 (ref 5–15)
BUN: 29 mg/dL — ABNORMAL HIGH (ref 8–23)
CO2: 39 mmol/L — ABNORMAL HIGH (ref 22–32)
Calcium: 9.5 mg/dL (ref 8.9–10.3)
Chloride: 97 mmol/L — ABNORMAL LOW (ref 98–111)
Creatinine, Ser: 0.96 mg/dL (ref 0.44–1.00)
GFR calc Af Amer: >60 mL/min (ref >60)
GFR calc non Af Amer: 55 mL/min — ABNORMAL LOW (ref >60)
Glucose, Bld: 154 mg/dL — ABNORMAL HIGH (ref 70–99)
Potassium: 4.3 mmol/L (ref 3.5–5.1)
Sodium: 144 mmol/L (ref 135–145)

## 2019-10-12 LAB — BLOOD GAS, ARTERIAL
Acid-Base Excess: 12.1 mmol/L — ABNORMAL HIGH (ref 0.0–2.0)
Bicarbonate: 39 mmol/L — ABNORMAL HIGH (ref 20.0–28.0)
Drawn by: 137461
FIO2: 28
O2 Saturation: 99.2 %
Patient temperature: 36.6
pCO2 arterial: 83.7 mmHg (ref 32.0–48.0)
pH, Arterial: 7.288 — ABNORMAL LOW (ref 7.350–7.450)
pO2, Arterial: 165 mmHg — ABNORMAL HIGH (ref 83.0–108.0)

## 2019-10-12 LAB — GLUCOSE, PLEURAL OR PERITONEAL FLUID: Glucose, Fluid: 155 mg/dL

## 2019-10-12 LAB — GLUCOSE, CAPILLARY
Glucose-Capillary: 158 mg/dL — ABNORMAL HIGH (ref 70–99)
Glucose-Capillary: 123 mg/dL — ABNORMAL HIGH (ref 70–99)
Glucose-Capillary: 107 mg/dL — ABNORMAL HIGH (ref 70–99)

## 2019-10-12 LAB — POCT I-STAT 7, (LYTES, BLD GAS, ICA,H+H)
Acid-Base Excess: 12 mmol/L — ABNORMAL HIGH (ref 0.0–2.0)
Bicarbonate: 43.2 mmol/L — ABNORMAL HIGH (ref 20.0–28.0)
Calcium, Ion: 1.33 mmol/L (ref 1.15–1.40)
HCT: 47 % — ABNORMAL HIGH (ref 36.0–46.0)
Hemoglobin: 16 g/dL — ABNORMAL HIGH (ref 12.0–15.0)
O2 Saturation: 96 %
Patient temperature: 98.5
Potassium: 3.8 mmol/L (ref 3.5–5.1)
Sodium: 143 mmol/L (ref 135–145)
TCO2: 46 mmol/L — ABNORMAL HIGH (ref 22–32)
pCO2 arterial: 87.5 mmHg (ref 32.0–48.0)
pH, Arterial: 7.302 — ABNORMAL LOW (ref 7.350–7.450)
pO2, Arterial: 99 mmHg (ref 83.0–108.0)

## 2019-10-12 LAB — MRSA PCR SCREENING: MRSA by PCR: NEGATIVE

## 2019-10-12 LAB — BODY FLUID CELL COUNT WITH DIFFERENTIAL
Eos, Fluid: 0 %
Lymphs, Fluid: 59 %
Monocyte-Macrophage-Serous Fluid: 21 % — ABNORMAL LOW (ref 50–90)
Neutrophil Count, Fluid: 20 % (ref 0–25)
Total Nucleated Cell Count, Fluid: 240 cu mm (ref 0–1000)

## 2019-10-12 LAB — LACTATE DEHYDROGENASE, PLEURAL OR PERITONEAL FLUID: LD, Fluid: 58 U/L — ABNORMAL HIGH (ref 3–23)

## 2019-10-12 LAB — HEPATIC FUNCTION PANEL
ALT: 17 U/L (ref 0–44)
AST: 23 U/L (ref 15–41)
Albumin: 3.3 g/dL — ABNORMAL LOW (ref 3.5–5.0)
Alkaline Phosphatase: 55 U/L (ref 38–126)
Bilirubin, Direct: 0.5 mg/dL — ABNORMAL HIGH (ref 0.0–0.2)
Indirect Bilirubin: 0.7 mg/dL (ref 0.3–0.9)
Total Bilirubin: 1.2 mg/dL (ref 0.3–1.2)
Total Protein: 6 g/dL — ABNORMAL LOW (ref 6.5–8.1)

## 2019-10-12 LAB — LACTATE DEHYDROGENASE: LDH: 177 U/L (ref 98–192)

## 2019-10-12 LAB — PROTEIN, PLEURAL OR PERITONEAL FLUID: Total protein, fluid: <3 g/dL

## 2019-10-12 MED ORDER — METOLAZONE 2.5 MG PO TABS
2.5000 mg | ORAL_TABLET | Freq: Every day | ORAL | Status: DC
  Administered 2019-10-12 – 2019-10-13 (×2): 2.5 mg via ORAL
  Filled 2019-10-12 (×3): qty 1

## 2019-10-12 MED ORDER — ORAL CARE MOUTH RINSE
15.0000 mL | Freq: Two times a day (BID) | OROMUCOSAL | Status: DC
  Administered 2019-10-13 – 2019-10-16 (×7): 15 mL via OROMUCOSAL

## 2019-10-12 MED ORDER — CHLORHEXIDINE GLUCONATE CLOTH 2 % EX PADS
6.0000 | MEDICATED_PAD | Freq: Every day | CUTANEOUS | Status: DC
  Administered 2019-10-12 – 2019-10-16 (×3): 6 via TOPICAL

## 2019-10-12 MED ORDER — DIGOXIN 125 MCG PO TABS
0.1250 mg | ORAL_TABLET | Freq: Every day | ORAL | Status: DC
  Administered 2019-10-12 – 2019-10-14 (×3): 0.125 mg via ORAL
  Filled 2019-10-12 (×4): qty 1

## 2019-10-12 MED ORDER — CHLORHEXIDINE GLUCONATE 0.12 % MT SOLN
15.0000 mL | Freq: Two times a day (BID) | OROMUCOSAL | Status: DC
  Administered 2019-10-12 – 2019-10-16 (×9): 15 mL via OROMUCOSAL
  Filled 2019-10-12 (×7): qty 15

## 2019-10-12 NOTE — Progress Notes (Signed)
Pt becomes more  Lethargic, rapid response RN were called, notified Cardio as well. Day shift nurse la melva on beside.

## 2019-10-12 NOTE — Significant Event (Signed)
Rapid Response Event Note  Overview: Altered mental status in Advanced HF  Initial Focused Assessment: Call received from nursing staff regarding Barbara Kelley and her increased lethargy. I saw her earlier and she would follow commands. Upon arrival, Barbara Kelley was more difficult to arouse but would wake to noxious stimuli. She opened her eyes, followed commands of sticking her tongue out, and holding up two fingers with both hands. CBG WNL. Without stimulation, she will go right back to sleep. Cardiology notified and came to the bedside.  0745-98.0 F, SR w/ BBB 71, 131/92, RR 18-22 on 2L [place] with sats 94%  Interventions: -ABG  Plan of Care (if not transferred): - Cards to evaluate pleural effusion/thoracentesis -LOC may limit our ability to utilize BIPAP due to her inabilility to protect her airway. Possible intubation if LOC declines.  -Re-evaluate GOC  Event Summary: Call 0730 Arrived [phone] Call ended 0800  Madelynn Done

## 2019-10-12 NOTE — Progress Notes (Signed)
Patient is currently Lethargic with response to loud voice, Soft spoken and gestures are appropriate then back to sleep. Vital stable Rapid response at bedside, ABG levels drawn with Critical Lab Co2 levels 83, Patient Transferred to [place] for close monitoring. Report given to [name] patient transported by R//R Team

## 2019-10-12 NOTE — Progress Notes (Signed)
Asked to see pt regarding increased lethargy over last several hours. Ms. Eades would arouse to loud voice and physical touch. She followed simple commands sticking tongue out and showing two fingers on both hands. PERRLA. CBG 156. No focal neuro deficits. Nursing staff is notifying primary svc for further orders.

## 2019-10-12 NOTE — Progress Notes (Signed)
PT Cancellation Note  Patient Details Name: Barbara Kelley MRN: [id number] DOB: 09/16/1934   Cancelled Treatment:    Reason Eval/Treat Not Completed: Medical issues which prohibited therapy. Per chart, pt with increased lethargy and critical lab CO2 levels; transferred to [place]. Will hold for PT evaluation and check back tomorrow.  Mabeline Caras, PT, DPT Acute Rehabilitation Services  Pager [phone] Office [place] 10/12/2019, 9:50 AM

## 2019-10-12 NOTE — Progress Notes (Signed)
RT and MD notified of patient's increasing lethargy. Pt follows commands, opens eyes, and nods to questions. Breaths sounds diminished with questionable absent left breath sounds. MD aware, at bedside. CXR, ABG ordered, Bipap initiated. See new orders.

## 2019-10-12 NOTE — Progress Notes (Addendum)
Panic values reported to RN, attempting to reach CCM now.   Pt is currently more awake compared to when bipap was placed on, is trying to speak.  VSS.   Dr Marva Panda w/ CCM at bedside now and aware of ABG results.    1535- spoke w/ Dr Ruthann Cancer, aware of ABG results. Will continue to monitor, no new RT orders currently.

## 2019-10-12 NOTE — Progress Notes (Signed)
Noted that patient more lethargic now, opens her eyes when called her name then closes her eyes back. She does have more labored breathing but still have diminished lung sound. BP 127/82   Pulse 71   Temp 98 F (36.7 C) (Oral)   Resp 18   Ht 4\' 11"  (1.499 m)   Wt 54.1 kg   SpO2 98%   BMI 24.09 kg/m  Pt just had 200 u/o as of now since start of shift Bladder scan showed 246, offered in and out but pt said she'll try to urinate. Gave some more time.   rapid RN Shanon Brow came to check patient. Recommends monitoring as patient is still follows command. Vs stable. BS 156. Page [employer] MD  Kalman Shan about patient status.  Will continue to monitor.

## 2019-10-12 NOTE — Progress Notes (Addendum)
Progress Note  Patient Name: Barbara Kelley Date of Encounter: 10/12/2019  Primary Cardiologist: Kirk Ruths, MD   Subjective   CTSP due to lethargy; patient appears dyspneic at time of my evaluation.  She opens eyes and shakes head to questions.  She does describe dyspnea but there is no chest pain.  She is lethargic but moves all extremities.  Inpatient Medications    Scheduled Meds: . enoxaparin (LOVENOX) injection  40 mg Subcutaneous Q24H  . pantoprazole  40 mg Oral Daily  . sacubitril-valsartan  1 tablet Oral BID  . sodium chloride flush  3 mL Intravenous Q12H  . spironolactone  12.5 mg Oral Daily   Continuous Infusions: . sodium chloride    . furosemide 120 mg (10/11/19 1937)   PRN Meds: sodium chloride, acetaminophen, nitroGLYCERIN, ondansetron (ZOFRAN) IV, sodium chloride flush   Vital Signs    Vitals:   10/12/19 0733 10/12/19 0738 10/12/19 0740 10/12/19 0745  BP: 111/81 127/85 (!) 128/92 (!) 131/92  Pulse: 77 70 83 71  Resp:    (!) 28  Temp:      TempSrc:      SpO2:    93%  Weight:      Height:        Intake/Output Summary (Last 24 hours) at 10/12/2019 0808 Last data filed at 10/12/2019 0600 Gross per 24 hour  Intake 650 ml  Output 1199 ml  Net -549 ml   Last 3 Weights 10/12/2019 10/11/2019 10/10/2019  Weight (lbs) 119 lb 4.3 oz 121 lb 7.6 oz 127 lb 10.3 oz  Weight (kg) 54.1 kg 55.1 kg 57.9 kg      Telemetry    Sinus rhythm with nonsustained ventricular tachycardia- Personally Reviewed   Physical Exam   GEN:  Lethargic, appears dyspneic.  Saturation 93% on 3 L. Neck: +JVD, supple Cardiac: RRR Respiratory:  Diminished breath sounds bases bilaterally right greater than left. GI: Soft, not tender or distended. MS: 1+ edema Neuro:   Moves all extremities, lethargic, no gross focal findings.   Labs     Chemistry Recent Labs  Lab 10/09/19 1952 10/10/19 0520 10/11/19 0513 10/12/19 0456  NA 141 143 144 144  K 5.0 4.1 3.9 4.3  CL 99 101 97*  97*  CO2 28 30 37* 39*  GLUCOSE 154* 111* 137* 154*  BUN 29* 28* 26* 29*  CREATININE 1.01* 0.93 0.88 0.96  CALCIUM 8.9 8.8* 9.1 9.5  PROT 6.1*  --   --   --   ALBUMIN 3.5  --   --   --   AST 65*  --   --   --   ALT 16  --   --   --   ALKPHOS 65  --   --   --   BILITOT 1.6*  --   --   --   GFRNONAA 51* 57* >60 55*  GFRAA 60* >60 >60 >60  ANIONGAP 14 12 10 8      Hematology Recent Labs  Lab 10/08/19 1446 10/09/19 1246 10/09/19 1952  WBC 8.3 7.6 9.0  RBC 5.18* 4.90 5.13*  HGB 15.3* 14.1 14.6  HCT 47.6* 44.1 46.9*  MCV 91.9 90.0 91.4  MCH 29.5 28.8 28.5  MCHC 32.1 32.0 31.1  RDW 16.1* 15.9* 16.1*  PLT 149* 166 169    BNP Recent Labs  Lab 10/08/19 1446  BNP >4,500.0*     Radiology    DG Chest 2 View  Result Date: 10/11/2019 CLINICAL DATA:  Weakness, shortness of breath EXAM: CHEST - 2 VIEW COMPARISON:  10/08/2019 FINDINGS: Large right pleural effusion with right lower lobe atelectasis or infiltrate. Cardiomegaly. No confluent opacity or effusion on the left. No acute bony abnormality. IMPRESSION: Large right pleural effusion with right lower lobe atelectasis or infiltrate, slightly increasing since prior study. Cardiomegaly. Electronically Signed   By: Rolm Baptise M.D.   On: 10/11/2019 10:54    Cardiac Studies   Echo 10/09/19 1. Left ventricular ejection fraction, by visual estimation, is 10 %. The left ventricle has severely decreased function. There is no left ventricular hypertrophy.  2. Left ventricular diastolic parameters are consistent with Grade II diastolic dysfunction (pseudonormalization).  3. Severely dilated left ventricular internal cavity size.  4. The left ventricle demonstrates global hypokinesis.  5. Global right ventricle has low normal systolic function.The right ventricular size is normal. No increase in right ventricular wall thickness.  6. Left atrial size was severely dilated.  7. Right atrial size was severely dilated.  8. The mitral valve is  normal in structure. Mild to moderate mitral valve regurgitation.  9. The tricuspid valve is grossly normal. 10. The aortic valve is tricuspid. Aortic valve regurgitation is not visualized. No evidence of aortic valve sclerosis or stenosis. 11. The pulmonic valve was normal in structure. Pulmonic valve regurgitation is trivial. 12. Moderately elevated pulmonary artery systolic pressure. 13. The atrial septum is grossly normal.   Patient Profile     86 y.o. female with pmh of nonischemic cardiomyopathy, chronic systolic CHF, mild coronary disease, hypertension who is being seen for acute CHF. Echo showed new low EF 10% with G2DD, severe LC enlargement, severe biatrial enlargement, mild to moderate MR and mod pulmonary hypertension.  Assessment & Plan    1 lethargy-etiology unclear.  Her saturations are 93% on 3 L.  She has not received sedating medications.  Vitals are stable.  Recent CBG with no evidence of hypoglycemia.  Patient may be retaining CO2.  Will check ABG.  Will likely need to be transferred to ICU.  2 acute on chronic systolic congestive heart failure-patient remains volume overloaded.  Continue Lasix and spironolactone.  Add metolazone.  Follow renal function closely.  3 right pleural effusion-personally reviewed chest x-ray.  Large right effusion.  We will arrange thoracentesis as she stabilizes.  4 nonischemic cardiomyopathy-continue Entresto.  No beta-blocker in the setting of acute CHF.  Add digoxin.  5 hypertension-continue present medical regimen.  For questions or updates, please contact [place] Please consult [email or web address] for contact info under  Addendum-ABG shows pH 7.288, PCO2 84, PO2 165 and O2 saturation 99%.  I will ask critical care medicine to evaluate.  Patient may require intubation.  We will treat with BiPAP.  Kirk Ruths, MD   I called the patient's daughter this morning.  She does have a living will that apparently states that she would like  all measures unless physicians deem that her condition is irreversible.  We will not treat with BiPAP due to altered mental status. Transfer to ICU. Kirk Ruths, MD

## 2019-10-12 NOTE — Progress Notes (Signed)
Called to bedside for stat bipap d/t pt lethargic.  Pt does wake up, opens eyes and nods head to questions.  Pt placed on bipap.  MD notified and aware that breath sounds are diminished/? absent over left lower lobe, right breath sounds diminished w/ faint squeaky breath sounds/air movent.    Cxray was ordered.  VSS currently, sat 98%.  RN at bedside and assessed pt.

## 2019-10-12 NOTE — Procedures (Signed)
Thoracentesis Procedure Note  Pre-operative Diagnosis: Pleural effusion  Post-operative Diagnosis: same  Indications: pleural effusion (therapeutic and diagnostic thoracentesis)   Procedure Details  Consent: Informed consent was obtained. Risks of the procedure were discussed including: infection, bleeding, pain, pneumothorax.  Under sterile conditions the patient was positioned. Betadine solution and sterile drapes were utilized.  1% buffered lidocaine was used to anesthetize the 5th rib space. Fluid was obtained without any difficulties and minimal blood loss. No air leak appreciated throughout the procedure.  A dressing was applied to the wound and wound care instructions were provided.   Findings 1500 ml of cloudy pleural fluid was obtained. A sample was sent to Pathology for cytogenetics, flow, and cell counts, as well as for infection analysis.  Complications:  None; patient tolerated the procedure well.          Condition: stable  Plan A follow up chest x-ray was ordered. Fluid sent for cell count w/diff, glucose, LDH, pH and protein levels  Bed Rest for 3 hours. Tylenol 650 mg. for pain.  Harvie Heck, MD Internal Medicine, PGY-1 10/12/2019 12:54 PM

## 2019-10-12 NOTE — Consult Note (Addendum)
PULMONARY / CRITICAL CARE MEDICINE   NAME:  Barbara Kelley, MRN:  [id number], DOB:  [birth year], LOS: 3 ADMISSION DATE:  10/08/2019, CONSULTATION DATE: 10/12/2019 REFERRING MD: Cardiology, CHIEF COMPLAINT: Hypercarbic respiratory failure  BRIEF HISTORY:    86 year old with congestive heart failure refractory to treatment large pleural effusion now with hypercarbic respiratory failure transferred to intensive care unit. HISTORY OF PRESENT ILLNESS   86 year old female with known EF of 20% who presents with increasing respiratory distress volume overload with JVD noted to be extensive is proved refractory to diuresis despite being on a Lasix drip plus Diamox plus Aldactone.  Arterial blood gases drawn on 10/12/2019 revealed a pH of 7.28 PCO2 of 83 and a PO2 of 165 and her CO2 on chemistry this was noted to be 39.  She is noted to be somnolent as and difficult to arouse.  Chest x-ray reveals a large right pleural effusion.  As she is proven refractory to current interventions pulmonary critical care was called urgently on 10/12/2019 for further evaluation and treatment. 10/12/2019 pulmonary critical care to the bedside noted to be somnolent but maintaining adequate respiratory volumes.  Concern of using noninvasive mechanical ventilatory support with her somnolent and having some mild gurgling respirations.  We will transfer her to the intensive care unit.  Considerations at that time will be given towards noninvasive mechanical ventilatory support versus oral tracheal intubation.  Large right pleural effusion consideration will be given to thoracentesis to help expand her lungs to promote adequate ventilation.  She is 86 years old does have an EF of 20% and if she gets intubated she would be difficult to liberate from full mechanical ventilatory support.  Cardiology had extensive conversations with the family they wish to proceed with a code full CODE STATUS at this time.  If she does get intubated improves refractory to  extubation further ongoing discussions will be needed. SIGNIFICANT PAST MEDICAL HISTORY   Congestive heart failure EF of 20% Hypertension Generalized failure to thrive SIGNIFICANT EVENTS:  10/12/2019 transfer to intensive care unit for hypercarbic respiratory failure STUDIES:    CULTURES:    ANTIBIOTICS:    LINES/TUBES:   CONSULTANTS:  10/12/2019 pulmonary SUBJECTIVE:  86 year old female in obvious respiratory distress easily accessory muscles.  Noted to be somnolent.  CONSTITUTIONAL: BP (!) 131/92   Pulse 71   Temp 98 F (36.7 C) (Oral)   Resp (!) 28   Ht 4\' 11"  (1.499 m)   Wt 54.1 kg   SpO2 93%   BMI 24.09 kg/m   I/O last 3 completed shifts: In: 1130 [P.O.:1130] Out: 1599 [Urine:1599]        PHYSICAL EXAM: General: Elderly female is poorly responsive noted to have increased work of breathing. Neuro: Somnolent but does arouse to noxious and loud verbal stimulus HEENT: 3+ JVD Cardiovascular: Heart sounds are distant Lungs: Coarse rhonchi diminished on the right base.  Noted to be tugging and use of accessory muscles. Abdomen: Soft nontender positive bowel sounds Musculoskeletal: Old TKA noted on the right Skin: Warm and dry  RESOLVED PROBLEM LIST   ASSESSMENT AND PLAN   Hypercarbic respiratory failure in setting of congestive heart failure EF of 20% is proven refractory to diuresis along with advanced age and mechanical restriction from kyphoscoliosis complicated by large right pleural effusion.    Transferred to the intensive care unit She may tolerate noninvasive mechanical ventilatory support but there is concern her altered mental status may preclude her to aspiration Consider thoracentesis to improve pulmonary function She  may need intubation Continue diuresis per cardiology Note if she gets intubated this may allow her cardiac function to improve and help with diuresis  Acute on chronic congestive heart failure Hypertension Mitral  regurgitation She is on cardiology service Diuresis as tolerated She is a full code  Failure to thrive 86 year old female with a noted EF 20% who is now on hypercarbic respiratory failure. Continue discussion with family about CODE STATUS      SUMMARY OF TODAY'S PLAN:  86 year old female with a EF of 20% and who was in hypercarbic respiratory failure and transferred to intensive care unit at the request of cardiology and family as she is a full code in acute respiratory distress and a right pleural effusion have improved refractory to diuresis.  Best Practice / Goals of Care / Disposition.   DVT PROPHYLAXIS Heparin SUP: PPI NUTRITION: N.p.o. MOBILITY: Bedrest GOALS OF CARE: Full code FAMILY DISCUSSIONS: Family was updated by cardiology on 10/12/2019 and they have requested full code DISPOSITION 10/11/2018 when transferred to intensive care unit  LABS  Glucose Recent Labs  Lab 10/12/19 0412  GLUCAP 158*    BMET Recent Labs  Lab 10/10/19 0520 10/11/19 0513 10/12/19 0456  NA 143 144 144  K 4.1 3.9 4.3  CL 101 97* 97*  CO2 30 37* 39*  BUN 28* 26* 29*  CREATININE 0.93 0.88 0.96  GLUCOSE 111* 137* 154*    Liver Enzymes Recent Labs  Lab 10/09/19 1952  AST 65*  ALT 16  ALKPHOS 65  BILITOT 1.6*  ALBUMIN 3.5    Electrolytes Recent Labs  Lab 10/09/19 1246 10/10/19 0520 10/11/19 0513 10/12/19 0456  CALCIUM  --  8.8* 9.1 9.5  MG 2.2  --   --   --     CBC Recent Labs  Lab 10/08/19 1446 10/09/19 1246 10/09/19 1952  WBC 8.3 7.6 9.0  HGB 15.3* 14.1 14.6  HCT 47.6* 44.1 46.9*  PLT 149* 166 169    ABG Recent Labs  Lab 10/12/19 0746  PHART 7.288*  PCO2ART 83.7*  PO2ART 165*    Coag's No results for input(s): APTT, INR in the last 168 hours.  Sepsis Markers Recent Labs  Lab 10/09/19 1912 10/09/19 2331  LATICACIDVEN 1.2 1.0    Cardiac Enzymes No results for input(s): TROPONINI, PROBNP in the last 168 hours.  PAST MEDICAL HISTORY :   She   has a past medical history of Arthritis, Cardiomyopathy ([place]), Congestive heart failure (CHF) ([place]), Dyspnea, GERD (gastroesophageal reflux disease), Hypertension, Neuropathy, Pneumonia, PONV (postoperative nausea and vomiting), and Stroke ([place]) (2008).  PAST SURGICAL HISTORY:  She  has a past surgical history that includes Abdominal hysterectomy; Joint replacement (Left, 2013); Appendectomy; Cholecystectomy; Tonsillectomy; Tubal ligation; Bladder suspension; Cardiac catheterization (10/2015); Total knee arthroplasty (Left, 05/04/2017); Hemiarthroplasty hip (Right, 10/20/2017); and Hip Arthroplasty (Right, 10/19/2017).  Allergies  Allergen Reactions  . Doxycycline Nausea Only    Vertigo  . Macrobid [Nitrofurantoin] Other (See Comments)    Nerve pain  . Valsartan Other (See Comments)    Blood pressure rise  . Lisinopril Cough  . Calcium Channel Blockers Palpitations    Blow pressure rise / tachycardia   . Penicillins Rash    Has patient had a PCN reaction causing immediate rash, facial/tongue/throat swelling, SOB or lightheadedness with hypotension: Yes Has patient had a PCN reaction causing severe rash involving mucus membranes or skin necrosis: Yes Has patient had a PCN reaction that required hospitalization: Unk Has patient had a PCN reaction occurring  within the last 10 years: No If all of the above answers are "NO", then may proceed with Cephalosporin use.     No current facility-administered medications on file prior to encounter.   Current Outpatient Medications on File Prior to Encounter  Medication Sig  . b complex vitamins tablet Take 1 tablet by mouth daily.  . Calcium Carbonate (CALCIUM 600 PO) Take 3 tablets by mouth daily.  . carvedilol (COREG) 3.125 MG tablet Take 1 tablet (3.125 mg total) by mouth 2 (two) times daily with a meal.  . Cholecalciferol (VITAMIN D HIGH POTENCY) 25 MCG (1000 UT) capsule Take 1,000 Units by mouth daily.  . Coenzyme Q10 (COQ10) 200 MG CAPS Take  1 capsule by mouth daily.  . furosemide (LASIX) 40 MG tablet Take 2 tablets (80 mg total) by mouth 2 (two) times daily.  Marland Kitchen GINKGO BILOBA PO Take 1 capsule by mouth 2 (two) times daily.  . Multiple Vitamins-Minerals (MULTIVITAMIN WITH MINERALS) tablet Take 1 tablet by mouth daily.  Marland Kitchen omeprazole (PRILOSEC) 20 MG capsule Take 1 capsule (20 mg total) by mouth daily.  . Probiotic Product (PROBIOTIC PO) Take 1 tablet by mouth daily.  . sacubitril-valsartan (ENTRESTO) 24-26 MG Take 1 tablet by mouth 2 (two) times daily.    FAMILY HISTORY:   Her family history includes Diabetes in her mother and sister; Lung cancer in her father; Ovarian cancer in her mother; Stroke in her maternal grandfather.  SOCIAL HISTORY:  She  reports that she has never smoked. She has never used smokeless tobacco. She reports that she does not drink alcohol or use drugs.  REVIEW OF SYSTEMS:    Na   App cct 45 min  Richardson Landry Darlys Buis ACNP Acute Care Nurse Practitioner [place] Please consult [place] 10/12/2019, 9:36 AM

## 2019-10-12 NOTE — Progress Notes (Signed)
CCM and Cardiology made aware of patient bradycardia high 40's to low 50's. BP stable, on BiPAP. No new orders, verbal order given to continue to monitor patient and page with any rhythm changes or patient deterioration.

## 2019-10-12 NOTE — Significant Event (Signed)
Rapid Response Event Note  Overview: Time Called: 0730 Arrival Time: 0735 Event Type: (Decreased LOC.)  Initial Focused Assessment: Per nursing staff pt has become progressively more lethargic and harder to arouse throughout the morning. Pt minimally and inconsistently arouses to voice. Patient does arouse to pain with eye opening- pt does not remain awake without stimulation. Pt is able to follow simple commands. JVD noted. Accessory muscle use noted in patient's neck, chest, and abdomen. UOP decreased. Crackles heard in right upper and lower lobe. Pt noted to have large right pleural effusion with right lower lobe atelectasis or infiltrate on yesterday (01/06) mornings chest xray. Pt has weak cough.   Interventions: ABG obtained Dr. [name] at bedside to assess patient. Gaylyn Lambert, NP with PCCM at bedside to assess the patient. Orders for patient to be transferred.  Waiting to consider BiPAP at this time due to pt's LOC.   Plan of Care (if not transferred): Transfer orders written for ICU. I remained with patient until transfer and accompanied transfer of patient to 2H15.   Event Summary: Name of Physician Notified: Dr. [name] at [facility]    at    Outcome: Transferred (Comment)(Orders written for transfer to ICU)  Event End Time: [place]

## 2019-10-12 NOTE — Progress Notes (Signed)
Spoke to Dr Marletta Lor, cards fellow, re entresto po scheduled for tonight. Pt remains on BiPAP with waxing and waning mental status (currently oriented to person and place but remains drowsy), will follow mental status and ok to hold pm entresto if pt status not appropriate for safe PO intake d/t BiPAP and mental status.

## 2019-10-13 DIAGNOSIS — Z9889 Other specified postprocedural states: Secondary | ICD-10-CM

## 2019-10-13 DIAGNOSIS — R0602 Shortness of breath: Secondary | ICD-10-CM

## 2019-10-13 LAB — BASIC METABOLIC PANEL
Anion gap: 13 (ref 5–15)
BUN: 26 mg/dL — ABNORMAL HIGH (ref 8–23)
CO2: 41 mmol/L — ABNORMAL HIGH (ref 22–32)
Calcium: 9.3 mg/dL (ref 8.9–10.3)
Chloride: 92 mmol/L — ABNORMAL LOW (ref 98–111)
Creatinine, Ser: 0.88 mg/dL (ref 0.44–1.00)
GFR calc Af Amer: >60 mL/min (ref >60)
GFR calc non Af Amer: >60 mL/min (ref >60)
Glucose, Bld: 102 mg/dL — ABNORMAL HIGH (ref 70–99)
Potassium: 3.6 mmol/L (ref 3.5–5.1)
Sodium: 146 mmol/L — ABNORMAL HIGH (ref 135–145)

## 2019-10-13 LAB — PH, BODY FLUID: pH, Body Fluid: 7.9

## 2019-10-13 LAB — CYTOLOGY - NON PAP

## 2019-10-13 MED ORDER — CARVEDILOL 3.125 MG PO TABS
3.1250 mg | ORAL_TABLET | Freq: Two times a day (BID) | ORAL | Status: DC
  Administered 2019-10-13 – 2019-10-14 (×3): 3.125 mg via ORAL
  Filled 2019-10-13 (×4): qty 1

## 2019-10-13 MED ORDER — POTASSIUM CHLORIDE 10 MEQ/100ML IV SOLN
10.0000 meq | INTRAVENOUS | Status: AC
  Administered 2019-10-13 (×4): 10 meq via INTRAVENOUS
  Filled 2019-10-13 (×5): qty 100

## 2019-10-13 NOTE — Progress Notes (Signed)
Pt arouses to verbal with sustained eye contact and more interactive, answers questions appropriately, oriented to self/situation/location but remains disoriented to time/date. Sats stable on BiPAP. Voiding well, cleaned of incontinence and purewick repositioned. Denies pain or needs.

## 2019-10-13 NOTE — Progress Notes (Signed)
Progress Note  Patient Name: Barbara Kelley Date of Encounter: 10/13/2019  Primary Cardiologist: Kirk Ruths, MD   Subjective   Much more alert this AM; still dyspneic but improved; no CP; complains of RLE pain  Inpatient Medications    Scheduled Meds: . chlorhexidine  15 mL Mouth Rinse BID  . Chlorhexidine Gluconate Cloth  6 each Topical Daily  . digoxin  0.125 mg Oral Daily  . enoxaparin (LOVENOX) injection  40 mg Subcutaneous Q24H  . mouth rinse  15 mL Mouth Rinse q12n4p  . metolazone  2.5 mg Oral Daily  . pantoprazole  40 mg Oral Daily  . sacubitril-valsartan  1 tablet Oral BID  . sodium chloride flush  3 mL Intravenous Q12H  . spironolactone  12.5 mg Oral Daily   Continuous Infusions: . sodium chloride Stopped (10/13/19 0537)  . furosemide 120 mg (10/12/19 1946)  . potassium chloride 100 mL/hr at 10/13/19 0700   PRN Meds: sodium chloride, acetaminophen, nitroGLYCERIN, ondansetron (ZOFRAN) IV, sodium chloride flush   Vital Signs    Vitals:   10/13/19 0500 10/13/19 0600 10/13/19 0700 10/13/19 0721  BP: (!) 125/108 (!) 83/53 128/63 128/63  Pulse: 88 (!) 58 (!) 55 (!) 59  Resp: (!) 21 12 17 14   Temp:      TempSrc:      SpO2: 97% 98% 100%   Weight: 50.6 kg     Height:        Intake/Output Summary (Last 24 hours) at 10/13/2019 0732 Last data filed at 10/13/2019 0700 Gross per 24 hour  Intake 482.25 ml  Output 3700 ml  Net -3217.75 ml   Last 3 Weights 10/13/2019 10/12/2019 10/11/2019  Weight (lbs) 111 lb 8.8 oz 119 lb 4.3 oz 121 lb 7.6 oz  Weight (kg) 50.6 kg 54.1 kg 55.1 kg      Telemetry    Sinus rhythm with PACs, PVCs and 15 beats nonsustained ventricular tachycardia- Personally Reviewed   Physical Exam   GEN: On Bipap; answers questions appropriately Neck: + JVD Cardiac: RRR, no gallop Respiratory:  Diminished breath sounds bases bilaterally GI: Soft, NT/ND, umbilical hernia. MS: trace to 1+ edema; erythematous bilaterally; chronic skin changes  Neuro:   No gross findings   Labs     Chemistry Recent Labs  Lab 10/09/19 1952 10/11/19 0513 10/12/19 0456 10/12/19 1427 10/12/19 1524 10/13/19 0234  NA 141 144 144  --  143 146*  K 5.0 3.9 4.3  --  3.8 3.6  CL 99 97* 97*  --   --  92*  CO2 28 37* 39*  --   --  41*  GLUCOSE 154* 137* 154*  --   --  102*  BUN 29* 26* 29*  --   --  26*  CREATININE 1.01* 0.88 0.96  --   --  0.88  CALCIUM 8.9 9.1 9.5  --   --  9.3  PROT 6.1*  --   --  6.0*  --   --   ALBUMIN 3.5  --   --  3.3*  --   --   AST 65*  --   --  23  --   --   ALT 16  --   --  17  --   --   ALKPHOS 65  --   --  55  --   --   BILITOT 1.6*  --   --  1.2  --   --   GFRNONAA 51* >60 55*  --   --  >  60  GFRAA 60* >60 >60  --   --  >60  ANIONGAP 14 10 8   --   --  13     Hematology Recent Labs  Lab 10/08/19 1446 10/09/19 1246 10/09/19 1952 10/12/19 1524  WBC 8.3 7.6 9.0  --   RBC 5.18* 4.90 5.13*  --   HGB 15.3* 14.1 14.6 16.0*  HCT 47.6* 44.1 46.9* 47.0*  MCV 91.9 90.0 91.4  --   MCH 29.5 28.8 28.5  --   MCHC 32.1 32.0 31.1  --   RDW 16.1* 15.9* 16.1*  --   PLT 149* 166 169  --     BNP Recent Labs  Lab 10/08/19 1446  BNP >4,500.0*     Radiology    DG Chest 1 View  Result Date: 10/12/2019 CLINICAL DATA:  Post thoracentesis EXAM: CHEST  1 VIEW COMPARISON:  10/11/2019 FINDINGS: Significant improvement in right pleural effusion which is now small. No pneumothorax. Improved aeration right lung base. Cardiac enlargement. Negative for edema. Mild left lower lobe atelectasis. IMPRESSION: No complication post right thoracentesis. Small residual pleural effusion. Improved aeration right lung base. Electronically Signed   By: Franchot Gallo M.D.   On: 10/12/2019 13:41   DG Chest 2 View  Result Date: 10/11/2019 CLINICAL DATA:  Weakness, shortness of breath EXAM: CHEST - 2 VIEW COMPARISON:  10/08/2019 FINDINGS: Large right pleural effusion with right lower lobe atelectasis or infiltrate. Cardiomegaly. No confluent  opacity or effusion on the left. No acute bony abnormality. IMPRESSION: Large right pleural effusion with right lower lobe atelectasis or infiltrate, slightly increasing since prior study. Cardiomegaly. Electronically Signed   By: Rolm Baptise M.D.   On: 10/11/2019 10:54   DG CHEST PORT 1 VIEW  Result Date: 10/12/2019 CLINICAL DATA:  Absent breath sounds on left side of chest. EXAM: PORTABLE CHEST 1 VIEW COMPARISON:  Chest x-ray from same day. FINDINGS: Unchanged cardiomegaly. Normal pulmonary vascularity. Unchanged small right pleural effusion and mild bibasilar atelectasis. No pneumothorax. No acute osseous abnormality. IMPRESSION: 1. Unchanged small right pleural effusion and mild bibasilar atelectasis. Electronically Signed   By: Titus Dubin M.D.   On: 10/12/2019 14:58    Cardiac Studies   Echo 10/09/19 1. Left ventricular ejection fraction, by visual estimation, is 10 %. The left ventricle has severely decreased function. There is no left ventricular hypertrophy.  2. Left ventricular diastolic parameters are consistent with Grade II diastolic dysfunction (pseudonormalization).  3. Severely dilated left ventricular internal cavity size.  4. The left ventricle demonstrates global hypokinesis.  5. Global right ventricle has low normal systolic function.The right ventricular size is normal. No increase in right ventricular wall thickness.  6. Left atrial size was severely dilated.  7. Right atrial size was severely dilated.  8. The mitral valve is normal in structure. Mild to moderate mitral valve regurgitation.  9. The tricuspid valve is grossly normal. 10. The aortic valve is tricuspid. Aortic valve regurgitation is not visualized. No evidence of aortic valve sclerosis or stenosis. 11. The pulmonic valve was normal in structure. Pulmonic valve regurgitation is trivial. 12. Moderately elevated pulmonary artery systolic pressure. 13. The atrial septum is grossly normal.   Patient Profile      86 y.o. female with pmh of nonischemic cardiomyopathy, chronic systolic CHF, mild coronary disease, hypertension who is being seen for acute CHF. Echo showed new low EF 10% with G2DD, severe LC enlargement, severe biatrial enlargement, mild to moderate MR and mod pulmonary hypertension.  Assessment &  Plan    1 lethargy-much improved this morning.  Respiratory status improved.  Previous decreased mental status felt secondary to CO2 narcosis.  Wean BiPAP as tolerated.  2 acute on chronic systolic congestive heart failure-I/O-3217. Wt 50.6 Kg.  Volume status improving.  We will continue present dose of Lasix, metolazone and spironolactone today.  Will likely discontinue metolazone in the morning.  Follow renal function closely.  3 right pleural effusion-status post thoracentesis.  Improved.  Await cultures.  4 nonischemic cardiomyopathy-continue Entresto.  Digoxin initiated yesterday.  We will add beta-blocker later as acute CHF improves.  5 hypertension-continue present medical regimen.  For questions or updates, please contact [facility] [facility] Please consult [email or web address] for contact info under

## 2019-10-13 NOTE — Evaluation (Signed)
Physical Therapy Evaluation Patient Details Name: Barbara Kelley MRN: [id number] DOB: 11-06-1933 Today's Date: 10/13/2019   History of Present Illness  86 year old with congestive heart failure refractory to treatment large pleural effusion now with hypercarbic respiratory failure transferred to intensive care unit. PMH includes arthritis, cardiomyopathy, CHF, GERD, HTN, PNA, and stroke.  Clinical Impression  Pt presents to PT with deficits in gait, balance, functional mobility, balance, endurance, power, and cognition. Pt is confused and an unreliable historian, disoriented to time. Pt requires physical assistance for all functional mobility at this time. Pt tolerating limited mobility assessment well on BiPAP, with one questionable period of desaturation on monitor but pt asymptomatic. Pt will benefit from continued acute PT services to improve activity tolerance and to reduce falls risk.    Follow Up Recommendations SNF;Supervision/Assistance - 24 hour(may progress to home level with reduced oxygen needs)    Equipment Recommendations  (defer to post-acute setting)    Recommendations for Other Services       Precautions / Restrictions Precautions Precautions: Fall Restrictions Weight Bearing Restrictions: No      Mobility  Bed Mobility Overal bed mobility: Needs Assistance Bed Mobility: Supine to Sit;Sit to Supine     Supine to sit: Mod assist Sit to supine: Max assist      Transfers Overall transfer level: Needs assistance Equipment used: 1 person hand held assist Transfers: Sit to/from Stand Sit to Stand: Min assist            Ambulation/Gait Ambulation/Gait assistance: Min assist Gait Distance (Feet): 0 Feet Assistive device: 1 person hand held assist Gait Pattern/deviations: Step-to pattern Gait velocity: reduced Gait velocity interpretation: <1.31 ft/sec, indicative of household ambulator General Gait Details: pt takes 2 steps in place, clearing both feet  from floor well without LOB. PT declines further attempts at leaving bedside due to desaturation to 83% (unreliable due to poor wave form)  Stairs            Wheelchair Mobility    Modified Rankin (Stroke Patients Only)       Balance Overall balance assessment: Needs assistance Sitting-balance support: Bilateral upper extremity supported;Feet unsupported Sitting balance-Leahy Scale: Fair Sitting balance - Comments: minG   Standing balance support: Single extremity supported Standing balance-Leahy Scale: Poor Standing balance comment: minA with L Hand hold                             Pertinent Vitals/Pain Pain Assessment: No/denies pain    Home Living Family/patient expects to be discharged to:: Private residence Living Arrangements: Spouse/significant other;Children Available Help at Discharge: Family;Available 24 hours/day Type of Home: House Home Access: Stairs to enter Entrance Stairs-Rails: Can reach both Entrance Stairs-Number of Steps: (pt unable to report number of stairs) Home Layout: One level Home Equipment: Walker - 4 wheels Additional Comments: pt is confused, possibly an unreliable historian    Prior Function Level of Independence: Independent with assistive device(s)         Comments: ambulates with Rollator     Hand Dominance   Dominant Hand: Right    Extremity/Trunk Assessment   Upper Extremity Assessment Upper Extremity Assessment: Generalized weakness    Lower Extremity Assessment Lower Extremity Assessment: Generalized weakness    Cervical / Trunk Assessment Cervical / Trunk Assessment: Kyphotic  Communication   Communication: Other (comment)(BiPAP)  Cognition Arousal/Alertness: Awake/alert Behavior During Therapy: WFL for tasks assessed/performed Overall Cognitive Status: No family/caregiver present to determine baseline cognitive functioning  Area of Impairment: Orientation;Memory;Problem solving                  Orientation Level: Disoriented to;Time   Memory: Decreased recall of precautions;Decreased short-term memory       Problem Solving: Slow processing;Requires verbal cues        General Comments General comments (skin integrity, edema, etc.): pt on BiPAP, desats to 83% with OOB activity but unreliable wave form. pt denies symptoms during session    Exercises     Assessment/Plan    PT Assessment Patient needs continued PT services  PT Problem List Decreased strength;Decreased activity tolerance;Decreased balance;Decreased mobility;Decreased cognition;Decreased safety awareness;Decreased knowledge of use of DME;Decreased knowledge of precautions;Cardiopulmonary status limiting activity;Impaired sensation       PT Treatment Interventions DME instruction;Gait training;Stair training;Functional mobility training;Therapeutic activities;Therapeutic exercise;Balance training;Neuromuscular re-education;Patient/family education;Wheelchair mobility training    PT Goals (Current goals can be found in the Care Plan section)  Acute Rehab PT Goals Patient Stated Goal: To return to baseline PT Goal Formulation: With patient Time For Goal Achievement: 10/27/19 Potential to Achieve Goals: Fair    Frequency Min 2X/week   Barriers to discharge        Co-evaluation               AM-PAC PT "6 Clicks" Mobility  Outcome Measure Help needed turning from your back to your side while in a flat bed without using bedrails?: A Little Help needed moving from lying on your back to sitting on the side of a flat bed without using bedrails?: A Lot Help needed moving to and from a bed to a chair (including a wheelchair)?: A Little Help needed standing up from a chair using your arms (e.g., wheelchair or bedside chair)?: A Little Help needed to walk in hospital room?: A Lot Help needed climbing 3-5 steps with a railing? : Total 6 Click Score: 14    End of Session Equipment Utilized During  Treatment: Oxygen Activity Tolerance: Patient tolerated treatment well Patient left: in bed;with call bell/phone within reach;with bed alarm set Nurse Communication: Mobility status PT Visit Diagnosis: Muscle weakness (generalized) (M62.81)    Time: 0800-0820 PT Time Calculation (min) (ACUTE ONLY): 20 min   Charges:   PT Evaluation $PT Eval High Complexity: 1 High          Zenaida Niece, PT, DPT Acute Rehabilitation Pager: [phone]   Zenaida Niece 10/13/2019, 8:57 AM

## 2019-10-13 NOTE — Progress Notes (Signed)
Remains on BiPAP. Did tolerate about 10 min on n/c at approx 2115 for oral care but dyspnea with conversation noted and placed back on BiPAP. Remains drowsy but follows commands with delayed response. Holding entresto per earlier discussion with Dr Marletta Lor.

## 2019-10-13 NOTE — Progress Notes (Signed)
PULMONARY / CRITICAL CARE MEDICINE   NAME:  Barbara Kelley, MRN:  [id number], DOB:  Mar 12, 1934, LOS: 4 ADMISSION DATE:  10/08/2019, CONSULTATION DATE: 10/12/2019 REFERRING MD: Cardiology, CHIEF COMPLAINT: Hypercarbic respiratory failure  BRIEF HISTORY:    86 year old with congestive heart failure refractory to treatment large pleural effusion now with hypercarbic respiratory failure transferred to intensive care unit. HISTORY OF PRESENT ILLNESS   86 year old female with known EF of 20% who presents with increasing respiratory distress volume overload with JVD noted to be extensive is proved refractory to diuresis despite being on a Lasix drip plus Diamox plus Aldactone. Arterial blood gases drawn on 10/12/2019 revealed a pH of 7.28 PCO2 of 83 and a PO2 of 165 and her CO2 on chemistry this was noted to be 39.  She is noted to be somnolent as and difficult to arouse.  Chest x-ray reveals a large right pleural effusion.  As she is proven refractory to current interventions pulmonary critical care was called urgently on 10/12/2019 for further evaluation and treatment. 10/12/2019 pulmonary critical care to the bedside noted to be somnolent but maintaining adequate respiratory volumes.  Concern of using noninvasive mechanical ventilatory support with her somnolent and having some mild gurgling respirations.  We will transfer her to the intensive care unit.  Considerations at that time will be given towards noninvasive mechanical ventilatory support versus oral tracheal intubation.  Large right pleural effusion consideration will be given to thoracentesis to help expand her lungs to promote adequate ventilation.  She is 86 years old does have an EF of 20% and if she gets intubated she would be difficult to liberate from full mechanical ventilatory support.  Cardiology had extensive conversations with the family they wish to proceed with a code full CODE STATUS at this time.  If she does get intubated improves refractory to  extubation further ongoing discussions will be needed. SIGNIFICANT PAST MEDICAL HISTORY   Congestive heart failure EF of 20% Hypertension Generalized failure to thrive SIGNIFICANT EVENTS:  10/12/2019 transfer to intensive care unit for hypercarbic respiratory failure 10/12/2019>> Thoracentesis 1500 cc's cloudy Pleural Fluid>>sample was sent to Pathology for cytogenetics, flow, and cell counts, as well as for infection analysis  STUDIES:   Echo 10/09/2019 Left Ventricle: Left ventricular ejection fraction, by visual estimation, is 10 %%. The left ventricle has severely decreased function. The left ventricle demonstrates global hypokinesis. The left ventricular internal cavity size was severely dilated left ventricle. There is no left ventricular hypertrophy. Left ventricular diastolic parameters are consistent with Grade II diastolic dysfunction (pseudonormalization). Right Ventricle: The right ventricular size is normal. No increase in right ventricular wall thickness. Global RV systolic function is has low normal systolic function. The tricuspid regurgitant velocity is 2.84 m/s, and with an assumed right atrial pressure of 15 mmHg, the estimated right ventricular systolic pressure is moderately elevated at 47.2 mmHg. Left Atrium: Left atrial size was severely dilated. Right Atrium: Right atrial size was severely dilated   Pleural Fluid 10/12/2019 Results for JAHLEAH, DEVANY (MRN [id number]) as of 10/13/2019 10:31  Ref. Range 10/12/2019 12:54  Monocyte-Macrophage-Serous Fluid Latest Ref Range: 50 - 90 % 21 (L)  Other Cells, Fluid Latest Units: % RARE MESOTHELIAL CELL PRESENT.  Glucose, Fluid Latest Units: mg/dL 155  Fluid Type-FGLU Unknown PLEU RIGHT  Fluid Type-FLDH Unknown PLEU RIGHT  LD, Fluid Latest Ref Range: 3 - 23 U/L 58 (H)  Total protein, fluid Latest Units: g/dL <3.0  Fluid Type-FCT Unknown PLEU RIGHT  Fluid Type-FTP Unknown PLEU  RIGHT  Color, Fluid Latest Ref Range: YELLOW  YELLOW    Total Nucleated Cell Count, Fluid Latest Ref Range: 0 - 1,000 cu mm 240  Lymphs, Fluid Latest Units: % 59  Eos, Fluid Latest Units: % 0  Appearance, Fluid Latest Ref Range: CLEAR  HAZY (A)  Neutrophil Count, Fluid Latest Ref Range: 0 - 25 % 20   CULTURES:  1/7 Pleural Fluid>>  ANTIBIOTICS:  None  LINES/TUBES:   CONSULTANTS:  10/12/2019 pulmonary SUBJECTIVE:  86 year old female in NAD in bed off BiPAP post thoracentesis and on 3L [place] with sats of 97%. States she feels much better today on [place].   CONSTITUTIONAL: BP 128/63   Pulse 84   Temp 98.2 F (36.8 C) (Oral)   Resp 14   Ht 4\' 11"  (1.499 m)   Wt 50.6 kg   SpO2 100%   BMI 22.53 kg/m   I/O last 3 completed shifts: In: 532.3 [P.O.:50; I.V.:78.8; IV Piggyback:403.5] Out: 4050 [Urine:4050]     FiO2 (%):  [30 %] 30 %  PHYSICAL EXAM: General: Elderly female is awake and alert and oriented, in NAD Neuro: Awake and alert and oriented, MAE x 4, Appropriate HEENT: 3+ JVD Cardiovascular: S1, S2, RRR, Heart sounds are distant Lungs: Bilateral chest excursion, Coarse rhonchi . Normal work of breathing Abdomen: Soft non tender, non distended, positive bowel sounds Musculoskeletal: Old TKA noted on the right Skin: Warm and dry, red and warm to touch per lower extremities  RESOLVED PROBLEM LIST   ASSESSMENT AND PLAN   Hypercarbic respiratory failure in setting of congestive heart failure EF of 20% is proven refractory to diuresis along with advanced age and mechanical restriction from kyphoscoliosis complicated by large right pleural effusion.  Thoracentesis 1/7 with 1500 cc cloudy pleural fluid drained Treated with BiPap overnight with decrease in WOB and improved sats Looks much better overnight Plan Try off BiPAP Wean oxygen as able for below sats Saturation goals are > 94% Diuresis per primary team   Acute on chronic congestive heart failure Hypertension Mitral regurgitation She is on cardiology  service Diuresis as tolerated Strict I&O   Failure to thrive 86 year old female with a noted EF 20% who is now on hypercarbic respiratory failure. Continue discussion with family about CODE STATUS and ongoing goals of care   SUMMARY OF TODAY'S PLAN:  Better after Thoracentesis 1/7 and BiPAP x 24 hours. Now trying off BiPAP, weaning to [place]>> Tolerating 3 L well with good sats. If continues to do well, we can reassess this afternoon and consider transfer to progressive bed  Best Practice / Goals of Care / Disposition.   DVT PROPHYLAXIS Heparin SUP: PPI NUTRITION: N.p.o. MOBILITY: Bedrest GOALS OF CARE: Full code FAMILY DISCUSSIONS: Family was updated by cardiology on 10/13/2019 and they have requested full code DISPOSITION 10/11/2018 when transferred to intensive care unit  LABS  Glucose Recent Labs  Lab 10/12/19 0412 10/12/19 0739 10/12/19 1418  GLUCAP 158* 123* 107*    BMET Recent Labs  Lab 10/11/19 0513 10/12/19 0456 10/12/19 1524 10/13/19 0234  NA 144 144 143 146*  K 3.9 4.3 3.8 3.6  CL 97* 97*  --  92*  CO2 37* 39*  --  41*  BUN 26* 29*  --  26*  CREATININE 0.88 0.96  --  0.88  GLUCOSE 137* 154*  --  102*    Liver Enzymes Recent Labs  Lab 10/09/19 1952 10/12/19 1427  AST 65* 23  ALT 16 17  ALKPHOS 65 55  BILITOT 1.6* 1.2  ALBUMIN 3.5 3.3*    Electrolytes Recent Labs  Lab 10/09/19 1246 10/11/19 0513 10/12/19 0456 10/13/19 0234  CALCIUM  --  9.1 9.5 9.3  MG 2.2  --   --   --     CBC Recent Labs  Lab 10/08/19 1446 10/09/19 1246 10/09/19 1952 10/12/19 1524  WBC 8.3 7.6 9.0  --   HGB 15.3* 14.1 14.6 16.0*  HCT 47.6* 44.1 46.9* 47.0*  PLT 149* 166 169  --     ABG Recent Labs  Lab 10/12/19 0746 10/12/19 1524  PHART 7.288* 7.302*  PCO2ART 83.7* 87.5*  PO2ART 165* 99.0    Coag's No results for input(s): APTT, INR in the last 168 hours.  Sepsis Markers Recent Labs  Lab 10/09/19 1912 10/09/19 2331  LATICACIDVEN 1.2 1.0     Cardiac Enzymes No results for input(s): TROPONINI, PROBNP in the last 168 hours.  PAST MEDICAL HISTORY :   She  has a past medical history of Arthritis, Cardiomyopathy ([place]), Congestive heart failure (CHF) ([place]), Dyspnea, GERD (gastroesophageal reflux disease), Hypertension, Neuropathy, Pneumonia, PONV (postoperative nausea and vomiting), and Stroke ([place]) (2008).  PAST SURGICAL HISTORY:  She  has a past surgical history that includes Abdominal hysterectomy; Joint replacement (Left, 2013); Appendectomy; Cholecystectomy; Tonsillectomy; Tubal ligation; Bladder suspension; Cardiac catheterization (10/2015); Total knee arthroplasty (Left, 05/04/2017); Hemiarthroplasty hip (Right, 10/20/2017); and Hip Arthroplasty (Right, 10/19/2017).  Allergies  Allergen Reactions  . Doxycycline Nausea Only    Vertigo  . Macrobid [Nitrofurantoin] Other (See Comments)    Nerve pain  . Valsartan Other (See Comments)    Blood pressure rise  . Lisinopril Cough  . Calcium Channel Blockers Palpitations    Blow pressure rise / tachycardia   . Penicillins Rash    Has patient had a PCN reaction causing immediate rash, facial/tongue/throat swelling, SOB or lightheadedness with hypotension: Yes Has patient had a PCN reaction causing severe rash involving mucus membranes or skin necrosis: Yes Has patient had a PCN reaction that required hospitalization: Unk Has patient had a PCN reaction occurring within the last 10 years: No If all of the above answers are "NO", then may proceed with Cephalosporin use.     No current facility-administered medications on file prior to encounter.   Current Outpatient Medications on File Prior to Encounter  Medication Sig  . b complex vitamins tablet Take 1 tablet by mouth daily.  . calcium carbonate (CALCIUM 600) 600 MG TABS tablet Take 600 mg by mouth at bedtime.   . carvedilol (COREG) 3.125 MG tablet Take 1 tablet (3.125 mg total) by mouth 2 (two) times daily with a meal.  .  Cholecalciferol (VITAMIN D HIGH POTENCY) 25 MCG (1000 UT) capsule Take 1,000 Units by mouth daily.  . Coenzyme Q10 (COQ10) 200 MG CAPS Take 1 capsule by mouth daily.  . fluticasone (FLONASE) 50 MCG/ACT nasal spray Place 1-2 sprays into the nose daily as needed for allergies or congestion.  . furosemide (LASIX) 40 MG tablet Take 2 tablets (80 mg total) by mouth 2 (two) times daily.  Marland Kitchen GINKGO BILOBA PO Take 1 capsule by mouth daily.   . Multiple Vitamins-Minerals (MULTIVITAMIN WITH MINERALS) tablet Take 1 tablet by mouth daily.  Marland Kitchen OVER THE COUNTER MEDICATION Take 1 tablet by mouth daily. Starts with a "P" for memory  . sacubitril-valsartan (ENTRESTO) 24-26 MG Take 1 tablet by mouth 2 (two) times daily.  Marland Kitchen zinc gluconate 50 MG tablet Take 50 mg by mouth daily.  Marland Kitchen  omeprazole (PRILOSEC) 20 MG capsule Take 1 capsule (20 mg total) by mouth daily. (Patient not taking: Reported on 10/13/2019)    FAMILY HISTORY:   Her family history includes Diabetes in her mother and sister; Lung cancer in her father; Ovarian cancer in her mother; Stroke in her maternal grandfather.  SOCIAL HISTORY:  She  reports that she has never smoked. She has never used smokeless tobacco. She reports that she does not drink alcohol or use drugs.  REVIEW OF SYSTEMS:    Na   CCM APP Time 35 minutes  Magdalen Spatz, MSN, AGACNP-BC [place] Pager # [phone] After 4 pm please call [phone] 10/13/2019 10:25 AM

## 2019-10-14 DIAGNOSIS — N390 Urinary tract infection, site not specified: Secondary | ICD-10-CM

## 2019-10-14 LAB — BASIC METABOLIC PANEL
Anion gap: 13 (ref 5–15)
BUN: 28 mg/dL — ABNORMAL HIGH (ref 8–23)
BUN: 25 mg/dL — ABNORMAL HIGH (ref 8–23)
CO2: >50 mmol/L — ABNORMAL HIGH (ref 22–32)
CO2: 45 mmol/L — ABNORMAL HIGH (ref 22–32)
Calcium: 9.3 mg/dL (ref 8.9–10.3)
Calcium: 9.1 mg/dL (ref 8.9–10.3)
Chloride: 84 mmol/L — ABNORMAL LOW (ref 98–111)
Chloride: 84 mmol/L — ABNORMAL LOW (ref 98–111)
Creatinine, Ser: 1.04 mg/dL — ABNORMAL HIGH (ref 0.44–1.00)
Creatinine, Ser: 0.91 mg/dL (ref 0.44–1.00)
GFR calc Af Amer: 58 mL/min — ABNORMAL LOW (ref >60)
GFR calc Af Amer: >60 mL/min (ref >60)
GFR calc non Af Amer: 50 mL/min — ABNORMAL LOW (ref >60)
GFR calc non Af Amer: 58 mL/min — ABNORMAL LOW (ref >60)
Glucose, Bld: 109 mg/dL — ABNORMAL HIGH (ref 70–99)
Glucose, Bld: 87 mg/dL (ref 70–99)
Potassium: 3.3 mmol/L — ABNORMAL LOW (ref 3.5–5.1)
Potassium: 3.5 mmol/L (ref 3.5–5.1)
Sodium: 142 mmol/L (ref 135–145)
Sodium: 142 mmol/L (ref 135–145)

## 2019-10-14 LAB — URINALYSIS, ROUTINE W REFLEX MICROSCOPIC
Bacteria, UA: NONE SEEN
Bilirubin Urine: NEGATIVE
Glucose, UA: NEGATIVE mg/dL
Hgb urine dipstick: NEGATIVE
Ketones, ur: NEGATIVE mg/dL
Nitrite: NEGATIVE
Protein, ur: NEGATIVE mg/dL
Specific Gravity, Urine: 1.006 (ref 1.005–1.030)
WBC, UA: >50 WBC/hpf — ABNORMAL HIGH (ref 0–5)
pH: 8 (ref 5.0–8.0)

## 2019-10-14 MED ORDER — POTASSIUM CHLORIDE CRYS ER 20 MEQ PO TBCR
40.0000 meq | EXTENDED_RELEASE_TABLET | Freq: Four times a day (QID) | ORAL | Status: AC
  Administered 2019-10-14 (×2): 40 meq via ORAL
  Filled 2019-10-14 (×2): qty 2

## 2019-10-14 MED ORDER — SODIUM CHLORIDE 0.9 % IV SOLN
1.0000 g | INTRAVENOUS | Status: DC
  Administered 2019-10-14 – 2019-10-16 (×3): 1 g via INTRAVENOUS
  Filled 2019-10-14 (×3): qty 10

## 2019-10-14 MED ORDER — SACUBITRIL-VALSARTAN 49-51 MG PO TABS
1.0000 | ORAL_TABLET | Freq: Two times a day (BID) | ORAL | Status: DC
  Administered 2019-10-14 – 2019-10-16 (×5): 1 via ORAL
  Filled 2019-10-14 (×5): qty 1

## 2019-10-14 NOTE — Progress Notes (Addendum)
Paged by nursing for K of 3.3. She has normal renal function and is also taking spironolactone. In review of her chart, she has received/is receivnig 4 runs of IV potassium. This K was from 0245. Will repeat BMP at 10:00 AM prior to supplementing additional K.

## 2019-10-14 NOTE — Progress Notes (Signed)
Patient is awake, spoke to daughter and provided an update on her's mother progress. She had questions about her mother's thoracentesis.

## 2019-10-14 NOTE — Progress Notes (Signed)
PROGRESS NOTE    Barbara Kelley  [id number] DOB: November 18, 1933 DOA: 10/08/2019 PCP: [name], MD  Outpatient Specialists:   Brief Narrative:  Patient is an 86 year old Caucasian female with history of cardiomyopathy/CHF with EF of 10% and grade 2 diastolic dysfunction.  Patient also has moderately elevated pulmonary artery systolic pressure.  Patient was initially admitted to ICU with weakness and acute heart failure.  On presentation to the hospital, ABG done revealed pH of 7.30, PCO2 of 87.5 with PO2 of 99%.  Patient was initially admitted to ICU and transferred to the telemetry floor yesterday.  Patient was also found to have significant right-sided pleural effusion that was tapped.  Patient is under the care of the cardiology team.  Medical team has been asked to follow patient to assist with medical management.  10/14/2019: Patient seen.  Patient reports burning sensation.  According to the patient, the burning sensation is typical above her previous UTI.  Urine culture has been ordered.  Will start patient on IV Rocephin once urine sample has been taken.  Shortness of breath seems to have improved significantly with thoracentesis.  Patient is currently on 2 L of supplemental oxygen.  No worsening of shortness of breath reported.  No fever chills.  No cough.  No other constitutional symptoms reported.   Assessment & Plan:   Active Problems:   Acute on chronic heart failure (HCC)   S/P thoracentesis   Shortness of breath  Acute on chronic combined systolic and diastolic CHF: -Cardiology team is managing patient.  Possible UTI: -Follow urine culture. -Start IV antibiotics once sample has been taken for urine culture.  Hypertension: -Reasonably controlled. -Continue to monitor closely.  Respiratory failure with hypoxia and hypercapnia, acute versus acute on chronic: -Seems to have improved significantly. -Patient is only on 2 L of supplemental oxygen.  Patient feels  comfortable.  DVT prophylaxis: Subcutaneous Lovenox Code Status: Full code Family Communication:  Disposition Plan: This will depend on hospital course  Procedures:   Echocardiogram  Antimicrobials:   We will start patient on IV Rocephin   Subjective: Reports burning sensation (dysuria)  Objective: Vitals:   10/14/19 0111 10/14/19 0431 10/14/19 0917 10/14/19 0952  BP: 121/69  130/71 (!) 121/56  Pulse: 71  64 62  Resp: 18  18 20   Temp: 98.4 F (36.9 C)  97.6 F (36.4 C)   TempSrc: Oral  Oral   SpO2: 90%  97% 96%  Weight:  46.5 kg    Height:        Intake/Output Summary (Last 24 hours) at 10/14/2019 1730 Last data filed at 10/14/2019 1623 Gross per 24 hour  Intake 480 ml  Output 2800 ml  Net -2320 ml   Filed Weights   10/12/19 0340 10/13/19 0500 10/14/19 0431  Weight: 54.1 kg 50.6 kg 46.5 kg    Examination:  General exam: Appears calm and comfortable.  Patient is cachectic. Respiratory system: Clear to auscultation. Respiratory effort normal. Cardiovascular system: S1 & S2 heard.  Fullness of the ankle noted.  Gastrointestinal system: Abdomen is nondistended, soft and nontender. No organomegaly or masses felt. Normal bowel sounds heard. Central nervous system: Alert and oriented.  Patient moves all extremities.   Extremities: Fullness of the ankle.  Data Reviewed: I have personally reviewed following labs and imaging studies  CBC: Recent Labs  Lab 10/08/19 1446 10/09/19 1246 10/09/19 1952 10/12/19 1524  WBC 8.3 7.6 9.0  --   HGB 15.3* 14.1 14.6 16.0*  HCT 47.6* 44.1 46.9*  47.0*  MCV 91.9 90.0 91.4  --   PLT 149* 166 169  --    Basic Metabolic Panel: Recent Labs  Lab 10/09/19 1246 10/11/19 0513 10/12/19 0456 10/12/19 1524 10/13/19 0234 10/14/19 0245 10/14/19 0930  NA  --  144 144 143 146* 142 142  K  --  3.9 4.3 3.8 3.6 3.3* 3.5  CL  --  97* 97*  --  92* 84* 84*  CO2  --  37* 39*  --  41* >50* 45*  GLUCOSE  --  137* 154*  --  102* 109* 87    BUN  --  26* 29*  --  26* 28* 25*  CREATININE 0.82 0.88 0.96  --  0.88 1.04* 0.91  CALCIUM  --  9.1 9.5  --  9.3 9.3 9.1  MG 2.2  --   --   --   --   --   --    GFR: Estimated Creatinine Clearance: 31.9 mL/min (by C-G formula based on SCr of 0.91 mg/dL). Liver Function Tests: Recent Labs  Lab 10/09/19 1952 10/12/19 1427  AST 65* 23  ALT 16 17  ALKPHOS 65 55  BILITOT 1.6* 1.2  PROT 6.1* 6.0*  ALBUMIN 3.5 3.3*   No results for input(s): LIPASE, AMYLASE in the last 168 hours. No results for input(s): AMMONIA in the last 168 hours. Coagulation Profile: No results for input(s): INR, PROTIME in the last 168 hours. Cardiac Enzymes: No results for input(s): CKTOTAL, CKMB, CKMBINDEX, TROPONINI in the last 168 hours. BNP (last 3 results) Recent Labs    04/05/19 1535  PROBNP 6,871*   HbA1C: No results for input(s): HGBA1C in the last 72 hours. CBG: Recent Labs  Lab 10/12/19 0412 10/12/19 0739 10/12/19 1418  GLUCAP 158* 123* 107*   Lipid Profile: No results for input(s): CHOL, HDL, LDLCALC, TRIG, CHOLHDL, LDLDIRECT in the last 72 hours. Thyroid Function Tests: No results for input(s): TSH, T4TOTAL, FREET4, T3FREE, THYROIDAB in the last 72 hours. Anemia Panel: No results for input(s): VITAMINB12, FOLATE, FERRITIN, TIBC, IRON, RETICCTPCT in the last 72 hours. Urine analysis:    Component Value Date/Time   COLORURINE YELLOW 10/18/2017 0716   APPEARANCEUR HAZY (A) 10/18/2017 0716   LABSPEC 1.012 10/18/2017 0716   PHURINE 7.0 10/18/2017 0716   GLUCOSEU NEGATIVE 10/18/2017 0716   HGBUR NEGATIVE 10/18/2017 0716   BILIRUBINUR NEGATIVE 10/18/2017 0716   KETONESUR 5 (A) 10/18/2017 0716   PROTEINUR NEGATIVE 10/18/2017 0716   NITRITE POSITIVE (A) 10/18/2017 0716   LEUKOCYTESUR TRACE (A) 10/18/2017 0716   Sepsis Labs: @LABRCNTIP (procalcitonin:4,lacticidven:4)  ) Recent Results (from the past 240 hour(s))  SARS CORONAVIRUS 2 (TAT 6-24 HRS) Nasopharyngeal Nasopharyngeal Swab      Status: None   Collection Time: 10/09/19  9:45 AM   Specimen: Nasopharyngeal Swab  Result Value Ref Range Status   SARS Coronavirus 2 NEGATIVE NEGATIVE Final    Comment: (NOTE) SARS-CoV-2 target nucleic acids are NOT DETECTED. The SARS-CoV-2 RNA is generally detectable in upper and lower respiratory specimens during the acute phase of infection. Negative results do not preclude SARS-CoV-2 infection, do not rule out co-infections with other pathogens, and should not be used as the sole basis for treatment or other patient management decisions. Negative results must be combined with clinical observations, patient history, and epidemiological information. The expected result is Negative. Fact Sheet for Patients: SugarRoll.be Fact Sheet for Healthcare Providers: https://www.woods-mathews.com/ This test is not yet approved or cleared by the [place] FDA and  has been authorized for detection and/or diagnosis of SARS-CoV-2 by FDA under an Emergency Use Authorization (EUA). This EUA will remain  in effect (meaning this test can be used) for the duration of the COVID-19 declaration under Section 56 4(b)(1) of the Act, 21 U.S.C. section 360bbb-3(b)(1), unless the authorization is terminated or revoked sooner. Performed at [facility], [place] [address], [place], [place] [address]   MRSA PCR Screening     Status: None   Collection Time: 10/12/19  9:40 AM   Specimen: Nasal Mucosa; Nasopharyngeal  Result Value Ref Range Status   MRSA by PCR NEGATIVE NEGATIVE Final    Comment:        The GeneXpert MRSA Assay (FDA approved for NASAL specimens only), is one component of a comprehensive MRSA colonization surveillance program. It is not intended to diagnose MRSA infection nor to guide or monitor treatment for MRSA infections. Performed at [facility], [place] [address], [place], [place] [address]   Body fluid culture (includes gram  stain)     Status: None (Preliminary result)   Collection Time: 10/12/19 12:49 PM   Specimen: Pleural Fluid  Result Value Ref Range Status   Specimen Description PLEURAL RIGHT  Final   Special Requests NONE  Final   Gram Stain NO WBC SEEN NO ORGANISMS SEEN   Final   Culture   Final    NO GROWTH 2 DAYS Performed at [facility], [place] [address], [place], [place] [address]    Report Status PENDING  Incomplete         Radiology Studies: No results found.      Scheduled Meds: . carvedilol  3.125 mg Oral BID WC  . chlorhexidine  15 mL Mouth Rinse BID  . Chlorhexidine Gluconate Cloth  6 each Topical Daily  . digoxin  0.125 mg Oral Daily  . enoxaparin (LOVENOX) injection  40 mg Subcutaneous Q24H  . mouth rinse  15 mL Mouth Rinse q12n4p  . pantoprazole  40 mg Oral Daily  . potassium chloride  40 mEq Oral Q6H  . sacubitril-valsartan  1 tablet Oral BID  . sodium chloride flush  3 mL Intravenous Q12H  . spironolactone  12.5 mg Oral Daily   Continuous Infusions: . sodium chloride Stopped (10/13/19 0537)  . cefTRIAXone (ROCEPHIN)  IV    . furosemide 120 mg (10/14/19 1007)     LOS: 5 days    Time spent: 35 minutes.    Dana Allan, MD  [facility] Pager #: [phone] 7PM-7AM contact night coverage as above

## 2019-10-14 NOTE — Progress Notes (Deleted)
Patient is alert however disoriented about situation. Patient refused AM meds, MD is aware, and son is at the bedside.

## 2019-10-14 NOTE — Progress Notes (Addendum)
Progress Note  Patient Name: Barbara Kelley Date of Encounter: 10/14/2019  Primary Cardiologist: Kirk Ruths, MD   Subjective   Mild dyspnea; no chest pain; somnelent  Inpatient Medications    Scheduled Meds: . carvedilol  3.125 mg Oral BID WC  . chlorhexidine  15 mL Mouth Rinse BID  . Chlorhexidine Gluconate Cloth  6 each Topical Daily  . digoxin  0.125 mg Oral Daily  . enoxaparin (LOVENOX) injection  40 mg Subcutaneous Q24H  . mouth rinse  15 mL Mouth Rinse q12n4p  . metolazone  2.5 mg Oral Daily  . pantoprazole  40 mg Oral Daily  . sacubitril-valsartan  1 tablet Oral BID  . sodium chloride flush  3 mL Intravenous Q12H  . spironolactone  12.5 mg Oral Daily   Continuous Infusions: . sodium chloride Stopped (10/13/19 0537)  . furosemide 120 mg (10/13/19 1816)   PRN Meds: sodium chloride, acetaminophen, nitroGLYCERIN, ondansetron (ZOFRAN) IV, sodium chloride flush   Vital Signs    Vitals:   10/13/19 1754 10/13/19 2108 10/14/19 0111 10/14/19 0431  BP: (!) 142/71 (!) 144/80 121/69   Pulse: 77 93 71   Resp: 18 18 18    Temp: 98.3 F (36.8 C) 98.4 F (36.9 C) 98.4 F (36.9 C)   TempSrc: Oral Oral Oral   SpO2: 97% (!) 83% 90%   Weight:    46.5 kg  Height:        Intake/Output Summary (Last 24 hours) at 10/14/2019 0830 Last data filed at 10/14/2019 0457 Gross per 24 hour  Intake 453.1 ml  Output 2352 ml  Net -1898.9 ml   Last 3 Weights 10/14/2019 10/13/2019 10/12/2019  Weight (lbs) 102 lb 8.2 oz 111 lb 8.8 oz 119 lb 4.3 oz  Weight (kg) 46.5 kg 50.6 kg 54.1 kg      Telemetry    Sinus rhythm with PACs, PVCs and NSVT- Personally Reviewed   Physical Exam   GEN: Frail NAD Neck: + JVD, supple Cardiac: RRR Respiratory:  Diminished breath sounds bases bilaterally GI: Soft, NT/ND, umbilical hernia, no masses MS:  1+ edema; erythematous bilaterally; chronic skin changes Neuro:   No focal findings   Labs     Chemistry Recent Labs  Lab 10/09/19 1952  10/12/19 0456 10/12/19 1427 10/12/19 1524 10/13/19 0234 10/14/19 0245  NA 141 144  --  143 146* 142  K 5.0 4.3  --  3.8 3.6 3.3*  CL 99 97*  --   --  92* 84*  CO2 28 39*  --   --  41* >50*  GLUCOSE 154* 154*  --   --  102* 109*  BUN 29* 29*  --   --  26* 28*  CREATININE 1.01* 0.96  --   --  0.88 1.04*  CALCIUM 8.9 9.5  --   --  9.3 9.3  PROT 6.1*  --  6.0*  --   --   --   ALBUMIN 3.5  --  3.3*  --   --   --   AST 65*  --  23  --   --   --   ALT 16  --  17  --   --   --   ALKPHOS 65  --  55  --   --   --   BILITOT 1.6*  --  1.2  --   --   --   GFRNONAA 51* 55*  --   --  >60 50*  GFRAA 60* >  60  --   --  >60 58*  ANIONGAP 14 8  --   --  13 NOT CALCULATED     Hematology Recent Labs  Lab 10/08/19 1446 10/09/19 1246 10/09/19 1952 10/12/19 1524  WBC 8.3 7.6 9.0  --   RBC 5.18* 4.90 5.13*  --   HGB 15.3* 14.1 14.6 16.0*  HCT 47.6* 44.1 46.9* 47.0*  MCV 91.9 90.0 91.4  --   MCH 29.5 28.8 28.5  --   MCHC 32.1 32.0 31.1  --   RDW 16.1* 15.9* 16.1*  --   PLT 149* 166 169  --     BNP Recent Labs  Lab 10/08/19 1446  BNP >4,500.0*     Radiology    DG Chest 1 View  Result Date: 10/12/2019 CLINICAL DATA:  Post thoracentesis EXAM: CHEST  1 VIEW COMPARISON:  10/11/2019 FINDINGS: Significant improvement in right pleural effusion which is now small. No pneumothorax. Improved aeration right lung base. Cardiac enlargement. Negative for edema. Mild left lower lobe atelectasis. IMPRESSION: No complication post right thoracentesis. Small residual pleural effusion. Improved aeration right lung base. Electronically Signed   By: Franchot Gallo M.D.   On: 10/12/2019 13:41   DG CHEST PORT 1 VIEW  Result Date: 10/12/2019 CLINICAL DATA:  Absent breath sounds on left side of chest. EXAM: PORTABLE CHEST 1 VIEW COMPARISON:  Chest x-ray from same day. FINDINGS: Unchanged cardiomegaly. Normal pulmonary vascularity. Unchanged small right pleural effusion and mild bibasilar atelectasis. No  pneumothorax. No acute osseous abnormality. IMPRESSION: 1. Unchanged small right pleural effusion and mild bibasilar atelectasis. Electronically Signed   By: Titus Dubin M.D.   On: 10/12/2019 14:58    Cardiac Studies   Echo 10/09/19 1. Left ventricular ejection fraction, by visual estimation, is 10 %. The left ventricle has severely decreased function. There is no left ventricular hypertrophy.  2. Left ventricular diastolic parameters are consistent with Grade II diastolic dysfunction (pseudonormalization).  3. Severely dilated left ventricular internal cavity size.  4. The left ventricle demonstrates global hypokinesis.  5. Global right ventricle has low normal systolic function.The right ventricular size is normal. No increase in right ventricular wall thickness.  6. Left atrial size was severely dilated.  7. Right atrial size was severely dilated.  8. The mitral valve is normal in structure. Mild to moderate mitral valve regurgitation.  9. The tricuspid valve is grossly normal. 10. The aortic valve is tricuspid. Aortic valve regurgitation is not visualized. No evidence of aortic valve sclerosis or stenosis. 11. The pulmonic valve was normal in structure. Pulmonic valve regurgitation is trivial. 12. Moderately elevated pulmonary artery systolic pressure. 13. The atrial septum is grossly normal.   Patient Profile     86 y.o. female with pmh of nonischemic cardiomyopathy, chronic systolic CHF, mild coronary disease, hypertension who is being seen for acute CHF. Echo showed new low EF 10% with G2DD, severe LC enlargement, severe biatrial enlargement, mild to moderate MR and mod pulmonary hypertension.  Assessment & Plan    1 acute on chronic systolic congestive heart failure-I/O-1898. Wt 46.5 Kg.  Volume status improving.  We will continue present dose of Lasix and spironolactone today.  Discontinue metolazone.  Follow renal function closely.  2 right pleural effusion-status post  thoracentesis.  Improved.  Await cultures.  3 nonischemic cardiomyopathy-continue Entresto but will increase to 49/51 BID.  Continue digoxin; will advance coreg later as CHF improves.  4 hypertension-continue present medical regimen.  5 Hypokalemia-supplement  For questions or updates,  please contact [place] Please consult [email or web address] for contact info under

## 2019-10-15 LAB — BASIC METABOLIC PANEL
BUN: 19 mg/dL (ref 8–23)
CO2: >50 mmol/L — ABNORMAL HIGH (ref 22–32)
Calcium: 9.4 mg/dL (ref 8.9–10.3)
Chloride: 83 mmol/L — ABNORMAL LOW (ref 98–111)
Creatinine, Ser: 0.77 mg/dL (ref 0.44–1.00)
GFR calc Af Amer: >60 mL/min (ref >60)
GFR calc non Af Amer: >60 mL/min (ref >60)
Glucose, Bld: 95 mg/dL (ref 70–99)
Potassium: 4.3 mmol/L (ref 3.5–5.1)
Sodium: 141 mmol/L (ref 135–145)

## 2019-10-15 LAB — BODY FLUID CULTURE
Culture: NO GROWTH
Gram Stain: NONE SEEN

## 2019-10-15 LAB — SARS CORONAVIRUS 2 (TAT 6-24 HRS): SARS Coronavirus 2: NEGATIVE

## 2019-10-15 MED ORDER — CARVEDILOL 6.25 MG PO TABS
6.2500 mg | ORAL_TABLET | Freq: Two times a day (BID) | ORAL | Status: DC
  Administered 2019-10-15 – 2019-10-16 (×2): 6.25 mg via ORAL
  Filled 2019-10-15 (×3): qty 1

## 2019-10-15 MED ORDER — SALINE SPRAY 0.65 % NA SOLN
1.0000 | NASAL | Status: DC | PRN
  Administered 2019-10-15: 1 via NASAL
  Filled 2019-10-15: qty 44

## 2019-10-15 MED ORDER — FUROSEMIDE 80 MG PO TABS
80.0000 mg | ORAL_TABLET | Freq: Two times a day (BID) | ORAL | Status: DC
  Administered 2019-10-15 – 2019-10-16 (×3): 80 mg via ORAL
  Filled 2019-10-15 (×3): qty 1

## 2019-10-15 MED ORDER — DIGOXIN 125 MCG PO TABS
0.0625 mg | ORAL_TABLET | Freq: Every day | ORAL | Status: DC
  Administered 2019-10-15 – 2019-10-16 (×2): 0.0625 mg via ORAL
  Filled 2019-10-15 (×2): qty 1

## 2019-10-15 NOTE — TOC Initial Note (Signed)
Transition of Care [facility]) - Initial/Assessment Note    Patient Details  Name: Barbara Kelley MRN: [id number] Date of Birth: October 09, 1933  Transition of Care [facility]) CM/SW Contact:    Alberteen Sam, [place] Phone Number: [phone] 10/15/2019, 3:12 PM  Clinical Narrative:                  CSW spoke with [name] regarding discharge planning and SNF recommendation for short term rehab. [name] is in agreement with this, as she reports patient is from [place] and has anticipated her need for short term rehab at time of discharge. [name] reports preference for [facility], and has agreed for referrals to be sent out to SNFs in [place] area for bed offers.   Pending bed offer at this time.   Expected Discharge Plan: Skilled Nursing Facility Barriers to Discharge: Continued Medical Work up   Patient Goals and CMS Choice   CMS Medicare.gov Compare Post Acute Care list provided to:: Patient Represenative (must comment)([name] (daughter)) Choice offered to / list presented to : Adult Children  Expected Discharge Plan and Services Expected Discharge Plan: [place] Acute Care Choice: [place] Living arrangements for the past 2 months: Independent Living Facility([place])                                      Prior Living Arrangements/Services Living arrangements for the past 2 months: Independent Living Facility([facility]) Lives with:: Self Patient language and need for interpreter reviewed:: Yes Do you feel safe going back to the place where you live?: No   needs short term rehab before returning to ILF  Need for Family Participation in Patient Care: Yes (Comment) Care giver support system in place?: Yes (comment)   Criminal Activity/Legal Involvement Pertinent to Current Situation/Hospitalization: No - Comment as needed  Activities of Daily Living Home Assistive  Devices/Equipment: Walker (specify type) ADL Screening (condition at time of admission) Patient's cognitive ability adequate to safely complete daily activities?: Yes Is the patient deaf or have difficulty hearing?: Yes Does the patient have difficulty seeing, even when wearing glasses/contacts?: No Does the patient have difficulty concentrating, remembering, or making decisions?: No Patient able to express need for assistance with ADLs?: Yes Does the patient have difficulty dressing or bathing?: No Independently performs ADLs?: Yes (appropriate for developmental age) Does the patient have difficulty walking or climbing stairs?: Yes Weakness of Legs: Both Weakness of Arms/Hands: None  Permission Sought/Granted Permission sought to share information with : Case Manager, Customer service manager, Family Supports Permission granted to share information with : Yes, Verbal Permission Granted  Share Information with NAME: [name]  Permission granted to share info w AGENCY: SNFs  Permission granted to share info w Relationship: daughter  Permission granted to share info w Contact Information: [phone]  Emotional Assessment Appearance:: Appears stated age Attitude/Demeanor/Rapport: Unable to Assess Affect (typically observed): Unable to Assess Orientation: : Oriented to Self Alcohol / Substance Use: Not Applicable Psych Involvement: No (comment)  Admission diagnosis:  Acute on chronic heart failure (HCC) [I50.9] Acute on chronic congestive heart failure, unspecified heart failure type [facility]) [I50.9] Patient Active Problem List   Diagnosis Date Noted  . S/P thoracentesis   . Shortness of breath   . Acute on chronic heart failure ([place]) 10/09/2019  . Mitral regurgitation 08/11/2018  . Mild CAD 08/11/2018  .  Closed fracture of right hip ([place])   . Closed subcapital fracture of neck of femur, right, initial encounter ([place]) 10/18/2017  . Hypertensive heart disease without CHF  10/18/2017  . NICM (nonischemic cardiomyopathy) ([place]) 10/18/2017  . Chronic systolic heart failure ([place]) 10/18/2017  . HLD (hyperlipidemia) 10/18/2017  . Near syncope 10/18/2017  . S/P left TKA 05/04/2017  . S/P total knee replacement 05/04/2017  . History of CVA (cerebrovascular accident) 10/05/2006   PCP:  [name], MD Pharmacy:   [facility] [address] [place] [address] Phone: [phone] Fax: [phone]     Social Determinants of Health (SDOH) Interventions    Readmission Risk Interventions No flowsheet data found.

## 2019-10-15 NOTE — NC FL2 (Signed)
[place] LEVEL OF CARE SCREENING TOOL     IDENTIFICATION  Patient Name: Barbara Kelley Birthdate: 03-21-1934 Sex: female Admission Date (Current Location): 10/08/2019  [facility] and [place] Number:  Herbalist and Address:  [facility] [place] [address], [place],  [address]      Provider Number: [id number]  Attending Physician Name and Address:  Lelon Perla, MD  Relative Name and Phone Number:  [name] (daughter) [phone]    Current Level of Care: Hospital Recommended Level of Care: [place] Prior Approval Number:    Date Approved/Denied:   PASRR Number: [id number]  Discharge Plan: SNF    Current Diagnoses: Patient Active Problem List   Diagnosis Date Noted  . S/P thoracentesis   . Shortness of breath   . Acute on chronic heart failure ([place]) 10/09/2019  . Mitral regurgitation 08/11/2018  . Mild CAD 08/11/2018  . Closed fracture of right hip ([place])   . Closed subcapital fracture of neck of femur, right, initial encounter ([place]) 10/18/2017  . Hypertensive heart disease without CHF 10/18/2017  . NICM (nonischemic cardiomyopathy) ([place]) 10/18/2017  . Chronic systolic heart failure ([place]) 10/18/2017  . HLD (hyperlipidemia) 10/18/2017  . Near syncope 10/18/2017  . S/P left TKA 05/04/2017  . S/P total knee replacement 05/04/2017  . History of CVA (cerebrovascular accident) 10/05/2006    Orientation RESPIRATION BLADDER Height & Weight     Self  O2(nasal cannula 3L/min) Incontinent, External catheter Weight: 102 lb 4.7 oz (46.4 kg) Height:  4\' 11"  (149.9 cm)  BEHAVIORAL SYMPTOMS/MOOD NEUROLOGICAL BOWEL NUTRITION STATUS      Incontinent Diet(see discharge summary)  AMBULATORY STATUS COMMUNICATION OF NEEDS Skin   Extensive Assist Verbally Other (Comment)(ecchymosis arms, legs, hands, MASD buttocks and groin)                       Personal Care Assistance Level of Assistance  Bathing,  Dressing, Total care, Feeding Bathing Assistance: Limited assistance Feeding assistance: Independent Dressing Assistance: Limited assistance Total Care Assistance: Maximum assistance   Functional Limitations Info  Sight, Hearing, Speech Sight Info: Impaired Hearing Info: Adequate Speech Info: Adequate    SPECIAL CARE FACTORS FREQUENCY  PT (By licensed PT), OT (By licensed OT)     PT Frequency: min 5x weekly OT Frequency: min 5x weekly            Contractures Contractures Info: Not present    Additional Factors Info  Code Status, Allergies Code Status Info: Full Allergies Info: doxycycline, macrobid (nitrofurantoin), valsartan, lisinopril, calcium channel blockers, penicillins           Current Medications (10/15/2019):  This is the current hospital active medication list Current Facility-Administered Medications  Medication Dose Route Frequency Provider Last Rate Last Admin  . 0.9 %  sodium chloride infusion  250 mL Intravenous PRN Kipp Brood, MD   Stopped at 10/13/19 0537  . acetaminophen (TYLENOL) tablet 650 mg  650 mg Oral Q4H PRN Kipp Brood, MD   650 mg at 10/14/19 1248  . carvedilol (COREG) tablet 6.25 mg  6.25 mg Oral BID WC Lelon Perla, MD      . cefTRIAXone (ROCEPHIN) 1 g in sodium chloride 0.9 % 100 mL IVPB  1 g Intravenous Q24H Dana Allan I, MD 200 mL/hr at 10/14/19 1837 1 g at 10/14/19 1837  . chlorhexidine (PERIDEX) 0.12 % solution 15 mL  15 mL Mouth Rinse BID Kipp Brood, MD  15 mL at 10/15/19 1009  . Chlorhexidine Gluconate Cloth 2 % PADS 6 each  6 each Topical Daily Kipp Brood, MD   6 each at 10/13/19 1325  . digoxin (LANOXIN) tablet 0.0625 mg  0.0625 mg Oral Daily Lelon Perla, MD   0.0625 mg at 10/15/19 1010  . enoxaparin (LOVENOX) injection 40 mg  40 mg Subcutaneous Q24H Agarwala, Einar Grad, MD   40 mg at 10/15/19 1154  . furosemide (LASIX) tablet 80 mg  80 mg Oral BID Lelon Perla, MD   80 mg at 10/15/19 1013  . MEDLINE  mouth rinse  15 mL Mouth Rinse q12n4p Agarwala, Ravi, MD   15 mL at 10/15/19 1155  . nitroGLYCERIN (NITROSTAT) SL tablet 0.4 mg  0.4 mg Sublingual Q5 min PRN Agarwala, Einar Grad, MD      . ondansetron (ZOFRAN) injection 4 mg  4 mg Intravenous Q6H PRN Agarwala, Ravi, MD      . pantoprazole (PROTONIX) EC tablet 40 mg  40 mg Oral Daily Agarwala, Einar Grad, MD   40 mg at 10/15/19 1010  . sacubitril-valsartan (ENTRESTO) 49-51 mg per tablet  1 tablet Oral BID Lelon Perla, MD   1 tablet at 10/15/19 1010  . sodium chloride flush (NS) 0.9 % injection 3 mL  3 mL Intravenous Q12H Agarwala, Einar Grad, MD   3 mL at 10/15/19 1010  . sodium chloride flush (NS) 0.9 % injection 3 mL  3 mL Intravenous PRN Agarwala, Einar Grad, MD      . spironolactone (ALDACTONE) tablet 12.5 mg  12.5 mg Oral Daily Agarwala, Einar Grad, MD   12.5 mg at 10/15/19 1010     Discharge Medications: Please see discharge summary for a list of discharge medications.  Relevant Imaging Results:  Relevant Lab Results:   Additional Information SSN: [id number]  Alberteen Sam, LCSW

## 2019-10-15 NOTE — Plan of Care (Signed)

## 2019-10-15 NOTE — Progress Notes (Addendum)
Progress Note  Patient Name: Barbara Kelley Date of Encounter: 10/15/2019  Primary Cardiologist: Kirk Ruths, MD   Subjective   Denies dyspnea or chest pain  Inpatient Medications    Scheduled Meds: . carvedilol  3.125 mg Oral BID WC  . chlorhexidine  15 mL Mouth Rinse BID  . Chlorhexidine Gluconate Cloth  6 each Topical Daily  . digoxin  0.125 mg Oral Daily  . enoxaparin (LOVENOX) injection  40 mg Subcutaneous Q24H  . mouth rinse  15 mL Mouth Rinse q12n4p  . pantoprazole  40 mg Oral Daily  . sacubitril-valsartan  1 tablet Oral BID  . sodium chloride flush  3 mL Intravenous Q12H  . spironolactone  12.5 mg Oral Daily   Continuous Infusions: . sodium chloride Stopped (10/13/19 0537)  . cefTRIAXone (ROCEPHIN)  IV 1 g (10/14/19 1837)  . furosemide 120 mg (10/14/19 1733)   PRN Meds: sodium chloride, acetaminophen, nitroGLYCERIN, ondansetron (ZOFRAN) IV, sodium chloride flush   Vital Signs    Vitals:   10/14/19 1731 10/14/19 1956 10/15/19 0455 10/15/19 0456  BP: (!) 146/86 121/63  134/72  Pulse: 71 66  66  Resp: 20 15  17   Temp:  98.1 F (36.7 C)  97.8 F (36.6 C)  TempSrc:  Oral  Oral  SpO2: 100% 100%  100%  Weight:   46.4 kg   Height:        Intake/Output Summary (Last 24 hours) at 10/15/2019 0921 Last data filed at 10/15/2019 0455 Gross per 24 hour  Intake 1276.29 ml  Output 2275 ml  Net -998.71 ml   Last 3 Weights 10/15/2019 10/14/2019 10/13/2019  Weight (lbs) 102 lb 4.7 oz 102 lb 8.2 oz 111 lb 8.8 oz  Weight (kg) 46.4 kg 46.5 kg 50.6 kg      Telemetry    Sinus rhythm with PACs, PVCs and NSVT- Personally Reviewed   Physical Exam   GEN: WD Frail NAD Neck: supple Cardiac: RRR, no rub Respiratory:  Diminished breath sounds bases bilaterally; no wheeze GI: Soft, NT/ND MS:  1+ edema; erythematous bilaterally; chronic skin changes Neuro:   Grossly intact   Labs     Chemistry Recent Labs  Lab 10/09/19 1952 10/12/19 1427 10/13/19 0234  10/14/19 0245 10/14/19 0930  NA 141  --  146* 142 142  K 5.0  --  3.6 3.3* 3.5  CL 99  --  92* 84* 84*  CO2 28  --  41* >50* 45*  GLUCOSE 154*  --  102* 109* 87  BUN 29*  --  26* 28* 25*  CREATININE 1.01*  --  0.88 1.04* 0.91  CALCIUM 8.9  --  9.3 9.3 9.1  PROT 6.1* 6.0*  --   --   --   ALBUMIN 3.5 3.3*  --   --   --   AST 65* 23  --   --   --   ALT 16 17  --   --   --   ALKPHOS 65 55  --   --   --   BILITOT 1.6* 1.2  --   --   --   GFRNONAA 51*  --  >60 50* 58*  GFRAA 60*  --  >60 58* >60  ANIONGAP 14  --  13 NOT CALCULATED 13     Hematology Recent Labs  Lab 10/08/19 1446 10/09/19 1246 10/09/19 1952 10/12/19 1524  WBC 8.3 7.6 9.0  --   RBC 5.18* 4.90 5.13*  --  HGB 15.3* 14.1 14.6 16.0*  HCT 47.6* 44.1 46.9* 47.0*  MCV 91.9 90.0 91.4  --   MCH 29.5 28.8 28.5  --   MCHC 32.1 32.0 31.1  --   RDW 16.1* 15.9* 16.1*  --   PLT 149* 166 169  --     BNP Recent Labs  Lab 10/08/19 1446  BNP >4,500.0*     Cardiac Studies   Echo 10/09/19 1. Left ventricular ejection fraction, by visual estimation, is 10 %. The left ventricle has severely decreased function. There is no left ventricular hypertrophy.  2. Left ventricular diastolic parameters are consistent with Grade II diastolic dysfunction (pseudonormalization).  3. Severely dilated left ventricular internal cavity size.  4. The left ventricle demonstrates global hypokinesis.  5. Global right ventricle has low normal systolic function.The right ventricular size is normal. No increase in right ventricular wall thickness.  6. Left atrial size was severely dilated.  7. Right atrial size was severely dilated.  8. The mitral valve is normal in structure. Mild to moderate mitral valve regurgitation.  9. The tricuspid valve is grossly normal. 10. The aortic valve is tricuspid. Aortic valve regurgitation is not visualized. No evidence of aortic valve sclerosis or stenosis. 11. The pulmonic valve was normal in structure.  Pulmonic valve regurgitation is trivial. 12. Moderately elevated pulmonary artery systolic pressure. 13. The atrial septum is grossly normal.   Patient Profile     86 y.o. female with pmh of nonischemic cardiomyopathy, chronic systolic CHF, mild coronary disease, hypertension who is being seen for acute CHF. Echo showed new low EF 10% with G2DD, severe LC enlargement, severe biatrial enlargement, mild to moderate MR and mod pulmonary hypertension.  Assessment & Plan    1 acute on chronic systolic congestive heart failure-I/O-998. Wt 46.4 Kg.  Will change Lasix to 80 mg by mouth twice daily.  Continue spironolactone.  Needs fluid restriction and low-sodium diet.  Check potassium and renal function.  2 right pleural effusion-status post thoracentesis.  Improved.  Await final culture results; no growth to date.  3 nonischemic cardiomyopathy-Entresto dose increased yesterday.  Decrease digoxin to 0.0625 mg daily.  Continue spironolactone.  We will advance carvedilol to 6.25 mg twice daily.  Increase further as an outpatient as tolerated.  4 hypertension-continue present medical regimen.  5 UTI-antibiotics initiated by primary care.  Follow-up culture.  Need to mobilize.  We will have physical therapy evaluate.  For questions or updates, please contact [place] Please consult [email or web address] for contact info under

## 2019-10-15 NOTE — Progress Notes (Signed)
Spoke to [name] and provided an update on her mother's progress.

## 2019-10-15 NOTE — Progress Notes (Signed)
PROGRESS NOTE    Barbara Kelley  [id number] DOB: [birth year] DOA: 10/08/2019 PCP: [name], MD  Outpatient Specialists:   Brief Narrative:  Patient is an 86 year old Caucasian female with history of cardiomyopathy/CHF with EF of 10% and grade 2 diastolic dysfunction.  Patient also has moderately elevated pulmonary artery systolic pressure.  Patient was initially admitted to ICU with weakness and acute heart failure.  On presentation to the hospital, ABG done revealed pH of 7.30, PCO2 of 87.5 with PO2 of 99%.  Patient was initially admitted to ICU and transferred to the telemetry floor yesterday.  Patient was also found to have significant right-sided pleural effusion that was tapped.  Patient is under the care of the cardiology team.  Medical team has been asked to follow patient to assist with medical management.  10/14/2019: Patient seen.  Patient reports burning sensation.  According to the patient, the burning sensation is typical above her previous UTI.  Urine culture has been ordered.  Will start patient on IV Rocephin once urine sample has been taken.  Shortness of breath seems to have improved significantly with thoracentesis.  Patient is currently on 2 L of supplemental oxygen.  No worsening of shortness of breath reported.  No fever chills.  No cough.  No other constitutional symptoms reported.  10/14/2018: Patient seen.  Patient looks a lot better today.  UA suggestive of likely UTI.  Patient is currently on IV Rocephin.  Depending on the urine culture, will complete 3-day course of IV Rocephin.  Patient can be discharged on oral antibiotics if patient is discharged prior to completing IV Rocephin.  Discussed with the cardiology team, Dr. Kirk Ruths, MD hospitalist service will sign off.  Thanks for allowing [place] to participate in patient's care.  Kindly call us back if needed.   Assessment & Plan:   Active Problems:   Acute on chronic heart failure (HCC)   S/P thoracentesis   Shortness of breath  Acute on chronic combined systolic and diastolic CHF: -Cardiology team is managing patient.  Possible UTI: -Follow urine culture. -Complete 3-day course of antibiotics (dependent on urine culture.   -See above documentation.    Hypertension: -Reasonably controlled. -Continue to monitor closely.  Respiratory failure with hypoxia and hypercapnia, acute versus acute on chronic: -Seems to have resolved significantly. -Patient is only on 2 L of supplemental oxygen.  Patient feels comfortable.  DVT prophylaxis: Subcutaneous Lovenox Code Status: Full code Family Communication:  Disposition Plan: This will depend on hospital course  Procedures:   Echocardiogram  Antimicrobials:   IV Rocephin started on 10/14/2019   Subjective: Patient looks better today. No dysuria.  Objective: Vitals:   10/14/19 1956 10/15/19 0455 10/15/19 0456 10/15/19 1008  BP: 121/63  134/72 134/70  Pulse: 66  66 (!) 59  Resp: 15  17 20   Temp: 98.1 F (36.7 C)  97.8 F (36.6 C) 97.7 F (36.5 C)  TempSrc: Oral  Oral Oral  SpO2: 100%  100% 96%  Weight:  46.4 kg    Height:        Intake/Output Summary (Last 24 hours) at 10/15/2019 1043 Last data filed at 10/15/2019 0455 Gross per 24 hour  Intake 1036.29 ml  Output 2275 ml  Net -1238.71 ml   Filed Weights   10/13/19 0500 10/14/19 0431 10/15/19 0455  Weight: 50.6 kg 46.5 kg 46.4 kg    Examination:  General exam: Appears calm and comfortable.  Patient is cachectic. Respiratory system: Clear to auscultation. Respiratory effort normal.  Cardiovascular system: S1 & S2 heard.  Fullness of the ankle noted.  Gastrointestinal system: Abdomen is nondistended, soft and nontender. No organomegaly or masses felt. Normal bowel sounds heard. Central nervous system: Alert and oriented.  Patient moves all extremities.   Extremities: Fullness of the ankle.  Data Reviewed: I have personally reviewed following labs and imaging  studies  CBC: Recent Labs  Lab 10/08/19 1446 10/09/19 1246 10/09/19 1952 10/12/19 1524  WBC 8.3 7.6 9.0  --   HGB 15.3* 14.1 14.6 16.0*  HCT 47.6* 44.1 46.9* 47.0*  MCV 91.9 90.0 91.4  --   PLT 149* 166 169  --    Basic Metabolic Panel: Recent Labs  Lab 10/09/19 1246 10/11/19 0513 10/12/19 0456 10/12/19 1524 10/13/19 0234 10/14/19 0245 10/14/19 0930  NA  --  144 144 143 146* 142 142  K  --  3.9 4.3 3.8 3.6 3.3* 3.5  CL  --  97* 97*  --  92* 84* 84*  CO2  --  37* 39*  --  41* >50* 45*  GLUCOSE  --  137* 154*  --  102* 109* 87  BUN  --  26* 29*  --  26* 28* 25*  CREATININE 0.82 0.88 0.96  --  0.88 1.04* 0.91  CALCIUM  --  9.1 9.5  --  9.3 9.3 9.1  MG 2.2  --   --   --   --   --   --    GFR: Estimated Creatinine Clearance: 31.9 mL/min (by C-G formula based on SCr of 0.91 mg/dL). Liver Function Tests: Recent Labs  Lab 10/09/19 1952 10/12/19 1427  AST 65* 23  ALT 16 17  ALKPHOS 65 55  BILITOT 1.6* 1.2  PROT 6.1* 6.0*  ALBUMIN 3.5 3.3*   No results for input(s): LIPASE, AMYLASE in the last 168 hours. No results for input(s): AMMONIA in the last 168 hours. Coagulation Profile: No results for input(s): INR, PROTIME in the last 168 hours. Cardiac Enzymes: No results for input(s): CKTOTAL, CKMB, CKMBINDEX, TROPONINI in the last 168 hours. BNP (last 3 results) Recent Labs    04/05/19 1535  PROBNP 6,871*   HbA1C: No results for input(s): HGBA1C in the last 72 hours. CBG: Recent Labs  Lab 10/12/19 0412 10/12/19 0739 10/12/19 1418  GLUCAP 158* 123* 107*   Lipid Profile: No results for input(s): CHOL, HDL, LDLCALC, TRIG, CHOLHDL, LDLDIRECT in the last 72 hours. Thyroid Function Tests: No results for input(s): TSH, T4TOTAL, FREET4, T3FREE, THYROIDAB in the last 72 hours. Anemia Panel: No results for input(s): VITAMINB12, FOLATE, FERRITIN, TIBC, IRON, RETICCTPCT in the last 72 hours. Urine analysis:    Component Value Date/Time   COLORURINE STRAW (A)  10/14/2019 1819   APPEARANCEUR CLEAR 10/14/2019 1819   LABSPEC 1.006 10/14/2019 1819   PHURINE 8.0 10/14/2019 1819   GLUCOSEU NEGATIVE 10/14/2019 1819   HGBUR NEGATIVE 10/14/2019 1819   BILIRUBINUR NEGATIVE 10/14/2019 1819   KETONESUR NEGATIVE 10/14/2019 1819   PROTEINUR NEGATIVE 10/14/2019 1819   NITRITE NEGATIVE 10/14/2019 1819   LEUKOCYTESUR LARGE (A) 10/14/2019 1819   Sepsis Labs: @LABRCNTIP (procalcitonin:4,lacticidven:4)  ) Recent Results (from the past 240 hour(s))  SARS CORONAVIRUS 2 (TAT 6-24 HRS) Nasopharyngeal Nasopharyngeal Swab     Status: None   Collection Time: 10/09/19  9:45 AM   Specimen: Nasopharyngeal Swab  Result Value Ref Range Status   SARS Coronavirus 2 NEGATIVE NEGATIVE Final    Comment: (NOTE) SARS-CoV-2 target nucleic acids are NOT DETECTED. The SARS-CoV-2  RNA is generally detectable in upper and lower respiratory specimens during the acute phase of infection. Negative results do not preclude SARS-CoV-2 infection, do not rule out co-infections with other pathogens, and should not be used as the sole basis for treatment or other patient management decisions. Negative results must be combined with clinical observations, patient history, and epidemiological information. The expected result is Negative. Fact Sheet for Patients: SugarRoll.be Fact Sheet for Healthcare Providers: https://www.woods-mathews.com/ This test is not yet approved or cleared by the [place] FDA and  has been authorized for detection and/or diagnosis of SARS-CoV-2 by FDA under an Emergency Use Authorization (EUA). This EUA will remain  in effect (meaning this test can be used) for the duration of the COVID-19 declaration under Section 56 4(b)(1) of the Act, 21 U.S.C. section 360bbb-3(b)(1), unless the authorization is terminated or revoked sooner. Performed at [facility], [place] [address] [place], [place] [address]   MRSA PCR  Screening     Status: None   Collection Time: 10/12/19  9:40 AM   Specimen: Nasal Mucosa; Nasopharyngeal  Result Value Ref Range Status   MRSA by PCR NEGATIVE NEGATIVE Final    Comment:        The GeneXpert MRSA Assay (FDA approved for NASAL specimens only), is one component of a comprehensive MRSA colonization surveillance program. It is not intended to diagnose MRSA infection nor to guide or monitor treatment for MRSA infections. Performed at [facility], [place] [address] [place], [place] [address]   Body fluid culture (includes gram stain)     Status: None (Preliminary result)   Collection Time: 10/12/19 12:49 PM   Specimen: Pleural Fluid  Result Value Ref Range Status   Specimen Description PLEURAL RIGHT  Final   Special Requests NONE  Final   Gram Stain NO WBC SEEN NO ORGANISMS SEEN   Final   Culture   Final    NO GROWTH 2 DAYS Performed at [facility], [place] [address], [place], [place] [address]    Report Status PENDING  Incomplete         Radiology Studies: No results found.      Scheduled Meds: . carvedilol  6.25 mg Oral BID WC  . chlorhexidine  15 mL Mouth Rinse BID  . Chlorhexidine Gluconate Cloth  6 each Topical Daily  . digoxin  0.0625 mg Oral Daily  . enoxaparin (LOVENOX) injection  40 mg Subcutaneous Q24H  . furosemide  80 mg Oral BID  . mouth rinse  15 mL Mouth Rinse q12n4p  . pantoprazole  40 mg Oral Daily  . sacubitril-valsartan  1 tablet Oral BID  . sodium chloride flush  3 mL Intravenous Q12H  . spironolactone  12.5 mg Oral Daily   Continuous Infusions: . sodium chloride Stopped (10/13/19 0537)  . cefTRIAXone (ROCEPHIN)  IV 1 g (10/14/19 1837)     LOS: 6 days    Time spent: 25 minutes.    Dana Allan, MD  [facility] Pager #: [phone] 7PM-7AM contact night coverage as above

## 2019-10-16 DIAGNOSIS — Z9889 Other specified postprocedural states: Secondary | ICD-10-CM

## 2019-10-16 DIAGNOSIS — I5043 Acute on chronic combined systolic (congestive) and diastolic (congestive) heart failure: Secondary | ICD-10-CM

## 2019-10-16 LAB — URINE CULTURE
Culture: >=100000 — AB
Special Requests: NORMAL

## 2019-10-16 LAB — BASIC METABOLIC PANEL
BUN: 24 mg/dL — ABNORMAL HIGH (ref 8–23)
CO2: >50 mmol/L — ABNORMAL HIGH (ref 22–32)
Calcium: 9.3 mg/dL (ref 8.9–10.3)
Chloride: 81 mmol/L — ABNORMAL LOW (ref 98–111)
Creatinine, Ser: 0.88 mg/dL (ref 0.44–1.00)
GFR calc Af Amer: >60 mL/min (ref >60)
GFR calc non Af Amer: >60 mL/min (ref >60)
Glucose, Bld: 98 mg/dL (ref 70–99)
Potassium: 3.7 mmol/L (ref 3.5–5.1)
Sodium: 142 mmol/L (ref 135–145)

## 2019-10-16 MED ORDER — FUROSEMIDE 40 MG PO TABS: 40.0000 mg | ORAL_TABLET | Freq: Two times a day (BID) | ORAL | 3 refills | Status: DC

## 2019-10-16 MED ORDER — CARVEDILOL 6.25 MG PO TABS: 6.2500 mg | ORAL_TABLET | Freq: Two times a day (BID) | ORAL | 3 refills | Status: DC

## 2019-10-16 MED ORDER — DIGOXIN 62.5 MCG PO TABS: 0.0625 mg | ORAL_TABLET | Freq: Every day | ORAL | 3 refills | Status: DC

## 2019-10-16 MED ORDER — ACETAZOLAMIDE SODIUM 500 MG IJ SOLR
500.0000 mg | Freq: Once | INTRAMUSCULAR | Status: DC
  Filled 2019-10-16 (×2): qty 500

## 2019-10-16 MED ORDER — SACUBITRIL-VALSARTAN 49-51 MG PO TABS: 1.0000 | ORAL_TABLET | Freq: Two times a day (BID) | ORAL | 3 refills | Status: DC

## 2019-10-16 MED ORDER — LORATADINE 10 MG PO TABS
10.0000 mg | ORAL_TABLET | Freq: Every day | ORAL | Status: DC
  Administered 2019-10-16: 10 mg via ORAL
  Filled 2019-10-16: qty 1

## 2019-10-16 MED ORDER — SPIRONOLACTONE 25 MG PO TABS: 12.5000 mg | ORAL_TABLET | Freq: Every day | ORAL | 3 refills | Status: DC

## 2019-10-16 NOTE — Progress Notes (Signed)
Pulmonary Progress Note Seen in f/u for pleural effusion and hypercarbia.  S: doing well, working with PT, breathing improved, she's has some nasal congestion that's bothering her. No chest pain, wheezing.  O: Today's Vitals   10/16/19 0220 10/16/19 0400 10/16/19 0512 10/16/19 0835  BP:   120/61 (!) 143/66  Pulse:   (!) 58 60  Resp:   16   Temp:   97.6 F (36.4 C) 97.7 F (36.5 C)  TempSrc:   Oral Oral  SpO2:   100% 94%  Weight:   43.4 kg   Height:      PainSc: Asleep Asleep     Body mass index is 19.32 kg/m. Frail elderly woman in NAD Trachea midline, MMM Lung sounds diminished R base Ext without edema but have muscle wasting and arthritic changes Mentating well, oriented  CXR: small residual R effusion, pleural tenting c/w volume loss  A:  # Volume overloaded state of heart improved # Transudative right pleural effusion correlating with volume overload # Severe cardiomyopathy # Severe metabolic alkalosis, cannot figure out whether this is all from heavy doses of loop diuretics or she has chronic respiratory insufficiency, suspect former but may need sleep study. # Muscular deconditioning # Cystitis  - Weaned to RA this AM - Diamox x 1 dose - OP f/u in 2 weeks to check and order PSG with EtCO2 monitoring - qHS BIPAP would be helpful to her from respiratory and cardiac standpoint wherever she ends up going - Will s/o, please call if we can be of further help  Erskine Emery MD PCCM

## 2019-10-16 NOTE — Progress Notes (Signed)
Physical Therapy Treatment Patient Details Name: Barbara Kelley MRN: [id number] DOB: 1934-01-14 Today's Date: 10/16/2019    History of Present Illness Pt is an 86 y.o. female admitted 10/08/19 with acute CHF exacerbation. Pt with large pleural effusion now with hypercarbic respiratory failure requiring transfer to ICU. PMH includes arthritis, cardiomyopathy, CHF, GERD, HTN, PNA, stroke.   PT Comments    Pt progressing well with mobility. Able to perform transfer training, short gait distance and therex with RW and intermittent minA. Pt remains limited by generalized weakness and decreased activity tolerance. SpO2 90-91% on 2.5L O2; pt denies DOE but endorses nasal congestion. Continue to recommend SNF-level therapies to maximize functional mobility and independence; pt in agreement.    Follow Up Recommendations  SNF;Supervision/Assistance - 24 hour     Equipment Recommendations  (defer)    Recommendations for Other Services       Precautions / Restrictions Precautions Precautions: Fall Restrictions Weight Bearing Restrictions: No    Mobility  Bed Mobility Overal bed mobility: Needs Assistance Bed Mobility: Supine to Sit     Supine to sit: Min assist;HOB elevated     General bed mobility comments: Increased time, cues to use bed rail; minA to assist hips to EOB  Transfers Overall transfer level: Needs assistance Equipment used: Rolling walker (2 wheeled) Transfers: Sit to/from Stand Sit to Stand: Min assist;Min guard         General transfer comment: Initial minA for trunk elevation standing from EOB; performed 5x repeated sit<>stand plus additional transfer from recliner, cues for hand placement with good carryover, min guard for balance  Ambulation/Gait Ambulation/Gait assistance: Min guard Gait Distance (Feet): 24 Feet Assistive device: Rolling walker (2 wheeled) Gait Pattern/deviations: Step-to pattern;Trunk flexed Gait velocity: Decreased Gait velocity  interpretation: <1.31 ft/sec, indicative of household ambulator General Gait Details: Amb short distance forwards/backwards for total of 24' with RW and close min guard, intermittent cues for sequencing; SpO2 90-91% on 2.5L O2 [place]. No DOE noted, pt reports nose congestion   Stairs             Wheelchair Mobility    Modified Rankin (Stroke Patients Only)       Balance Overall balance assessment: Needs assistance Sitting-balance support: Bilateral upper extremity supported;Feet unsupported Sitting balance-Leahy Scale: Fair     Standing balance support: Single extremity supported Standing balance-Leahy Scale: Poor                              Cognition Arousal/Alertness: Awake/alert Behavior During Therapy: WFL for tasks assessed/performed Overall Cognitive Status: No family/caregiver present to determine baseline cognitive functioning Area of Impairment: Problem solving;Following commands                       Following Commands: Follows one step commands consistently;Follows multi-step commands inconsistently;Follows multi-step commands with increased time     Problem Solving: Slow processing;Requires verbal cues        Exercises      General Comments        Pertinent Vitals/Pain Pain Assessment: Faces Faces Pain Scale: Hurts a little bit Pain Location: Bilateral feet ("neuropathy") Pain Descriptors / Indicators: Numbness;Discomfort Pain Intervention(s): Monitored during session    Home Living                      Prior Function            PT Goals (current goals  can now be found in the care plan section) Progress towards PT goals: Progressing toward goals    Frequency    Min 2X/week      PT Plan Current plan remains appropriate    Co-evaluation              AM-PAC PT "6 Clicks" Mobility   Outcome Measure  Help needed turning from your back to your side while in a flat bed without using bedrails?: A  Little Help needed moving from lying on your back to sitting on the side of a flat bed without using bedrails?: A Little Help needed moving to and from a bed to a chair (including a wheelchair)?: A Little Help needed standing up from a chair using your arms (e.g., wheelchair or bedside chair)?: A Little Help needed to walk in hospital room?: A Little Help needed climbing 3-5 steps with a railing? : A Little 6 Click Score: 18    End of Session Equipment Utilized During Treatment: Oxygen;Gait belt Activity Tolerance: Patient tolerated treatment well Patient left: in chair;with call bell/phone within reach;with chair alarm set Nurse Communication: Mobility status PT Visit Diagnosis: Muscle weakness (generalized) (M62.81)     Time: [id number] PT Time Calculation (min) (ACUTE ONLY): 28 min  Charges:  $Therapeutic Exercise: 8-22 mins $Therapeutic Activity: 8-22 mins                    Mabeline Caras, PT, DPT Acute Rehabilitation Services  Pager [phone] Office [place] 10/16/2019, 10:08 AM

## 2019-10-16 NOTE — Progress Notes (Signed)
Progress Note  Patient Name: Barbara Kelley Date of Encounter: 10/16/2019  Primary Cardiologist: Kirk Ruths, MD   Subjective   Breathing stable on oxygen. Will wean off. Waiting bed placement.   Inpatient Medications    Scheduled Meds: . carvedilol  6.25 mg Oral BID WC  . chlorhexidine  15 mL Mouth Rinse BID  . Chlorhexidine Gluconate Cloth  6 each Topical Daily  . digoxin  0.0625 mg Oral Daily  . enoxaparin (LOVENOX) injection  40 mg Subcutaneous Q24H  . furosemide  80 mg Oral BID  . mouth rinse  15 mL Mouth Rinse q12n4p  . pantoprazole  40 mg Oral Daily  . sacubitril-valsartan  1 tablet Oral BID  . sodium chloride flush  3 mL Intravenous Q12H  . spironolactone  12.5 mg Oral Daily   Continuous Infusions: . sodium chloride Stopped (10/13/19 0537)  . cefTRIAXone (ROCEPHIN)  IV 1 g (10/15/19 1702)   PRN Meds: sodium chloride, acetaminophen, nitroGLYCERIN, ondansetron (ZOFRAN) IV, sodium chloride, sodium chloride flush   Vital Signs    Vitals:   10/15/19 1701 10/15/19 1937 10/16/19 0512 10/16/19 0835  BP: 129/67 117/65 120/61 (!) 143/66  Pulse: 62 62 (!) 58 60  Resp: 19 16 16    Temp:  99.2 F (37.3 C) 97.6 F (36.4 C) 97.7 F (36.5 C)  TempSrc:  Oral Oral Oral  SpO2: 96% 99% 100% 94%  Weight:   43.4 kg   Height:        Intake/Output Summary (Last 24 hours) at 10/16/2019 0933 Last data filed at 10/16/2019 0530 Gross per 24 hour  Intake 720 ml  Output 2700 ml  Net -1980 ml   Last 3 Weights 10/16/2019 10/15/2019 10/14/2019  Weight (lbs) 95 lb 10.9 oz 102 lb 4.7 oz 102 lb 8.2 oz  Weight (kg) 43.4 kg 46.4 kg 46.5 kg      Telemetry    SR at 70s with PVCs - Personally Reviewed  ECG    No new tracing   Physical Exam  GEN: thin frail female in no acute distress.   Neck: No JVD Cardiac: RRR, no murmurs, rubs, or gallops.  Respiratory: Clear to auscultation bilaterally. GI: Soft, nontender, non-distended  MS: No edema; No deformity. Neuro:  Nonfocal    Psych: Normal affect   Labs    High Sensitivity Troponin:  No results for input(s): TROPONINIHS in the last 720 hours.    Chemistry Recent Labs  Lab 10/09/19 1952 10/12/19 1427 10/12/19 1524 10/14/19 0930 10/15/19 1010 10/16/19 0441  NA 141  --   --  142 141 142  K 5.0  --   --  3.5 4.3 3.7  CL 99  --    < > 84* 83* 81*  CO2 28  --    < > 45* >50* >50*  GLUCOSE 154*  --    < > 87 95 98  BUN 29*  --    < > 25* 19 24*  CREATININE 1.01*  --    < > 0.91 0.77 0.88  CALCIUM 8.9  --    < > 9.1 9.4 9.3  PROT 6.1* 6.0*  --   --   --   --   ALBUMIN 3.5 3.3*  --   --   --   --   AST 65* 23  --   --   --   --   ALT 16 17  --   --   --   --  ALKPHOS 65 55  --   --   --   --   BILITOT 1.6* 1.2  --   --   --   --   GFRNONAA 51*  --    < > 58* >60 >60  GFRAA 60*  --    < > >60 >60 >60  ANIONGAP 14  --    < > 13 NOT CALCULATED NOT CALCULATED   < > = values in this interval not displayed.     Hematology Recent Labs  Lab 10/09/19 1246 10/09/19 1952 10/12/19 1524  WBC 7.6 9.0  --   RBC 4.90 5.13*  --   HGB 14.1 14.6 16.0*  HCT 44.1 46.9* 47.0*  MCV 90.0 91.4  --   MCH 28.8 28.5  --   MCHC 32.0 31.1  --   RDW 15.9* 16.1*  --   PLT 166 169  --     Radiology    No results found.  Cardiac Studies   Echo 10/09/19 1. Left ventricular ejection fraction, by visual estimation, is 10 %. The left ventricle has severely decreased function. There is no left ventricular hypertrophy. 2. Left ventricular diastolic parameters are consistent with Grade II diastolic dysfunction (pseudonormalization). 3. Severely dilated left ventricular internal cavity size. 4. The left ventricle demonstrates global hypokinesis. 5. Global right ventricle has low normal systolic function.The right ventricular size is normal. No increase in right ventricular wall thickness. 6. Left atrial size was severely dilated. 7. Right atrial size was severely dilated. 8. The mitral valve is normal in structure.  Mild to moderate mitral valve regurgitation. 9. The tricuspid valve is grossly normal. 10. The aortic valve is tricuspid. Aortic valve regurgitation is not visualized. No evidence of aortic valve sclerosis or stenosis. 11. The pulmonic valve was normal in structure. Pulmonic valve regurgitation is trivial. 12. Moderately elevated pulmonary artery systolic pressure. 13. The atrial septum is grossly normal.  Patient Profile     86 y.o. female with a hx of chronic systolic heart failure, hypertensive heart disease/NICM, non-rheumatic Mitral Valve Regurgitation, mild CAD admitted with acute CHF exacerbation.   Assessment & Plan    1. Acute on chronic systolic CHF/NICM - Echo this admission showed new low LVEF to 10%, grade 2 DD, severely dilated LV, severe bilateral enlargement, mild to moderate MR and moderate pulmonary hypertension.  - BNP >4500 - Diuresed -8.9L. weight (127>>95lb) - Digoxin reduced to 0.0658mg  daily - Continue Coreg, Entresto and spironolacone. (home morning meds). If stable BP, will up titrate coreg  2. R pleural effusion s/p thoracentesis - breathing improved. Lung clear. Wean off oxygen.  -   3. UTI - Completed 2 days of IV Rocephin, plan to give total 3 days - Will review with pharmacy before sending to facility   4. HTN - BP stable   Dispo: waiting bed placement. COVID negative yesterday.   For questions or updates, please contact [place] Please consult [email or web address] for contact info under        SignedLeanor Kail, PA  10/16/2019, 9:33 AM

## 2019-10-16 NOTE — TOC Transition Note (Addendum)
Transition of Care [facility]) - CM/SW Discharge Note   Patient Details  Name: QUINTANA PESHLAKAI MRN: [id number] Date of Birth: August 12, 1934  [facility]) CM/SW Contact:  Alberteen Sam, LCSW Phone Number: 10/16/2019, 4:08 PM   Clinical Narrative:     Patient will DC to: [facility] Anticipated DC date: 10/16/19 Family notified: [name] Transport [id number] [name]  Per MD patient ready for DC to [facility] . RN, patient, patient's family, and facility notified of DC. Discharge Summary sent to facility. RN given number for report  [phone] Room 304  . DC packet on chart. Daughter [name] to transport to [facility], facility reports this is acceptable. RN informed of plan. CSW signing off.  [place], [place]    Final next level of care: Skilled Nursing Facility Barriers to Discharge: No Barriers Identified   Patient Goals and CMS Choice   CMS Medicare.gov Compare Post Acute Care list provided to:: Patient Represenative (must comment)(daughter [name]) Choice offered to / list presented to : Adult Children  Discharge Placement PASRR number recieved: 10/15/19            Patient chooses bed at: [facility] Patient to be transferred to facility by: daughter [name] Name of family member notified: [name] Patient and family notified of of transfer: 10/16/19  Discharge Plan and Services     Post Acute Care Choice: [place]                               Social Determinants of Health (SDOH) Interventions     Readmission Risk Interventions No flowsheet data found.

## 2019-10-16 NOTE — TOC Progression Note (Signed)
Transition of Care [facility]) - Progression Note    Patient Details  Name: Barbara Kelley MRN: [id number] Date of Birth: September 20, 1934  Transition of Care [facility]) CM/SW [place], [place] Phone Number: [phone] 10/16/2019, 1:34 PM  Clinical Narrative:     CSW spoke with [facility] they report having a bed avail today, however they requested insurance authorization. CSW attempted to explain waiver currently in place for [name] however facility persistent on insurance auth .CSW has informed supervisor [name]. [name] has been initiated with [facility] and all clinicals faxed.   Expected Discharge Plan: [place] Barriers to Discharge: Continued Medical Work up  Expected Discharge Plan and Services Expected Discharge Plan: [place] Choice: [place] arrangements for the past 2 months: Independent Living Facility([place])                                       Social Determinants of Health (SDOH) Interventions    Readmission Risk Interventions No flowsheet data found.

## 2019-10-16 NOTE — Discharge Summary (Signed)
Discharge Summary    Patient ID: Barbara Kelley MRN: [id number]; DOB: 03-16-1934  Admit date: 10/08/2019 Discharge date: 10/16/2019  Primary Care Provider: [name], MD  Primary Cardiologist: Kirk Ruths, MD   Discharge Diagnoses    Principal Problem:   Acute on chronic heart failure [facility]) Active Problems:   S/P thoracentesis   Shortness of breath   HTN   UTI  Diagnostic Studies/Procedures    Echo 10/09/19 1. Left ventricular ejection fraction, by visual estimation, is 10 %%. The left ventricle has severely decreased function. There is no left ventricular hypertrophy.  2. Left ventricular diastolic parameters are consistent with Grade II diastolic dysfunction (pseudonormalization).  3. Severely dilated left ventricular internal cavity size.  4. The left ventricle demonstrates global hypokinesis.  5. Global right ventricle has low normal systolic function.The right ventricular size is normal. No increase in right ventricular wall thickness.  6. Left atrial size was severely dilated.  7. Right atrial size was severely dilated.  8. The mitral valve is normal in structure. Mild to moderate mitral valve regurgitation.  9. The tricuspid valve is grossly normal. 10. The aortic valve is tricuspid. Aortic valve regurgitation is not visualized. No evidence of aortic valve sclerosis or stenosis. 11. The pulmonic valve was normal in structure. Pulmonic valve regurgitation is trivial. 12. Moderately elevated pulmonary artery systolic pressure. 13. The atrial septum is grossly normal.   History of Present Illness     Barbara Kelley is a 86 y.o. female with a hx of chronic systolic heart failure, hypertensive heart disease/NICM, non-rheumatic Mitral Valve Regurgitation and mild CAD admitted for CHF exacerbation.   Barbara Kelley  is followed by Dr. Bettina Gavia. She had a cath in  (not in Epic) which showed mild CAD 30% left main, EF 40%.  Echo in 10/2017 showed EF 20-25% with G2DD, trivial  TR, mild MR, LA and RA mod dilated. Follow-up 2D echo was performed which showed EF 15%, dilated LV, mod LVH, severe LAE, mod RAE, mod MR, mild TR.  The patient was seen 04/05/2019 by Dr. Bettina Gavia for decompensated heart failure. For the last couple months the patient was noticing lower leg edema and orthopnea despite increasing lasix.   She came to ER 1/4 due to worsening symptoms. BNP >4,500.  CXR showed moderate right pleural effusion with adjacent opacities possible atelectasis or infiltrate. EKG showed NSR, 90 bpm, PVC, and nonspecific T wave changes inferior leads, extreme axis deviation.    Hospital Course     Consultants: PCCM/IM  1. Acute on chronic systolic CHF/NICM - Echo this admission showed new low LVEF to 10%, grade 2 DD, severely dilated LV, severe bilateral enlargement, mild to moderate MR and moderate pulmonary hypertension.  - BNP >4500 - Diuresed -8.9L. weight (127>>95lb) - Digoxin reduced to 0.0658mg  daily - Continue Coreg, Entresto and spironolacone.  - Up-titrate medications as outpatient.  -  Lasix reduced to 40mg  BID at discharge. She can take additional lasix 40mg  for dyspnea or edema  2. Lethergy/R pleural effusion s/p thoracentesis -Patient become hypoxic and lethargic on 1.7. Patient was seen by pulmonary and under gone thoracentesis with improved symptoms. Breathing improved. Lung clear. Weaned off oxygen.  - Per Pulmonary "OP f/u in 2 weeks to check and order PSG with EtCO2 monitoring. qHS BIPAP would be helpful to her from respiratory and cardiac standpoint wherever she ends up going". Patient is scheduled to see Dr. Lamonte Sakai on 1/26. Oxygen as needed at facility.   3. UTI - Completed 3  days of IV Rocephin prior to discharge per IM recommendations  4. HTN - BP goes low after morning medications but returns to normal. Reduce lasix at discharge. She looks dry.   Did the patient have an acute coronary syndrome (MI, NSTEMI, STEMI, etc) this admission?:  No                                Did the patient have a percutaneous coronary intervention (stent / angioplasty)?:  No.     Discharge Vitals Blood pressure 127/61, pulse 62, temperature 98.5 F (36.9 C), temperature source Oral, resp. rate 16, height 4\' 11"  (1.499 m), weight 43.4 kg, SpO2 90 %.  Filed Weights   10/14/19 0431 10/15/19 0455 10/16/19 0512  Weight: 46.5 kg 46.4 kg 43.4 kg    Labs & Radiologic Studies    Basic Metabolic Panel Recent Labs    10/15/19 1010 10/16/19 0441  NA 141 142  K 4.3 3.7  CL 83* 81*  CO2 >50* >50*  GLUCOSE 95 98  BUN 19 24*  CREATININE 0.77 0.88  CALCIUM 9.4 9.3   _____________  DG Chest 1 View  Result Date: 10/12/2019 CLINICAL DATA:  Post thoracentesis EXAM: CHEST  1 VIEW COMPARISON:  10/11/2019 FINDINGS: Significant improvement in right pleural effusion which is now small. No pneumothorax. Improved aeration right lung base. Cardiac enlargement. Negative for edema. Mild left lower lobe atelectasis. IMPRESSION: No complication post right thoracentesis. Small residual pleural effusion. Improved aeration right lung base. Electronically Signed   By: Franchot Gallo M.D.   On: 10/12/2019 13:41   DG Chest 2 View  Result Date: 10/11/2019 CLINICAL DATA:  Weakness, shortness of breath EXAM: CHEST - 2 VIEW COMPARISON:  10/08/2019 FINDINGS: Large right pleural effusion with right lower lobe atelectasis or infiltrate. Cardiomegaly. No confluent opacity or effusion on the left. No acute bony abnormality. IMPRESSION: Large right pleural effusion with right lower lobe atelectasis or infiltrate, slightly increasing since prior study. Cardiomegaly. Electronically Signed   By: Rolm Baptise M.D.   On: 10/11/2019 10:54   DG Chest 2 View  Result Date: 10/08/2019 CLINICAL DATA:  C/o generalized weakness since yesterday and SOB. Pt also has CHF EXAM: CHEST - 2 VIEW COMPARISON:  Chest radiograph 12/15/2018 FINDINGS: Stable cardiomediastinal contours with enlarged heart size. There  is a moderate/large right pleural effusion with heterogeneous opacities in the adjacent right lung, possibly atelectasis though infiltrate not excluded. The left lung is clear. No pneumothorax. No acute finding in the visualized skeleton. IMPRESSION: Moderate/large right pleural effusion with adjacent opacities, possibly atelectasis or infiltrate. Electronically Signed   By: Audie Pinto M.D.   On: 10/08/2019 15:08   CT HEAD WO CONTRAST  Result Date: 10/09/2019 CLINICAL DATA:  86 year old female with altered mental status. EXAM: CT HEAD WITHOUT CONTRAST TECHNIQUE: Contiguous axial images were obtained from the base of the skull through the vertex without intravenous contrast. COMPARISON:  Head CT dated 10/18/2017. FINDINGS: Brain: There is mild age-related atrophy and moderate chronic microvascular ischemic changes. 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 There is no acute intracranial hemorrhage. No mass effect or midline shift. No extra-axial fluid collection. Vascular: No hyperdense vessel or unexpected calcification. Skull: Normal. Negative for fracture or focal lesion. Sinuses/Orbits: Bilateral maxillary sinus retention cyst or polyps measuring up to 15 mm on the left. No air-fluid level. The mastoid air cells are clear. Other: None IMPRESSION: 1. No  acute intracranial hemorrhage. 2. Age-related atrophy and chronic microvascular ischemic changes. 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 Electronically Signed   By: Anner Crete M.D.   On: 10/09/2019 21:12   DG CHEST PORT 1 VIEW  Result Date: 10/12/2019 CLINICAL DATA:  Absent breath sounds on left side of chest. EXAM: PORTABLE CHEST 1 VIEW COMPARISON:  Chest x-ray from same day. FINDINGS: Unchanged cardiomegaly. Normal pulmonary vascularity. Unchanged small right pleural effusion and mild bibasilar atelectasis. No pneumothorax. No acute osseous abnormality. IMPRESSION: 1. Unchanged small  right pleural effusion and mild bibasilar atelectasis. Electronically Signed   By: Titus Dubin M.D.   On: 10/12/2019 14:58   ECHOCARDIOGRAM COMPLETE  Result Date: 10/09/2019   ECHOCARDIOGRAM REPORT   Patient Name:   HILMA CALZADO Date of Exam: 10/09/2019 Medical Rec #:  [id number]      Height:       59.0 in Accession #:    [id number]     Weight:       122.1 lb Date of Birth:  1933-12-31      BSA:          1.50 m Patient Age:    86 years       BP:           140/89 mmHg Patient Gender: F              HR:           90 bpm. Exam Location:  Inpatient Procedure: 2D Echo Indications:    Cardiomyopathy-Unspecified 425.9 / I42.9  History:        Patient has prior history of Echocardiogram examinations, most                 recent 05/16/2018. NICM and CHF, CAD, COPD; Risk                 Factors:Hypertension and Dyslipidemia.  Sonographer:    Vikki Ports Turrentine Referring Phys: [id number] [place]  1. Left ventricular ejection fraction, by visual estimation, is 10 %%. The left ventricle has severely decreased function. There is no left ventricular hypertrophy.  2. Left ventricular diastolic parameters are consistent with Grade II diastolic dysfunction (pseudonormalization).  3. Severely dilated left ventricular internal cavity size.  4. The left ventricle demonstrates global hypokinesis.  5. Global right ventricle has low normal systolic function.The right ventricular size is normal. No increase in right ventricular wall thickness.  6. Left atrial size was severely dilated.  7. Right atrial size was severely dilated.  8. The mitral valve is normal in structure. Mild to moderate mitral valve regurgitation.  9. The tricuspid valve is grossly normal. 10. The aortic valve is tricuspid. Aortic valve regurgitation is not visualized. No evidence of aortic valve sclerosis or stenosis. 11. The pulmonic valve was normal in structure. Pulmonic valve regurgitation is trivial. 12. Moderately elevated pulmonary artery  systolic pressure. 13. The atrial septum is grossly normal. FINDINGS  Left Ventricle: Left ventricular ejection fraction, by visual estimation, is 10 %%. The left ventricle has severely decreased function. The left ventricle demonstrates global hypokinesis. The left ventricular internal cavity size was severely dilated left ventricle. There is no left ventricular hypertrophy. Left ventricular diastolic parameters are consistent with Grade II diastolic dysfunction (pseudonormalization). Right Ventricle: The right ventricular size is normal. No increase in right ventricular wall thickness. Global RV systolic function is has low normal systolic function. The tricuspid regurgitant velocity is 2.84 m/s, and with an  assumed right atrial pressure of 15 mmHg, the estimated right ventricular systolic pressure is moderately elevated at 47.2 mmHg. Left Atrium: Left atrial size was severely dilated. Right Atrium: Right atrial size was severely dilated Pericardium: There is no evidence of pericardial effusion. Mitral Valve: The mitral valve is normal in structure. Mild to moderate mitral valve regurgitation. Tricuspid Valve: The tricuspid valve is grossly normal. Tricuspid valve regurgitation is severe. Aortic Valve: The aortic valve is tricuspid. Aortic valve regurgitation is not visualized. The aortic valve is structurally normal, with no evidence of sclerosis or stenosis. Pulmonic Valve: The pulmonic valve was normal in structure. Pulmonic valve regurgitation is trivial. Pulmonic regurgitation is trivial. Aorta: The aortic root and ascending aorta are structurally normal, with no evidence of dilitation. IAS/Shunts: The atrial septum is grossly normal.  LEFT VENTRICLE PLAX 2D LVIDd:         5.90 cm       Diastology LVIDs:         5.40 cm       LV e' lateral:   5.00 cm/s LV PW:         1.00 cm       LV E/e' lateral: 13.0 LV IVS:        1.00 cm LVOT diam:     2.00 cm LV SV:         32 ml LV SV Index:   20.83 LVOT Area:     3.14  cm  LV Volumes (MOD) LV area d, A2C:    32.50 cm LV area d, A4C:    30.80 cm LV area s, A2C:    28.10 cm LV area s, A4C:    26.40 cm LV major d, A2C:   7.40 cm LV major d, A4C:   7.12 cm LV major s, A2C:   6.94 cm LV major s, A4C:   7.03 cm LV vol d, MOD A2C: 123.0 ml LV vol d, MOD A4C: 111.0 ml LV vol s, MOD A2C: 95.2 ml LV vol s, MOD A4C: 86.0 ml LV SV MOD A2C:     27.8 ml LV SV MOD A4C:     111.0 ml LV SV MOD BP:      28.3 ml RIGHT VENTRICLE RV S prime:     7.18 cm/s LEFT ATRIUM              Index       RIGHT ATRIUM           Index LA diam:        4.80 cm  3.21 cm/m  RA Area:     32.40 cm LA Vol (A2C):   105.0 ml 70.21 ml/m RA Volume:   121.00 ml 80.91 ml/m LA Vol (A4C):   74.2 ml  49.62 ml/m LA Biplane Vol: 90.0 ml  60.18 ml/m   AORTA Ao Root diam: 3.00 cm MITRAL VALVE                        TRICUSPID VALVE MV Area (PHT): 3.77 cm             TR Peak grad:   32.2 mmHg MV PHT:        58.29 msec           TR Vmax:        305.00 cm/s MV Decel Time: 201 msec MV E velocity: 65.10 cm/s 103 cm/s  SHUNTS MV A velocity: 72.40 cm/s 70.3 cm/s Systemic  Diam: 2.00 cm MV E/A ratio:  0.90       1.5  Mertie Moores MD Electronically signed by Mertie Moores MD Signature Date/Time: 10/09/2019/3:14:30 PM    Final    Disposition   Pt is being discharged home today in good condition.  Follow-up Plans & Appointments    Follow-up Information    Collene Gobble, MD. Go on 10/31/2019.   Specialty: Pulmonary Disease Why: @1 :45pm for hospital follow up Contact information: [place] [address] [phone]        Deberah Pelton, NP. Go on 10/26/2019.   Specialty: Cardiology Why: @1 :30pm for hospital follow up. Please arrive 15 minutes early  Contact information: [address] [place] [address] [phone]          Discharge Instructions    Diet - low sodium heart healthy   Complete by: As directed    Increase activity slowly   Complete by: As  directed       Discharge Medications   Allergies as of 10/16/2019      Reactions   Doxycycline Nausea Only   Vertigo   Macrobid [nitrofurantoin] Other (See Comments)   Nerve pain   Valsartan Other (See Comments)   Blood pressure rise   Lisinopril Cough   Calcium Channel Blockers Palpitations   Blow pressure rise / tachycardia    Penicillins Rash   Has patient had a PCN reaction causing immediate rash, facial/tongue/throat swelling, SOB or lightheadedness with hypotension: Yes Has patient had a PCN reaction causing severe rash involving mucus membranes or skin necrosis: Yes Has patient had a PCN reaction that required hospitalization: Unk Has patient had a PCN reaction occurring within the last 10 years: No If all of the above answers are "NO", then may proceed with Cephalosporin use.      Medication List    STOP taking these medications   sacubitril-valsartan 24-26 MG Commonly known as: Entresto Replaced by: sacubitril-valsartan 49-51 MG     TAKE these medications   b complex vitamins tablet Take 1 tablet by mouth daily.   Calcium 600 600 MG Tabs tablet Generic drug: calcium carbonate Take 600 mg by mouth at bedtime.   carvedilol 6.25 MG tablet Commonly known as: COREG Take 1 tablet (6.25 mg total) by mouth 2 (two) times daily with a meal. What changed:   medication strength  how much to take   CoQ10 200 MG Caps Take 1 capsule by mouth daily.   Digoxin 62.5 MCG Tabs Take 0.0625 mg by mouth daily. Start taking on: October 17, 2019   fluticasone 50 MCG/ACT nasal spray Commonly known as: FLONASE Place 1-2 sprays into the nose daily as needed for allergies or congestion.   furosemide 40 MG tablet Commonly known as: LASIX Take 1 tablet (40 mg total) by mouth 2 (two) times daily. What changed: how much to take   GINKGO BILOBA PO Take 1 capsule by mouth daily.   multivitamin with minerals tablet Take 1 tablet by mouth daily.   omeprazole 20 MG  capsule Commonly known as: PRILOSEC Take 1 capsule (20 mg total) by mouth daily.   OVER THE COUNTER MEDICATION Take 1 tablet by mouth daily. Starts with a "P" for memory   sacubitril-valsartan 49-51 MG Commonly known as: ENTRESTO Take 1 tablet by mouth 2 (two) times daily. Replaces: sacubitril-valsartan 24-26 MG   spironolactone 25 MG tablet Commonly known as: ALDACTONE Take 0.5 tablets (12.5 mg total) by mouth  daily. Start taking on: October 17, 2019   Vitamin D High Potency 25 MCG (1000 UT) capsule Generic drug: Cholecalciferol Take 1,000 Units by mouth daily.   zinc gluconate 50 MG tablet Take 50 mg by mouth daily.          Outstanding Labs/Studies   None  Duration of Discharge Encounter   Greater than 30 minutes including physician time.  Jarrett Soho, PA 10/16/2019, 2:34 PM

## 2019-10-17 ENCOUNTER — Encounter: Payer: Self-pay | Admitting: Internal Medicine

## 2019-10-17 ENCOUNTER — Non-Acute Institutional Stay (SKILLED_NURSING_FACILITY): Payer: Medicare PPO | Admitting: Internal Medicine

## 2019-10-17 DIAGNOSIS — Z9889 Other specified postprocedural states: Secondary | ICD-10-CM | POA: Diagnosis not present

## 2019-10-17 DIAGNOSIS — I5043 Acute on chronic combined systolic (congestive) and diastolic (congestive) heart failure: Secondary | ICD-10-CM

## 2019-10-17 NOTE — Assessment & Plan Note (Addendum)
Presently stable w/o CHF Continue Lasix twice daily with as needed dose for increasing edema or orthopnea.

## 2019-10-17 NOTE — Patient Instructions (Signed)
See assessment and plan under each diagnosis in the problem list and acutely for this visit 

## 2019-10-17 NOTE — Assessment & Plan Note (Addendum)
Supplemental O2 at SNF to maintain O2 sats > 90% (94% today).

## 2019-10-17 NOTE — Progress Notes (Signed)
NURSING HOME LOCATION:  [facility] ROOM NUMBER:  [id number]  CODE STATUS:  FULL CODE  PCP:  [name], [id number] [place] [id number]   This is a comprehensive admission note to [facility] performed on this date less than 30 days from date of admission. Included are preadmission medical/surgical history; reconciled medication list; family history; social history and comprehensive review of systems.  Corrections and additions to the records were documented. Comprehensive physical exam was also performed. Additionally a clinical summary was entered for each active diagnosis pertinent to this admission in the Problem List to enhance continuity of care.  HPI: Patient was hospitalized 1/3-1/08/2020 with acute on chronic heart failure in the context of severe reduction in LV EF to 10% with global hypokinesis but without associated hypertrophy.  She presented to the ED on 1/4 due to worsening symptoms; she had noted progressive lower extremity edema and orthopnea despite increasing Lasix dose.  BNP was greater than 4500.  Chest x-ray showed moderate right pleural effusion with adjacent opacity suggestive of atelectasis or infiltrate. Echo revealed LV diastolic parameters consistent with grade 2 diastolic dysfunction.  Both atria are severely dilated.  Mild-moderate mitral regurgitation is present.  Pulmonary artery pressure is moderately elevated. She was aggressively diuresed of 8.9 L with weight dropping from 127 pounds to 95 pounds.  Digoxin dose was adjusted.  Coreg, Entresto and spironolactone were not continued.   Pulmonary consulted for lethargy and hypoxia; thoracentesis was performed  with clinical improvement.  Supplemental oxygen was able to be weaned.  Outpatient follow-up for PSG and EtCO2 monitoring was scheduled with Dr. Lamonte Sakai on 1/26. She received 3 days of IV Rocephin for UTI. It was noted that she had a drop in her blood pressure after her morning  medication but this would resolve through the day.Lasix was reduced to 40 mg twice daily at discharge with an additional as needed 40 mg for dyspnea or edema.   Past medical and surgical history: Includes history of stroke, history of community-acquired pneumonia, essential hypertension, GERD, and cardiomyopathy with congestive heart failure. Surgeries include hysterectomy, cholecystectomy, and hip and knee surgery.Prior cath had revealed mild CAD with less than 30% left main involvement.  Social history: Nondrinker; never smoked.  Family history: Reviewed, noncontributory because of advanced age.   Review of systems: She states that her breathing is better and her swelling is much better.  She denies any active cardiopulmonary symptoms; but has had an intermittent cough prior to admission.  She states that she would wake up coughing, usually nonproductively.  She also would have nasal congestion.  She describes occasional watering of the left eye.  She describes dry lips as a chronic issue as well.  She states that she must reposition herself in bed as she has left sided discomfort from the mid thoracic spine to the mid axillary line.  She describes neuropathy on the left side with intermittent burning pain.  She denies anxiety or depressions stating that she is "hopeful".  Constitutional: No fever Eyes: No redness, discharge, pain, vision change ENT/mouth: No nasal congestion, purulent discharge, earache, change in hearing, sore throat  Cardiovascular: No chest pain, palpitations, claudication Respiratory: No hemoptysis, significant snoring, apnea Gastrointestinal: No heartburn, dysphagia, abdominal pain, nausea /vomiting, rectal bleeding, melena, change in bowels Genitourinary: No dysuria, hematuria, pyuria, incontinence, nocturia Dermatologic: No rash, pruritus, change in appearance of skin Neurologic: No dizziness, headache, syncope, seizures Psychiatric: No  insomnia, anorexia Endocrine:  No change in hair/nails,  excessive thirst, excessive hunger, excessive urination  Hematologic/lymphatic: No significant bruising, lymphadenopathy, abnormal bleeding Allergy/immunology: No significant sneezing, urticaria, angioedema  Physical exam:  Pertinent or positive findings: She appears frail and suboptimally nourished.  Hair is disheveled.  She has minor scattered rales.  Heart rate is slow and rhythm regular.Posterior tibial pulses are stronger than the dorsalis pedis pulses.  There is trace edema at the sock line.  She has flexion contractures of the fifth fingers bilaterally, greater on the right.  She is weak to opposition.  General appearance: no acute distress, increased work of breathing is present.   Lymphatic: No lymphadenopathy about the head, neck, axilla. Eyes: No conjunctival inflammation or lid edema is present. There is no scleral icterus. Ears:  External ear exam shows no significant lesions or deformities.   Nose:  External nasal examination shows no deformity or inflammation. Nasal mucosa are pink and moist without lesions, exudates Oral exam: Lips and gums are healthy appearing.There is no oropharyngeal erythema or exudate. Neck:  No thyromegaly, masses, tenderness noted.    Heart:  No gallop, murmur, click, rub.  Lungs:  without wheezes, rhonchi, rubs. Abdomen: Bowel sounds are normal.  Abdomen is soft and nontender with no organomegaly, hernias, masses. GU: Deferred  Extremities:  No cyanosis, clubbing. Neurologic exam:  Balance, Rhomberg, finger to nose testing could not be completed due to clinical state Skin: Warm & dry w/o tenting. No significant lesions or rash.  See clinical summary under each active problem in the Problem List with associated updated therapeutic plan

## 2019-10-18 ENCOUNTER — Telehealth: Payer: Self-pay | Admitting: Cardiology

## 2019-10-18 NOTE — Telephone Encounter (Signed)
Spoke with the front desk at [facility], no one picked up on her floor, requested copy of medication list be faxed to me.

## 2019-10-18 NOTE — Telephone Encounter (Signed)
Patient's daughter calling with questions about the patient's last hospital visit. She states the patient does not remember much about what happened in the hospital. She would like to know what Dr. Jacalyn Lefevre recommendations were and if their are any medication changes. She also says the patient is in rehab now and is on oxygen. [name] would also like to know if the oxygen is for short term or long term use.

## 2019-10-18 NOTE — Telephone Encounter (Signed)
Spoke with Barbara Kelley, questions regarding recent hospital and folow up care answered. She is concerned regarding medications and requested we reach out to [facility] to make sure the medications are correct.

## 2019-10-24 ENCOUNTER — Telehealth: Payer: Self-pay | Admitting: General Practice

## 2019-10-24 NOTE — Telephone Encounter (Signed)
New Message    Barbara Kelley is calling and says she has a questions about an appt on Thursday  She also has questions about speech therapy    Please advise

## 2019-10-24 NOTE — Telephone Encounter (Signed)
Returned call to daughter (ok per DPR).  She states her mother is a patient of Dr. Bettina Gavia but would really like to see Dr. [name] due to location/avaliability and him caring for her mother in the hospital recently.      Patient has appt Thursday with Coletta Memos NP for hospital follow up and daughter is aware she cannot be here with her.   She has a lot of questions and concerns and request to be called and on phone during appt.   Advised this could be arranged and I would make NP and primary aware.    Daughter aware and verbalized understanding.

## 2019-10-24 NOTE — Progress Notes (Deleted)
Cardiology Clinic Note   Patient Name: BRYLEY MAMER Date of Encounter: 10/24/2019  Primary Care Provider:  [name], MD Primary Cardiologist:  Kirk Ruths, MD  Patient Profile    ROMEKA BLACKWOOD 86 year old female presents today for follow-up evaluation of her and ICM chronic systolic heart failure hypertensive heart disease without CHF near syncope, and mitral valve regurgitation.  Past Medical History    Past Medical History:  Diagnosis Date  . Arthritis   . Cardiomyopathy ([place])   . Congestive heart failure (CHF) ([place])   . Dyspnea   . GERD (gastroesophageal reflux disease)    currently non problematic   . Hypertension   . 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 Pneumonia    x2 most recent fall of 2017   . PONV (postoperative nausea and vomiting)   . 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 ABDOMINAL HYSTERECTOMY    . APPENDECTOMY    . BLADDER SUSPENSION    . CARDIAC CATHETERIZATION  10/2015  . CHOLECYSTECTOMY    . HEMIARTHROPLASTY HIP Right 10/20/2017  . HIP ARTHROPLASTY Right 10/19/2017   Procedure: ARTHROPLASTY BIPOLAR HIP (HEMIARTHROPLASTY);  Surgeon: Paralee Cancel, MD;  Location: [place];  Service: Orthopedics;  Laterality: Right;  . JOINT REPLACEMENT Left 2013   left hip  . TONSILLECTOMY    . TOTAL KNEE ARTHROPLASTY Left 05/04/2017   Procedure: LEFT TOTAL KNEE ARTHROPLASTY;  Surgeon: Paralee Cancel, MD;  Location: [facility];  Service: Orthopedics;  Laterality: Left;  70 mins  . TUBAL LIGATION      Allergies  Allergies  Allergen Reactions  . Doxycycline Nausea Only    Vertigo  . Macrobid [Nitrofurantoin] Other (See Comments)    Nerve pain  . Valsartan Other (See Comments)    Blood pressure rise  . Lisinopril Cough  . Calcium Channel Blockers Palpitations    Blow pressure rise / tachycardia   . Penicillins Rash    Has patient had a PCN  reaction causing immediate rash, facial/tongue/throat swelling, SOB or lightheadedness with hypotension: Yes Has patient had a PCN reaction causing severe rash involving mucus membranes or skin necrosis: Yes Has patient had a PCN reaction that required hospitalization: Unk Has patient had a PCN reaction occurring within the last 10 years: No If all of the above answers are "NO", then may proceed with Cephalosporin use.     History of Present Illness    Ms. Ehresmann past medical history of chronic systolic heart failure, hypertensive heart disease/nonischemic cardiomyopathy, nonrheumatic mitral valve regurgitation, and mild coronary artery disease.  She was recently admitted 10/08/2019-10/16/2019 for CHF exacerbation.  She underwent cardiac catheterization (not available in epic) which showed mild CAD 30% left main and then ejection fraction of 40%.  Her echocardiogram 10/2017 showed EF 20-25% with grade 2 diastolic dysfunction, trivial tricuspid regurg, mild MR, LA and RA moderately dilated.  A follow-up echocardiogram was performed which showed an EF of 15% dilated LV, moderate LVH, severe LAE, moderate RAE, moderate MR, mild TR.  She was seen by Dr. Bettina Gavia 04/05/2019 for decompensated heart failure.  For the last couple months she has been noticing increased lower extremity edema and orthopnea despite increasing her furosemide.  She presents to the emergency department on 10/09/2019 due to worsening CHF symptoms.  Her BNP at that time was greater than 4500.  Her  EKG showed normal sinus rhythm 90 bpm, PVCs and nonspecific T wave changes inferior leads.  Her echocardiogram during admission showed new low LVEF at [id number], grade 2 diastolic dysfunction severely dilated LV, severe bilateral enlargement, mild to moderate MR and moderate pulmonary hypertension.  She was diuresed 8.9 L, admitted 127 pounds, discharged 95 pounds.  Her digoxin was reduced to 0.0658 mg daily.  Her carvedilol and Entresto and spironolactone  were continued.  Her Lasix was reduced to 40 mg twice daily and she was instructed to take an additional 40 mg of Lasix for dyspnea or edema.  She also underwent thoracentesis for right pleural effusion.  She was hypoxic and lethargic on 10/12/2019, was seen by pulmonary and underwent thoracentesis her symptoms subsequently improved.  She was then weaned off O2.  She was instructed to follow-up with pulmonary 2 weeks use at bedtime BiPAP.  Scheduled to see Dr. Lamonte Sakai 10/31/2019.  She presents the clinic today and states***  *** denies chest pain, shortness of breath, lower extremity edema, fatigue, palpitations, melena, hematuria, hemoptysis, diaphoresis, weakness, presyncope, syncope, orthopnea, and PND.     Home Medications    Prior to Admission medications   Medication Sig Start Date End Date Taking? Authorizing Provider  b complex vitamins tablet Take 1 tablet by mouth daily.    [provider]  bisacodyl (DULCOLAX) 10 MG suppository Place 10 mg rectally daily as needed for moderate constipation (If not relieved by MOM).    [provider]  calcium carbonate (CALCIUM 600) 600 MG TABS tablet Take 600 mg by mouth at bedtime.     [provider]  carvedilol (COREG) 6.25 MG tablet Take 1 tablet (6.25 mg total) by mouth 2 (two) times daily with a meal. 10/16/19   Bhagat, Bhavinkumar, PA  Cholecalciferol (VITAMIN D HIGH POTENCY) 25 MCG (1000 UT) capsule Take 1,000 Units by mouth daily.    [provider]  Coenzyme Q10 (COQ10) 200 MG CAPS Take 1 capsule by mouth daily.    [provider]  digoxin 62.5 MCG TABS Take 0.0625 mg by mouth daily. 10/17/19   Bhagat, Crista Luria, PA  fluticasone (FLONASE) 50 MCG/ACT nasal spray Place 2 sprays into the nose daily as needed for allergies or congestion.  07/11/19 07/10/20  [provider]  furosemide (LASIX) 40 MG tablet Take 1 tablet (40 mg total) by mouth 2 (two) times daily. 10/16/19   Bhagat, Bhavinkumar, PA   GINKGO BILOBA PO Take 1 capsule by mouth daily.     [provider]  magnesium hydroxide (MILK OF MAGNESIA) 400 MG/5ML suspension Take 30 mLs by mouth daily as needed for mild constipation.    [provider]  Multiple Vitamins-Minerals (MULTIVITAMIN WITH MINERALS) tablet Take 1 tablet by mouth daily.    [provider]  omeprazole (PRILOSEC) 20 MG capsule Take 1 capsule (20 mg total) by mouth daily. 05/08/19   Collene Gobble, MD  sacubitril-valsartan (ENTRESTO) 49-51 MG Take 1 tablet by mouth 2 (two) times daily. 10/16/19   Bhagat, Crista Luria, PA  Sodium Phosphates (RA SALINE ENEMA RE) Place 1 each rectally daily as needed.    [provider]  spironolactone (ALDACTONE) 25 MG tablet Take 0.5 tablets (12.5 mg total) by mouth daily. 10/17/19   Bhagat, Crista Luria, PA  zinc gluconate 50 MG tablet Take 50 mg by mouth daily.    [provider]    Family History    Family History  Problem Relation Age of Onset  . Diabetes Mother   .  Ovarian cancer Mother   . Lung cancer Father   . Diabetes Sister   . Stroke Maternal Grandfather    She indicated that the status of her mother is unknown. She indicated that the status of her father is unknown. She indicated that the status of her sister is unknown. She indicated that the status of her maternal grandfather is unknown.  Social History    Social History   Socioeconomic History  . Marital status: Widowed    Spouse name: Not on file  . Number of children: Not on file  . Years of education: Not on file  . Highest education level: Not on file  Occupational History  . Not on file  Tobacco Use  . Smoking status: Never Smoker  . Smokeless tobacco: Never Used  Substance and Sexual Activity  . Alcohol use: No  . Drug use: No  . Sexual activity: Not on file  Other Topics Concern  . Not on file  Social History Narrative  . Not on file   Social Determinants of Health   Financial Resource Strain:    . Difficulty of Paying Living Expenses: Not on file  Food Insecurity:   . Worried About Charity fundraiser in the Last Year: Not on file  . Ran Out of Food in the Last Year: Not on file  Transportation Needs:   . Lack of Transportation (Medical): Not on file  . Lack of Transportation (Non-Medical): Not on file  Physical Activity:   . Days of Exercise per Week: Not on file  . Minutes of Exercise per Session: Not on file  Stress:   . Feeling of Stress : Not on file  Social Connections:   . Frequency of Communication with Friends and Family: Not on file  . Frequency of Social Gatherings with Friends and Family: Not on file  . Attends Religious Services: Not on file  . Active Member of Clubs or Organizations: Not on file  . Attends Archivist Meetings: Not on file  . Marital Status: Not on file  Intimate Partner Violence:   . Fear of Current or Ex-Partner: Not on file  . Emotionally Abused: Not on file  . Physically Abused: Not on file  . Sexually Abused: Not on file     Review of Systems    General:  No chills, fever, night sweats or weight changes.  Cardiovascular:  No chest pain, dyspnea on exertion, edema, orthopnea, palpitations, paroxysmal nocturnal dyspnea. Dermatological: No rash, lesions/masses Respiratory: No cough, dyspnea Urologic: No hematuria, dysuria Abdominal:   No nausea, vomiting, diarrhea, bright red blood per rectum, melena, or hematemesis Neurologic:  No visual changes, wkns, changes in mental status. All other systems reviewed and are otherwise negative except as noted above.  Physical Exam    VS:  There were no vitals taken for this visit. , BMI There is no height or weight on file to calculate BMI. GEN: Well nourished, well developed, in no acute distress. HEENT: normal. Neck: Supple, no JVD, carotid bruits, or masses. Cardiac: RRR, no murmurs, rubs, or gallops. No clubbing, cyanosis, edema.  Radials/DP/PT 2+ and equal bilaterally.   Respiratory:  Respirations regular and unlabored, clear to auscultation bilaterally. GI: Soft, nontender, nondistended, BS + x 4. MS: no deformity or atrophy. Skin: warm and dry, no rash. Neuro:  Strength and sensation are intact. Psych: Normal affect.  Accessory Clinical Findings    ECG personally reviewed by me today- *** - No acute changes  EKG  10/10/2019 Sinus rhythm with occasional PVCs anterior infarct undetermined age 11 bpm  Echocardiogram 10/09/2019 1. Left ventricular ejection fraction, by visual estimation, is 10 %. The left ventricle has severely decreased function. There is no left ventricular hypertrophy. 2. Left ventricular diastolic parameters are consistent with Grade II diastolic dysfunction (pseudonormalization). 3. Severely dilated left ventricular internal cavity size. 4. The left ventricle demonstrates global hypokinesis. 5. Global right ventricle has low normal systolic function.The right ventricular size is normal. No increase in right ventricular wall thickness. 6. Left atrial size was severely dilated. 7. Right atrial size was severely dilated. 8. The mitral valve is normal in structure. Mild to moderate mitral valve regurgitation. 9. The tricuspid valve is grossly normal. 10. The aortic valve is tricuspid. Aortic valve regurgitation is not visualized. No evidence of aortic valve sclerosis or stenosis. 11. The pulmonic valve was normal in structure. Pulmonic valve regurgitation is trivial. 12. Moderately elevated pulmonary artery systolic pressure. 13. The atrial septum is grossly normal.  Assessment & Plan   1.  Acute on chronic systolic heart failure/and ICM-echocardiogram during recent admission showed LVEF [id number], grade 2 diastolic dysfunction, severely dilated LV, severe bilateral enlargement, mild to moderate MR and moderate pulmonary hypertension.  She was diuresed 8.9 L and went from 127 pounds - 95 pounds at discharge. Continue digoxin Continue  carvedilol Continue Entresto Continue spironolactone Continue Lasix 40 mg twice daily-may take an additional 40 mg for dyspnea or lower extremity edema Daily weights Heart healthy low-sodium diet-salty 6 given Increase physical activity as tolerated  Shortness of breath-had thoracentesis 10/12/2019 which improved her symptoms and allow her to wean off oxygen. Continue at bedtime BiPAP Follow-up with Dr. Lamonte Sakai 10/31/2019 Increase physical activity as tolerated Heart healthy low-sodium diet  Essential hypertension-BP today***.  Lasix reduced at discharge due to blood pressure decreasing after morning medications and return to normal later in the day. Continue carvedilol 6.25 mg twice daily Continue furosemide 40 mg twice daily  Disposition: Follow-up with Dr. Bettina Gavia in 2 months.   Jossie Ng. [facility] [address] Office [phone] Fax [phone]

## 2019-10-25 ENCOUNTER — Encounter: Payer: Self-pay | Admitting: Adult Health

## 2019-10-25 ENCOUNTER — Non-Acute Institutional Stay (SKILLED_NURSING_FACILITY): Payer: Medicare PPO | Admitting: Adult Health

## 2019-10-25 DIAGNOSIS — Z9889 Other specified postprocedural states: Secondary | ICD-10-CM | POA: Diagnosis not present

## 2019-10-25 DIAGNOSIS — I5022 Chronic systolic (congestive) heart failure: Secondary | ICD-10-CM

## 2019-10-25 NOTE — Progress Notes (Signed)
Location:  [facility] Room Number: 304/B Place of Service:  SNF (31) Provider:  Durenda Age, DNP, FNP-BC  Patient Care Team: [name], MD as PCP - General (Family Medicine) [name] [name], MD as PCP - Cardiology (Cardiology) [name], MD as Consulting Physician (Cardiology)  Extended Emergency Contact Information Primary Emergency Contact: [name] of [place] Phone: [phone] Mobile Phone: [phone] Relation: Daughter  Code Status:  Full Code  Goals of care: Advanced Directive information Advanced Directives 10/25/2019  Does Patient Have a Medical Advance Directive? Yes  Type of Advance Directive (No Data)  Does patient want to make changes to medical advance directive? No - Patient declined  Copy of [place] in Chart? -  Would patient like information on creating a medical advance directive? -     Chief Complaint  Patient presents with  . Acute Visit    Hospitalization Follow Up    HPI:  Pt is a 86 y.o. female who has PMH of chronic systolic heart failure, hypertensive heart disease, nonrheumatic mitral valve regurgitation and mild CAD. She was admitted to [facility] on 10/16/19 post hospitalization 10/08/19 to 10/16/19 for acute on chronic heart failure. She was having worsening lower leg edema and orthopnea despite increasing Lasix. In the ER she had BNP >4,500.  Chest x-ray showed moderate right pleural effusion with adjacent opacities possible atelectasis or infiltrate.  EKG showed NSR, 90 bpm, PVC and nonspecific T wave changes inferior leads, extreme axis deviation.  She was diuresed -8.9L, weight 127 >>95 lbs.  Lasix was reduced to 40 mg twice a day at discharge. Pulmonology was consulted and performed thoracentesis with improved symptoms  She is currently having short-term rehabilitation. She is on O2 @ 2L/min continuously with O2 sat 96%.    Past Medical History:  Diagnosis Date  . Arthritis   . Cardiomyopathy ([place])   . Congestive heart failure (CHF) ([place])   . Dyspnea   . GERD (gastroesophageal reflux disease)    currently non problematic   . Hypertension   . 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 Pneumonia    x2 most recent fall of 2017   . PONV (postoperative nausea and vomiting)   . 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 ABDOMINAL HYSTERECTOMY    . APPENDECTOMY    . BLADDER SUSPENSION    . CARDIAC CATHETERIZATION  10/2015  . CHOLECYSTECTOMY    . HEMIARTHROPLASTY HIP Right 10/20/2017  . HIP ARTHROPLASTY Right 10/19/2017   Procedure: ARTHROPLASTY BIPOLAR HIP (HEMIARTHROPLASTY);  Surgeon: Paralee Cancel, MD;  Location: [facility];  Service: Orthopedics;  Laterality: Right;  . JOINT REPLACEMENT Left 2013   left hip  . TONSILLECTOMY    . TOTAL KNEE ARTHROPLASTY Left 05/04/2017   Procedure: LEFT TOTAL KNEE ARTHROPLASTY;  Surgeon: Paralee Cancel, MD;  Location: [facility];  Service: Orthopedics;  Laterality: Left;  70 mins  . TUBAL LIGATION      Allergies  Allergen Reactions  . Doxycycline Nausea Only    Vertigo  . Macrobid [Nitrofurantoin] Other (See Comments)    Nerve pain  . Valsartan Other (See Comments)    Blood pressure rise  . Lisinopril Cough  . Calcium Channel Blockers Palpitations    Blow pressure rise / tachycardia   . Penicillins Rash  Has patient had a PCN reaction causing immediate rash, facial/tongue/throat swelling, SOB or lightheadedness with hypotension: Yes Has patient had a PCN reaction causing severe rash involving mucus membranes or skin necrosis: Yes Has patient had a PCN reaction that required hospitalization: Unk Has patient had a PCN reaction occurring within the last 10 years: No If all of the above answers are "NO", then may proceed with Cephalosporin use.     Outpatient  Encounter Medications as of 10/25/2019  Medication Sig  . b complex vitamins tablet Take 1 tablet by mouth daily.  . bisacodyl (DULCOLAX) 10 MG suppository Place 10 mg rectally daily as needed for moderate constipation (If not relieved by MOM).  . calcium carbonate (CALCIUM 600) 600 MG TABS tablet Take 600 mg by mouth at bedtime.   . carvedilol (COREG) 6.25 MG tablet Take 1 tablet (6.25 mg total) by mouth 2 (two) times daily with a meal.  . Cholecalciferol (VITAMIN D HIGH POTENCY) 25 MCG (1000 UT) capsule Take 1,000 Units by mouth daily.  . Coenzyme Q10 (COQ10) 200 MG CAPS Take 1 capsule by mouth daily.  . digoxin 62.5 MCG TABS Take 0.0625 mg by mouth daily.  . fluticasone (FLONASE) 50 MCG/ACT nasal spray Place 2 sprays into the nose daily as needed for allergies or congestion.   . furosemide (LASIX) 40 MG tablet Take 1 tablet (40 mg total) by mouth 2 (two) times daily.  Marland Kitchen GINKGO BILOBA PO Take 1 capsule by mouth daily.   . magnesium hydroxide (MILK OF MAGNESIA) 400 MG/5ML suspension Take 30 mLs by mouth daily as needed for mild constipation.  . Multiple Vitamins-Minerals (MULTIVITAMIN WITH MINERALS) tablet Take 1 tablet by mouth daily.  . NON FORMULARY ADD MEDPASS 120ML TWO TIMES A DAY  . sacubitril-valsartan (ENTRESTO) 49-51 MG Take 1 tablet by mouth 2 (two) times daily.  . Sodium Phosphates (RA SALINE ENEMA RE) Place 1 each rectally daily as needed.  Marland Kitchen spironolactone (ALDACTONE) 25 MG tablet Take 0.5 tablets (12.5 mg total) by mouth daily.  Marland Kitchen zinc gluconate 50 MG tablet Take 50 mg by mouth daily.  . [DISCONTINUED] omeprazole (PRILOSEC) 20 MG capsule Take 1 capsule (20 mg total) by mouth daily.   No facility-administered encounter medications on file as of 10/25/2019.    Review of Systems  GENERAL: No change in appetite, no fatigue, no weight changes, no fever, chills or weakness SKIN: Denies rash, itching, wounds, ulcer sores, or nail abnormalities EYES: Denies change in vision, dry  eyes, eye pain, itching or discharge EARS: Denies change in hearing, ringing in ears, or earache NOSE: Denies nasal congestion or epistaxis MOUTH and THROAT: Denies oral discomfort, gingival pain or bleeding, pain from teeth or hoarseness   RESPIRATORY: no cough, SOB, DOE, wheezing, hemoptysis CARDIAC: No chest pain, edema or palpitations GI: No abdominal pain, diarrhea, constipation, heart burn, nausea or vomiting GU: Denies dysuria, frequency, hematuria, incontinence, or discharge MUSCULOSKELETAL: Denies joint pain, muscle pain, back pain, restricted movement, or unusual weakness CIRCULATION: Denies claudication, edema of legs, varicosities, or cold extremities NEUROLOGICAL: Denies dizziness, syncope, numbness, or headache PSYCHIATRIC: Denies feelings of depression or anxiety. No report of hallucinations, insomnia, paranoia, or agitation ENDOCRINE: Denies polyphagia, polyuria, polydipsia, heat or cold intolerance HEME/LYMPH: Denies excessive bruising, petechia, enlarged lymph nodes, or bleeding problems IMMUNOLOGIC: Denies history of frequent infections, AIDS, or use of immunosuppressive agents   Immunization History  Administered Date(s) Administered  . Influenza, High Dose Seasonal PF 07/06/2018  . Influenza,inj,quad, With Preservative 07/05/2017  .  Influenza-Unspecified 06/06/2019   Pertinent  Health Maintenance Due  Topic Date Due  . DEXA SCAN  07/13/2001  . PNA vac Low Risk Adult (1 of 2 - PCV13) 07/13/2001  . INFLUENZA VACCINE  Completed   No flowsheet data found.   Vitals:   10/25/19 1049  BP: 128/63  Pulse: (!) 52  Resp: 18  Temp: 97.9 F (36.6 C)  TempSrc: Oral  SpO2: 94%  Weight: 99 lb 4.8 oz (45 kg)  Height: 4\' 11"  (1.499 m)   Body mass index is 20.06 kg/m.  Physical Exam  GENERAL APPEARANCE: Well nourished. In no acute distress. Normal body habitus SKIN:  Skin is warm and dry. There are no suspicious lesions or rash HEAD: Normal in size and contour. No  evidence of trauma EYES: Lids open and close normally. No blepharitis, entropion or ectropion. PERRL. Conjunctivae are clear and sclerae are white. Lenses are without opacity EARS: Pinnae are normal. Patient hears normal voice tunes of the examiner MOUTH and THROAT: Lips are without lesions. Oral mucosa is moist and without lesions. Tongue is normal in shape, size, and color and without lesions NECK: supple, trachea midline, no neck masses, no thyroid tenderness, no thyromegaly LYMPHATICS: No LAN in the neck, no supraclavicular LAN RESPIRATORY: Breathing is even & unlabored, BS CTAB CARDIAC: RRR, no murmur,no extra heart sounds, no edema GI: Abdomen soft, normal BS, no masses, no tenderness, no hepatomegaly, no splenomegaly MUSCULOSKELETAL: No deformities. Movement at each extremity is full and painless. Strength is 5/5 at each extremity. Back is without kyphosis or scoliosis CIRCULATION: Pedal pulses are 2+. There is no edema of the legs, ankles and feet NEUROLOGICAL: There is no tremor. Speech is clear PSYCHIATRIC: Alert and oriented X 3. Affect and behavior are appropriate  Labs reviewed: Recent Labs    10/09/19 1246 10/09/19 1952 10/14/19 0930 10/15/19 1010 10/16/19 0441  NA  --    < > 142 141 142  K  --    < > 3.5 4.3 3.7  CL  --    < > 84* 83* 81*  CO2  --    < > 45* >50* >50*  GLUCOSE  --    < > 87 95 98  BUN  --    < > 25* 19 24*  CREATININE 0.82   < > 0.91 0.77 0.88  CALCIUM  --    < > 9.1 9.4 9.3  MG 2.2  --   --   --   --    < > = values in this interval not displayed.   Recent Labs    10/09/19 1952 10/12/19 1427  AST 65* 23  ALT 16 17  ALKPHOS 65 55  BILITOT 1.6* 1.2  PROT 6.1* 6.0*  ALBUMIN 3.5 3.3*   Recent Labs    10/08/19 1446 10/08/19 1446 10/09/19 1246 10/09/19 1952 10/12/19 1524  WBC 8.3  --  7.6 9.0  --   HGB 15.3*   < > 14.1 14.6 16.0*  HCT 47.6*   < > 44.1 46.9* 47.0*  MCV 91.9  --  90.0 91.4  --   PLT 149*  --  166 169  --    < > = values  in this interval not displayed.   Lab Results  Component Value Date   TSH 1.725 10/09/2019    Significant Diagnostic Results in last 30 days:  DG Chest 1 View  Result Date: 10/12/2019 CLINICAL DATA:  Post thoracentesis EXAM: CHEST  1 VIEW  COMPARISON:  10/11/2019 FINDINGS: Significant improvement in right pleural effusion which is now small. No pneumothorax. Improved aeration right lung base. Cardiac enlargement. Negative for edema. Mild left lower lobe atelectasis. IMPRESSION: No complication post right thoracentesis. Small residual pleural effusion. Improved aeration right lung base. Electronically Signed   By: Franchot Gallo M.D.   On: 10/12/2019 13:41   DG Chest 2 View  Result Date: 10/11/2019 CLINICAL DATA:  Weakness, shortness of breath EXAM: CHEST - 2 VIEW COMPARISON:  10/08/2019 FINDINGS: Large right pleural effusion with right lower lobe atelectasis or infiltrate. Cardiomegaly. No confluent opacity or effusion on the left. No acute bony abnormality. IMPRESSION: Large right pleural effusion with right lower lobe atelectasis or infiltrate, slightly increasing since prior study. Cardiomegaly. Electronically Signed   By: Rolm Baptise M.D.   On: 10/11/2019 10:54   DG Chest 2 View  Result Date: 10/08/2019 CLINICAL DATA:  C/o generalized weakness since yesterday and SOB. Pt also has CHF EXAM: CHEST - 2 VIEW COMPARISON:  Chest radiograph 12/15/2018 FINDINGS: Stable cardiomediastinal contours with enlarged heart size. There is a moderate/large right pleural effusion with heterogeneous opacities in the adjacent right lung, possibly atelectasis though infiltrate not excluded. The left lung is clear. No pneumothorax. No acute finding in the visualized skeleton. IMPRESSION: Moderate/large right pleural effusion with adjacent opacities, possibly atelectasis or infiltrate. Electronically Signed   By: Audie Pinto M.D.   On: 10/08/2019 15:08   CT HEAD WO CONTRAST  Result Date: 10/09/2019 CLINICAL  DATA:  86 year old female with altered mental status. EXAM: CT HEAD WITHOUT CONTRAST TECHNIQUE: Contiguous axial images were obtained from the base of the skull through the vertex without intravenous contrast. COMPARISON:  Head CT dated 10/18/2017. FINDINGS: Brain: There is mild age-related atrophy and moderate chronic microvascular ischemic changes. 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 There is no acute intracranial hemorrhage. No mass effect or midline shift. No extra-axial fluid collection. Vascular: No hyperdense vessel or unexpected calcification. Skull: Normal. Negative for fracture or focal lesion. Sinuses/Orbits: Bilateral maxillary sinus retention cyst or polyps measuring up to 15 mm on the left. No air-fluid level. The mastoid air cells are clear. Other: None IMPRESSION: 1. No acute intracranial hemorrhage. 2. Age-related atrophy and chronic microvascular ischemic changes. 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 Electronically Signed   By: Anner Crete M.D.   On: 10/09/2019 21:12   DG CHEST PORT 1 VIEW  Result Date: 10/12/2019 CLINICAL DATA:  Absent breath sounds on left side of chest. EXAM: PORTABLE CHEST 1 VIEW COMPARISON:  Chest x-ray from same day. FINDINGS: Unchanged cardiomegaly. Normal pulmonary vascularity. Unchanged small right pleural effusion and mild bibasilar atelectasis. No pneumothorax. No acute osseous abnormality. IMPRESSION: 1. Unchanged small right pleural effusion and mild bibasilar atelectasis. Electronically Signed   By: Titus Dubin M.D.   On: 10/12/2019 14:58   ECHOCARDIOGRAM COMPLETE  Result Date: 10/09/2019   ECHOCARDIOGRAM REPORT   Patient Name:   Barbara Kelley Date of Exam: 10/09/2019 Medical Rec #:  [id number]      Height:       59.0 in Accession #:    [id number]     Weight:       122.1 lb Date of Birth:  17-Aug-1934      BSA:          1.50 m Patient Age:    75 years       BP:  140/89 mmHg  Patient Gender: F              HR:           90 bpm. Exam Location:  Inpatient Procedure: 2D Echo Indications:    Cardiomyopathy-Unspecified 425.9 / I42.9  History:        Patient has prior history of Echocardiogram examinations, most                 recent 05/16/2018. NICM and CHF, CAD, COPD; Risk                 Factors:Hypertension and Dyslipidemia.  Sonographer:    Vikki Ports Turrentine Referring Phys: [id number] [place]  1. Left ventricular ejection fraction, by visual estimation, is 10 %%. The left ventricle has severely decreased function. There is no left ventricular hypertrophy.  2. Left ventricular diastolic parameters are consistent with Grade II diastolic dysfunction (pseudonormalization).  3. Severely dilated left ventricular internal cavity size.  4. The left ventricle demonstrates global hypokinesis.  5. Global right ventricle has low normal systolic function.The right ventricular size is normal. No increase in right ventricular wall thickness.  6. Left atrial size was severely dilated.  7. Right atrial size was severely dilated.  8. The mitral valve is normal in structure. Mild to moderate mitral valve regurgitation.  9. The tricuspid valve is grossly normal. 10. The aortic valve is tricuspid. Aortic valve regurgitation is not visualized. No evidence of aortic valve sclerosis or stenosis. 11. The pulmonic valve was normal in structure. Pulmonic valve regurgitation is trivial. 12. Moderately elevated pulmonary artery systolic pressure. 13. The atrial septum is grossly normal. FINDINGS  Left Ventricle: Left ventricular ejection fraction, by visual estimation, is 10 %%. The left ventricle has severely decreased function. The left ventricle demonstrates global hypokinesis. The left ventricular internal cavity size was severely dilated left ventricle. There is no left ventricular hypertrophy. Left ventricular diastolic parameters are consistent with Grade II diastolic dysfunction  (pseudonormalization). Right Ventricle: The right ventricular size is normal. No increase in right ventricular wall thickness. Global RV systolic function is has low normal systolic function. The tricuspid regurgitant velocity is 2.84 m/s, and with an assumed right atrial pressure of 15 mmHg, the estimated right ventricular systolic pressure is moderately elevated at 47.2 mmHg. Left Atrium: Left atrial size was severely dilated. Right Atrium: Right atrial size was severely dilated Pericardium: There is no evidence of pericardial effusion. Mitral Valve: The mitral valve is normal in structure. Mild to moderate mitral valve regurgitation. Tricuspid Valve: The tricuspid valve is grossly normal. Tricuspid valve regurgitation is severe. Aortic Valve: The aortic valve is tricuspid. Aortic valve regurgitation is not visualized. The aortic valve is structurally normal, with no evidence of sclerosis or stenosis. Pulmonic Valve: The pulmonic valve was normal in structure. Pulmonic valve regurgitation is trivial. Pulmonic regurgitation is trivial. Aorta: The aortic root and ascending aorta are structurally normal, with no evidence of dilitation. IAS/Shunts: The atrial septum is grossly normal.  LEFT VENTRICLE PLAX 2D LVIDd:         5.90 cm       Diastology LVIDs:         5.40 cm       LV e' lateral:   5.00 cm/s LV PW:         1.00 cm       LV E/e' lateral: 13.0 LV IVS:        1.00 cm LVOT diam:  2.00 cm LV SV:         32 ml LV SV Index:   20.83 LVOT Area:     3.14 cm  LV Volumes (MOD) LV area d, A2C:    32.50 cm LV area d, A4C:    30.80 cm LV area s, A2C:    28.10 cm LV area s, A4C:    26.40 cm LV major d, A2C:   7.40 cm LV major d, A4C:   7.12 cm LV major s, A2C:   6.94 cm LV major s, A4C:   7.03 cm LV vol d, MOD A2C: 123.0 ml LV vol d, MOD A4C: 111.0 ml LV vol s, MOD A2C: 95.2 ml LV vol s, MOD A4C: 86.0 ml LV SV MOD A2C:     27.8 ml LV SV MOD A4C:     111.0 ml LV SV MOD BP:      28.3 ml RIGHT VENTRICLE RV S prime:      7.18 cm/s LEFT ATRIUM              Index       RIGHT ATRIUM           Index LA diam:        4.80 cm  3.21 cm/m  RA Area:     32.40 cm LA Vol (A2C):   105.0 ml 70.21 ml/m RA Volume:   121.00 ml 80.91 ml/m LA Vol (A4C):   74.2 ml  49.62 ml/m LA Biplane Vol: 90.0 ml  60.18 ml/m   AORTA Ao Root diam: 3.00 cm MITRAL VALVE                        TRICUSPID VALVE MV Area (PHT): 3.77 cm             TR Peak grad:   32.2 mmHg MV PHT:        58.29 msec           TR Vmax:        305.00 cm/s MV Decel Time: 201 msec MV E velocity: 65.10 cm/s 103 cm/s  SHUNTS MV A velocity: 72.40 cm/s 70.3 cm/s Systemic Diam: 2.00 cm MV E/A ratio:  0.90       1.5  Mertie Moores MD Electronically signed by Mertie Moores MD Signature Date/Time: 10/09/2019/3:14:30 PM    Final     Assessment/Plan  1. Chronic systolic heart failure (HCC) - no SOB nor edema , continue carvedilol, digoxin, spironolactone, Entresto and Lasix  2. S/P thoracentesis - had pleural effusion and had thoracentesis in the hospital, continue O2 @ 2L/min via [place]   Family/ staff Communication: Discussed plan of care with resident.  Labs/tests ordered: None  Goals of care:   Short-term care   Durenda Age, DNP, FNP-BC [facility] and [facility] Medicine [phone] (Monday-Friday 8:00 a.m. - 5:00 p.m.) [phone] (after hours)

## 2019-10-26 ENCOUNTER — Ambulatory Visit: Payer: Medicare PPO | Admitting: General Practice

## 2019-10-31 ENCOUNTER — Ambulatory Visit (INDEPENDENT_AMBULATORY_CARE_PROVIDER_SITE_OTHER): Payer: Medicare PPO | Admitting: Emergency Medicine

## 2019-10-31 ENCOUNTER — Other Ambulatory Visit: Payer: Self-pay

## 2019-10-31 ENCOUNTER — Encounter: Payer: Self-pay | Admitting: Emergency Medicine

## 2019-10-31 DIAGNOSIS — I5022 Chronic systolic (congestive) heart failure: Secondary | ICD-10-CM | POA: Diagnosis not present

## 2019-10-31 DIAGNOSIS — R0602 Shortness of breath: Secondary | ICD-10-CM | POA: Diagnosis not present

## 2019-10-31 NOTE — Progress Notes (Signed)
Virtual Visit via Video Note  I connected with Barbara Kelley on 10/31/19 at  1:45 PM EST by a video enabled telemedicine application and verified that I am speaking with the correct person using two identifiers.  Location: Patient: Rehab facility Provider: Office   I discussed the limitations of evaluation and management by telemedicine and the availability of in person appointments. The patient expressed understanding and agreed to proceed.  History of Present Illness: 86 year old woman, never smoker, with a history of cardiomyopathy admitted with a CHF exacerbation in January.  She required diuresis, BiPAP support for hypoxemia and hypercapnia.  She had a large right pleural effusion that was drained and proved to be a transudate.  She improved with volume removal.  She is now at rehab.  I seen her in the past for chronic cough.  We had discussed pulmonary function testing but this has never been done.    Observations/Objective: She is working with rehab, is improving her functional capacity. She is wearing O2 at some times. She has been sleeping with it. Wonders whether she still needs it. Apparently she has been doing her exercises without O2 without desaturations. She has lost 30 lbs of fluid, has kept it off. Her cough has improved.   Assessment and Plan: Based on her oximetry done at rehab with her exercise program it sounds like her hypoxemia has resolved and she should be able to be discharged home on room air. Plan to repeat her walking oximetry in office when she is discharged from rehab to home to confirm that she remains adequately saturated.  If she has progressive CHF, progressive hypoxemia then we will reorder her oxygen, discussed with cardiology.  Follow Up Instructions: 1 month with walking oximetry on RA    I discussed the assessment and treatment plan with the patient. The patient was provided an opportunity to ask questions and all were answered. The patient agreed with  the plan and demonstrated an understanding of the instructions.   The patient was advised to call back or seek an in-person evaluation if the symptoms worsen or if the condition fails to improve as anticipated.  I provided 15 minutes of non-face-to-face time during this encounter.   Collene Gobble, MD

## 2019-10-31 NOTE — Telephone Encounter (Signed)
Medications will be discussed at follow up visit. See other phone note.

## 2019-11-01 ENCOUNTER — Non-Acute Institutional Stay (SKILLED_NURSING_FACILITY): Payer: Medicare PPO | Admitting: Adult Health

## 2019-11-01 ENCOUNTER — Encounter: Payer: Self-pay | Admitting: Adult Health

## 2019-11-01 DIAGNOSIS — Z9889 Other specified postprocedural states: Secondary | ICD-10-CM

## 2019-11-01 DIAGNOSIS — I5022 Chronic systolic (congestive) heart failure: Secondary | ICD-10-CM

## 2019-11-01 DIAGNOSIS — J3089 Other allergic rhinitis: Secondary | ICD-10-CM

## 2019-11-01 MED ORDER — COQ10 200 MG PO CAPS: 1.0000 | ORAL_CAPSULE | Freq: Every day | ORAL | 0 refills | Status: DC

## 2019-11-01 MED ORDER — SACUBITRIL-VALSARTAN 49-51 MG PO TABS: 1.0000 | ORAL_TABLET | Freq: Two times a day (BID) | ORAL | 0 refills | Status: DC

## 2019-11-01 MED ORDER — DIGOXIN 62.5 MCG PO TABS: 0.0625 mg | ORAL_TABLET | Freq: Every day | ORAL | 0 refills | Status: DC

## 2019-11-01 MED ORDER — CARVEDILOL 6.25 MG PO TABS: 6.2500 mg | ORAL_TABLET | Freq: Two times a day (BID) | ORAL | 0 refills | Status: DC

## 2019-11-01 MED ORDER — SPIRONOLACTONE 25 MG PO TABS: 12.5000 mg | ORAL_TABLET | Freq: Every day | ORAL | 0 refills | Status: DC

## 2019-11-01 MED ORDER — GINKGO BILOBA 40 MG PO CAPS: ORAL_CAPSULE | ORAL | 0 refills | Status: DC

## 2019-11-01 MED ORDER — ZINC GLUCONATE 50 MG PO TABS: 50.0000 mg | ORAL_TABLET | Freq: Every day | ORAL | 0 refills | Status: DC

## 2019-11-01 MED ORDER — FUROSEMIDE 40 MG PO TABS: 40.0000 mg | ORAL_TABLET | Freq: Two times a day (BID) | ORAL | 0 refills | Status: DC

## 2019-11-01 MED ORDER — FLUTICASONE PROPIONATE 50 MCG/ACT NA SUSP: 1.0000 | Freq: Every day | NASAL | 0 refills | Status: DC

## 2019-11-01 NOTE — Progress Notes (Signed)
Location:  [name] Room Number: 209/A Place of Service:  SNF (31) Provider:  Durenda Age, DNP, FNP-BC  Patient Care Team: [name], MD as PCP - General (Family Medicine) [name] [name], MD as PCP - Cardiology (Cardiology) [name], MD as Consulting Physician (Cardiology)  Extended Emergency Contact Information Primary Emergency Contact: Barbara Kelley of [place] Phone: [phone] Mobile Phone: [phone] Relation: Daughter  Code Status:  Full Code  Goals of care: Advanced Directive information Advanced Directives 11/01/2019  Does Patient Have a Medical Advance Directive? Yes  Type of Advance Directive (No Data)  Does patient want to make changes to medical advance directive? No - Patient declined  Copy of [place] in Chart? -  Would patient like information on creating a medical advance directive? -     Chief Complaint  Patient presents with   Discharge Note    Discharge Visit    HPI:  Pt is a 86 y.o. female who is for discharge home with Home health PT and OT.  She has PMH of chronic systolic heart failure, hypertensive heart disease, nonrheumatic mitral valve regurgitation and mild CAD.  She was admitted to [facility] on 10/16/2019 post hospitalization 10/08/19 to 10/16/2019 for acute on chronic heart failure.  She was having worsening lower leg edema and orthopnea despite increasing Lasix.  In the ER, she had BNP > 4500.  Chest x-ray showed moderate right pleural effusion with adjacent opacities possible atelectasis or infiltrate.  EKG showed NSR, 90 bpm, PVC and nonspecific T wave changes inferior leads, extreme axis deviation. She was diuresed -8.9L, weight 127 >>95 lbs.  Lasix was reduced to 40 mg twice a day at discharge from the hospital.  Pulmonology was consulted and performed thoracentesis with improved symptoms.  She followed up with  pulmonology, 10/31/19. Pulse oximetry done was reviewed and showed that hypoxemia was resolved and she can discharge on room air.  Patient was admitted to this facility for short-term rehabilitation after the patient's recent hospitalization.  Patient has completed SNF rehabilitation and therapy has cleared the patient for discharge.   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 ; deficit of CVA in 2008   Pneumonia    x2 most recent fall of 2017    PONV (postoperative nausea and vomiting)    Stroke ([place]) 2008   deficit of neuroplathy in left hand and left foot    Past Surgical History:  Procedure Laterality Date   ABDOMINAL HYSTERECTOMY     APPENDECTOMY     BLADDER SUSPENSION     CARDIAC CATHETERIZATION  10/2015   CHOLECYSTECTOMY     HEMIARTHROPLASTY HIP Right 10/20/2017   HIP ARTHROPLASTY Right 10/19/2017   Procedure: ARTHROPLASTY BIPOLAR HIP (HEMIARTHROPLASTY);  Surgeon: Paralee Cancel, MD;  Location: [facility];  Service: Orthopedics;  Laterality: Right;   JOINT REPLACEMENT Left 2013   left hip   TONSILLECTOMY     TOTAL KNEE ARTHROPLASTY Left 05/04/2017   Procedure: LEFT TOTAL KNEE ARTHROPLASTY;  Surgeon: Paralee Cancel, MD;  Location: [facility];  Service: Orthopedics;  Laterality: Left;  70 mins   TUBAL LIGATION      Allergies  Allergen Reactions   Doxycycline  Nausea Only    Vertigo   Macrobid [Nitrofurantoin] Other (See Comments)    Nerve pain   Valsartan Other (See Comments)    Blood pressure rise   Lisinopril Cough   Calcium Channel Blockers Palpitations    Blow pressure rise / tachycardia    Penicillins Rash    Has patient had a PCN reaction causing immediate rash, facial/tongue/throat swelling, SOB or lightheadedness with hypotension: Yes Has  patient had a PCN reaction causing severe rash involving mucus membranes or skin necrosis: Yes Has patient had a PCN reaction that required hospitalization: Unk Has patient had a PCN reaction occurring within the last 10 years: No If all of the above answers are "NO", then may proceed with Cephalosporin use.     Outpatient Encounter Medications as of 11/01/2019  Medication Sig   b complex vitamins tablet Take 1 tablet by mouth daily.   bisacodyl (DULCOLAX) 10 MG suppository Place 10 mg rectally daily as needed for moderate constipation (If not relieved by MOM).   calcium carbonate (CALCIUM 600) 600 MG TABS tablet Take 600 mg by mouth at bedtime.    carvedilol (COREG) 6.25 MG tablet Take 1 tablet (6.25 mg total) by mouth 2 (two) times daily with a meal.   Coenzyme Q10 (COQ10) 200 MG CAPS Take 1 capsule by mouth daily.   digoxin 62.5 MCG TABS Take 0.0625 mg by mouth daily.   fluticasone (FLONASE) 50 MCG/ACT nasal spray Place 1 spray into both nostrils daily. AS NEEDED FOR ALLERGIC RHINITIS   furosemide (LASIX) 40 MG tablet Take 1 tablet (40 mg total) by mouth 2 (two) times daily.   Ginkgo Biloba 40 MG CAPS Take by mouth. TAKE 3 CAPSULES (120MG ) BY MOUTH ONCE DAILY FOR SUPPLEMENT (DO NOT CRUSH)   Multiple Vitamins-Minerals (MULTIVITAMIN WITH MINERALS) tablet Take 1 tablet by mouth daily.   NON FORMULARY ADD MEDPASS 120ML TWO TIMES A DAY   sacubitril-valsartan (ENTRESTO) 49-51 MG Take 1 tablet by mouth 2 (two) times daily.   Sodium Phosphates (RA SALINE ENEMA RE) Place 1 each rectally daily as needed.   spironolactone (ALDACTONE) 25 MG tablet Take 0.5 tablets (12.5 mg total) by mouth daily.   Vitamin D3 (VITAMIN D) 25 MCG tablet Take 1,000 Units by mouth daily. For supplement   zinc gluconate 50 MG tablet Take 50 mg by mouth daily.   [DISCONTINUED] Cholecalciferol (VITAMIN D HIGH POTENCY) 25 MCG (1000 UT) capsule Take 1,000 Units by mouth daily.   [DISCONTINUED] GINKGO BILOBA  PO Take 3 capsules by mouth daily.    No facility-administered encounter medications on file as of 11/01/2019.    Review of Systems  GENERAL: No change in appetite, no fatigue, no weight changes, no fever, chills or weakness MOUTH and THROAT: Denies oral discomfort, gingival pain or bleeding, pain from teeth or hoarseness   RESPIRATORY: no cough, SOB, DOE, wheezing, hemoptysis CARDIAC: No chest pain, edema or palpitations GI: No abdominal pain, diarrhea, constipation, heart burn, nausea or vomiting GU: Denies dysuria, frequency, hematuria, incontinence, or discharge NEUROLOGICAL: Denies dizziness, syncope, numbness, or headache PSYCHIATRIC: Denies feelings of depression or anxiety. No report of hallucinations, insomnia, paranoia, or agitation    Immunization History  Administered Date(s) Administered   Influenza, High Dose Seasonal PF 07/06/2018   Influenza,inj,quad, With Preservative 07/05/2017   Influenza-Unspecified 06/06/2019   Pertinent  Health Maintenance Due  Topic Date Due   DEXA SCAN  07/13/2001   PNA vac Low Risk Adult (1 of 2 - PCV13) 07/13/2001  INFLUENZA VACCINE  Completed   No flowsheet data found.   Vitals:   11/01/19 0944  BP: 133/74  Pulse: (!) 56  Resp: 18  Temp: 97.9 F (36.6 C)  TempSrc: Oral  SpO2: 95%  Weight: 102 lb (46.3 kg)  Height: 4\' 11"  (1.499 m)   Body mass index is 20.6 kg/m.  Physical Exam  GENERAL APPEARANCE: Well nourished. In no acute distress. Normal body habitus SKIN:  Skin is warm and dry.  MOUTH and THROAT: Lips are without lesions. Oral mucosa is moist and without lesions. Tongue is normal in shape, size, and color and without lesions RESPIRATORY: Breathing is even & unlabored, BS CTAB CARDIAC: RRR, no murmur,no extra heart sounds, no edema GI: Abdomen soft, normal BS, no masses, no tenderness EXTREMITIES:  Able to move X 4 extremities NEUROLOGICAL: There is no tremor. Speech is clear. PSYCHIATRIC:  Affect and  behavior are appropriate  Labs reviewed: Recent Labs    10/09/19 1246 10/09/19 1952 10/14/19 0930 10/15/19 1010 10/16/19 0441  NA  --    < > 142 141 142  K  --    < > 3.5 4.3 3.7  CL  --    < > 84* 83* 81*  CO2  --    < > 45* >50* >50*  GLUCOSE  --    < > 87 95 98  BUN  --    < > 25* 19 24*  CREATININE 0.82   < > 0.91 0.77 0.88  CALCIUM  --    < > 9.1 9.4 9.3  MG 2.2  --   --   --   --    < > = values in this interval not displayed.   Recent Labs    10/09/19 1952 10/12/19 1427  AST 65* 23  ALT 16 17  ALKPHOS 65 55  BILITOT 1.6* 1.2  PROT 6.1* 6.0*  ALBUMIN 3.5 3.3*   Recent Labs    10/08/19 1446 10/08/19 1446 10/09/19 1246 10/09/19 1952 10/12/19 1524  WBC 8.3  --  7.6 9.0  --   HGB 15.3*   < > 14.1 14.6 16.0*  HCT 47.6*   < > 44.1 46.9* 47.0*  MCV 91.9  --  90.0 91.4  --   PLT 149*  --  166 169  --    < > = values in this interval not displayed.   Lab Results  Component Value Date   TSH 1.725 10/09/2019     Significant Diagnostic Results in last 30 days:  DG Chest 1 View  Result Date: 10/12/2019 CLINICAL DATA:  Post thoracentesis EXAM: CHEST  1 VIEW COMPARISON:  10/11/2019 FINDINGS: Significant improvement in right pleural effusion which is now small. No pneumothorax. Improved aeration right lung base. Cardiac enlargement. Negative for edema. Mild left lower lobe atelectasis. IMPRESSION: No complication post right thoracentesis. Small residual pleural effusion. Improved aeration right lung base. Electronically Signed   By: Franchot Gallo M.D.   On: 10/12/2019 13:41   DG Chest 2 View  Result Date: 10/11/2019 CLINICAL DATA:  Weakness, shortness of breath EXAM: CHEST - 2 VIEW COMPARISON:  10/08/2019 FINDINGS: Large right pleural effusion with right lower lobe atelectasis or infiltrate. Cardiomegaly. No confluent opacity or effusion on the left. No acute bony abnormality. IMPRESSION: Large right pleural effusion with right lower lobe atelectasis or infiltrate,  slightly increasing since prior study. Cardiomegaly. Electronically Signed   By: Rolm Baptise M.D.   On: 10/11/2019 10:54   DG  Chest 2 View  Result Date: 10/08/2019 CLINICAL DATA:  C/o generalized weakness since yesterday and SOB. Pt also has CHF EXAM: CHEST - 2 VIEW COMPARISON:  Chest radiograph 12/15/2018 FINDINGS: Stable cardiomediastinal contours with enlarged heart size. There is a moderate/large right pleural effusion with heterogeneous opacities in the adjacent right lung, possibly atelectasis though infiltrate not excluded. The left lung is clear. No pneumothorax. No acute finding in the visualized skeleton. IMPRESSION: Moderate/large right pleural effusion with adjacent opacities, possibly atelectasis or infiltrate. Electronically Signed   By: Audie Pinto M.D.   On: 10/08/2019 15:08   CT HEAD WO CONTRAST  Result Date: 10/09/2019 CLINICAL DATA:  86 year old female with altered mental status. EXAM: CT HEAD WITHOUT CONTRAST TECHNIQUE: Contiguous axial images were obtained from the base of the skull through the vertex without intravenous contrast. COMPARISON:  Head CT dated 10/18/2017. FINDINGS: Brain: There is mild age-related atrophy and moderate chronic microvascular ischemic changes. 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 There is no acute intracranial hemorrhage. No mass effect or midline shift. No extra-axial fluid collection. Vascular: No hyperdense vessel or unexpected calcification. Skull: Normal. Negative for fracture or focal lesion. Sinuses/Orbits: Bilateral maxillary sinus retention cyst or polyps measuring up to 15 mm on the left. No air-fluid level. The mastoid air cells are clear. Other: None IMPRESSION: 1. No acute intracranial hemorrhage. 2. Age-related atrophy and chronic microvascular ischemic changes. 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 Electronically Signed   By: Anner Crete M.D.   On: 10/09/2019 21:12    DG CHEST PORT 1 VIEW  Result Date: 10/12/2019 CLINICAL DATA:  Absent breath sounds on left side of chest. EXAM: PORTABLE CHEST 1 VIEW COMPARISON:  Chest x-ray from same day. FINDINGS: Unchanged cardiomegaly. Normal pulmonary vascularity. Unchanged small right pleural effusion and mild bibasilar atelectasis. No pneumothorax. No acute osseous abnormality. IMPRESSION: 1. Unchanged small right pleural effusion and mild bibasilar atelectasis. Electronically Signed   By: Titus Dubin M.D.   On: 10/12/2019 14:58   ECHOCARDIOGRAM COMPLETE  Result Date: 10/09/2019   ECHOCARDIOGRAM REPORT   Patient Name:   Barbara Kelley Date of Exam: 10/09/2019 Medical Rec #:  [id number]      Height:       59.0 in Accession #:    [id number]     Weight:       122.1 lb Date of Birth:  1934-05-19      BSA:          1.50 m Patient Age:    27 years       BP:           140/89 mmHg Patient Gender: F              HR:           90 bpm. Exam Location:  Inpatient Procedure: 2D Echo Indications:    Cardiomyopathy-Unspecified 425.9 / I42.9  History:        Patient has prior history of Echocardiogram examinations, most                 recent 05/16/2018. NICM and CHF, CAD, COPD; Risk                 Factors:Hypertension and Dyslipidemia.  Sonographer:    Vikki Ports Turrentine Referring Phys: [id number] [place]  1. Left ventricular ejection fraction, by visual estimation, is 10 %%. The left ventricle has severely decreased function. There is no left  ventricular hypertrophy.  2. Left ventricular diastolic parameters are consistent with Grade II diastolic dysfunction (pseudonormalization).  3. Severely dilated left ventricular internal cavity size.  4. The left ventricle demonstrates global hypokinesis.  5. Global right ventricle has low normal systolic function.The right ventricular size is normal. No increase in right ventricular wall thickness.  6. Left atrial size was severely dilated.  7. Right atrial size was severely dilated.  8.  The mitral valve is normal in structure. Mild to moderate mitral valve regurgitation.  9. The tricuspid valve is grossly normal. 10. The aortic valve is tricuspid. Aortic valve regurgitation is not visualized. No evidence of aortic valve sclerosis or stenosis. 11. The pulmonic valve was normal in structure. Pulmonic valve regurgitation is trivial. 12. Moderately elevated pulmonary artery systolic pressure. 13. The atrial septum is grossly normal. FINDINGS  Left Ventricle: Left ventricular ejection fraction, by visual estimation, is 10 %%. The left ventricle has severely decreased function. The left ventricle demonstrates global hypokinesis. The left ventricular internal cavity size was severely dilated left ventricle. There is no left ventricular hypertrophy. Left ventricular diastolic parameters are consistent with Grade II diastolic dysfunction (pseudonormalization). Right Ventricle: The right ventricular size is normal. No increase in right ventricular wall thickness. Global RV systolic function is has low normal systolic function. The tricuspid regurgitant velocity is 2.84 m/s, and with an assumed right atrial pressure of 15 mmHg, the estimated right ventricular systolic pressure is moderately elevated at 47.2 mmHg. Left Atrium: Left atrial size was severely dilated. Right Atrium: Right atrial size was severely dilated Pericardium: There is no evidence of pericardial effusion. Mitral Valve: The mitral valve is normal in structure. Mild to moderate mitral valve regurgitation. Tricuspid Valve: The tricuspid valve is grossly normal. Tricuspid valve regurgitation is severe. Aortic Valve: The aortic valve is tricuspid. Aortic valve regurgitation is not visualized. The aortic valve is structurally normal, with no evidence of sclerosis or stenosis. Pulmonic Valve: The pulmonic valve was normal in structure. Pulmonic valve regurgitation is trivial. Pulmonic regurgitation is trivial. Aorta: The aortic root and ascending  aorta are structurally normal, with no evidence of dilitation. IAS/Shunts: The atrial septum is grossly normal.  LEFT VENTRICLE PLAX 2D LVIDd:         5.90 cm       Diastology LVIDs:         5.40 cm       LV e' lateral:   5.00 cm/s LV PW:         1.00 cm       LV E/e' lateral: 13.0 LV IVS:        1.00 cm LVOT diam:     2.00 cm LV SV:         32 ml LV SV Index:   20.83 LVOT Area:     3.14 cm  LV Volumes (MOD) LV area d, A2C:    32.50 cm LV area d, A4C:    30.80 cm LV area s, A2C:    28.10 cm LV area s, A4C:    26.40 cm LV major d, A2C:   7.40 cm LV major d, A4C:   7.12 cm LV major s, A2C:   6.94 cm LV major s, A4C:   7.03 cm LV vol d, MOD A2C: 123.0 ml LV vol d, MOD A4C: 111.0 ml LV vol s, MOD A2C: 95.2 ml LV vol s, MOD A4C: 86.0 ml LV SV MOD A2C:     27.8 ml LV SV MOD A4C:  111.0 ml LV SV MOD BP:      28.3 ml RIGHT VENTRICLE RV S prime:     7.18 cm/s LEFT ATRIUM              Index       RIGHT ATRIUM           Index LA diam:        4.80 cm  3.21 cm/m  RA Area:     32.40 cm LA Vol (A2C):   105.0 ml 70.21 ml/m RA Volume:   121.00 ml 80.91 ml/m LA Vol (A4C):   74.2 ml  49.62 ml/m LA Biplane Vol: 90.0 ml  60.18 ml/m   AORTA Ao Root diam: 3.00 cm MITRAL VALVE                        TRICUSPID VALVE MV Area (PHT): 3.77 cm             TR Peak grad:   32.2 mmHg MV PHT:        58.29 msec           TR Vmax:        305.00 cm/s MV Decel Time: 201 msec MV E velocity: 65.10 cm/s 103 cm/s  SHUNTS MV A velocity: 72.40 cm/s 70.3 cm/s Systemic Diam: 2.00 cm MV E/A ratio:  0.90       1.5  Mertie Moores MD Electronically signed by Mertie Moores MD Signature Date/Time: 10/09/2019/3:14:30 PM    Final     Assessment/Plan  1. Chronic systolic heart failure (HCC) - follow-up with cardiology - carvedilol (COREG) 6.25 MG tablet; Take 1 tablet (6.25 mg total) by mouth 2 (two) times daily with a meal.  Dispense: 60 tablet; Refill: 0 - Digoxin 62.5 MCG TABS; Take 0.0625 mg by mouth daily.  Dispense: 30 tablet; Refill: 0 -  furosemide (LASIX) 40 MG tablet; Take 1 tablet (40 mg total) by mouth 2 (two) times daily.  Dispense: 60 tablet; Refill: 0 - sacubitril-valsartan (ENTRESTO) 49-51 MG; Take 1 tablet by mouth 2 (two) times daily.  Dispense: 60 tablet; Refill: 0 - spironolactone (ALDACTONE) 25 MG tablet; Take 0.5 tablets (12.5 mg total) by mouth daily.  Dispense: 15 tablet; Refill: 0  2. Seasonal allergic rhinitis due to other allergic trigger - fluticasone (FLONASE) 50 MCG/ACT nasal spray; Place 1 spray into both nostrils daily. AS NEEDED FOR ALLERGIC RHINITIS  Dispense: 9.9 mL; Refill: 0  3. S/P thoracentesis - followed up with pulmonology on 10/31/19, hypoxemia has resolved, will discharge on room air, follow up with pulmonology in 1 month with walking oximetry on room air     I have filled out patient's discharge paperwork and e-prescribed medications.  Patient will have home health PT and OT.  DME provided: None  Total discharge time: Greater than 30 minutes Greater than 50% was spent in counseling and coordination of care.   Discharge time involved coordination of the discharge process with social worker, nursing staff and therapy department. Medical justification for home health services verified.   Durenda Age, DNP, FNP-BC [facility] [phone] (Monday-Friday 8:00 a.m. - 5:00 p.m.) [phone] (after hours)

## 2019-11-03 ENCOUNTER — Ambulatory Visit: Payer: Medicare PPO | Admitting: General Practice

## 2019-11-17 NOTE — Progress Notes (Signed)
Cardiology Clinic Note   Patient Name: Barbara Kelley Date of Encounter: 11/21/2019  Primary Care Provider:  [name], MD Primary Cardiologist:  Kirk Ruths, MD  Patient Profile    Barbara Kelley 86 year old female presents today for follow-up of her NICM, chronic systolic heart failure, mitral regurgitation, and shortness of breath.  Past Medical History    Past Medical History:  Diagnosis Date  . Arthritis   . Cardiomyopathy ([place])   . Congestive heart failure (CHF) ([place])   . Dyspnea   . GERD (gastroesophageal reflux disease)    currently non problematic   . Hypertension   . 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 Pneumonia    x2 most recent fall of 2017   . PONV (postoperative nausea and vomiting)   . Stroke [facility]) 2008   deficit of neuroplathy in left hand and left foot    Past Surgical History:  Procedure Laterality Date  . ABDOMINAL HYSTERECTOMY    . APPENDECTOMY    . BLADDER SUSPENSION    . CARDIAC CATHETERIZATION  10/2015  . CHOLECYSTECTOMY    . HEMIARTHROPLASTY HIP Right 10/20/2017  . HIP ARTHROPLASTY Right 10/19/2017   Procedure: ARTHROPLASTY BIPOLAR HIP (HEMIARTHROPLASTY);  Surgeon: Paralee Cancel, MD;  Location: [facility];  Service: Orthopedics;  Laterality: Right;  . JOINT REPLACEMENT Left 2013   left hip  . TONSILLECTOMY    . TOTAL KNEE ARTHROPLASTY Left 05/04/2017   Procedure: LEFT TOTAL KNEE ARTHROPLASTY;  Surgeon: Paralee Cancel, MD;  Location: [facility];  Service: Orthopedics;  Laterality: Left;  70 mins  . TUBAL LIGATION      Allergies  Allergies  Allergen Reactions  . Doxycycline Nausea Only    Vertigo  . Macrobid [Nitrofurantoin] Other (See Comments)    Nerve pain  . Valsartan Other (See Comments)    Blood pressure rise  . Lisinopril Cough  . Calcium Channel Blockers Palpitations    Blow pressure rise / tachycardia   . Penicillins Rash    Has patient had a PCN reaction causing immediate rash,  facial/tongue/throat swelling, SOB or lightheadedness with hypotension: Yes Has patient had a PCN reaction causing severe rash involving mucus membranes or skin necrosis: Yes Has patient had a PCN reaction that required hospitalization: Unk Has patient had a PCN reaction occurring within the last 10 years: No If all of the above answers are "NO", then may proceed with Cephalosporin use.     History of Present Illness    Ms. Barbara Kelley has a past medical history of chronic systolic heart failure, hypertensive heart disease/NICM, mitral valve regurgitation, and mild CAD.  She was recently admitted to to the hospital on 10/08/2019-10/16/2019 for CHF exacerbation.  She is followed by Dr. Bettina Gavia.  She is undergone cardiac catheterization which is not shown in epic.  Her cardiac catheterization revealed mild coronary artery disease 30% left main, and a EF of 40%.  Her echocardiogram from 10/2017 showed an EF of 0000000, grade 2 diastolic dysfunction, trivial TR, mild MR, and moderate dilation of the LA/RA.  Follow-up 2D echocardiogram showed EF of 50%, dilated LV, moderate LVH, severe LAE, moderate RAE, moderate MR, and mild TR.  She was seen by Dr. Bettina Gavia 04/05/2019 for decompensated heart failure.  Over the previous couple of months she has noticed increasing lower extremity edema and orthopnea despite increasing Lasix.  She then presented to the ER on 1/4 with worsening symptoms of CHF.  Her BNP was  greater than 4500 at the time.  Her chest x-ray showed moderate right pleural effusion with adjacent opacities possible atelectasis or infiltrates.  Her EKG at that time showed normal sinus rhythm 90 bpm PVC and nonspecific T wave changes inferior leads with extreme axis deviation.  She was diuresed 8.9 L and her weight went from 127 pounds to 95 pounds.  Her digoxin was reduced to 0.0658 mg daily, her Coreg Entresto and spironolactone were continued.  With a plan to titrate medications up as outpatient.  At the day  of discharge (10/16/2019) her Lasix was reduced to 40 mg twice daily due to hypotension.  She presents the clinic today for follow-up and states she feels much better.  She has been doing group exercise 5 days/week for about 45 minutes at a time.  Her exercises include balance walking and strength exercises.  She states that she eats meals at her facility and is given several options.  I have instructed her to stay away from salty type foods.  She is weighing daily at the same time after voiding.  When asked about her Delene Loll she states that she is still taking the lower dose 24-26 mg.  I will increase her Entresto today to 51-51.  I will order a BMP and have her follow-up in 2 weeks.  She denies chest pain, shortness of breath, lower extremity edema, fatigue, palpitations, melena, hematuria, hemoptysis, diaphoresis, weakness, presyncope, syncope, orthopnea, and PND.   Home Medications    Prior to Admission medications   Medication Sig Start Date End Date Taking? Authorizing Provider  b complex vitamins tablet Take 1 tablet by mouth daily.    [provider]  bisacodyl (DULCOLAX) 10 MG suppository Place 10 mg rectally daily as needed for moderate constipation (If not relieved by MOM).    [provider]  calcium carbonate (CALCIUM 600) 600 MG TABS tablet Take 600 mg by mouth at bedtime.     [provider]  carvedilol (COREG) 6.25 MG tablet Take 1 tablet (6.25 mg total) by mouth 2 (two) times daily with a meal. 11/01/19   Medina-Vargas, Monina C, NP  Coenzyme Q10 (COQ10) 200 MG CAPS Take 1 capsule by mouth daily. 11/01/19   Medina-Vargas, Monina C, NP  Digoxin 62.5 MCG TABS Take 0.0625 mg by mouth daily. 11/01/19   Medina-Vargas, Monina C, NP  fluticasone (FLONASE) 50 MCG/ACT nasal spray Place 1 spray into both nostrils daily. AS NEEDED FOR ALLERGIC RHINITIS 11/01/19   Medina-Vargas, Monina C, NP  furosemide (LASIX) 40 MG tablet Take 1 tablet (40 mg total) by mouth 2 (two)  times daily. 11/01/19   Medina-Vargas, Monina C, NP  Ginkgo Biloba 40 MG CAPS TAKE 3 CAPSULES (120MG ) BY MOUTH ONCE DAILY FOR SUPPLEMENT (DO NOT CRUSH) 11/01/19   Medina-Vargas, Monina C, NP  Multiple Vitamins-Minerals (MULTIVITAMIN WITH MINERALS) tablet Take 1 tablet by mouth daily.    [provider]  Baruch Gouty ADD MEDPASS 120ML TWO TIMES A DAY    [provider]  sacubitril-valsartan (ENTRESTO) 49-51 MG Take 1 tablet by mouth 2 (two) times daily. 11/01/19   Medina-Vargas, Monina C, NP  Sodium Phosphates (RA SALINE ENEMA RE) Place 1 each rectally daily as needed.    [provider]  spironolactone (ALDACTONE) 25 MG tablet Take 0.5 tablets (12.5 mg total) by mouth daily. 11/01/19   Medina-Vargas, Monina C, NP  Vitamin D3 (VITAMIN D) 25 MCG tablet Take 1,000 Units by mouth daily. For supplement    [provider]  zinc gluconate 50 MG tablet Take 1 tablet (50 mg total) by mouth daily. 11/01/19   Medina-Vargas, Senaida Lange, NP    Family History    Family History  Problem Relation Age of Onset  . Diabetes Mother   . Ovarian cancer Mother   . Lung cancer Father   . Diabetes Sister   . Stroke Maternal Grandfather    She indicated that the status of her mother is unknown. She indicated that the status of her father is unknown. She indicated that the status of her sister is unknown. She indicated that the status of her maternal grandfather is unknown.  Social History    Social History   Socioeconomic History  . Marital status: Widowed    Spouse name: Not on file  . Number of children: Not on file  . Years of education: Not on file  . Highest education level: Not on file  Occupational History  . Not on file  Tobacco Use  . Smoking status: Never Smoker  . Smokeless tobacco: Never Used  Substance and Sexual Activity  . Alcohol use: No  . Drug use: No  . Sexual activity: Not on file  Other Topics Concern  . Not on file  Social History Narrative  .  Not on file   Social Determinants of Health   Financial Resource Strain:   . Difficulty of Paying Living Expenses: Not on file  Food Insecurity:   . Worried About Charity fundraiser in the Last Year: Not on file  . Ran Out of Food in the Last Year: Not on file  Transportation Needs:   . Lack of Transportation (Medical): Not on file  . Lack of Transportation (Non-Medical): Not on file  Physical Activity:   . Days of Exercise per Week: Not on file  . Minutes of Exercise per Session: Not on file  Stress:   . Feeling of Stress : Not on file  Social Connections:   . Frequency of Communication with Friends and Family: Not on file  . Frequency of Social Gatherings with Friends and Family: Not on file  . Attends Religious Services: Not on file  . Active Member of Clubs or Organizations: Not on file  . Attends Archivist Meetings: Not on file  . Marital Status: Not on file  Intimate Partner Violence:   . Fear of Current or Ex-Partner: Not on file  . Emotionally Abused: Not on file  . Physically Abused: Not on file  . Sexually Abused: Not on file     Review of Systems    General:  No chills, fever, night sweats or weight changes.  Cardiovascular:  No chest pain, dyspnea on exertion, edema, orthopnea, palpitations, paroxysmal nocturnal dyspnea. Dermatological: No rash, lesions/masses Respiratory: No cough, dyspnea Urologic: No hematuria, dysuria Abdominal:   No nausea, vomiting, diarrhea, bright red blood per rectum, melena, or hematemesis Neurologic:  No visual changes, wkns, changes in mental status. All other systems reviewed and are otherwise negative except as noted above.  Physical Exam    VS:  BP 125/68   Pulse 61   Temp (!) 97 F (36.1 C)   Ht 4\' 11"  (1.499 m)   Wt 101 lb (45.8 kg)   SpO2 95%   BMI 20.40 kg/m  , BMI Body mass index is 20.4 kg/m. GEN: Well nourished, well developed, in no acute distress. HEENT: normal. Neck: Supple, no JVD, carotid  bruits, or masses. Cardiac: RRR, no murmurs, rubs, or  gallops. No clubbing, cyanosis, edema.  Radials/DP/PT 2+ and equal bilaterally.  Respiratory:  Respirations regular and unlabored, clear to auscultation bilaterally. GI: Soft, nontender, nondistended, BS + x 4. MS: no deformity or atrophy. Skin: warm and dry, no rash. Neuro:  Strength and sensation are intact. Psych: Normal affect.  Accessory Clinical Findings    ECG personally reviewed by me today-none today.  EKG 10/10/2019 Sinus rhythm with occasional PVCs right axis deviation anterior infarct undetermined age 31 bpm  Echocardiogram 10/09/2019 IMPRESSIONS    1. Left ventricular ejection fraction, by visual estimation, is 10 %%.  The left ventricle has severely decreased function. There is no left  ventricular hypertrophy.  2. Left ventricular diastolic parameters are consistent with Grade II  diastolic dysfunction (pseudonormalization).  3. Severely dilated left ventricular internal cavity size.  4. The left ventricle demonstrates global hypokinesis.  5. Global right ventricle has low normal systolic function.The right  ventricular size is normal. No increase in right ventricular wall  thickness.  6. Left atrial size was severely dilated.  7. Right atrial size was severely dilated.  8. The mitral valve is normal in structure. Mild to moderate mitral valve  regurgitation.  9. The tricuspid valve is grossly normal.  10. The aortic valve is tricuspid. Aortic valve regurgitation is not  visualized. No evidence of aortic valve sclerosis or stenosis.  11. The pulmonic valve was normal in structure. Pulmonic valve  regurgitation is trivial.  12. Moderately elevated pulmonary artery systolic pressure.  13. The atrial septum is grossly normal.  Assessment & Plan   1.  Acute on chronic systolic heart failure/nonischemic cardiomyopathy-no increased work of breathing today.  Echocardiogram 10/09/2019 showed LVEF of 10%, grade  2 diastolic dysfunction, severely dilated LV, severe bilateral enlargement, mild to moderate MR and moderate pulmonary hypertension.  BNP during mission greater than 4500.  Diuresed 8.9 L.  Weight at discharge 95 pounds and weight today 101 pounds.  She states she has 98 pounds at home and has maintained at that weight. Continue digoxin 0.0625 mg daily Continue carvedilol 6.25 mg twice daily Start Entresto 49-51 twice daily Continue spironolactone 12.5 mg daily Continue furosemide 40 mg twice daily Daily weights Order BMP Repeat echocardiogram when medications have been optimized  Essential hypertension-BP today 125/68.  Well-controlled at home. Continue digoxin 0.0625 mg daily Continue carvedilol Continue Entresto Continue spironolactone Heart healthy low-sodium diet no salty 6 given Blood pressure log given  Right pleural effusion status post thoracentesis-post thoracentesis she was weaned off oxygen.  Denies PND/orthopnea. Followed by pulmonary  Disposition: Follow-up with me in 2 weeks.  Jossie Ng. [facility] [address] Office [phone] Fax [phone]

## 2019-11-20 ENCOUNTER — Telehealth: Payer: Self-pay | Admitting: Cardiology

## 2019-11-20 ENCOUNTER — Ambulatory Visit: Payer: Medicare PPO | Admitting: General Practice

## 2019-11-20 NOTE — Telephone Encounter (Signed)
JC will call on phone during appt-left detailed message

## 2019-11-20 NOTE — Telephone Encounter (Signed)
Patient's daughter calling to get permission to accompany patient to appointment tomorrow with Coletta Memos. She states beings that her mother is in her 85's she usually comes with her to her appointments to help her.

## 2019-11-21 ENCOUNTER — Other Ambulatory Visit: Payer: Self-pay

## 2019-11-21 ENCOUNTER — Ambulatory Visit: Payer: Medicare PPO | Admitting: General Practice

## 2019-11-21 ENCOUNTER — Encounter: Payer: Self-pay | Admitting: General Practice

## 2019-11-21 VITALS — BP 125/68 | HR 61 | Temp 97.0°F | Ht 59.0 in | Wt 101.0 lb

## 2019-11-21 DIAGNOSIS — I5023 Acute on chronic systolic (congestive) heart failure: Secondary | ICD-10-CM | POA: Diagnosis not present

## 2019-11-21 DIAGNOSIS — J90 Pleural effusion, not elsewhere classified: Secondary | ICD-10-CM | POA: Diagnosis not present

## 2019-11-21 DIAGNOSIS — Z79899 Other long term (current) drug therapy: Secondary | ICD-10-CM

## 2019-11-21 DIAGNOSIS — I10 Essential (primary) hypertension: Secondary | ICD-10-CM | POA: Diagnosis not present

## 2019-11-21 DIAGNOSIS — I428 Other cardiomyopathies: Secondary | ICD-10-CM

## 2019-11-21 NOTE — Patient Instructions (Signed)
Medication Instructions:  INCREASE ENTRESTO 49/51MG  DAILY If you need a refill on your cardiac medications before your next appointment, please call your pharmacy.  Labwork: BMET HERE IN OUR OFFICE AT [facility]   You will NOT need to fast   If you have labs (blood work) drawn today and your tests are completely normal, you will receive your results only by: Marland Kitchen MyChart Message (if you have MyChart) OR A paper copy in the mail If you have any lab test that is abnormal or we need to change your treatment, we will call you to review these results. You may go to any [facility] lab that is convenient for you however, we do have a lab in our office that is able to assist you. You do NOT need an appointment for our lab. Once in our office in our office lobby there is a podium where you sign-in and ring the doorbell to alert [place] that you are here. Lab is open 8:00am and closes at 4:00pm; closes for lunch from 12:45 - 1:45pm. PLEASE BRING A COPY OF YOUR INSURANCE CARD WITH YOU.  Special Instructions: Daily Weight Record: It is important to weigh yourself daily. Keep your daily weight chart near your scale. Weigh yourself each morning at the same time. Weigh yourself without shoes, and try to wear the same amount of clothing each day. Compare today's weight to yesterday's weight.  Bring this form with you to your follow-up appointments.  PLEASE READ AND FOLLOW SALTY 6 ATTACHED  Reduce your risk of getting COVID-19 With your heart disease it is especially important for people at increased risk of severe illness from COVID-19, and those who live with them, to protect themselves from getting COVID-19. The best way to protect yourself and to help reduce the spread of the virus that causes COVID-19 is to: Marland Kitchen Limit your interactions with other people as much as possible. . Take precautions to prevent getting COVID-19 when you do interact with others. If you start feeling sick and think you may have COVID-19, get in  touch with your healthcare provider within 24 hours.  Follow-Up: 2 WEEKS  In Person Coletta Memos, [place].    At [facility], you and your health needs are our priority.  As part of our continuing mission to provide you with exceptional heart care, we have created designated Provider Care Teams.  These Care Teams include your primary Cardiologist (physician) and Advanced Practice Providers (APPs -  Physician Assistants and Nurse Practitioners) who all work together to provide you with the care you need, when you need it.  Thank you for choosing [facility] at [facility]!!

## 2019-11-25 ENCOUNTER — Other Ambulatory Visit: Payer: Self-pay | Admitting: Cardiology

## 2019-12-01 ENCOUNTER — Telehealth: Payer: Self-pay | Admitting: General Practice

## 2019-12-01 DIAGNOSIS — I5022 Chronic systolic (congestive) heart failure: Secondary | ICD-10-CM

## 2019-12-01 MED ORDER — SPIRONOLACTONE 25 MG PO TABS: 12.5000 mg | ORAL_TABLET | Freq: Every day | ORAL | 3 refills | Status: DC

## 2019-12-01 NOTE — Telephone Encounter (Signed)
I spoke with [name] and told her patient should continue this and I would send refill to [facility]. Prescription would not go through electronically. I called [facility] and the pharmacist confirmed they have spironolactone 12.5 mg daily on patient's profile and will get medication ready for pick up.

## 2019-12-01 NOTE — Telephone Encounter (Signed)
New Message     Pt c/o medication issue:  1. Name of Medication: spironolactone (ALDACTONE) 25 MG tablet  2. How are you currently taking this medication (dosage and times per day)? 12.5 mg 1 x daily   3. Are you having a reaction (difficulty breathing--STAT)? No   4. What is your medication issue? Pts daughter is calling and is wondering if the pt needs to continue taking this medication or if she needs to get a refill?  She says she will be out Sunday  If she needs a refill, she uses  [place], [place]   Please call

## 2019-12-02 LAB — BASIC METABOLIC PANEL
BUN/Creatinine Ratio: 28 (ref 12–28)
BUN: 23 mg/dL (ref 8–27)
CO2: 28 mmol/L (ref 20–29)
Calcium: 9.7 mg/dL (ref 8.7–10.3)
Chloride: 96 mmol/L (ref 96–106)
Creatinine, Ser: 0.83 mg/dL (ref 0.57–1.00)
GFR calc Af Amer: 75 mL/min/{1.73_m2} (ref >59)
GFR calc non Af Amer: 65 mL/min/{1.73_m2} (ref >59)
Glucose: 139 mg/dL — ABNORMAL HIGH (ref 65–99)
Potassium: 4.4 mmol/L (ref 3.5–5.2)
Sodium: 143 mmol/L (ref 134–144)

## 2019-12-04 DIAGNOSIS — R2681 Unsteadiness on feet: Secondary | ICD-10-CM | POA: Diagnosis not present

## 2019-12-04 DIAGNOSIS — M6281 Muscle weakness (generalized): Secondary | ICD-10-CM | POA: Diagnosis not present

## 2019-12-04 NOTE — Progress Notes (Signed)
Cardiology Clinic Note   Patient Name: Barbara Kelley Date of Encounter: 12/08/2019  Primary Care Provider:  [name], MD Primary Cardiologist:  Barbara Ruths, MD  Patient Profile    Barbara Kelley 86 year old female presents today for follow-up of her NICM, chronic systolic heart failure, mitral regurgitation, and shortness of breath.  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 ; deficit of CVA in 2008   Pneumonia    x2 most recent fall of 2017    PONV (postoperative nausea and vomiting)    Stroke ([place]) 2008   deficit of neuroplathy in left hand and left foot    Past Surgical History:  Procedure Laterality Date   ABDOMINAL HYSTERECTOMY     APPENDECTOMY     BLADDER SUSPENSION     CARDIAC CATHETERIZATION  10/2015   CHOLECYSTECTOMY     HEMIARTHROPLASTY HIP Right 10/20/2017   HIP ARTHROPLASTY Right 10/19/2017   Procedure: ARTHROPLASTY BIPOLAR HIP (HEMIARTHROPLASTY);  Surgeon: Barbara Cancel, MD;  Location: [facility];  Service: Orthopedics;  Laterality: Right;   JOINT REPLACEMENT Left 2013   left hip   TONSILLECTOMY     TOTAL KNEE ARTHROPLASTY Left 05/04/2017   Procedure: LEFT TOTAL KNEE ARTHROPLASTY;  Surgeon: Barbara Cancel, MD;  Location: [facility];  Service: Orthopedics;  Laterality: Left;  70 mins   TUBAL LIGATION      Allergies  Allergies  Allergen Reactions   Doxycycline Nausea Only    Vertigo   Macrobid [Nitrofurantoin] Other (See Comments)    Nerve pain   Valsartan Other (See Comments)    Blood pressure rise   Lisinopril Cough   Calcium Channel Blockers Palpitations    Blow pressure rise / tachycardia    Penicillins Rash    Has patient had a PCN reaction causing immediate rash,  facial/tongue/throat swelling, SOB or lightheadedness with hypotension: Yes Has patient had a PCN reaction causing severe rash involving mucus membranes or skin necrosis: Yes Has patient had a PCN reaction that required hospitalization: Unk Has patient had a PCN reaction occurring within the last 10 years: No If all of the above answers are "NO", then may proceed with Cephalosporin use.     History of Present Illness    Barbara Kelley has a past medical history of chronic systolic heart failure, hypertensive heart disease/NICM, mitral valve regurgitation, and mild CAD.  She was recently admitted to to the hospital on 10/08/2019-10/16/2019 for CHF exacerbation.  She is followed by Barbara Kelley.  She is undergone cardiac catheterization which is not shown in epic.  Her cardiac catheterization revealed mild coronary artery disease 30% left main, and a EF of 40%.  Her echocardiogram from 10/2017 showed an EF of 0000000, grade 2 diastolic dysfunction, trivial TR, mild MR, and moderate dilation of the LA/RA.  Follow-up 2D echocardiogram showed EF of 50%, dilated LV, moderate LVH, severe LAE, moderate RAE, moderate MR, and mild TR.  She was seen by Barbara Kelley 04/05/2019 for decompensated heart failure.  Over the previous couple of months she has noticed increasing lower extremity edema and orthopnea despite increasing Lasix.  She then presented to the ER on 1/4 with worsening symptoms of CHF.  Her BNP was  greater than 4500 at the time.  Her chest x-ray showed moderate right pleural effusion with adjacent opacities possible atelectasis or infiltrates.  Her EKG at that time showed normal sinus rhythm 90 bpm PVC and nonspecific T wave changes inferior leads with extreme axis deviation.  She was diuresed 8.9 L and her weight went from 127 pounds to 95 pounds.  Her digoxin was reduced to 0.0658 mg daily, her Coreg Entresto and spironolactone were continued.  With a plan to titrate medications up as outpatient.  At the  day of discharge (10/16/2019) her Lasix was reduced to 40 mg twice daily due to hypotension.  She presented to the clinic 11/21/19 for follow-up and stated she felt much better.  She had been doing to group exercise 5 days/week for about 45 minutes at a time.  Her exercises included balance walking and strength exercises.  She stated that she was following a low-sodium diet.   She was weighing daily at the same time after voiding.  When asked about her Delene Loll she stated that she was still taking the lower dose 24-26 mg.  I  increased her Delene Loll today to 85-51.   She presents for follow-up today and states she continues to be physically active and watch salt intake with her diet.  She is tolerating Entresto 49-51 well.  She states she feels better now than she has in quite some time.  She has gained about 1 pound since we have seen her last.  However, she states that she feels like she has an appetite now and is able to enjoy food again.  Feel her dry weight is around her current weight at 100 to 102 pounds.  I instructed her to call the office if she gains more than 3 pounds overnight or 5 pounds in a week.  I will order a BMP today and have her follow-up with Barbara Kelley in 3 months.  She denies chest pain, shortness of breath, lower extremity edema, fatigue, palpitations, melena, hematuria, hemoptysis, diaphoresis, weakness, presyncope, syncope, orthopnea, and PND.   Home Medications    Prior to Admission medications   Medication Sig Start Date End Date Taking? Authorizing Provider  b complex vitamins tablet Take 1 tablet by mouth daily.    [provider]  bisacodyl (DULCOLAX) 10 MG suppository Place 10 mg rectally daily as needed for moderate constipation (If not relieved by MOM).    [provider]  calcium carbonate (CALCIUM 600) 600 MG TABS tablet Take 600 mg by mouth at bedtime.     [provider]  carvedilol (COREG) 6.25 MG tablet Take 1 tablet (6.25 mg  total) by mouth 2 (two) times daily with a meal. 11/01/19   Medina-Vargas, Monina C, NP  Coenzyme Q10 (COQ10) 200 MG CAPS Take 1 capsule by mouth daily. 11/01/19   Medina-Vargas, Monina C, NP  Digoxin 62.5 MCG TABS Take 0.0625 mg by mouth daily. 11/01/19   Medina-Vargas, Monina C, NP  fluticasone (FLONASE) 50 MCG/ACT nasal spray Place 1 spray into both nostrils daily. AS NEEDED FOR ALLERGIC RHINITIS 11/01/19   Medina-Vargas, Monina C, NP  furosemide (LASIX) 40 MG tablet Take 1 tablet (40 mg total) by mouth 2 (two) times daily. 11/01/19   Medina-Vargas, Monina C, NP  Ginkgo Biloba 40 MG CAPS TAKE 3 CAPSULES (120MG ) BY MOUTH ONCE DAILY FOR SUPPLEMENT (DO NOT CRUSH) 11/01/19   Medina-Vargas, Monina C, NP  Multiple Vitamins-Minerals (MULTIVITAMIN WITH MINERALS) tablet Take 1 tablet by mouth daily.  [provider]  Baruch Gouty ADD MEDPASS 120ML TWO TIMES A DAY    [provider]  sacubitril-valsartan (ENTRESTO) 49-51 MG Take 1 tablet by mouth 2 (two) times daily. 11/01/19   Medina-Vargas, Monina C, NP  Sodium Phosphates (RA SALINE ENEMA RE) Place 1 each rectally daily as needed.    [provider]  spironolactone (ALDACTONE) 25 MG tablet Take 0.5 tablets (12.5 mg total) by mouth daily. 12/01/19   Lelon Perla, MD  Vitamin D3 (VITAMIN D) 25 MCG tablet Take 1,000 Units by mouth daily. For supplement    [provider]  zinc gluconate 50 MG tablet Take 1 tablet (50 mg total) by mouth daily. 11/01/19   Medina-Vargas, Senaida Lange, NP    Family History    Family History  Problem Relation Age of Onset   Diabetes Mother    Ovarian cancer Mother    Lung cancer Father    Diabetes Sister    Stroke Maternal Grandfather    She indicated that the status of her mother is unknown. She indicated that the status of her father is unknown. She indicated that the status of her sister is unknown. She indicated that the status of her maternal grandfather is unknown.  Social  History    Social History   Socioeconomic History   Marital status: Widowed    Spouse name: Not on file   Number of children: Not on file   Years of education: Not on file   Highest education level: Not on file  Occupational History   Not on file  Tobacco Use   Smoking status: Never Smoker   Smokeless tobacco: Never Used  Substance and Sexual Activity   Alcohol use: No   Drug use: No   Sexual activity: Not on file  Other Topics Concern   Not on file  Social History Narrative   Not on file   Social Determinants of Health   Financial Resource Strain:    Difficulty of Paying Living Expenses: Not on file  Food Insecurity:    Worried About [place] in the Last Year: Not on file   Ran Out of Food in the Last Year: Not on file  Transportation Needs:    Lack of Transportation (Medical): Not on file   Lack of Transportation (Non-Medical): Not on file  Physical Activity:    Days of Exercise per Week: Not on file   Minutes of Exercise per Session: Not on file  Stress:    Feeling of Stress : Not on file  Social Connections:    Frequency of Communication with Friends and Family: Not on file   Frequency of Social Gatherings with Friends and Family: Not on file   Attends Religious Services: Not on file   Active Member of Clubs or Organizations: Not on file   Attends Archivist Meetings: Not on file   Marital Status: Not on file  Intimate Partner Violence:    Fear of Current or Ex-Partner: Not on file   Emotionally Abused: Not on file   Physically Abused: Not on file   Sexually Abused: Not on file     Review of Systems    General:  No chills, fever, night sweats or weight changes.  Cardiovascular:  No chest pain, dyspnea on exertion, edema, orthopnea, palpitations, paroxysmal nocturnal dyspnea. Dermatological: No rash, lesions/masses Respiratory: No cough, dyspnea Urologic: No hematuria, dysuria Abdominal:   No nausea,  vomiting, diarrhea, bright red blood per rectum, melena,  or hematemesis Neurologic:  No visual changes, wkns, changes in mental status. All other systems reviewed and are otherwise negative except as noted above.  Physical Exam    VS:  BP 128/74    Pulse 61    Temp (!) 97 F (36.1 Kelley)    Ht 4\' 11"  (1.499 m)    Wt 102 lb 12.8 oz (46.6 kg)    SpO2 98%    BMI 20.76 kg/m  , BMI Body mass index is 20.76 kg/m. GEN: Well nourished, well developed, in no acute distress. HEENT: normal. Neck: Supple, no JVD, carotid bruits, or masses. Cardiac: RRR, no murmurs, rubs, or gallops. No clubbing, cyanosis, edema.  Radials/DP/PT 2+ and equal bilaterally.  Respiratory:  Respirations regular and unlabored, clear to auscultation bilaterally. GI: Soft, nontender, nondistended, BS + x 4. MS: no deformity or atrophy. Skin: warm and dry, no rash. Neuro:  Strength and sensation are intact. Psych: Normal affect.  Accessory Clinical Findings    ECG personally reviewed by me today-none today  EKG 10/10/2019 Sinus rhythm with occasional PVCs right axis deviation anterior infarct undetermined age 18 bpm  Echocardiogram 10/09/2019 IMPRESSIONS    1. Left ventricular ejection fraction, by visual estimation, is 10 %%.  The left ventricle has severely decreased function. There is no left  ventricular hypertrophy.  2. Left ventricular diastolic parameters are consistent with Grade II  diastolic dysfunction (pseudonormalization).  3. Severely dilated left ventricular internal cavity size.  4. The left ventricle demonstrates global hypokinesis.  5. Global right ventricle has low normal systolic function.The right  ventricular size is normal. No increase in right ventricular wall  thickness.  6. Left atrial size was severely dilated.  7. Right atrial size was severely dilated.  8. The mitral valve is normal in structure. Mild to moderate mitral valve  regurgitation.  9. The tricuspid valve is grossly  normal.  10. The aortic valve is tricuspid. Aortic valve regurgitation is not  visualized. No evidence of aortic valve sclerosis or stenosis.  11. The pulmonic valve was normal in structure. Pulmonic valve  regurgitation is trivial.  12. Moderately elevated pulmonary artery systolic pressure.  13. The atrial septum is grossly normal.  Assessment & Plan   1.  Acute on chronic systolic heart failure/nonischemic cardiomyopathy-no increased work of breathing today.  Echocardiogram 10/09/2019 showed LVEF of [id number], grade 2 diastolic dysfunction, severely dilated LV, severe bilateral enlargement, mild to moderate MR and moderate pulmonary hypertension.  BNP during mission greater than 4500.  Diuresed 8.9 L.  Weight at discharge 95 pounds and weight today 101 pounds.  She states she weighs 98 pounds at home and has maintained at that weight.  BMP on 12/01/2019 was stable. Continue digoxin 0.0625 mg daily Continue carvedilol 6.25 mg twice daily Continue Entresto 49-51 twice daily Continue spironolactone 12.5 mg daily Continue furosemide 40 mg twice daily Daily weights-call office for weight increase of 3 pounds overnight or 5 pounds in 1 week. Repeat echocardiogram in 2 months. Order BMP  Essential hypertension-BP today 128/74.  Well-controlled at home. Continue digoxin 0.0625 mg daily Continue carvedilol 6.25 mg twice daily Continue Entresto 49-51 twice daily Continue spironolactone 12.5 mg daily Heart healthy low-sodium diet no salty 6 given Continue blood pressure log   Right pleural effusion status post thoracentesis-post thoracentesis she was weaned off oxygen.   Continues to deny PND/orthopnea. Followed by pulmonary  Disposition: Follow-up with Barbara Kelley in 3 months.   Jossie Ng. [place]  [facility] [phone] Fax [phone]

## 2019-12-05 DIAGNOSIS — R2689 Other abnormalities of gait and mobility: Secondary | ICD-10-CM | POA: Diagnosis not present

## 2019-12-05 DIAGNOSIS — M6281 Muscle weakness (generalized): Secondary | ICD-10-CM | POA: Diagnosis not present

## 2019-12-06 DIAGNOSIS — R2681 Unsteadiness on feet: Secondary | ICD-10-CM | POA: Diagnosis not present

## 2019-12-06 DIAGNOSIS — M6281 Muscle weakness (generalized): Secondary | ICD-10-CM | POA: Diagnosis not present

## 2019-12-07 DIAGNOSIS — R2689 Other abnormalities of gait and mobility: Secondary | ICD-10-CM | POA: Diagnosis not present

## 2019-12-07 DIAGNOSIS — R2681 Unsteadiness on feet: Secondary | ICD-10-CM | POA: Diagnosis not present

## 2019-12-07 DIAGNOSIS — M6281 Muscle weakness (generalized): Secondary | ICD-10-CM | POA: Diagnosis not present

## 2019-12-08 ENCOUNTER — Encounter: Payer: Self-pay | Admitting: General Practice

## 2019-12-08 ENCOUNTER — Other Ambulatory Visit: Payer: Self-pay

## 2019-12-08 ENCOUNTER — Ambulatory Visit: Payer: Medicare PPO | Admitting: General Practice

## 2019-12-08 VITALS — BP 128/74 | HR 61 | Temp 97.0°F | Ht 59.0 in | Wt 102.8 lb

## 2019-12-08 DIAGNOSIS — I10 Essential (primary) hypertension: Secondary | ICD-10-CM | POA: Diagnosis not present

## 2019-12-08 DIAGNOSIS — Z79899 Other long term (current) drug therapy: Secondary | ICD-10-CM | POA: Diagnosis not present

## 2019-12-08 DIAGNOSIS — I5022 Chronic systolic (congestive) heart failure: Secondary | ICD-10-CM | POA: Diagnosis not present

## 2019-12-08 DIAGNOSIS — J90 Pleural effusion, not elsewhere classified: Secondary | ICD-10-CM

## 2019-12-08 DIAGNOSIS — I428 Other cardiomyopathies: Secondary | ICD-10-CM | POA: Diagnosis not present

## 2019-12-08 NOTE — Patient Instructions (Signed)
Medication Instructions:  The current medical regimen is effective;  continue present plan and medications as directed. Please refer to the Current Medication list given to you today. If you need a refill on your cardiac medications before your next appointment, please call your pharmacy.  Labwork: BMET TODAY HERE IN OUR OFFICE AT [facility]    If you have labs (blood work) drawn today and your tests are completely normal, you will receive your results only by: Marland Kitchen MyChart Message (if you have MyChart) OR A paper copy in the mail If you have any lab test that is abnormal or we need to change your treatment, we will call you to review these results. You may go to any [facility] lab that is convenient for you however, we do have a lab in our office that is able to assist you. You do NOT need an appointment for our lab. Once in our office in our office lobby there is a podium where you sign-in and ring the doorbell to alert [place] that you are here. Lab is open 8:00am and closes at 4:00pm; closes for lunch from 12:45 - 1:45pm. PLEASE BRING A COPY OF YOUR INSURANCE CARD WITH YOU.  Testing/Procedures: Echocardiogram - IN MAY. Your physician has requested that you have an echocardiogram. Echocardiography is a painless test that uses sound waves to create images of your heart. It provides your doctor with information about the size and shape of your heart and how well your heart's chambers and valves are working. This procedure takes approximately one hour. There are no restrictions for this procedure. This will be performed at our [facility] location - [address], [address].  Special Instructions: PLEASE READ AND FOLLOW SALTY 6 ATTACHED  Follow-Up: 3 months  In Person Kirk Ruths, MD.    At [facility], you and your health needs are our priority.  As part of our continuing mission to provide you with exceptional heart care, we have created designated Provider Care Teams.  These Care Teams include your  primary Cardiologist (physician) and Advanced Practice Providers (APPs -  Physician Assistants and Nurse Practitioners) who all work together to provide you with the care you need, when you need it.  Reduce your risk of getting COVID-19 With your heart disease it is especially important for people at increased risk of severe illness from COVID-19, and those who live with them, to protect themselves from getting COVID-19. The best way to protect yourself and to help reduce the spread of the virus that causes COVID-19 is to: Marland Kitchen Limit your interactions with other people as much as possible. . Take COVID-19 when you do interact with others. If you start feeling sick and think you may have COVID-19, get in touch with your healthcare provider within 24 hours.  Thank you for choosing [facility] at [facility]!!

## 2019-12-09 LAB — BASIC METABOLIC PANEL
BUN/Creatinine Ratio: 30 — ABNORMAL HIGH (ref 12–28)
BUN: 25 mg/dL (ref 8–27)
CO2: 30 mmol/L — ABNORMAL HIGH (ref 20–29)
Calcium: 9.7 mg/dL (ref 8.7–10.3)
Chloride: 99 mmol/L (ref 96–106)
Creatinine, Ser: 0.83 mg/dL (ref 0.57–1.00)
GFR calc Af Amer: 75 mL/min/{1.73_m2} (ref >59)
GFR calc non Af Amer: 65 mL/min/{1.73_m2} (ref >59)
Glucose: 122 mg/dL — ABNORMAL HIGH (ref 65–99)
Potassium: 4.7 mmol/L (ref 3.5–5.2)
Sodium: 143 mmol/L (ref 134–144)

## 2019-12-10 DIAGNOSIS — R2689 Other abnormalities of gait and mobility: Secondary | ICD-10-CM | POA: Diagnosis not present

## 2019-12-10 DIAGNOSIS — M6281 Muscle weakness (generalized): Secondary | ICD-10-CM | POA: Diagnosis not present

## 2019-12-11 DIAGNOSIS — R2689 Other abnormalities of gait and mobility: Secondary | ICD-10-CM | POA: Diagnosis not present

## 2019-12-11 DIAGNOSIS — M6281 Muscle weakness (generalized): Secondary | ICD-10-CM | POA: Diagnosis not present

## 2019-12-11 DIAGNOSIS — R2681 Unsteadiness on feet: Secondary | ICD-10-CM | POA: Diagnosis not present

## 2019-12-12 DIAGNOSIS — M6281 Muscle weakness (generalized): Secondary | ICD-10-CM | POA: Diagnosis not present

## 2019-12-12 DIAGNOSIS — R2689 Other abnormalities of gait and mobility: Secondary | ICD-10-CM | POA: Diagnosis not present

## 2019-12-13 DIAGNOSIS — R2681 Unsteadiness on feet: Secondary | ICD-10-CM | POA: Diagnosis not present

## 2019-12-13 DIAGNOSIS — M6281 Muscle weakness (generalized): Secondary | ICD-10-CM | POA: Diagnosis not present

## 2019-12-14 DIAGNOSIS — M6281 Muscle weakness (generalized): Secondary | ICD-10-CM | POA: Diagnosis not present

## 2019-12-14 DIAGNOSIS — R2681 Unsteadiness on feet: Secondary | ICD-10-CM | POA: Diagnosis not present

## 2019-12-18 DIAGNOSIS — M6281 Muscle weakness (generalized): Secondary | ICD-10-CM | POA: Diagnosis not present

## 2019-12-18 DIAGNOSIS — R2681 Unsteadiness on feet: Secondary | ICD-10-CM | POA: Diagnosis not present

## 2019-12-20 DIAGNOSIS — R2689 Other abnormalities of gait and mobility: Secondary | ICD-10-CM | POA: Diagnosis not present

## 2019-12-20 DIAGNOSIS — M6281 Muscle weakness (generalized): Secondary | ICD-10-CM | POA: Diagnosis not present

## 2019-12-21 DIAGNOSIS — M6281 Muscle weakness (generalized): Secondary | ICD-10-CM | POA: Diagnosis not present

## 2019-12-21 DIAGNOSIS — R2681 Unsteadiness on feet: Secondary | ICD-10-CM | POA: Diagnosis not present

## 2019-12-22 DIAGNOSIS — R2689 Other abnormalities of gait and mobility: Secondary | ICD-10-CM | POA: Diagnosis not present

## 2019-12-22 DIAGNOSIS — M6281 Muscle weakness (generalized): Secondary | ICD-10-CM | POA: Diagnosis not present

## 2019-12-25 DIAGNOSIS — R2689 Other abnormalities of gait and mobility: Secondary | ICD-10-CM | POA: Diagnosis not present

## 2019-12-25 DIAGNOSIS — M6281 Muscle weakness (generalized): Secondary | ICD-10-CM | POA: Diagnosis not present

## 2019-12-27 DIAGNOSIS — R2689 Other abnormalities of gait and mobility: Secondary | ICD-10-CM | POA: Diagnosis not present

## 2019-12-27 DIAGNOSIS — R2681 Unsteadiness on feet: Secondary | ICD-10-CM | POA: Diagnosis not present

## 2019-12-27 DIAGNOSIS — M6281 Muscle weakness (generalized): Secondary | ICD-10-CM | POA: Diagnosis not present

## 2019-12-28 DIAGNOSIS — M6281 Muscle weakness (generalized): Secondary | ICD-10-CM | POA: Diagnosis not present

## 2019-12-28 DIAGNOSIS — R2681 Unsteadiness on feet: Secondary | ICD-10-CM | POA: Diagnosis not present

## 2019-12-29 DIAGNOSIS — R2689 Other abnormalities of gait and mobility: Secondary | ICD-10-CM | POA: Diagnosis not present

## 2019-12-29 DIAGNOSIS — M6281 Muscle weakness (generalized): Secondary | ICD-10-CM | POA: Diagnosis not present

## 2020-01-01 ENCOUNTER — Other Ambulatory Visit: Payer: Self-pay | Admitting: Cardiology

## 2020-01-01 DIAGNOSIS — M6281 Muscle weakness (generalized): Secondary | ICD-10-CM | POA: Diagnosis not present

## 2020-01-01 DIAGNOSIS — R2681 Unsteadiness on feet: Secondary | ICD-10-CM | POA: Diagnosis not present

## 2020-01-01 DIAGNOSIS — R2689 Other abnormalities of gait and mobility: Secondary | ICD-10-CM | POA: Diagnosis not present

## 2020-01-02 DIAGNOSIS — R2681 Unsteadiness on feet: Secondary | ICD-10-CM | POA: Diagnosis not present

## 2020-01-02 DIAGNOSIS — M6281 Muscle weakness (generalized): Secondary | ICD-10-CM | POA: Diagnosis not present

## 2020-01-03 DIAGNOSIS — M6281 Muscle weakness (generalized): Secondary | ICD-10-CM | POA: Diagnosis not present

## 2020-01-03 DIAGNOSIS — R2689 Other abnormalities of gait and mobility: Secondary | ICD-10-CM | POA: Diagnosis not present

## 2020-01-04 DIAGNOSIS — M6281 Muscle weakness (generalized): Secondary | ICD-10-CM | POA: Diagnosis not present

## 2020-01-04 DIAGNOSIS — R2681 Unsteadiness on feet: Secondary | ICD-10-CM | POA: Diagnosis not present

## 2020-01-08 DIAGNOSIS — R2681 Unsteadiness on feet: Secondary | ICD-10-CM | POA: Diagnosis not present

## 2020-01-08 DIAGNOSIS — M6281 Muscle weakness (generalized): Secondary | ICD-10-CM | POA: Diagnosis not present

## 2020-01-09 DIAGNOSIS — M6281 Muscle weakness (generalized): Secondary | ICD-10-CM | POA: Diagnosis not present

## 2020-01-09 DIAGNOSIS — R2689 Other abnormalities of gait and mobility: Secondary | ICD-10-CM | POA: Diagnosis not present

## 2020-01-10 DIAGNOSIS — M6281 Muscle weakness (generalized): Secondary | ICD-10-CM | POA: Diagnosis not present

## 2020-01-10 DIAGNOSIS — R2681 Unsteadiness on feet: Secondary | ICD-10-CM | POA: Diagnosis not present

## 2020-01-11 DIAGNOSIS — M6281 Muscle weakness (generalized): Secondary | ICD-10-CM | POA: Diagnosis not present

## 2020-01-11 DIAGNOSIS — R2681 Unsteadiness on feet: Secondary | ICD-10-CM | POA: Diagnosis not present

## 2020-01-12 DIAGNOSIS — M6281 Muscle weakness (generalized): Secondary | ICD-10-CM | POA: Diagnosis not present

## 2020-01-12 DIAGNOSIS — R2689 Other abnormalities of gait and mobility: Secondary | ICD-10-CM | POA: Diagnosis not present

## 2020-01-17 DIAGNOSIS — M6281 Muscle weakness (generalized): Secondary | ICD-10-CM | POA: Diagnosis not present

## 2020-01-17 DIAGNOSIS — R2689 Other abnormalities of gait and mobility: Secondary | ICD-10-CM | POA: Diagnosis not present

## 2020-02-06 ENCOUNTER — Other Ambulatory Visit (HOSPITAL_COMMUNITY): Payer: Medicare PPO

## 2020-02-27 ENCOUNTER — Ambulatory Visit (HOSPITAL_COMMUNITY): Payer: Medicare PPO | Attending: Cardiology

## 2020-02-27 ENCOUNTER — Other Ambulatory Visit: Payer: Self-pay

## 2020-02-27 DIAGNOSIS — I5022 Chronic systolic (congestive) heart failure: Secondary | ICD-10-CM | POA: Diagnosis not present

## 2020-02-27 DIAGNOSIS — I428 Other cardiomyopathies: Secondary | ICD-10-CM | POA: Diagnosis not present

## 2020-02-28 NOTE — Progress Notes (Signed)
HPI: Follow-up nonischemic cardiomyopathy, chronic systolic congestive heart failure, history of mild coronary artery disease and hypertension.  Previous cardiac catheterization showed mild coronary artery disease with 30% left main.  Patient admitted with congestive heart failure January 2021.  Ejection fraction on echocardiogram [id number], grade 2 diastolic dysfunction, severe left ventricular enlargement, severe biatrial enlargement, mild to moderate mitral regurgitation.  Patient underwent thoracentesis and was diuresed with improvement.  Follow-up echocardiogram May 2021 showed improvement in LV function with ejection fraction 35 to [id number], grade 1 diastolic dysfunction, mild left ventricular hypertrophy and moderate left atrial enlargement.  Since last seen she denies dyspnea, chest pain, palpitations, syncope or pedal edema.  Current Outpatient Medications  Medication Sig Dispense Refill   b complex vitamins tablet Take 1 tablet by mouth daily.     bisacodyl (DULCOLAX) 10 MG suppository Place 10 mg rectally daily as needed for moderate constipation (If not relieved by MOM).     calcium carbonate (CALCIUM 600) 600 MG TABS tablet Take 600 mg by mouth at bedtime.      carvedilol (COREG) 6.25 MG tablet Take 1 tablet (6.25 mg total) by mouth 2 (two) times daily with a meal. 60 tablet 0   Coenzyme Q10 (COQ10) 200 MG CAPS Take 1 capsule by mouth daily. 30 capsule 0   Digoxin 62.5 MCG TABS Take 0.0625 mg by mouth daily. 30 tablet 0   fluticasone (FLONASE) 50 MCG/ACT nasal spray Place 1 spray into both nostrils daily. AS NEEDED FOR ALLERGIC RHINITIS 9.9 mL 0   furosemide (LASIX) 40 MG tablet Take 1 tablet (40 mg total) by mouth 2 (two) times daily. 60 tablet 0   Ginkgo Biloba 40 MG CAPS TAKE 3 CAPSULES (120MG ) BY MOUTH ONCE DAILY FOR SUPPLEMENT (DO NOT CRUSH) 90 capsule 0   Multiple Vitamins-Minerals (MULTIVITAMIN WITH MINERALS) tablet Take 1 tablet by mouth daily.     NON FORMULARY ADD  MEDPASS 120ML TWO TIMES A DAY     sacubitril-valsartan (ENTRESTO) 49-51 MG Take 1 tablet by mouth 2 (two) times daily. 60 tablet 0   Sodium Phosphates (RA SALINE ENEMA RE) Place 1 each rectally daily as needed.     spironolactone (ALDACTONE) 25 MG tablet Take 0.5 tablets (12.5 mg total) by mouth daily. 15 tablet 3   Vitamin D3 (VITAMIN D) 25 MCG tablet Take 1,000 Units by mouth daily. For supplement     zinc gluconate 50 MG tablet Take 1 tablet (50 mg total) by mouth daily. 30 tablet 0   No current facility-administered medications for this visit.     Past Medical History:  Diagnosis Date   Arthritis    Cardiomyopathy ([place])    Congestive heart failure (CHF) (HCC)    Dyspnea    GERD (gastroesophageal reflux disease)    currently non problematic    Hypertension    Neuropathy    related to stroke ; deficit of CVA in 2008   Pneumonia    x2 most recent fall of 2017    PONV (postoperative nausea and vomiting)    Stroke ([place]) 2008   deficit of neuroplathy in left hand and left foot     Past Surgical History:  Procedure Laterality Date   ABDOMINAL HYSTERECTOMY     APPENDECTOMY     BLADDER SUSPENSION     CARDIAC CATHETERIZATION  10/2015   CHOLECYSTECTOMY     HEMIARTHROPLASTY HIP Right 10/20/2017   HIP ARTHROPLASTY Right 10/19/2017   Procedure: ARTHROPLASTY BIPOLAR HIP (HEMIARTHROPLASTY);  Surgeon:  Paralee Cancel, MD;  Location: [place];  Service: Orthopedics;  Laterality: Right;   JOINT REPLACEMENT Left 2013   left hip   TONSILLECTOMY     TOTAL KNEE ARTHROPLASTY Left 05/04/2017   Procedure: LEFT TOTAL KNEE ARTHROPLASTY;  Surgeon: Paralee Cancel, MD;  Location: [facility];  Service: Orthopedics;  Laterality: Left;  70 mins   TUBAL LIGATION      Social History   Socioeconomic History   Marital status: Widowed    Spouse name: Not on file   Number of children: Not on file   Years of education: Not on file   Highest education level: Not on file   Occupational History   Not on file  Tobacco Use   Smoking status: Never Smoker   Smokeless tobacco: Never Used  Substance and Sexual Activity   Alcohol use: No   Drug use: No   Sexual activity: Not on file  Other Topics Concern   Not on file  Social History Narrative   Not on file   Social Determinants of Health   Financial Resource Strain:    Difficulty of Paying Living Expenses:   Food Insecurity:    Worried About [place] in the Last Year:    Arboriculturist in the Last Year:   Transportation Needs:    Film/video editor (Medical):    Lack of Transportation (Non-Medical):   Physical Activity:    Days of Exercise per Week:    Minutes of Exercise per Session:   Stress:    Feeling of Stress :   Social Connections:    Frequency of Communication with Friends and Family:    Frequency of Social Gatherings with Friends and Family:    Attends Religious Services:    Active Member of Clubs or Organizations:    Attends Music therapist:    Marital Status:   Intimate Partner Violence:    Fear of Current or Ex-Partner:    Emotionally Abused:    Physically Abused:    Sexually Abused:     Family History  Problem Relation Age of Onset   Diabetes Mother    Ovarian cancer Mother    Lung cancer Father    Diabetes Sister    Stroke Maternal Grandfather     ROS: no fevers or chills, productive cough, hemoptysis, dysphasia, odynophagia, melena, hematochezia, dysuria, hematuria, rash, seizure activity, orthopnea, PND, pedal edema, claudication. Remaining systems are negative.  Physical Exam: Well-developed well-nourished in no acute distress.  Skin is warm and dry.  HEENT is normal.  Neck is supple.  Chest is clear to auscultation with normal expansion.  Cardiovascular exam is regular rate and rhythm.  Abdominal exam nontender or distended. No masses palpated. Extremities show no edema. neuro grossly  intact A/P  1 chronic systolic congestive heart failure-patient appears to be euvolemic.  Continue diuretics at present dose.  Continue fluid restriction and low-sodium diet.  Check potassium and renal function.  2 nonischemic cardiomyopathy-LV function improved on most recent echocardiogram.  Continue Entresto, carvedilol, digoxin and spironolactone.  Her systolic blood pressure today is 140 but she states typically 120 at home.  Will consider advancing carvedilol or Entresto in the future pending her follow-up blood pressure and pulse.  3 hypertension-blood pressure controlled.  Continue present medical regimen.  4 history of pleural effusion-transudative and likely secondary to CHF.  Kirk Ruths, MD

## 2020-02-29 DIAGNOSIS — H25813 Combined forms of age-related cataract, bilateral: Secondary | ICD-10-CM | POA: Diagnosis not present

## 2020-02-29 DIAGNOSIS — H35031 Hypertensive retinopathy, right eye: Secondary | ICD-10-CM | POA: Diagnosis not present

## 2020-02-29 DIAGNOSIS — H353131 Nonexudative age-related macular degeneration, bilateral, early dry stage: Secondary | ICD-10-CM | POA: Diagnosis not present

## 2020-02-29 DIAGNOSIS — H3563 Retinal hemorrhage, bilateral: Secondary | ICD-10-CM | POA: Diagnosis not present

## 2020-03-05 ENCOUNTER — Encounter: Payer: Self-pay | Admitting: Cardiology

## 2020-03-05 ENCOUNTER — Other Ambulatory Visit: Payer: Self-pay

## 2020-03-05 ENCOUNTER — Ambulatory Visit: Payer: Medicare PPO | Admitting: Cardiology

## 2020-03-05 VITALS — BP 144/61 | HR 57 | Temp 94.3°F | Ht 59.0 in | Wt 114.0 lb

## 2020-03-05 DIAGNOSIS — I5022 Chronic systolic (congestive) heart failure: Secondary | ICD-10-CM | POA: Diagnosis not present

## 2020-03-05 DIAGNOSIS — I10 Essential (primary) hypertension: Secondary | ICD-10-CM | POA: Diagnosis not present

## 2020-03-05 DIAGNOSIS — I428 Other cardiomyopathies: Secondary | ICD-10-CM

## 2020-03-05 NOTE — Patient Instructions (Signed)
Medication Instructions:  NO CHANGE *If you need a refill on your cardiac medications before your next appointment, please call your pharmacy*   Lab Work: Your physician recommends that you HAVE LAB WORK TODAY If you have labs (blood work) drawn today and your tests are completely normal, you will receive your results only by: . MyChart Message (if you have MyChart) OR . A paper copy in the mail If you have any lab test that is abnormal or we need to change your treatment, we will call you to review the results.   Follow-Up: At [facility], you and your health needs are our priority.  As part of our continuing mission to provide you with exceptional heart care, we have created designated Provider Care Teams.  These Care Teams include your primary Cardiologist (physician) and Advanced Practice Providers (APPs -  Physician Assistants and Nurse Practitioners) who all work together to provide you with the care you need, when you need it.  We recommend signing up for the patient portal called "MyChart".  Sign up information is provided on this After Visit Summary.  MyChart is used to connect with patients for Virtual Visits (Telemedicine).  Patients are able to view lab/test results, encounter notes, upcoming appointments, etc.  Non-urgent messages can be sent to your provider as well.   To learn more about what you can do with MyChart, go to [email or web address].    Your next appointment:   3 month(s)  The format for your next appointment:   In Person  Provider:   Brian Crenshaw, MD    

## 2020-03-06 ENCOUNTER — Encounter: Payer: Self-pay | Admitting: *Deleted

## 2020-03-06 LAB — BASIC METABOLIC PANEL
BUN/Creatinine Ratio: 36 — ABNORMAL HIGH (ref 12–28)
BUN: 39 mg/dL — ABNORMAL HIGH (ref 8–27)
CO2: 26 mmol/L (ref 20–29)
Calcium: 10.2 mg/dL (ref 8.7–10.3)
Chloride: 99 mmol/L (ref 96–106)
Creatinine, Ser: 1.09 mg/dL — ABNORMAL HIGH (ref 0.57–1.00)
GFR calc Af Amer: 54 mL/min/{1.73_m2} — ABNORMAL LOW (ref >59)
GFR calc non Af Amer: 47 mL/min/{1.73_m2} — ABNORMAL LOW (ref >59)
Glucose: 122 mg/dL — ABNORMAL HIGH (ref 65–99)
Potassium: 4.6 mmol/L (ref 3.5–5.2)
Sodium: 142 mmol/L (ref 134–144)

## 2020-03-07 NOTE — Progress Notes (Signed)
[facility] Note  03/08/2020     CHIEF COMPLAINT Patient presents for Retina Evaluation   HISTORY OF PRESENT ILLNESS: Barbara Kelley is a 86 y.o. female who presents to the clinic today for:   HPI    Retina Evaluation    In both eyes.  Onset: Unknown.  Duration of 2 weeks.  Associated Symptoms Floaters.  Context:  distance vision, mid-range vision and near vision.  Treatments tried include no treatments.  I, the attending physician,  performed the HPI with the patient and updated documentation appropriately.          Comments    86 y/o female pt referred by Dr. Zenia Resides on 02/29/20 for eval of ret heme OU.  Pt had made appt w/Dr. Katy Fitch for eval of dim vision OU and small clear floaters OU.  VA OU still blurred.  Pt still has small clear floaters OU.  Denies pain, FOL.  No gtts.  Pt was hospitalized in 11-07-19 for a heart issue.  Pt wonders if that has anything to do with her eye problem.       Last edited by Bernarda Caffey, MD on 03/08/2020 10:13 AM. (History)    pt reports hx of severe CHF, she states she "almost died" in 06-Nov-2022, she reports her CHF has improved since that time, she states they drained 7 liters of fluid from her body and gave her dieretics to take, she has also started exercising, she denies any changes in vision, but is here for eval of retinal hemes from Dr. Katy Fitch, she denies blood thinner use, but is on ginkgo biloba supplement, she denies being diabetic  Referring physician: Debbra Riding, MD [address] [place],  [place] [address]  HISTORICAL INFORMATION:   Selected notes from the MEDICAL RECORD NUMBER Referred by Dr. Wyatt Portela for concern of vitreous heme  LEE:  Ocular Hx- PMH-   CURRENT MEDICATIONS: No current outpatient medications on file. (Ophthalmic Drugs)   No current facility-administered medications for this visit. (Ophthalmic Drugs)   Current Outpatient Medications (Other)  Medication Sig  . b  complex vitamins tablet Take 1 tablet by mouth daily.  . bisacodyl (DULCOLAX) 10 MG suppository Place 10 mg rectally daily as needed for moderate constipation (If not relieved by MOM).  . calcium carbonate (CALCIUM 600) 600 MG TABS tablet Take 600 mg by mouth at bedtime.   . carvedilol (COREG) 6.25 MG tablet Take 1 tablet (6.25 mg total) by mouth 2 (two) times daily with a meal.  . Coenzyme Q10 (COQ10) 200 MG CAPS Take 1 capsule by mouth daily.  . Digoxin 62.5 MCG TABS Take 0.0625 mg by mouth daily.  . fluticasone (FLONASE) 50 MCG/ACT nasal spray Place 1 spray into both nostrils daily. AS NEEDED FOR ALLERGIC RHINITIS  . furosemide (LASIX) 40 MG tablet Take 1 tablet (40 mg total) by mouth 2 (two) times daily.  . Ginkgo Biloba 40 MG CAPS TAKE 3 CAPSULES (120MG) BY MOUTH ONCE DAILY FOR SUPPLEMENT (DO NOT CRUSH)  . Multiple Vitamins-Minerals (MULTIVITAMIN WITH MINERALS) tablet Take 1 tablet by mouth daily.  . NON FORMULARY ADD MEDPASS 120ML TWO TIMES A DAY  . sacubitril-valsartan (ENTRESTO) 49-51 MG Take 1 tablet by mouth 2 (two) times daily.  . Sodium Phosphates (RA SALINE ENEMA RE) Place 1 each rectally daily as needed.  Marland Kitchen spironolactone (ALDACTONE) 25 MG tablet Take 0.5 tablets (12.5 mg total) by mouth daily.  . Vitamin  D3 (VITAMIN D) 25 MCG tablet Take 1,000 Units by mouth daily. For supplement  . zinc gluconate 50 MG tablet Take 1 tablet (50 mg total) by mouth daily.   No current facility-administered medications for this visit. (Other)      REVIEW OF SYSTEMS: ROS    Positive for: Gastrointestinal, Neurological, Musculoskeletal, Cardiovascular, Eyes   Negative for: Constitutional, Skin, Genitourinary, HENT, Endocrine, Respiratory, Psychiatric, Allergic/Imm, Heme/Lymph   Last edited by Matthew Folks, COA on 03/08/2020  9:29 AM. (History)       ALLERGIES Allergies  Allergen Reactions  . Doxycycline Nausea Only    Vertigo  . Macrobid [Nitrofurantoin] Other (See Comments)    Nerve  pain  . Valsartan Other (See Comments)    Blood pressure rise  . Lisinopril Cough  . Calcium Channel Blockers Palpitations    Blow pressure rise / tachycardia   . Penicillins Rash    Has patient had a PCN reaction causing immediate rash, facial/tongue/throat swelling, SOB or lightheadedness with hypotension: Yes Has patient had a PCN reaction causing severe rash involving mucus membranes or skin necrosis: Yes Has patient had a PCN reaction that required hospitalization: Unk Has patient had a PCN reaction occurring within the last 10 years: No If all of the above answers are "NO", then may proceed with Cephalosporin use.     PAST MEDICAL HISTORY Past Medical History:  Diagnosis Date  . Arthritis   . Cardiomyopathy ([place])   . Cataract    NS OU  . Congestive heart failure (CHF) ([place])   . Dyspnea   . GERD (gastroesophageal reflux disease)    currently non problematic   . Hypertension   . Hypertensive retinopathy    OD  . 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 Pneumonia    x2 most recent fall of 2017   . PONV (postoperative nausea and vomiting)   . Stroke [facility]) 2008   deficit of neuroplathy in left hand and left foot    Past Surgical History:  Procedure Laterality Date  . ABDOMINAL HYSTERECTOMY    . APPENDECTOMY    . BLADDER SUSPENSION    . CARDIAC CATHETERIZATION  10/2015  . CHOLECYSTECTOMY    . HEMIARTHROPLASTY HIP Right 10/20/2017  . HIP ARTHROPLASTY Right 10/19/2017   Procedure: ARTHROPLASTY BIPOLAR HIP (HEMIARTHROPLASTY);  Surgeon: Paralee Cancel, MD;  Location: [place];  Service: Orthopedics;  Laterality: Right;  . JOINT REPLACEMENT Left 2013   left hip  . TONSILLECTOMY    . TOTAL KNEE ARTHROPLASTY Left 05/04/2017   Procedure: LEFT TOTAL KNEE ARTHROPLASTY;  Surgeon: Paralee Cancel, MD;  Location: [facility];  Service: Orthopedics;  Laterality: Left;  70 mins  . TUBAL LIGATION      FAMILY HISTORY Family History  Problem Relation Age of Onset  .  Diabetes Mother   . Ovarian cancer Mother   . Lung cancer Father   . Diabetes Sister   . Stroke Maternal Grandfather     SOCIAL HISTORY Social History   Tobacco Use  . Smoking status: Never Smoker  . Smokeless tobacco: Never Used  Substance Use Topics  . Alcohol use: No  . Drug use: No         OPHTHALMIC EXAM:  Base Eye Exam    Visual Acuity (Snellen - Linear)      Right Left   Dist cc 20/30 +2 20/25 -2   Dist ph cc NI NI   Correction: Glasses  Tonometry (Tonopen, 9:32 AM)      Right Left   Pressure 14 14       Pupils      Dark Light Shape React APD   Right 3 2 Round Brisk None   Left 3 2 Round Brisk None       Visual Fields (Counting fingers)      Left Right    Full Full       Extraocular Movement      Right Left    Full, Ortho Full, Ortho       Neuro/Psych    Oriented x3: Yes   Mood/Affect: Normal       Dilation    Both eyes: 1.0% Mydriacyl, 2.5% Phenylephrine @ 9:32 AM        Slit Lamp and Fundus Exam    Slit Lamp Exam      Right Left   Lids/Lashes Dermatochalasis - upper lid, Meibomian gland dysfunction Dermatochalasis - upper lid, Meibomian gland dysfunction   Conjunctiva/Sclera White and quiet White and quiet   Cornea Arcus, 1+ Punctate epithelial erosions, mild Debris in tear film Arcus, 1+ Punctate epithelial erosions, mild Debris in tear film   Anterior Chamber Deep and quiet Deep and quiet   Iris Round and dilated, No NVI Round and dilated, No NVI   Lens 2-3+ Nuclear sclerosis, 2-3+ Cortical cataract, trace Posterior subcapsular cataract 2-3+ Nuclear sclerosis, 2-3+ Cortical cataract, trace, early Posterior subcapsular cataract   Vitreous Vitreous syneresis, Posterior vitreous detachment Vitreous syneresis       Fundus Exam      Right Left   Disc trace Pallor, Sharp rim Pink and Sharp   C/D Ratio 0.3 0.3   Macula Flat, Blunted foveal reflex, Drusen, RPE mottling and clumping, early atrophy, no heme Flat, Blunted foveal  reflex, Drusen, RPE mottling, clumping and atrophy, no heme   Vessels Vascular attenuation, Tortuous Vascular attenuation, Tortuous   Periphery Attached, scattered IRH/DBH/CWS mostly posterior, reticular degeneration Attached, rare IRH/DBH mostly inferior to disc, reticular degeneration        Refraction    Wearing Rx      Sphere Cylinder Axis Add   Right +0.75 +1.75 170 +2.50   Left +1.00 +2.00 015 +2.50   Age: 32yr   Type: PAL       Manifest Refraction      Sphere Cylinder Axis Dist VA   Right +1.00 +1.50 170 20/25-2   Left +1.00 +1.25 015 20/25          IMAGING AND PROCEDURES  Imaging and Procedures for _0 @  OCT, Retina - OU - Both Eyes       Right Eye Quality was good. Central Foveal Thickness: 263. Progression has no prior data. Findings include normal foveal contour, no IRF, no SRF, retinal drusen , outer retinal atrophy (Focal patch of inner and outer retinal atrophy temporal macula caught on widefield).   Left Eye Quality was good. Central Foveal Thickness: 258. Progression has no prior data. Findings include normal foveal contour, no IRF, no SRF, retinal drusen .   Notes *Images captured and stored on drive  Diagnosis / Impression:  Non-exu ARMD OU (OD>OS)   Clinical management:  See below  Abbreviations: NFP - Normal foveal profile. CME - cystoid macular edema. PED - pigment epithelial detachment. IRF - intraretinal fluid. SRF - subretinal fluid. EZ - ellipsoid zone. ERM - epiretinal membrane. ORA - outer retinal atrophy. ORT - outer retinal tubulation. SRHM - subretinal hyper-reflective material  Fluorescein Angiography Optos (Transit OD)       Right Eye   Progression has no prior data. Early phase findings include microaneurysm, leakage, staining. Mid/Late phase findings include staining, leakage, microaneurysm (Scattered patches of punctate hyperfluorescence - MA like appearance, scattered patches of mild perivascular leakge).   Left  Eye   Progression has no prior data. Early phase findings include staining, leakage, microaneurysm. Mid/Late phase findings include leakage, staining, microaneurysm (Scattered patches of punctate hyperfluorescence - MA like appearance, scattered patches of mild perivascular leakge).   Notes **Images stored on drive**  Impression: Scattered patches of punctate hyperfluorescence - MA like appearance, scattered patches of mild perivascular leakge                 ASSESSMENT/PLAN:    ICD-10-CM   1. Hypertensive retinopathy of both eyes  H35.033 Fluorescein Angiography Optos (Transit OD)  2. Retinal hemorrhage of both eyes  H35.63   3. Intermediate stage nonexudative age-related macular degeneration of both eyes  H35.3132   4. Retinal edema  H35.81 OCT, Retina - OU - Both Eyes  5. Combined forms of age-related cataract of both eyes  H25.813     1,2. Hypertensive retinopathy with retinal hemorrhage OU (OD > OS)  - scattered IRH/DBH OU, (OD > OS)  - no history of DM, but pt reports significant cardiovascular history with severe CHF exacerbation in Jan and previously uncontrolled HTN  - pt reports cardiovascular status and HTN are significantly improved  - BCVA 20/25 OU (MRx) and pt essentially asymptomatic  - FA shows mild, perivascular leakage OU -- consistent with HTN retinopathy  - discussed findings and importance of tight BP control  - no acute intervention indicated at this time  - recommend monitoring  - f/u 3-4 months, DFE, OCT  3. Age related macular degeneration, non-exudative, both eyes  - The incidence, anatomy, and pathology of dry AMD, risk of progression, and the AREDS and AREDS 2 study including smoking risks discussed with patient.  - Recommend amsler grid monitoring  - f/u 3 months  4. No retinal edema on exam or OCT  5. Mixed cataracts OU  - The symptoms of cataract, surgical options, and treatments and risks were discussed with patient.  - discussed  diagnosis and progression  - not yet visually significant  - monitor for now   Ophthalmic Meds Ordered this visit:  No orders of the defined types were placed in this encounter.      Return for f/u 3-4 months, HTN ret w/ retinal hemes OU, DFE, OCT.  There are no Patient Instructions on file for this visit.   Explained the diagnoses, plan, and follow up with the patient and they expressed understanding.  Patient expressed understanding of the importance of proper follow up care.   This document serves as a record of services personally performed by Gardiner Sleeper, MD, PhD. It was created on their behalf by Ernest Mallick, OA, an ophthalmic assistant. The creation of this record is the provider's dictation and/or activities during the visit.    Electronically signed by: Ernest Mallick, OA 06.03.2021 1:01 PM  Gardiner Sleeper, M.D., Ph.D. Diseases & Surgery of the Retina and Vitreous [facility]  I have reviewed the above documentation for accuracy and completeness, and I agree with the above. Gardiner Sleeper, M.D., Ph.D. 03/08/20 1:01 PM  Abbreviations: M myopia (nearsighted); A astigmatism; H hyperopia (farsighted); P presbyopia; Mrx spectacle prescription;  CTL contact lenses; OD right  eye; OS left eye; OU both eyes  XT exotropia; ET esotropia; PEK punctate epithelial keratitis; PEE punctate epithelial erosions; DES dry eye syndrome; MGD meibomian gland dysfunction; ATs artificial tears; PFAT's preservative free artificial tears; [place] nuclear sclerotic cataract; PSC posterior subcapsular cataract; ERM epi-retinal membrane; PVD posterior vitreous detachment; RD retinal detachment; DM diabetes mellitus; DR diabetic retinopathy; NPDR non-proliferative diabetic retinopathy; PDR proliferative diabetic retinopathy; CSME clinically significant macular edema; DME diabetic macular edema; dbh dot blot hemorrhages; CWS cotton wool spot; POAG primary open angle glaucoma; C/D  cup-to-disc ratio; HVF humphrey visual field; GVF goldmann visual field; OCT optical coherence tomography; IOP intraocular pressure; BRVO Branch retinal vein occlusion; CRVO central retinal vein occlusion; CRAO central retinal artery occlusion; BRAO branch retinal artery occlusion; RT retinal tear; SB scleral buckle; PPV pars plana vitrectomy; VH Vitreous hemorrhage; PRP panretinal laser photocoagulation; IVK intravitreal kenalog; VMT vitreomacular traction; MH Macular hole;  NVD neovascularization of the disc; NVE neovascularization elsewhere; AREDS age related eye disease study; ARMD age related macular degeneration; POAG primary open angle glaucoma; EBMD epithelial/anterior basement membrane dystrophy; ACIOL anterior chamber intraocular lens; IOL intraocular lens; PCIOL posterior chamber intraocular lens; Phaco/IOL phacoemulsification with intraocular lens placement; [place] photorefractive keratectomy; LASIK laser assisted in situ keratomileusis; HTN hypertension; DM diabetes mellitus; COPD chronic obstructive pulmonary disease

## 2020-03-08 ENCOUNTER — Ambulatory Visit (INDEPENDENT_AMBULATORY_CARE_PROVIDER_SITE_OTHER): Payer: Medicare PPO | Admitting: Ophthalmology

## 2020-03-08 ENCOUNTER — Other Ambulatory Visit: Payer: Self-pay

## 2020-03-08 ENCOUNTER — Encounter (INDEPENDENT_AMBULATORY_CARE_PROVIDER_SITE_OTHER): Payer: Self-pay | Admitting: Ophthalmology

## 2020-03-08 DIAGNOSIS — H3581 Retinal edema: Secondary | ICD-10-CM

## 2020-03-08 DIAGNOSIS — H3563 Retinal hemorrhage, bilateral: Secondary | ICD-10-CM

## 2020-03-08 DIAGNOSIS — H25813 Combined forms of age-related cataract, bilateral: Secondary | ICD-10-CM

## 2020-03-08 DIAGNOSIS — H35033 Hypertensive retinopathy, bilateral: Secondary | ICD-10-CM

## 2020-03-08 DIAGNOSIS — H353132 Nonexudative age-related macular degeneration, bilateral, intermediate dry stage: Secondary | ICD-10-CM

## 2020-04-16 DIAGNOSIS — Z0000 Encounter for general adult medical examination without abnormal findings: Secondary | ICD-10-CM | POA: Diagnosis not present

## 2020-04-16 DIAGNOSIS — Z79899 Other long term (current) drug therapy: Secondary | ICD-10-CM | POA: Diagnosis not present

## 2020-04-16 DIAGNOSIS — I340 Nonrheumatic mitral (valve) insufficiency: Secondary | ICD-10-CM | POA: Diagnosis not present

## 2020-04-16 DIAGNOSIS — I5022 Chronic systolic (congestive) heart failure: Secondary | ICD-10-CM | POA: Diagnosis not present

## 2020-04-16 DIAGNOSIS — I429 Cardiomyopathy, unspecified: Secondary | ICD-10-CM | POA: Diagnosis not present

## 2020-04-16 DIAGNOSIS — Z131 Encounter for screening for diabetes mellitus: Secondary | ICD-10-CM | POA: Diagnosis not present

## 2020-04-16 DIAGNOSIS — I119 Hypertensive heart disease without heart failure: Secondary | ICD-10-CM | POA: Diagnosis not present

## 2020-04-16 DIAGNOSIS — I10 Essential (primary) hypertension: Secondary | ICD-10-CM | POA: Diagnosis not present

## 2020-04-16 DIAGNOSIS — I700 Atherosclerosis of aorta: Secondary | ICD-10-CM | POA: Diagnosis not present

## 2020-04-25 ENCOUNTER — Other Ambulatory Visit: Payer: Self-pay | Admitting: Physician Assistant

## 2020-04-25 ENCOUNTER — Other Ambulatory Visit: Payer: Self-pay

## 2020-04-25 ENCOUNTER — Other Ambulatory Visit: Payer: Self-pay | Admitting: Adult Health

## 2020-04-25 DIAGNOSIS — I5022 Chronic systolic (congestive) heart failure: Secondary | ICD-10-CM

## 2020-04-25 MED ORDER — CARVEDILOL 6.25 MG PO TABS: ORAL_TABLET | ORAL | 3 refills | Status: DC

## 2020-04-26 ENCOUNTER — Other Ambulatory Visit: Payer: Self-pay | Admitting: Adult Health

## 2020-04-26 DIAGNOSIS — I5022 Chronic systolic (congestive) heart failure: Secondary | ICD-10-CM

## 2020-04-30 ENCOUNTER — Other Ambulatory Visit: Payer: Self-pay | Admitting: Physician Assistant

## 2020-04-30 DIAGNOSIS — I5022 Chronic systolic (congestive) heart failure: Secondary | ICD-10-CM

## 2020-04-30 MED ORDER — CARVEDILOL 6.25 MG PO TABS: ORAL_TABLET | ORAL | 1 refills | Status: DC

## 2020-04-30 NOTE — Addendum Note (Signed)
Addended by: Lanna Poche R on: 04/30/2020 02:42 PM   Modules accepted: Orders

## 2020-04-30 NOTE — Addendum Note (Signed)
Addended by: Gaetano Net on: 04/30/2020 02:46 PM   Modules accepted: Orders

## 2020-05-01 ENCOUNTER — Other Ambulatory Visit (HOSPITAL_COMMUNITY): Payer: Self-pay | Admitting: Family Medicine

## 2020-05-01 DIAGNOSIS — I6521 Occlusion and stenosis of right carotid artery: Secondary | ICD-10-CM

## 2020-05-03 ENCOUNTER — Other Ambulatory Visit: Payer: Self-pay | Admitting: Adult Health

## 2020-05-03 ENCOUNTER — Other Ambulatory Visit: Payer: Self-pay

## 2020-05-03 ENCOUNTER — Ambulatory Visit (HOSPITAL_COMMUNITY)
Admission: RE | Admit: 2020-05-03 | Discharge: 2020-05-03 | Disposition: A | Discharge status: Home / Self Care | Payer: Medicare PPO | Source: Ambulatory Visit | Attending: Cardiovascular Disease | Admitting: Cardiovascular Disease

## 2020-05-03 DIAGNOSIS — I5022 Chronic systolic (congestive) heart failure: Secondary | ICD-10-CM

## 2020-05-03 DIAGNOSIS — I6521 Occlusion and stenosis of right carotid artery: Secondary | ICD-10-CM | POA: Insufficient documentation

## 2020-05-06 ENCOUNTER — Other Ambulatory Visit: Payer: Self-pay

## 2020-05-06 DIAGNOSIS — I5022 Chronic systolic (congestive) heart failure: Secondary | ICD-10-CM

## 2020-05-06 MED ORDER — SACUBITRIL-VALSARTAN 49-51 MG PO TABS: 1.0000 | ORAL_TABLET | Freq: Two times a day (BID) | ORAL | 0 refills | Status: DC

## 2020-05-06 NOTE — Telephone Encounter (Signed)
This is Dr. Crenshaw's pt 

## 2020-06-03 NOTE — Progress Notes (Signed)
HPI: Follow-up nonischemic cardiomyopathy, chronic systolic congestive heart failure, history of mild coronary artery disease and hypertension.  Previous cardiac catheterization showed mild coronary artery disease with 30% left main.  Patient admitted with congestive heart failure January 2021.  Ejection fraction on echocardiogram 88%, grade 2 diastolic dysfunction, severe left ventricular enlargement, severe biatrial enlargement, mild to moderate mitral regurgitation.  Patient underwent thoracentesis and was diuresed with improvement.  Follow-up echocardiogram May 2021 showed improvement in LV function with ejection fraction 35 to 41%, grade 1 diastolic dysfunction, mild left ventricular hypertrophy and moderate left atrial enlargement.  Carotid Dopplers July 2021 showed 1 to 39% bilateral stenosis. Since last seen  she denies dyspnea, chest pain, palpitations or syncope.  Current Outpatient Medications  Medication Sig Dispense Refill  . b complex vitamins tablet Take 1 tablet by mouth daily.    . bisacodyl (DULCOLAX) 10 MG suppository Place 10 mg rectally daily as needed for moderate constipation (If not relieved by MOM).    . calcium carbonate (CALCIUM 600) 600 MG TABS tablet Take 600 mg by mouth at bedtime.     . carvedilol (COREG) 6.25 MG tablet TAKE 1 TABLET BY MOUTH TWO TIMES A DAY WITH A MEAL 180 tablet 1  . Coenzyme Q10 (COQ10) 200 MG CAPS Take 1 capsule by mouth daily. 30 capsule 0  . Digoxin 62.5 MCG TABS Take 0.0625 mg by mouth daily. 30 tablet 0  . fluticasone (FLONASE) 50 MCG/ACT nasal spray Place 1 spray into both nostrils daily. AS NEEDED FOR ALLERGIC RHINITIS 9.9 mL 0  . furosemide (LASIX) 40 MG tablet Take 1 tablet (40 mg total) by mouth 2 (two) times daily. 60 tablet 0  . Ginkgo Biloba 40 MG CAPS TAKE 3 CAPSULES (120MG ) BY MOUTH ONCE DAILY FOR SUPPLEMENT (DO NOT CRUSH) 90 capsule 0  . Multiple Vitamins-Minerals (MULTIVITAMIN WITH MINERALS) tablet Take 1 tablet by mouth  daily.    . NON FORMULARY ADD MEDPASS 120ML TWO TIMES A DAY    . sacubitril-valsartan (ENTRESTO) 49-51 MG Take 1 tablet by mouth 2 (two) times daily. 180 tablet 0  . Sodium Phosphates (RA SALINE ENEMA RE) Place 1 each rectally daily as needed.    Marland Kitchen spironolactone (ALDACTONE) 25 MG tablet Take 0.5 tablets (12.5 mg total) by mouth daily. 15 tablet 3  . Vitamin D3 (VITAMIN D) 25 MCG tablet Take 1,000 Units by mouth daily. For supplement    . zinc gluconate 50 MG tablet Take 1 tablet (50 mg total) by mouth daily. 30 tablet 0   No current facility-administered medications for this visit.     Past Medical History:  Diagnosis Date  . Arthritis   . Cardiomyopathy ([place])   . Cataract    NS OU  . Congestive heart failure (CHF) ([place])   . Dyspnea   . GERD (gastroesophageal reflux disease)    currently non problematic   . Hypertension   . Hypertensive retinopathy    OD  . 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 Pneumonia    x2 most recent fall of 2017   . PONV (postoperative nausea and vomiting)   . Stroke [facility]) 2008   deficit of neuroplathy in left hand and left foot     Past Surgical History:  Procedure Laterality Date  . ABDOMINAL HYSTERECTOMY    . APPENDECTOMY    . BLADDER SUSPENSION    . CARDIAC CATHETERIZATION  10/2015  . CHOLECYSTECTOMY    .  HEMIARTHROPLASTY HIP Right 10/20/2017  . HIP ARTHROPLASTY Right 10/19/2017   Procedure: ARTHROPLASTY BIPOLAR HIP (HEMIARTHROPLASTY);  Surgeon: Paralee Cancel, MD;  Location: [place];  Service: Orthopedics;  Laterality: Right;  . JOINT REPLACEMENT Left 2013   left hip  . TONSILLECTOMY    . TOTAL KNEE ARTHROPLASTY Left 05/04/2017   Procedure: LEFT TOTAL KNEE ARTHROPLASTY;  Surgeon: Paralee Cancel, MD;  Location[facility];  Service: Orthopedics;  Laterality: Left;  70 mins  . TUBAL LIGATION      Social History   Socioeconomic History  . Marital status: Widowed    Spouse name: Not on file  . Number of children: Not on  file  . Years of education: Not on file  . Highest education level: Not on file  Occupational History  . Not on file  Tobacco Use  . Smoking status: Never Smoker  . Smokeless tobacco: Never Used  Vaping Use  . Vaping Use: Never used  Substance and Sexual Activity  . Alcohol use: No  . Drug use: No  . Sexual activity: Not on file  Other Topics Concern  . Not on file  Social History Narrative  . Not on file   Social Determinants of Health   Financial Resource Strain:   . Difficulty of Paying Living Expenses: Not on file  Food Insecurity:   . Worried About Charity fundraiser in the Last Year: Not on file  . Ran Out of Food in the Last Year: Not on file  Transportation Needs:   . Lack of Transportation (Medical): Not on file  . Lack of Transportation (Non-Medical): Not on file  Physical Activity:   . Days of Exercise per Week: Not on file  . Minutes of Exercise per Session: Not on file  Stress:   . Feeling of Stress : Not on file  Social Connections:   . Frequency of Communication with Friends and Family: Not on file  . Frequency of Social Gatherings with Friends and Family: Not on file  . Attends Religious Services: Not on file  . Active Member of Clubs or Organizations: Not on file  . Attends Archivist Meetings: Not on file  . Marital Status: Not on file  Intimate Partner Violence:   . Fear of Current or Ex-Partner: Not on file  . Emotionally Abused: Not on file  . Physically Abused: Not on file  . Sexually Abused: Not on file    Family History  Problem Relation Age of Onset  . Diabetes Mother   . Ovarian cancer Mother   . Lung cancer Father   . Diabetes Sister   . Stroke Maternal Grandfather     ROS: no fevers or chills, productive cough, hemoptysis, dysphasia, odynophagia, melena, hematochezia, dysuria, hematuria, rash, seizure activity, orthopnea, PND, pedal edema, claudication. Remaining systems are negative.  Physical Exam: Well-developed  well-nourished in no acute distress.  Skin is warm and dry.  HEENT is normal.  Neck is supple.  Chest is clear to auscultation with normal expansion.  Cardiovascular exam is regular rate and rhythm.  Abdominal exam nontender or distended. No masses palpated. Extremities show no edema. neuro grossly intact  A/P  1 chronic systolic congestive heart failure-patient doing well from a volume standpoint.  Continue diuretics.  Check potassium and renal function.  Continue fluid restriction and low-sodium diet.  2 nonischemic cardiomyopathy-LV function has improved on most recent echocardiogram.  Continue Entresto, carvedilol, digoxin and spironolactone.  3 hypertension-patient's blood pressure is controlled today.  Continue present medications and follow.  4 mild coronary artery disease-we discussed a statin today but patient would like to avoid if possible.  Check lipids and if LDL elevated will reconsider.  Kirk Ruths, MD

## 2020-06-06 ENCOUNTER — Ambulatory Visit: Payer: Medicare PPO | Admitting: Cardiology

## 2020-06-06 ENCOUNTER — Other Ambulatory Visit: Payer: Self-pay

## 2020-06-06 ENCOUNTER — Encounter: Payer: Self-pay | Admitting: Cardiology

## 2020-06-06 VITALS — BP 124/76 | HR 59 | Ht 59.0 in | Wt 121.6 lb

## 2020-06-06 DIAGNOSIS — I10 Essential (primary) hypertension: Secondary | ICD-10-CM | POA: Diagnosis not present

## 2020-06-06 DIAGNOSIS — I2510 Atherosclerotic heart disease of native coronary artery without angina pectoris: Secondary | ICD-10-CM

## 2020-06-06 DIAGNOSIS — I428 Other cardiomyopathies: Secondary | ICD-10-CM | POA: Diagnosis not present

## 2020-06-06 DIAGNOSIS — I5022 Chronic systolic (congestive) heart failure: Secondary | ICD-10-CM | POA: Diagnosis not present

## 2020-06-06 LAB — LIPID PANEL
Chol/HDL Ratio: 4.6 ratio — ABNORMAL HIGH (ref 0.0–4.4)
Cholesterol, Total: 223 mg/dL — ABNORMAL HIGH (ref 100–199)
HDL: 49 mg/dL (ref >39)
LDL Chol Calc (NIH): 150 mg/dL — ABNORMAL HIGH (ref 0–99)
Triglycerides: 132 mg/dL (ref 0–149)
VLDL Cholesterol Cal: 24 mg/dL (ref 5–40)

## 2020-06-06 LAB — BASIC METABOLIC PANEL
BUN/Creatinine Ratio: 33 — ABNORMAL HIGH (ref 12–28)
BUN: 41 mg/dL — ABNORMAL HIGH (ref 8–27)
CO2: 28 mmol/L (ref 20–29)
Calcium: 10.3 mg/dL (ref 8.7–10.3)
Chloride: 101 mmol/L (ref 96–106)
Creatinine, Ser: 1.23 mg/dL — ABNORMAL HIGH (ref 0.57–1.00)
GFR calc Af Amer: 47 mL/min/{1.73_m2} — ABNORMAL LOW (ref >59)
GFR calc non Af Amer: 41 mL/min/{1.73_m2} — ABNORMAL LOW (ref >59)
Glucose: 84 mg/dL (ref 65–99)
Potassium: 4.4 mmol/L (ref 3.5–5.2)
Sodium: 145 mmol/L — ABNORMAL HIGH (ref 134–144)

## 2020-06-06 NOTE — Patient Instructions (Signed)
Medication Instructions:  NO CHANGE *If you need a refill on your cardiac medications before your next appointment, please call your pharmacy*   Lab Work: Your physician recommends that you HAVE LAB WORK TODAY If you have labs (blood work) drawn today and your tests are completely normal, you will receive your results only by: . MyChart Message (if you have MyChart) OR . A paper copy in the mail If you have any lab test that is abnormal or we need to change your treatment, we will call you to review the results.   Follow-Up: At [facility], you and your health needs are our priority.  As part of our continuing mission to provide you with exceptional heart care, we have created designated Provider Care Teams.  These Care Teams include your primary Cardiologist (physician) and Advanced Practice Providers (APPs -  Physician Assistants and Nurse Practitioners) who all work together to provide you with the care you need, when you need it.  We recommend signing up for the patient portal called "MyChart".  Sign up information is provided on this After Visit Summary.  MyChart is used to connect with patients for Virtual Visits (Telemedicine).  Patients are able to view lab/test results, encounter notes, upcoming appointments, etc.  Non-urgent messages can be sent to your provider as well.   To learn more about what you can do with MyChart, go to [email or web address].    Your next appointment:   6 month(s)  The format for your next appointment:   In Person  Provider:   You may see Brian Crenshaw, MD or one of the following Advanced Practice Providers on your designated Care Team:    Luke Kilroy, PA-C  Callie Goodrich, PA-C  Jesse Cleaver, FNP    

## 2020-06-07 ENCOUNTER — Telehealth: Payer: Self-pay | Admitting: *Deleted

## 2020-06-07 DIAGNOSIS — I5022 Chronic systolic (congestive) heart failure: Secondary | ICD-10-CM

## 2020-06-07 MED ORDER — FUROSEMIDE 40 MG PO TABS: ORAL_TABLET | ORAL | 0 refills | Status: DC

## 2020-06-07 NOTE — Telephone Encounter (Addendum)
-----   Message from Lelon Perla, MD sent at 06/06/2020  5:06 PM EDT ----- Change Lasix to 40 mg daily with an additional 40 mg as needed for increased weight gain of 2 to 3 pounds or increasing lower extremity edema.  Recheck potassium and renal function in 4 weeks.  If patient willing would add Crestor 20 mg daily and check lipids and liver in 12 weeks. Kirk Ruths   Spoke with pt, Aware of dr Jacalyn Lefevre recommendations. Lab orders mailed to the pt. She prefers not to take crestor.

## 2020-06-26 ENCOUNTER — Telehealth: Payer: Self-pay | Admitting: Cardiology

## 2020-06-26 NOTE — Telephone Encounter (Signed)
DC spironolactone and follow Kirk Ruths

## 2020-06-26 NOTE — Telephone Encounter (Signed)
Pt c/o medication issue:  1. Name of Medication: spironolactone (ALDACTONE) 25 MG tablet  2. How are you currently taking this medication (dosage and times per day)? As directed  3. Are you having a reaction (difficulty breathing--STAT)? yes  4. What is your medication issue? Patient states that this medication is giving her diarrhea and she wants to know if she can come off of it. Please advise.

## 2020-06-26 NOTE — Telephone Encounter (Signed)
Spoke with pt, Aware of dr crenshaw's recommendations.  °

## 2020-06-26 NOTE — Telephone Encounter (Signed)
Spoke with pt, she reports a small amount of diarrhea after starting the medication in January and it continues and she feels it has gotten worse. She feels it is the spironolactone and wants to stop taking it. Will forward for dr [name] review

## 2020-07-09 ENCOUNTER — Encounter (INDEPENDENT_AMBULATORY_CARE_PROVIDER_SITE_OTHER): Payer: Medicare PPO | Admitting: Ophthalmology

## 2020-07-16 DIAGNOSIS — I5022 Chronic systolic (congestive) heart failure: Secondary | ICD-10-CM | POA: Diagnosis not present

## 2020-07-16 LAB — BASIC METABOLIC PANEL
BUN/Creatinine Ratio: 28 (ref 12–28)
BUN: 27 mg/dL (ref 8–27)
CO2: 29 mmol/L (ref 20–29)
Calcium: 10.2 mg/dL (ref 8.7–10.3)
Chloride: 102 mmol/L (ref 96–106)
Creatinine, Ser: 0.98 mg/dL (ref 0.57–1.00)
GFR calc Af Amer: 61 mL/min/{1.73_m2} (ref >59)
GFR calc non Af Amer: 53 mL/min/{1.73_m2} — ABNORMAL LOW (ref >59)
Glucose: 109 mg/dL — ABNORMAL HIGH (ref 65–99)
Potassium: 4.2 mmol/L (ref 3.5–5.2)
Sodium: 143 mmol/L (ref 134–144)

## 2020-07-17 ENCOUNTER — Encounter: Payer: Self-pay | Admitting: *Deleted

## 2020-08-01 DIAGNOSIS — Z03818 Encounter for observation for suspected exposure to other biological agents ruled out: Secondary | ICD-10-CM | POA: Diagnosis not present

## 2020-08-06 ENCOUNTER — Other Ambulatory Visit: Payer: Self-pay | Admitting: Cardiology

## 2020-08-06 DIAGNOSIS — I5022 Chronic systolic (congestive) heart failure: Secondary | ICD-10-CM

## 2020-09-06 ENCOUNTER — Other Ambulatory Visit: Payer: Self-pay | Admitting: Cardiology

## 2020-09-06 DIAGNOSIS — I5022 Chronic systolic (congestive) heart failure: Secondary | ICD-10-CM

## 2020-11-05 ENCOUNTER — Other Ambulatory Visit: Payer: Self-pay | Admitting: Cardiology

## 2020-11-05 DIAGNOSIS — I5022 Chronic systolic (congestive) heart failure: Secondary | ICD-10-CM

## 2020-12-21 NOTE — Progress Notes (Signed)
HPI: Follow-up nonischemic cardiomyopathy, chronic systolic congestive heart failure, history of mild coronary artery disease and hypertension. Previous cardiac catheterization showed mild coronary artery disease with 30% left main. Patient admitted with congestive heart failure January 2021. Ejection fraction on echocardiogram 83%, grade 2 diastolic dysfunction, severe left ventricular enlargement, severe biatrial enlargement, mild to moderate mitral regurgitation. Patient underwent thoracentesis and was diuresed with improvement. Follow-up echocardiogram May 2021 showed improvement in LV function with ejection fraction 35 to 38%, grade 1 diastolic dysfunction, mild left ventricular hypertrophy and moderate left atrial enlargement. Carotid Dopplers July 2021 showed 1 to 39% bilateral stenosis.Since last seenthe patient has dyspnea with more extreme activities but not with routine activities. It is relieved with rest. It is not associated with chest pain. There is no orthopnea, PND or pedal edema. There is no syncope or palpitations. There is no exertional chest pain.   Current Outpatient Medications  Medication Sig Dispense Refill  . b complex vitamins tablet Take 1 tablet by mouth daily.    . bisacodyl (DULCOLAX) 10 MG suppository Place 10 mg rectally daily as needed for moderate constipation (If not relieved by MOM).    . calcium carbonate (OS-CAL) 600 MG TABS tablet Take 600 mg by mouth at bedtime.     . carvedilol (COREG) 6.25 MG tablet TAKE ONE TABLET BY MOUTH TWICE A DAY WITH MEALS 180 tablet 1  . Coenzyme Q10 (COQ10) 200 MG CAPS Take 1 capsule by mouth daily. 30 capsule 0  . Digoxin 62.5 MCG TABS Take 0.0625 mg by mouth daily. 30 tablet 0  . ENTRESTO 49-51 MG TAKE ONE TABLET BY MOUTH TWICE A DAY 180 tablet 0  . fluticasone (FLONASE) 50 MCG/ACT nasal spray Place 1 spray into both nostrils daily. AS NEEDED FOR ALLERGIC RHINITIS 9.9 mL 0  . furosemide (LASIX) 40 MG tablet TAKE ONE  TABLET BY MOUTH DAILY *TAKE ONE ADDITIONAL TABLET DAILY AS NEEDED FOR SWELLING* 60 tablet 10  . Ginkgo Biloba 40 MG CAPS TAKE 3 CAPSULES (120MG ) BY MOUTH ONCE DAILY FOR SUPPLEMENT (DO NOT CRUSH) 90 capsule 0  . Multiple Vitamins-Minerals (MULTIVITAMIN WITH MINERALS) tablet Take 1 tablet by mouth daily.    . NON FORMULARY ADD MEDPASS 120ML TWO TIMES A DAY    . Sodium Phosphates (RA SALINE ENEMA RE) Place 1 each rectally daily as needed.    . Vitamin D3 (VITAMIN D) 25 MCG tablet Take 1,000 Units by mouth daily. For supplement    . zinc gluconate 50 MG tablet Take 1 tablet (50 mg total) by mouth daily. 30 tablet 0   No current facility-administered medications for this visit.     Past Medical History:  Diagnosis Date  . Arthritis   . Cardiomyopathy ([place])   . Cataract    NS OU  . Congestive heart failure (CHF) ([place])   . Dyspnea   . GERD (gastroesophageal reflux disease)    currently non problematic   . Hypertension   . Hypertensive retinopathy    OD  . 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 Pneumonia    x2 most recent fall of 2017   . PONV (postoperative nausea and vomiting)   . Stroke [facility]) 2008   deficit of neuroplathy in left hand and left foot     Past Surgical History:  Procedure Laterality Date  . ABDOMINAL HYSTERECTOMY    . APPENDECTOMY    . BLADDER SUSPENSION    .  CARDIAC CATHETERIZATION  10/2015  . CHOLECYSTECTOMY    . HEMIARTHROPLASTY HIP Right 10/20/2017  . HIP ARTHROPLASTY Right 10/19/2017   Procedure: ARTHROPLASTY BIPOLAR HIP (HEMIARTHROPLASTY);  Surgeon: Paralee Cancel, MD;  Location: [place];  Service: Orthopedics;  Laterality: Right;  . JOINT REPLACEMENT Left 2013   left hip  . TONSILLECTOMY    . TOTAL KNEE ARTHROPLASTY Left 05/04/2017   Procedure: LEFT TOTAL KNEE ARTHROPLASTY;  Surgeon: Paralee Cancel, MD;  Location: [facility];  Service: Orthopedics;  Laterality: Left;  70 mins  . TUBAL LIGATION      Social History   Socioeconomic History   . Marital status: Widowed    Spouse name: Not on file  . Number of children: Not on file  . Years of education: Not on file  . Highest education level: Not on file  Occupational History  . Not on file  Tobacco Use  . Smoking status: Never Smoker  . Smokeless tobacco: Never Used  Vaping Use  . Vaping Use: Never used  Substance and Sexual Activity  . Alcohol use: No  . Drug use: No  . Sexual activity: Not on file  Other Topics Concern  . Not on file  Social History Narrative  . Not on file   Social Determinants of Health   Financial Resource Strain: Not on file  Food Insecurity: Not on file  Transportation Needs: Not on file  Physical Activity: Not on file  Stress: Not on file  Social Connections: Not on file  Intimate Partner Violence: Not on file    Family History  Problem Relation Age of Onset  . Diabetes Mother   . Ovarian cancer Mother   . Lung cancer Father   . Diabetes Sister   . Stroke Maternal Grandfather     ROS: no fevers or chills, productive cough, hemoptysis, dysphasia, odynophagia, melena, hematochezia, dysuria, hematuria, rash, seizure activity, orthopnea, PND, pedal edema, claudication. Remaining systems are negative.  Physical Exam: Well-developed well-nourished in no acute distress.  Skin is warm and dry.  HEENT is normal.  Neck is supple.  Chest is clear to auscultation with normal expansion.  Cardiovascular exam is regular rate and rhythm.  Abdominal exam nontender or distended. No masses palpated. Extremities show no edema. neuro grossly intact  ECG-sinus rhythm at a rate of 61, left anterior fascicular block, left ventricular hypertrophy, poor R wave progression cannot rule out anterior infarct.  Personally reviewed  A/P  1 chronic systolic congestive heart failure-patient is euvolemic today.  Continue diuretic at present dose.  Check potassium and renal function.  2 nonischemic cardiomyopathy-improved on most recent echocardiogram.   Continue present medications including Entresto, carvedilol, digoxin; spironolactone discontinued due to diarrhea.  3 hypertension-blood pressure controlled.  Continue present medications.  4 mild coronary disease-patient has declined statins previously.  Kirk Ruths, MD

## 2020-12-24 ENCOUNTER — Other Ambulatory Visit: Payer: Self-pay

## 2020-12-24 ENCOUNTER — Ambulatory Visit: Payer: Medicare PPO | Admitting: Cardiology

## 2020-12-24 ENCOUNTER — Encounter: Payer: Self-pay | Admitting: Cardiology

## 2020-12-24 VITALS — BP 120/72 | HR 61 | Ht 59.0 in | Wt 130.0 lb

## 2020-12-24 DIAGNOSIS — I5022 Chronic systolic (congestive) heart failure: Secondary | ICD-10-CM | POA: Diagnosis not present

## 2020-12-24 DIAGNOSIS — I2510 Atherosclerotic heart disease of native coronary artery without angina pectoris: Secondary | ICD-10-CM

## 2020-12-24 DIAGNOSIS — I10 Essential (primary) hypertension: Secondary | ICD-10-CM | POA: Diagnosis not present

## 2020-12-24 DIAGNOSIS — I428 Other cardiomyopathies: Secondary | ICD-10-CM

## 2020-12-24 LAB — BASIC METABOLIC PANEL
BUN/Creatinine Ratio: 25 (ref 12–28)
BUN: 29 mg/dL — ABNORMAL HIGH (ref 8–27)
CO2: 27 mmol/L (ref 20–29)
Calcium: 9.9 mg/dL (ref 8.7–10.3)
Chloride: 100 mmol/L (ref 96–106)
Creatinine, Ser: 1.16 mg/dL — ABNORMAL HIGH (ref 0.57–1.00)
Glucose: 93 mg/dL (ref 65–99)
Potassium: 3.9 mmol/L (ref 3.5–5.2)
Sodium: 143 mmol/L (ref 134–144)
eGFR: 46 mL/min/{1.73_m2} — ABNORMAL LOW (ref >59)

## 2020-12-24 NOTE — Patient Instructions (Signed)

## 2020-12-26 ENCOUNTER — Encounter: Payer: Self-pay | Admitting: *Deleted

## 2021-02-04 ENCOUNTER — Other Ambulatory Visit: Payer: Self-pay | Admitting: Cardiology

## 2021-02-04 DIAGNOSIS — I5022 Chronic systolic (congestive) heart failure: Secondary | ICD-10-CM

## 2021-05-05 ENCOUNTER — Other Ambulatory Visit: Payer: Self-pay | Admitting: Cardiology

## 2021-05-05 DIAGNOSIS — I5022 Chronic systolic (congestive) heart failure: Secondary | ICD-10-CM

## 2021-06-17 ENCOUNTER — Ambulatory Visit
Admission: RE | Admit: 2021-06-17 | Discharge: 2021-06-17 | Disposition: A | Discharge status: Home / Self Care | Payer: Medicare PPO | Source: Ambulatory Visit | Attending: Family Medicine | Admitting: Family Medicine

## 2021-06-17 ENCOUNTER — Other Ambulatory Visit: Payer: Self-pay | Admitting: Family Medicine

## 2021-06-17 DIAGNOSIS — M5459 Other low back pain: Secondary | ICD-10-CM

## 2021-06-17 DIAGNOSIS — R7303 Prediabetes: Secondary | ICD-10-CM | POA: Diagnosis not present

## 2021-06-17 DIAGNOSIS — E7800 Pure hypercholesterolemia, unspecified: Secondary | ICD-10-CM | POA: Diagnosis not present

## 2021-06-17 DIAGNOSIS — M545 Low back pain, unspecified: Secondary | ICD-10-CM | POA: Diagnosis not present

## 2021-06-17 DIAGNOSIS — I10 Essential (primary) hypertension: Secondary | ICD-10-CM | POA: Diagnosis not present

## 2021-06-17 DIAGNOSIS — R7309 Other abnormal glucose: Secondary | ICD-10-CM | POA: Diagnosis not present

## 2021-06-26 ENCOUNTER — Ambulatory Visit: Payer: Medicare PPO | Admitting: Cardiology

## 2021-06-27 DIAGNOSIS — M5441 Lumbago with sciatica, right side: Secondary | ICD-10-CM | POA: Diagnosis not present

## 2021-06-27 DIAGNOSIS — R2689 Other abnormalities of gait and mobility: Secondary | ICD-10-CM | POA: Diagnosis not present

## 2021-07-02 DIAGNOSIS — M5441 Lumbago with sciatica, right side: Secondary | ICD-10-CM | POA: Diagnosis not present

## 2021-07-02 DIAGNOSIS — R2689 Other abnormalities of gait and mobility: Secondary | ICD-10-CM | POA: Diagnosis not present

## 2021-07-04 DIAGNOSIS — M5441 Lumbago with sciatica, right side: Secondary | ICD-10-CM | POA: Diagnosis not present

## 2021-07-04 DIAGNOSIS — R2689 Other abnormalities of gait and mobility: Secondary | ICD-10-CM | POA: Diagnosis not present

## 2021-07-07 DIAGNOSIS — R2689 Other abnormalities of gait and mobility: Secondary | ICD-10-CM | POA: Diagnosis not present

## 2021-07-07 DIAGNOSIS — M5441 Lumbago with sciatica, right side: Secondary | ICD-10-CM | POA: Diagnosis not present

## 2021-07-08 DIAGNOSIS — M5441 Lumbago with sciatica, right side: Secondary | ICD-10-CM | POA: Diagnosis not present

## 2021-07-08 DIAGNOSIS — R2689 Other abnormalities of gait and mobility: Secondary | ICD-10-CM | POA: Diagnosis not present

## 2021-07-10 DIAGNOSIS — M5441 Lumbago with sciatica, right side: Secondary | ICD-10-CM | POA: Diagnosis not present

## 2021-07-10 DIAGNOSIS — R2689 Other abnormalities of gait and mobility: Secondary | ICD-10-CM | POA: Diagnosis not present

## 2021-07-15 DIAGNOSIS — R2689 Other abnormalities of gait and mobility: Secondary | ICD-10-CM | POA: Diagnosis not present

## 2021-07-15 DIAGNOSIS — M5441 Lumbago with sciatica, right side: Secondary | ICD-10-CM | POA: Diagnosis not present

## 2021-07-17 DIAGNOSIS — R2689 Other abnormalities of gait and mobility: Secondary | ICD-10-CM | POA: Diagnosis not present

## 2021-07-17 DIAGNOSIS — M5441 Lumbago with sciatica, right side: Secondary | ICD-10-CM | POA: Diagnosis not present

## 2021-07-18 DIAGNOSIS — R2689 Other abnormalities of gait and mobility: Secondary | ICD-10-CM | POA: Diagnosis not present

## 2021-07-18 DIAGNOSIS — M5441 Lumbago with sciatica, right side: Secondary | ICD-10-CM | POA: Diagnosis not present

## 2021-07-21 DIAGNOSIS — R2689 Other abnormalities of gait and mobility: Secondary | ICD-10-CM | POA: Diagnosis not present

## 2021-07-21 DIAGNOSIS — M5441 Lumbago with sciatica, right side: Secondary | ICD-10-CM | POA: Diagnosis not present

## 2021-07-23 DIAGNOSIS — M5441 Lumbago with sciatica, right side: Secondary | ICD-10-CM | POA: Diagnosis not present

## 2021-07-23 DIAGNOSIS — R2689 Other abnormalities of gait and mobility: Secondary | ICD-10-CM | POA: Diagnosis not present

## 2021-07-24 DIAGNOSIS — R2689 Other abnormalities of gait and mobility: Secondary | ICD-10-CM | POA: Diagnosis not present

## 2021-07-24 DIAGNOSIS — M5441 Lumbago with sciatica, right side: Secondary | ICD-10-CM | POA: Diagnosis not present

## 2021-07-28 DIAGNOSIS — R2689 Other abnormalities of gait and mobility: Secondary | ICD-10-CM | POA: Diagnosis not present

## 2021-07-28 DIAGNOSIS — M5441 Lumbago with sciatica, right side: Secondary | ICD-10-CM | POA: Diagnosis not present

## 2021-08-04 ENCOUNTER — Other Ambulatory Visit: Payer: Self-pay | Admitting: Cardiology

## 2021-08-04 DIAGNOSIS — M5441 Lumbago with sciatica, right side: Secondary | ICD-10-CM | POA: Diagnosis not present

## 2021-08-04 DIAGNOSIS — R2689 Other abnormalities of gait and mobility: Secondary | ICD-10-CM | POA: Diagnosis not present

## 2021-08-04 DIAGNOSIS — I5022 Chronic systolic (congestive) heart failure: Secondary | ICD-10-CM

## 2021-08-06 DIAGNOSIS — M5441 Lumbago with sciatica, right side: Secondary | ICD-10-CM | POA: Diagnosis not present

## 2021-08-06 DIAGNOSIS — R2689 Other abnormalities of gait and mobility: Secondary | ICD-10-CM | POA: Diagnosis not present

## 2021-08-08 ENCOUNTER — Emergency Department (HOSPITAL_BASED_OUTPATIENT_CLINIC_OR_DEPARTMENT_OTHER)
Admission: EM | Admit: 2021-08-08 | Discharge: 2021-08-08 | Disposition: A | Discharge status: Home / Self Care | Payer: Medicare PPO | Attending: Emergency Medicine | Admitting: Emergency Medicine

## 2021-08-08 ENCOUNTER — Other Ambulatory Visit: Payer: Self-pay

## 2021-08-08 ENCOUNTER — Emergency Department (HOSPITAL_BASED_OUTPATIENT_CLINIC_OR_DEPARTMENT_OTHER): Payer: Medicare PPO

## 2021-08-08 DIAGNOSIS — Z96643 Presence of artificial hip joint, bilateral: Secondary | ICD-10-CM | POA: Diagnosis not present

## 2021-08-08 DIAGNOSIS — J90 Pleural effusion, not elsewhere classified: Secondary | ICD-10-CM | POA: Diagnosis not present

## 2021-08-08 DIAGNOSIS — M549 Dorsalgia, unspecified: Secondary | ICD-10-CM | POA: Diagnosis not present

## 2021-08-08 DIAGNOSIS — M5441 Lumbago with sciatica, right side: Secondary | ICD-10-CM | POA: Diagnosis not present

## 2021-08-08 DIAGNOSIS — J9811 Atelectasis: Secondary | ICD-10-CM | POA: Diagnosis not present

## 2021-08-08 DIAGNOSIS — I700 Atherosclerosis of aorta: Secondary | ICD-10-CM | POA: Diagnosis not present

## 2021-08-08 DIAGNOSIS — Z79899 Other long term (current) drug therapy: Secondary | ICD-10-CM | POA: Insufficient documentation

## 2021-08-08 DIAGNOSIS — Z96652 Presence of left artificial knee joint: Secondary | ICD-10-CM | POA: Diagnosis not present

## 2021-08-08 DIAGNOSIS — R2689 Other abnormalities of gait and mobility: Secondary | ICD-10-CM | POA: Diagnosis not present

## 2021-08-08 DIAGNOSIS — I5022 Chronic systolic (congestive) heart failure: Secondary | ICD-10-CM | POA: Diagnosis not present

## 2021-08-08 DIAGNOSIS — R059 Cough, unspecified: Secondary | ICD-10-CM | POA: Diagnosis not present

## 2021-08-08 DIAGNOSIS — I110 Hypertensive heart disease with heart failure: Secondary | ICD-10-CM | POA: Insufficient documentation

## 2021-08-08 DIAGNOSIS — U071 COVID-19: Secondary | ICD-10-CM | POA: Insufficient documentation

## 2021-08-08 LAB — RESP PANEL BY RT-PCR (FLU A&B, COVID) ARPGX2
Influenza A by PCR: NEGATIVE
Influenza B by PCR: NEGATIVE
SARS Coronavirus 2 by RT PCR: POSITIVE — AB

## 2021-08-08 NOTE — ED Notes (Signed)
X-ray at bedside

## 2021-08-08 NOTE — ED Provider Notes (Signed)
[place] EMERGENCY DEPT Provider Note   CSN: [id number] Arrival date & time: 08/08/21  1427     History Chief Complaint  Patient presents with   Back Pain   Cough    Barbara Kelley is a 87 y.o. female.  Patient is here chief complaint of cough and congestion ongoing for about 2 weeks now.  She also has persistent lower back pain ongoing for the past 5 weeks after lifting a bag 5 weeks ago.  She denies any chest pain or vomiting or diarrhea.  She presents to ER because her cough is not resolved.  Denies any recent travel.      Past Medical History:  Diagnosis Date   Arthritis    Cardiomyopathy ([place])    Cataract    NS OU   Congestive heart failure (CHF) ([facility])    Dyspnea    GERD (gastroesophageal reflux disease)    currently non problematic    Hypertension    Hypertensive retinopathy    OD   Neuropathy    related to stroke ; deficit of CVA in 2008   Pneumonia    x2 most recent fall of 2017    PONV (postoperative nausea and vomiting)    Stroke ([place]) 2008   deficit of neuroplathy in left hand and left foot     Patient Active Problem List   Diagnosis Date Noted   S/P thoracentesis    Shortness of breath    Acute on chronic heart failure ([place]) 10/09/2019   Mitral regurgitation 08/11/2018   Mild CAD 08/11/2018   Closed fracture of right hip ([facility])    Closed subcapital fracture of neck of femur, right, initial encounter ([place]) 10/18/2017   Hypertensive heart disease without CHF 10/18/2017   NICM (nonischemic cardiomyopathy) ([place]) 03/28/7627   Chronic systolic heart failure ([place]) 10/18/2017   HLD (hyperlipidemia) 10/18/2017   Near syncope 10/18/2017   S/P left TKA 05/04/2017   S/P total knee replacement 05/04/2017   History of CVA (cerebrovascular accident) 10/05/2006    Past Surgical History:  Procedure Laterality Date   ABDOMINAL HYSTERECTOMY     APPENDECTOMY     BLADDER SUSPENSION     CARDIAC CATHETERIZATION  10/2015   CHOLECYSTECTOMY      HEMIARTHROPLASTY HIP Right 10/20/2017   HIP ARTHROPLASTY Right 10/19/2017   Procedure: ARTHROPLASTY BIPOLAR HIP (HEMIARTHROPLASTY);  Surgeon: Paralee Cancel, MD;  Location: [place];  Service: Orthopedics;  Laterality: Right;   JOINT REPLACEMENT Left 2013   left hip   TONSILLECTOMY     TOTAL KNEE ARTHROPLASTY Left 05/04/2017   Procedure: LEFT TOTAL KNEE ARTHROPLASTY;  Surgeon: Paralee Cancel, MD;  [facility];  Service: Orthopedics;  Laterality: Left;  70 mins   TUBAL LIGATION       OB History   No obstetric history on file.     Family History  Problem Relation Age of Onset   Diabetes Mother    Ovarian cancer Mother    Lung cancer Father    Diabetes Sister    Stroke Maternal Grandfather     Social History   Tobacco Use   Smoking status: Never   Smokeless tobacco: Never  Vaping Use   Vaping Use: Never used  Substance Use Topics   Alcohol use: No   Drug use: No    Home Medications Prior to Admission medications   Medication Sig Start Date End Date Taking? Authorizing Provider  b complex vitamins tablet Take 1 tablet by mouth daily.  [provider]  bisacodyl (DULCOLAX) 10 MG suppository Place 10 mg rectally daily as needed for moderate constipation (If not relieved by MOM).    [provider]  calcium carbonate (OS-CAL) 600 MG TABS tablet Take 600 mg by mouth at bedtime.     [provider]  carvedilol (COREG) 6.25 MG tablet TAKE ONE TABLET BY MOUTH TWICE A DAY WITH MEALS 05/05/21   Lelon Perla, MD  Coenzyme Q10 (COQ10) 200 MG CAPS Take 1 capsule by mouth daily. 11/01/19   Medina-Vargas, Monina C, NP  Digoxin 62.5 MCG TABS Take 0.0625 mg by mouth daily. 11/01/19   Medina-Vargas, Monina C, NP  ENTRESTO 49-51 MG TAKE ONE TABLET BY MOUTH TWICE A DAY 02/04/21   Lelon Perla, MD  fluticasone (FLONASE) 50 MCG/ACT nasal spray Place 1 spray into both nostrils daily. AS NEEDED FOR ALLERGIC RHINITIS 11/01/19   Medina-Vargas, Monina C, NP  furosemide  (LASIX) 40 MG tablet TAKE ONE TABLET BY MOUTH DAILY *TAKE ONE ADDITIONAL TABLET DAILY AS NEEDED FOR SWELLING* 08/04/21   Crenshaw, [name], MD  Ginkgo Biloba 40 MG CAPS TAKE 3 CAPSULES (120MG ) BY MOUTH ONCE DAILY FOR SUPPLEMENT (DO NOT CRUSH) 11/01/19   Medina-Vargas, Monina C, NP  Multiple Vitamins-Minerals (MULTIVITAMIN WITH MINERALS) tablet Take 1 tablet by mouth daily.    [provider]  Baruch Gouty ADD MEDPASS 120ML TWO TIMES A DAY    [provider]  Sodium Phosphates (RA SALINE ENEMA RE) Place 1 each rectally daily as needed.    [provider]  Vitamin D3 (VITAMIN D) 25 MCG tablet Take 1,000 Units by mouth daily. For supplement    [provider]  zinc gluconate 50 MG tablet Take 1 tablet (50 mg total) by mouth daily. 11/01/19   Medina-Vargas, Monina C, NP    Allergies    Doxycycline, Macrobid [nitrofurantoin], Valsartan, Lisinopril, Calcium channel blockers, and Penicillins  Review of Systems   Review of Systems  Constitutional:  Negative for fever.  HENT:  Negative for ear pain.   Eyes:  Negative for pain.  Respiratory:  Positive for cough.   Cardiovascular:  Negative for chest pain.  Gastrointestinal:  Negative for abdominal pain.  Genitourinary:  Negative for flank pain.  Musculoskeletal:  Negative for back pain.  Skin:  Negative for rash.  Neurological:  Negative for headaches.   Physical Exam Updated Vital Signs BP (!) 129/92 (BP Location: Left Arm)   Pulse 76   Temp 99.2 F (37.3 C) (Oral)   Resp 19   Ht 4\' 11"  (1.499 m)   Wt 55.8 kg   SpO2 96%   BMI 24.84 kg/m   Physical Exam Constitutional:      General: She is not in acute distress.    Appearance: Normal appearance.  HENT:     Head: Normocephalic.     Nose: Nose normal.  Eyes:     Extraocular Movements: Extraocular movements intact.  Cardiovascular:     Rate and Rhythm: Normal rate.  Pulmonary:     Effort: Pulmonary effort is normal.  Musculoskeletal:         General: Normal range of motion.     Cervical back: Normal range of motion.  Neurological:     General: No focal deficit present.     Mental Status: She is alert. Mental status is at baseline.    ED Results / Procedures / Treatments   Labs (all labs ordered are listed, but only abnormal results are displayed)  Labs Reviewed  RESP PANEL BY RT-PCR (FLU A&B, COVID) ARPGX2 - Abnormal; Notable for the following components:      Result Value   SARS Coronavirus 2 by RT PCR POSITIVE (*)    All other components within normal limits    EKG None  Radiology DG Chest Portable 1 View  Result Date: 08/08/2021 CLINICAL DATA:  Cough and congestion. EXAM: PORTABLE CHEST 1 VIEW COMPARISON:  October 12, 2019 FINDINGS: Mild bibasilar atelectasis is seen. Very mild blunting of the bilateral costophrenic angles is noted. No pneumothorax is identified. There is mild to moderate severity enlargement of the cardiac silhouette which is stable. Calcification of the aortic arch and descending thoracic aorta is seen. Degenerative changes are noted throughout the thoracic spine. IMPRESSION: 1. Stable cardiomegaly with bibasilar atelectasis. 2. Very small bilateral pleural effusions. Electronically Signed   By: Virgina Norfolk M.D.   On: 08/08/2021 18:25    Procedures Procedures   Medications Ordered in ED Medications - No data to display  ED Course  I have reviewed the triage vital signs and the nursing notes.  Pertinent labs & imaging results that were available during my care of the patient were reviewed by me and considered in my medical decision making (see chart for details).    MDM Rules/Calculators/A&P                           Patient is positive for COVID.  Chest x-ray otherwise shows no acute findings.  Vital signs within normal limits oxygenation 96% on room air which is appropriate.  Recommending rest and fluid hydration at home.  Advised return for difficulty breathing or worsening  symptoms.  Advised her to follow-up with her primary care doctor 3 to 4 days.  Final Clinical Impression(s) / ED Diagnoses Final diagnoses:  COVID-19 virus infection    Rx / DC Orders ED Discharge Orders     None        Luna Fuse, MD 08/08/21 1843

## 2021-08-08 NOTE — Discharge Instructions (Addendum)
Call your primary care doctor or specialist as discussed in the next 2-3 days.   Return immediately back to the ER if:  Your symptoms worsen within the next 12-24 hours. You develop new symptoms such as new fevers, persistent vomiting, new pain, shortness of breath, or new weakness or numbness, or if you have any other concerns.  

## 2021-08-08 NOTE — ED Triage Notes (Signed)
Pt BIBA from home-  Pt reports back pain x5 weeks after lifting heavy bag. Taken 2 Aleve and muscle relaxer today with minimal relief.   Pt also c/o congestion/cough worse at night, x2-3 weeks, worsening over time.

## 2021-08-15 DIAGNOSIS — R059 Cough, unspecified: Secondary | ICD-10-CM | POA: Diagnosis not present

## 2021-08-15 DIAGNOSIS — U071 COVID-19: Secondary | ICD-10-CM | POA: Diagnosis not present

## 2021-08-15 DIAGNOSIS — M545 Low back pain, unspecified: Secondary | ICD-10-CM | POA: Diagnosis not present

## 2021-08-19 ENCOUNTER — Ambulatory Visit: Payer: Medicare PPO | Admitting: Physician Assistant

## 2021-08-19 DIAGNOSIS — R2689 Other abnormalities of gait and mobility: Secondary | ICD-10-CM | POA: Diagnosis not present

## 2021-08-19 DIAGNOSIS — M5441 Lumbago with sciatica, right side: Secondary | ICD-10-CM | POA: Diagnosis not present

## 2021-08-20 DIAGNOSIS — R2689 Other abnormalities of gait and mobility: Secondary | ICD-10-CM | POA: Diagnosis not present

## 2021-08-20 DIAGNOSIS — M5441 Lumbago with sciatica, right side: Secondary | ICD-10-CM | POA: Diagnosis not present

## 2021-08-22 DIAGNOSIS — M5441 Lumbago with sciatica, right side: Secondary | ICD-10-CM | POA: Diagnosis not present

## 2021-08-22 DIAGNOSIS — R2689 Other abnormalities of gait and mobility: Secondary | ICD-10-CM | POA: Diagnosis not present

## 2021-08-25 DIAGNOSIS — M5441 Lumbago with sciatica, right side: Secondary | ICD-10-CM | POA: Diagnosis not present

## 2021-08-25 DIAGNOSIS — R2689 Other abnormalities of gait and mobility: Secondary | ICD-10-CM | POA: Diagnosis not present

## 2021-08-25 NOTE — Progress Notes (Signed)
Cardiology Clinic Note   Patient Name: Barbara Kelley Date of Encounter: 08/26/2021  Primary Care Provider:  [name], MD (Inactive) Primary Cardiologist:  Barbara Ruths, MD  Patient Profile    Barbara Kelley 87 year old female presents today for follow-up of her nonischemic cardiomyopathy and chronic systolic CHF.  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 ; deficit of CVA in 2008   Pneumonia    x2 most recent fall of 2017    PONV (postoperative nausea and vomiting)    Stroke ([place]) 2008   deficit of neuroplathy in left hand and left foot    Past Surgical History:  Procedure Laterality Date   ABDOMINAL HYSTERECTOMY     APPENDECTOMY     BLADDER SUSPENSION     CARDIAC CATHETERIZATION  10/2015   CHOLECYSTECTOMY     HEMIARTHROPLASTY HIP Right 10/20/2017   HIP ARTHROPLASTY Right 10/19/2017   Procedure: ARTHROPLASTY BIPOLAR HIP (HEMIARTHROPLASTY);  Surgeon: Paralee Cancel, MD;  Location: [facility];  Service: Orthopedics;  Laterality: Right;   JOINT REPLACEMENT Left 2013   left hip   TONSILLECTOMY     TOTAL KNEE ARTHROPLASTY Left 05/04/2017   Procedure: LEFT TOTAL KNEE ARTHROPLASTY;  Surgeon: Paralee Cancel, MD;  Location: [facility];  Service: Orthopedics;  Laterality: Left;  70 mins   TUBAL LIGATION      Allergies  Allergies  Allergen Reactions   Doxycycline Nausea Only    Vertigo   Macrobid [Nitrofurantoin] Other (See Comments)    Nerve pain   Valsartan Other (See Comments)    Blood pressure rise   Lisinopril Cough   Calcium Channel Blockers Palpitations    Blow pressure rise / tachycardia    Penicillins Rash    Has patient had a PCN reaction causing immediate rash,  facial/tongue/throat swelling, SOB or lightheadedness with hypotension: Yes Has patient had a PCN reaction causing severe rash involving mucus membranes or skin necrosis: Yes Has patient had a PCN reaction that required hospitalization: Unk Has patient had a PCN reaction occurring within the last 10 years: No If all of the above answers are "NO", then may proceed with Cephalosporin use.     History of Present Illness    [name] has a past medical history of chronic systolic heart failure, hypertensive heart disease/NICM, mitral valve regurgitation, and mild CAD.  She was recently admitted to to the hospital on 10/08/2019-10/16/2019 for CHF exacerbation.   She is followed by Dr. Bettina Gavia.  She is undergone cardiac catheterization which is not shown in epic.  Her cardiac catheterization revealed mild coronary artery disease 30% left main, and a EF of 40%.  Her echocardiogram from 10/2017 showed an EF of 84-13%, grade 2 diastolic dysfunction, trivial TR, mild MR, and moderate dilation of the LA/RA.  Follow-up 2D echocardiogram showed EF of 50%, dilated LV, moderate LVH, severe LAE, moderate RAE, moderate MR, and mild TR.   She was seen by Dr. Bettina Gavia 04/05/2019 for decompensated heart failure.  Over the previous couple of months she has noticed increasing lower extremity edema and orthopnea despite increasing Lasix.   She then  presented to the ER on 1/4 with worsening symptoms of CHF.  Her BNP was greater than 4500 at the time.  Her chest x-ray showed moderate right pleural effusion with adjacent opacities possible atelectasis or infiltrates.  Her EKG at that time showed normal sinus rhythm 90 bpm PVC and nonspecific T wave changes inferior leads with extreme axis deviation.   She was diuresed 8.9 L and her weight went from 127 pounds to 95 pounds.  Her digoxin was reduced to 0.0658 mg daily, her Coreg Entresto and spironolactone were continued.  With a plan to titrate medications up as outpatient.  At the  day of discharge (10/16/2019) her Lasix was reduced to 40 mg twice daily due to hypotension.   She presented to the clinic 11/21/19 for follow-up and stated she felt much better.  She had been doing to group exercise 5 days/week for about 45 minutes at a time.  Her exercises included balance walking and strength exercises.  She stated that she was following a low-sodium diet.   She was weighing daily at the same time after voiding.  When asked about her Delene Loll she stated that she was still taking the lower dose 24-26 mg.  I  increased her Delene Loll today to 84-51.   She presented for follow-up 12/08/2019 and stated she continued to be physically active and watch salt intake with her diet.  She was tolerating Entresto 49-51 well.  She stated she felt better than she had in quite some time.  She had gained about 1 pound since we have seen her last.  However, she stated that she felt like she had an appetite now and was able to enjoy food again.  Her dry weight was around her current weight at 100 to 102 pounds.  I instructed her to call the office if she gained more than 3 pounds overnight or 5 pounds in a week.  I  ordered a BMP  and had her follow-up with Dr. [name] in 3 months.  She followed up with Dr. [name] 12/24/2020.  Her follow-up echocardiogram 5/21 showed improved LVEF of 35-40%, G1 DD, mild LVH, moderate left atrial enlargement.  Her carotid Doppler 7/21 showed 1-39% bilateral stenosis.  She reported dyspnea with more intense activities but not with routine activities.  Her dyspnea was relieved with rest.  She denied exertional type chest pain.  She had no orthopnea PND or lower extremity swelling.  She denied syncope and palpitations.  She presents to the clinic today for follow-up evaluation states she is suffering from back pain due to pulled muscles in her back and sciatica.  She is doing physical therapy.  Her physical therapist presented to her room and have also asked her to sit in different  positions to help with her sciatica.  She continues to have well-controlled blood pressure in the 120s over 70s.  We reviewed her previous echocardiogram and Doppler studies.  I will continue her current medication regimen plan follow-up for 6 months.   She denies chest pain, shortness of breath, lower extremity edema, fatigue, palpitations, melena, hematuria, hemoptysis, diaphoresis, weakness, presyncope, syncope, orthopnea, and PND.   Home Medications    Prior to Admission medications   Medication Sig Start Date End Date Taking? Authorizing Provider  b complex vitamins tablet Take 1 tablet by mouth daily.    [provider]  bisacodyl (DULCOLAX) 10 MG suppository Place 10 mg rectally daily as needed for moderate constipation (If not relieved by MOM).    [provider]  calcium carbonate (CALCIUM 600) 600 MG TABS tablet Take 600 mg by mouth at bedtime.     [provider]  carvedilol (COREG) 6.25 MG tablet Take 1 tablet (6.25 mg total) by mouth 2 (two) times daily with a meal. 11/01/19   Medina-Vargas, Monina C, NP  Coenzyme Q10 (COQ10) 200 MG CAPS Take 1 capsule by mouth daily. 11/01/19   Medina-Vargas, Monina C, NP  Digoxin 62.5 MCG TABS Take 0.0625 mg by mouth daily. 11/01/19   Medina-Vargas, Monina C, NP  fluticasone (FLONASE) 50 MCG/ACT nasal spray Place 1 spray into both nostrils daily. AS NEEDED FOR ALLERGIC RHINITIS 11/01/19   Medina-Vargas, Monina C, NP  furosemide (LASIX) 40 MG tablet Take 1 tablet (40 mg total) by mouth 2 (two) times daily. 11/01/19   Medina-Vargas, Monina C, NP  Ginkgo Biloba 40 MG CAPS TAKE 3 CAPSULES (120MG ) BY MOUTH ONCE DAILY FOR SUPPLEMENT (DO NOT CRUSH) 11/01/19   Medina-Vargas, Monina C, NP  Multiple Vitamins-Minerals (MULTIVITAMIN WITH MINERALS) tablet Take 1 tablet by mouth daily.    [provider]  Baruch Gouty ADD MEDPASS 120ML TWO TIMES A DAY    [provider]  sacubitril-valsartan (ENTRESTO) 49-51 MG Take 1  tablet by mouth 2 (two) times daily. 11/01/19   Medina-Vargas, Monina C, NP  Sodium Phosphates (RA SALINE ENEMA RE) Place 1 each rectally daily as needed.    [provider]  spironolactone (ALDACTONE) 25 MG tablet Take 0.5 tablets (12.5 mg total) by mouth daily. 12/01/19   Lelon Perla, MD  Vitamin D3 (VITAMIN D) 25 MCG tablet Take 1,000 Units by mouth daily. For supplement    [provider]  zinc gluconate 50 MG tablet Take 1 tablet (50 mg total) by mouth daily. 11/01/19   Medina-Vargas, Senaida Lange, NP    Family History    Family History  Problem Relation Age of Onset   Diabetes Mother    Ovarian cancer Mother    Lung cancer Father    Diabetes Sister    Stroke Maternal Grandfather    She indicated that the status of her mother is unknown. She indicated that the status of her father is unknown. She indicated that the status of her sister is unknown. She indicated that the status of her maternal grandfather is unknown.  Social History    Social History   Socioeconomic History   Marital status: Widowed    Spouse name: Not on file   Number of children: Not on file   Years of education: Not on file   Highest education level: Not on file  Occupational History   Not on file  Tobacco Use   Smoking status: Never   Smokeless tobacco: Never  Vaping Use   Vaping Use: Never used  Substance and Sexual Activity   Alcohol use: No   Drug use: No   Sexual activity: Not on file  Other Topics Concern   Not on file  Social History Narrative   Not on file   Social Determinants of Health   Financial Resource Strain: Not on file  Food Insecurity: Not on file  Transportation Needs: Not on file  Physical Activity: Not on file  Stress: Not on file  Social Connections: Not on file  Intimate Partner Violence: Not on file     Review of Systems    General:  No chills, fever, night sweats or weight changes.  Cardiovascular:  No chest pain, dyspnea on exertion, edema,  orthopnea, palpitations,  paroxysmal nocturnal dyspnea. Dermatological: No rash, lesions/masses Respiratory: No cough, dyspnea Urologic: No hematuria, dysuria Abdominal:   No nausea, vomiting, diarrhea, bright red blood per rectum, melena, or hematemesis Neurologic:  No visual changes, wkns, changes in mental status. All other systems reviewed and are otherwise negative except as noted above.  Physical Exam    VS:  BP 126/74 (BP Location: Left Arm, Patient Position: Sitting, Cuff Size: Normal)   Pulse 100   Ht 4\' 11"  (1.499 m)   Wt 121 lb (54.9 kg)   BMI 24.44 kg/m  , BMI Body mass index is 24.44 kg/m. GEN: Well nourished, well developed, in no acute distress. HEENT: normal. Neck: Supple, no JVD, carotid bruits, or masses. Cardiac: RRR, no murmurs, rubs, or gallops. No clubbing, cyanosis, edema.  Radials/DP/PT 2+ and equal bilaterally.  Respiratory:  Respirations regular and unlabored, clear to auscultation bilaterally. GI: Soft, nontender, nondistended, BS + x 4. MS: no deformity or atrophy. Skin: warm and dry, no rash. Neuro:  Strength and sensation are intact. Psych: Normal affect.  Accessory Clinical Findings    ECG personally reviewed by me today-sinus rhythm with premature atrial complexes left axis deviation anterior lateral infarct undetermined age 34 bpm.  EKG 10/10/2019 Sinus rhythm with occasional PVCs right axis deviation anterior infarct undetermined age 50 bpm   Echocardiogram 10/09/2019 IMPRESSIONS     1. Left ventricular ejection fraction, by visual estimation, is 10 %%.  The left ventricle has severely decreased function. There is no left  ventricular hypertrophy.   2. Left ventricular diastolic parameters are consistent with Grade II  diastolic dysfunction (pseudonormalization).   3. Severely dilated left ventricular internal cavity size.   4. The left ventricle demonstrates global hypokinesis.   5. Global right ventricle has low normal systolic  function.The right  ventricular size is normal. No increase in right ventricular wall  thickness.   6. Left atrial size was severely dilated.   7. Right atrial size was severely dilated.   8. The mitral valve is normal in structure. Mild to moderate mitral valve  regurgitation.   9. The tricuspid valve is grossly normal.  10. The aortic valve is tricuspid. Aortic valve regurgitation is not  visualized. No evidence of aortic valve sclerosis or stenosis.  11. The pulmonic valve was normal in structure. Pulmonic valve  regurgitation is trivial.  12. Moderately elevated pulmonary artery systolic pressure.  13. The atrial septum is grossly normal.  Assessment & Plan   1.  Chronic systolic heart failure/nonischemic cardiomyopathy-no increased DOE .  Echocardiogram 5/21 showed LVEF 35-40%, G1 DD, mild left ventricular hypertrophy, moderate left atrial enlargement.  Previous echocardiogram 10/09/2019 showed LVEF of 37%, grade 2 diastolic dysfunction, severely dilated LV, severe bilateral enlargement, mild to moderate MR and moderate pulmonary hypertension.   Weight today 121 lbs. limited in her physical activity at this time due to back pain from bilateral pulled muscles.  She is also noticing some sciatic type pain.  She is doing physical therapy at this time. Continue digoxin , carvedilol , Entresto 49-51 , furosemide  Daily weights-call office for weight increase of 3 pounds overnight or 5 pounds in 1 week.  Essential hypertension-BP today 126/74.  Well-controlled at home. Continue  carvedilol ,Entresto 49-51 twice daily Heart healthy low-sodium diet no salty 6 given Increase physical activity as tolerated Continue blood pressure log    Coronary artery disease-no episodes of arm neck back or chest discomfort.  Previous cardiac catheterization showed mild coronary artery disease with 30%  left main.  She previously wished to defer statin therapy. No plans for further ischemic evaluation Heart  healthy low-sodium high-fiber diet Increase physical activity as tolerated   Disposition: Follow-up with Dr. [name] in 6 months.   Jossie Ng. [facility] [address] Office [phone] Fax [phone]

## 2021-08-26 ENCOUNTER — Ambulatory Visit: Payer: Medicare PPO | Admitting: General Practice

## 2021-08-26 ENCOUNTER — Encounter: Payer: Self-pay | Admitting: General Practice

## 2021-08-26 ENCOUNTER — Other Ambulatory Visit: Payer: Self-pay

## 2021-08-26 VITALS — BP 126/74 | HR 100 | Ht 59.0 in | Wt 121.0 lb

## 2021-08-26 DIAGNOSIS — I2510 Atherosclerotic heart disease of native coronary artery without angina pectoris: Secondary | ICD-10-CM | POA: Diagnosis not present

## 2021-08-26 DIAGNOSIS — I5022 Chronic systolic (congestive) heart failure: Secondary | ICD-10-CM

## 2021-08-26 DIAGNOSIS — I10 Essential (primary) hypertension: Secondary | ICD-10-CM

## 2021-08-26 DIAGNOSIS — M5441 Lumbago with sciatica, right side: Secondary | ICD-10-CM | POA: Diagnosis not present

## 2021-08-26 DIAGNOSIS — R2689 Other abnormalities of gait and mobility: Secondary | ICD-10-CM | POA: Diagnosis not present

## 2021-08-26 NOTE — Patient Instructions (Signed)
Medication Instructions:  Your Physician recommend you continue on your current medication as directed.    *If you need a refill on your cardiac medications before your next appointment, please call your pharmacy*   Lab Work: None   Testing/Procedures: None    Follow-Up: At [facility], you and your health needs are our priority.  As part of our continuing mission to provide you with exceptional heart care, we have created designated Provider Care Teams.  These Care Teams include your primary Cardiologist (physician) and Advanced Practice Providers (APPs -  Physician Assistants and Nurse Practitioners) who all work together to provide you with the care you need, when you need it.  We recommend signing up for the patient portal called "MyChart".  Sign up information is provided on this After Visit Summary.  MyChart is used to connect with patients for Virtual Visits (Telemedicine).  Patients are able to view lab/test results, encounter notes, upcoming appointments, etc.  Non-urgent messages can be sent to your provider as well.   To learn more about what you can do with MyChart, go to [email or web address].    Your next appointment:   6 month(s)  The format for your next appointment:   In Person  Provider:   Kirk Ruths, MD

## 2021-08-27 DIAGNOSIS — R2689 Other abnormalities of gait and mobility: Secondary | ICD-10-CM | POA: Diagnosis not present

## 2021-08-27 DIAGNOSIS — M5441 Lumbago with sciatica, right side: Secondary | ICD-10-CM | POA: Diagnosis not present

## 2021-09-01 DIAGNOSIS — M5441 Lumbago with sciatica, right side: Secondary | ICD-10-CM | POA: Diagnosis not present

## 2021-09-01 DIAGNOSIS — R2689 Other abnormalities of gait and mobility: Secondary | ICD-10-CM | POA: Diagnosis not present

## 2021-09-03 DIAGNOSIS — M5441 Lumbago with sciatica, right side: Secondary | ICD-10-CM | POA: Diagnosis not present

## 2021-09-03 DIAGNOSIS — R2689 Other abnormalities of gait and mobility: Secondary | ICD-10-CM | POA: Diagnosis not present

## 2021-09-05 DIAGNOSIS — R2689 Other abnormalities of gait and mobility: Secondary | ICD-10-CM | POA: Diagnosis not present

## 2021-09-05 DIAGNOSIS — M5441 Lumbago with sciatica, right side: Secondary | ICD-10-CM | POA: Diagnosis not present

## 2021-09-09 DIAGNOSIS — R2689 Other abnormalities of gait and mobility: Secondary | ICD-10-CM | POA: Diagnosis not present

## 2021-09-09 DIAGNOSIS — M5441 Lumbago with sciatica, right side: Secondary | ICD-10-CM | POA: Diagnosis not present

## 2021-09-10 DIAGNOSIS — M5441 Lumbago with sciatica, right side: Secondary | ICD-10-CM | POA: Diagnosis not present

## 2021-09-10 DIAGNOSIS — R2689 Other abnormalities of gait and mobility: Secondary | ICD-10-CM | POA: Diagnosis not present

## 2021-09-12 DIAGNOSIS — R2689 Other abnormalities of gait and mobility: Secondary | ICD-10-CM | POA: Diagnosis not present

## 2021-09-12 DIAGNOSIS — M5441 Lumbago with sciatica, right side: Secondary | ICD-10-CM | POA: Diagnosis not present

## 2021-09-16 DIAGNOSIS — M5451 Vertebrogenic low back pain: Secondary | ICD-10-CM | POA: Diagnosis not present

## 2021-09-16 DIAGNOSIS — M5459 Other low back pain: Secondary | ICD-10-CM | POA: Diagnosis not present

## 2021-09-16 DIAGNOSIS — M546 Pain in thoracic spine: Secondary | ICD-10-CM | POA: Diagnosis not present

## 2021-09-16 DIAGNOSIS — M4854XA Collapsed vertebra, not elsewhere classified, thoracic region, initial encounter for fracture: Secondary | ICD-10-CM | POA: Diagnosis not present

## 2021-09-17 ENCOUNTER — Telehealth: Payer: Self-pay

## 2021-09-17 DIAGNOSIS — M5441 Lumbago with sciatica, right side: Secondary | ICD-10-CM | POA: Diagnosis not present

## 2021-09-17 DIAGNOSIS — R2689 Other abnormalities of gait and mobility: Secondary | ICD-10-CM | POA: Diagnosis not present

## 2021-09-17 NOTE — Telephone Encounter (Signed)
° ° °  Patient Name: Barbara Kelley  DOB: 29-Dec-1933 MRN: [id number]  Primary Cardiologist: Kirk Ruths, MD  Chart reviewed as part of pre-operative protocol coverage. Given past medical history and time since last visit, based on ACC/AHA guidelines, Barbara Kelley would be at acceptable risk for the planned procedure without further cardiovascular testing.   The patient was advised that if she develops new symptoms prior to surgery to contact our office to arrange for a follow-up visit, and she verbalized understanding.  I will route this recommendation to the requesting party via Epic fax function and remove from pre-op pool.  Please call with questions.  [place], [place] 09/17/2021, 11:14 AM

## 2021-09-17 NOTE — Telephone Encounter (Signed)
Dr. [name] to review.  Patient cannot accomplish more than 4 METS of activity mainly due to her back issue.  She denies any recent chest pain.  She has a history of nonischemic cardiomyopathy and moderate CAD.  EF reached a nadir in January 2019 at 20 to 25%.  Last echocardiogram obtained in May 2021 showed EF improved to 35 to 40% with global hypokinesis.   Despite the fact that she is unable to accomplish more than 4 METS of activity, however she does not have any obvious chest discomfort.  Given the low risk nature of kyphoplasty procedure, would you be okay with her proceeding with this procedure?  Please forward your response to P CV DIV PREOP

## 2021-09-17 NOTE — Telephone Encounter (Signed)
° °  Pre-operative Risk Assessment    Patient Name: Barbara Kelley  DOB: 10-Jan-1934 MRN: [id number]      Request for Surgical Clearance{ 1. What type of surgery is being performed? Kyphoplasty   Procedure:    Date of Surgery:  Clearance TBD                            Surgeon:  Dr.Olin Surgeon's Group or Practice Name:  [facility] Phone number:  [phone] Fax number:  [phone]   Type of Clearance Requested:   - Medical    Type of Anesthesia:  Not Indicated   Additional requests/questions:    Evalee Mutton   09/17/2021, 8:31 AM

## 2021-09-22 DIAGNOSIS — M545 Low back pain, unspecified: Secondary | ICD-10-CM | POA: Diagnosis not present

## 2021-09-22 DIAGNOSIS — M5451 Vertebrogenic low back pain: Secondary | ICD-10-CM | POA: Diagnosis not present

## 2021-09-22 DIAGNOSIS — D229 Melanocytic nevi, unspecified: Secondary | ICD-10-CM | POA: Diagnosis not present

## 2021-09-22 DIAGNOSIS — S22000A Wedge compression fracture of unspecified thoracic vertebra, initial encounter for closed fracture: Secondary | ICD-10-CM | POA: Diagnosis not present

## 2021-09-22 DIAGNOSIS — M546 Pain in thoracic spine: Secondary | ICD-10-CM | POA: Diagnosis not present

## 2021-09-22 NOTE — Telephone Encounter (Signed)
Opened in error

## 2021-09-24 ENCOUNTER — Other Ambulatory Visit: Payer: Self-pay | Admitting: *Deleted

## 2021-09-24 ENCOUNTER — Ambulatory Visit
Admission: RE | Admit: 2021-09-24 | Discharge: 2021-09-24 | Disposition: A | Discharge status: Home / Self Care | Payer: Self-pay | Source: Ambulatory Visit | Attending: Oncology | Admitting: Oncology

## 2021-09-24 ENCOUNTER — Encounter: Payer: Self-pay | Admitting: *Deleted

## 2021-09-24 NOTE — Progress Notes (Signed)
PATIENT NAVIGATOR PROGRESS NOTE  Name: ARIKA MAINER Date: 09/24/2021 MRN: [id number]  DOB: 1934/09/28   Reason for visit:  Introductory phone call  Comments:  Called daughter and discussed scheduling New Pt appt for 12/27 at 1:40pm with Dr Benay Spice, discussed directions, parking and one support person and mask requirement. Verbalized understanding and gave contact information    Time spent counseling/coordinating care: < 15 minutes

## 2021-09-24 NOTE — Progress Notes (Unsigned)
Outside films of MRI thoracic spine requested from [facility] to be uploaded into EPIC

## 2021-09-26 ENCOUNTER — Encounter (HOSPITAL_COMMUNITY): Payer: Self-pay | Admitting: Emergency Medicine

## 2021-09-26 ENCOUNTER — Inpatient Hospital Stay (HOSPITAL_COMMUNITY)
Admission: EM | Admit: 2021-09-26 | Discharge: 2021-10-03 | DRG: 641 | Disposition: A | Discharge status: Skilled Nursing Facility | Payer: Medicare PPO | Attending: Internal Medicine | Admitting: Internal Medicine

## 2021-09-26 ENCOUNTER — Emergency Department (HOSPITAL_COMMUNITY): Payer: Medicare PPO

## 2021-09-26 DIAGNOSIS — M899 Disorder of bone, unspecified: Secondary | ICD-10-CM

## 2021-09-26 DIAGNOSIS — Z833 Family history of diabetes mellitus: Secondary | ICD-10-CM | POA: Diagnosis not present

## 2021-09-26 DIAGNOSIS — F0390 Unspecified dementia without behavioral disturbance: Secondary | ICD-10-CM | POA: Diagnosis present

## 2021-09-26 DIAGNOSIS — M4854XA Collapsed vertebra, not elsewhere classified, thoracic region, initial encounter for fracture: Secondary | ICD-10-CM | POA: Diagnosis not present

## 2021-09-26 DIAGNOSIS — G8929 Other chronic pain: Secondary | ICD-10-CM | POA: Diagnosis present

## 2021-09-26 DIAGNOSIS — Z79899 Other long term (current) drug therapy: Secondary | ICD-10-CM

## 2021-09-26 DIAGNOSIS — I110 Hypertensive heart disease with heart failure: Secondary | ICD-10-CM | POA: Diagnosis present

## 2021-09-26 DIAGNOSIS — R0902 Hypoxemia: Secondary | ICD-10-CM | POA: Diagnosis not present

## 2021-09-26 DIAGNOSIS — Z801 Family history of malignant neoplasm of trachea, bronchus and lung: Secondary | ICD-10-CM

## 2021-09-26 DIAGNOSIS — R531 Weakness: Secondary | ICD-10-CM

## 2021-09-26 DIAGNOSIS — I2510 Atherosclerotic heart disease of native coronary artery without angina pectoris: Secondary | ICD-10-CM | POA: Diagnosis present

## 2021-09-26 DIAGNOSIS — R41841 Cognitive communication deficit: Secondary | ICD-10-CM | POA: Diagnosis not present

## 2021-09-26 DIAGNOSIS — W19XXXA Unspecified fall, initial encounter: Secondary | ICD-10-CM

## 2021-09-26 DIAGNOSIS — I6930 Unspecified sequelae of cerebral infarction: Secondary | ICD-10-CM

## 2021-09-26 DIAGNOSIS — Z96652 Presence of left artificial knee joint: Secondary | ICD-10-CM | POA: Diagnosis present

## 2021-09-26 DIAGNOSIS — M549 Dorsalgia, unspecified: Secondary | ICD-10-CM | POA: Diagnosis not present

## 2021-09-26 DIAGNOSIS — M25552 Pain in left hip: Secondary | ICD-10-CM | POA: Diagnosis not present

## 2021-09-26 DIAGNOSIS — N179 Acute kidney failure, unspecified: Secondary | ICD-10-CM

## 2021-09-26 DIAGNOSIS — Z8673 Personal history of transient ischemic attack (TIA), and cerebral infarction without residual deficits: Secondary | ICD-10-CM

## 2021-09-26 DIAGNOSIS — S22089A Unspecified fracture of T11-T12 vertebra, initial encounter for closed fracture: Secondary | ICD-10-CM | POA: Diagnosis not present

## 2021-09-26 DIAGNOSIS — N6310 Unspecified lump in the right breast, unspecified quadrant: Secondary | ICD-10-CM | POA: Diagnosis present

## 2021-09-26 DIAGNOSIS — R5381 Other malaise: Secondary | ICD-10-CM | POA: Diagnosis present

## 2021-09-26 DIAGNOSIS — Z8041 Family history of malignant neoplasm of ovary: Secondary | ICD-10-CM | POA: Diagnosis not present

## 2021-09-26 DIAGNOSIS — Z888 Allergy status to other drugs, medicaments and biological substances status: Secondary | ICD-10-CM | POA: Diagnosis not present

## 2021-09-26 DIAGNOSIS — Z66 Do not resuscitate: Secondary | ICD-10-CM | POA: Diagnosis present

## 2021-09-26 DIAGNOSIS — Z7189 Other specified counseling: Secondary | ICD-10-CM | POA: Diagnosis not present

## 2021-09-26 DIAGNOSIS — R2689 Other abnormalities of gait and mobility: Secondary | ICD-10-CM | POA: Diagnosis not present

## 2021-09-26 DIAGNOSIS — R1312 Dysphagia, oropharyngeal phase: Secondary | ICD-10-CM | POA: Diagnosis not present

## 2021-09-26 DIAGNOSIS — Z880 Allergy status to penicillin: Secondary | ICD-10-CM | POA: Diagnosis not present

## 2021-09-26 DIAGNOSIS — Z515 Encounter for palliative care: Secondary | ICD-10-CM | POA: Diagnosis not present

## 2021-09-26 DIAGNOSIS — S2242XA Multiple fractures of ribs, left side, initial encounter for closed fracture: Secondary | ICD-10-CM | POA: Diagnosis not present

## 2021-09-26 DIAGNOSIS — Z20822 Contact with and (suspected) exposure to covid-19: Secondary | ICD-10-CM | POA: Diagnosis present

## 2021-09-26 DIAGNOSIS — I69891 Dysphagia following other cerebrovascular disease: Secondary | ICD-10-CM | POA: Diagnosis not present

## 2021-09-26 DIAGNOSIS — I5022 Chronic systolic (congestive) heart failure: Secondary | ICD-10-CM | POA: Diagnosis not present

## 2021-09-26 DIAGNOSIS — M898X9 Other specified disorders of bone, unspecified site: Secondary | ICD-10-CM

## 2021-09-26 DIAGNOSIS — N63 Unspecified lump in unspecified breast: Secondary | ICD-10-CM

## 2021-09-26 DIAGNOSIS — D164 Benign neoplasm of bones of skull and face: Secondary | ICD-10-CM | POA: Diagnosis present

## 2021-09-26 DIAGNOSIS — M5134 Other intervertebral disc degeneration, thoracic region: Secondary | ICD-10-CM | POA: Diagnosis not present

## 2021-09-26 DIAGNOSIS — M4804 Spinal stenosis, thoracic region: Secondary | ICD-10-CM | POA: Diagnosis not present

## 2021-09-26 DIAGNOSIS — R911 Solitary pulmonary nodule: Secondary | ICD-10-CM | POA: Diagnosis not present

## 2021-09-26 DIAGNOSIS — M5126 Other intervertebral disc displacement, lumbar region: Secondary | ICD-10-CM | POA: Diagnosis not present

## 2021-09-26 DIAGNOSIS — Z043 Encounter for examination and observation following other accident: Secondary | ICD-10-CM | POA: Diagnosis not present

## 2021-09-26 DIAGNOSIS — Z96643 Presence of artificial hip joint, bilateral: Secondary | ICD-10-CM | POA: Diagnosis present

## 2021-09-26 DIAGNOSIS — I69828 Other speech and language deficits following other cerebrovascular disease: Secondary | ICD-10-CM | POA: Diagnosis not present

## 2021-09-26 DIAGNOSIS — R102 Pelvic and perineal pain: Secondary | ICD-10-CM | POA: Diagnosis not present

## 2021-09-26 DIAGNOSIS — E785 Hyperlipidemia, unspecified: Secondary | ICD-10-CM | POA: Diagnosis present

## 2021-09-26 DIAGNOSIS — M5127 Other intervertebral disc displacement, lumbosacral region: Secondary | ICD-10-CM | POA: Diagnosis not present

## 2021-09-26 DIAGNOSIS — K5730 Diverticulosis of large intestine without perforation or abscess without bleeding: Secondary | ICD-10-CM | POA: Diagnosis not present

## 2021-09-26 DIAGNOSIS — J90 Pleural effusion, not elsewhere classified: Secondary | ICD-10-CM | POA: Diagnosis not present

## 2021-09-26 DIAGNOSIS — I10 Essential (primary) hypertension: Secondary | ICD-10-CM | POA: Diagnosis not present

## 2021-09-26 DIAGNOSIS — M6281 Muscle weakness (generalized): Secondary | ICD-10-CM | POA: Diagnosis not present

## 2021-09-26 DIAGNOSIS — R42 Dizziness and giddiness: Secondary | ICD-10-CM | POA: Diagnosis not present

## 2021-09-26 DIAGNOSIS — I7789 Other specified disorders of arteries and arterioles: Secondary | ICD-10-CM | POA: Diagnosis not present

## 2021-09-26 DIAGNOSIS — Z823 Family history of stroke: Secondary | ICD-10-CM | POA: Diagnosis not present

## 2021-09-26 LAB — PHOSPHORUS: Phosphorus: 3.4 mg/dL (ref 2.5–4.6)

## 2021-09-26 LAB — CBC WITH DIFFERENTIAL/PLATELET
Abs Immature Granulocytes: 0.1 10*3/uL — ABNORMAL HIGH (ref 0.00–0.07)
Basophils Absolute: 0.1 10*3/uL (ref 0.0–0.1)
Basophils Relative: 0 %
Eosinophils Absolute: 0.2 10*3/uL (ref 0.0–0.5)
Eosinophils Relative: 1 %
HCT: 30.3 % — ABNORMAL LOW (ref 36.0–46.0)
Hemoglobin: 9.5 g/dL — ABNORMAL LOW (ref 12.0–15.0)
Immature Granulocytes: 1 %
Lymphocytes Relative: 30 %
Lymphs Abs: 3.4 10*3/uL (ref 0.7–4.0)
MCH: 27.4 pg (ref 26.0–34.0)
MCHC: 31.4 g/dL (ref 30.0–36.0)
MCV: 87.3 fL (ref 80.0–100.0)
Monocytes Absolute: 1 10*3/uL (ref 0.1–1.0)
Monocytes Relative: 9 %
Neutro Abs: 6.8 10*3/uL (ref 1.7–7.7)
Neutrophils Relative %: 59 %
Platelets: 307 10*3/uL (ref 150–400)
RBC: 3.47 MIL/uL — ABNORMAL LOW (ref 3.87–5.11)
RDW: 16.2 % — ABNORMAL HIGH (ref 11.5–15.5)
WBC: 11.6 10*3/uL — ABNORMAL HIGH (ref 4.0–10.5)
nRBC: 0 % (ref 0.0–0.2)

## 2021-09-26 LAB — COMPREHENSIVE METABOLIC PANEL
ALT: 12 U/L (ref 0–44)
AST: 35 U/L (ref 15–41)
Albumin: 3.4 g/dL — ABNORMAL LOW (ref 3.5–5.0)
Alkaline Phosphatase: 137 U/L — ABNORMAL HIGH (ref 38–126)
Anion gap: 9 (ref 5–15)
BUN: 29 mg/dL — ABNORMAL HIGH (ref 8–23)
CO2: 27 mmol/L (ref 22–32)
Calcium: 11.6 mg/dL — ABNORMAL HIGH (ref 8.9–10.3)
Chloride: 101 mmol/L (ref 98–111)
Creatinine, Ser: 1.42 mg/dL — ABNORMAL HIGH (ref 0.44–1.00)
GFR, Estimated: 36 mL/min — ABNORMAL LOW (ref >60)
Glucose, Bld: 94 mg/dL (ref 70–99)
Potassium: 3.7 mmol/L (ref 3.5–5.1)
Sodium: 137 mmol/L (ref 135–145)
Total Bilirubin: 0.8 mg/dL (ref 0.3–1.2)
Total Protein: 6.5 g/dL (ref 6.5–8.1)

## 2021-09-26 LAB — URINALYSIS, ROUTINE W REFLEX MICROSCOPIC
Bilirubin Urine: NEGATIVE
Glucose, UA: NEGATIVE mg/dL
Ketones, ur: 15 mg/dL — AB
Nitrite: NEGATIVE
Specific Gravity, Urine: 1.02 (ref 1.005–1.030)
WBC, UA: >50 WBC/hpf — ABNORMAL HIGH (ref 0–5)
pH: 6 (ref 5.0–8.0)

## 2021-09-26 LAB — TROPONIN I (HIGH SENSITIVITY)
Troponin I (High Sensitivity): 24 ng/L — ABNORMAL HIGH (ref <18)
Troponin I (High Sensitivity): 22 ng/L — ABNORMAL HIGH (ref <18)

## 2021-09-26 LAB — RESP PANEL BY RT-PCR (FLU A&B, COVID) ARPGX2
Influenza A by PCR: NEGATIVE
Influenza B by PCR: NEGATIVE
SARS Coronavirus 2 by RT PCR: NEGATIVE

## 2021-09-26 LAB — MAGNESIUM: Magnesium: 2.3 mg/dL (ref 1.7–2.4)

## 2021-09-26 MED ORDER — TRAMADOL HCL 50 MG PO TABS
100.0000 mg | ORAL_TABLET | Freq: Once | ORAL | Status: AC
  Administered 2021-09-26: 19:00:00 100 mg via ORAL
  Filled 2021-09-26: qty 2

## 2021-09-26 MED ORDER — ACETAMINOPHEN 650 MG RE SUPP: 650.0000 mg | Freq: Four times a day (QID) | RECTAL | Status: DC | PRN

## 2021-09-26 MED ORDER — ONDANSETRON HCL 4 MG/2ML IJ SOLN: 4.0000 mg | Freq: Four times a day (QID) | INTRAMUSCULAR | Status: DC | PRN

## 2021-09-26 MED ORDER — ONDANSETRON HCL 4 MG PO TABS: 4.0000 mg | ORAL_TABLET | Freq: Four times a day (QID) | ORAL | Status: DC | PRN

## 2021-09-26 MED ORDER — SODIUM CHLORIDE 0.9 % IV BOLUS
1000.0000 mL | Freq: Once | INTRAVENOUS | Status: AC
  Administered 2021-09-26: 16:00:00 1000 mL via INTRAVENOUS

## 2021-09-26 MED ORDER — ALBUTEROL SULFATE (2.5 MG/3ML) 0.083% IN NEBU: 2.5000 mg | INHALATION_SOLUTION | Freq: Four times a day (QID) | RESPIRATORY_TRACT | Status: DC | PRN

## 2021-09-26 MED ORDER — ENOXAPARIN SODIUM 40 MG/0.4ML IJ SOSY
40.0000 mg | PREFILLED_SYRINGE | INTRAMUSCULAR | Status: DC
  Administered 2021-09-27 – 2021-10-03 (×7): 40 mg via SUBCUTANEOUS
  Filled 2021-09-26 (×7): qty 0.4

## 2021-09-26 MED ORDER — CARVEDILOL 6.25 MG PO TABS
6.2500 mg | ORAL_TABLET | Freq: Two times a day (BID) | ORAL | Status: DC
  Administered 2021-09-26 – 2021-10-03 (×14): 6.25 mg via ORAL
  Filled 2021-09-26 (×2): qty 1
  Filled 2021-09-26: qty 2
  Filled 2021-09-26 (×11): qty 1

## 2021-09-26 MED ORDER — OXYCODONE HCL 5 MG PO TABS
5.0000 mg | ORAL_TABLET | ORAL | Status: DC | PRN
  Administered 2021-09-27 – 2021-10-03 (×10): 5 mg via ORAL
  Filled 2021-09-26 (×10): qty 1

## 2021-09-26 MED ORDER — DIGOXIN 0.0625 MG HALF TABLET
0.0625 mg | ORAL_TABLET | Freq: Once | ORAL | Status: AC
  Administered 2021-09-26: 16:00:00 0.0625 mg via ORAL
  Filled 2021-09-26: qty 1

## 2021-09-26 MED ORDER — ACETAMINOPHEN 325 MG PO TABS
650.0000 mg | ORAL_TABLET | Freq: Four times a day (QID) | ORAL | Status: DC | PRN
  Administered 2021-09-27 – 2021-09-30 (×2): 650 mg via ORAL
  Filled 2021-09-26 (×2): qty 2

## 2021-09-26 MED ORDER — ACETAMINOPHEN 500 MG PO TABS
1000.0000 mg | ORAL_TABLET | Freq: Once | ORAL | Status: AC
  Administered 2021-09-26: 19:00:00 1000 mg via ORAL
  Filled 2021-09-26: qty 2

## 2021-09-26 MED ORDER — SODIUM CHLORIDE 0.9 % IV SOLN: INTRAVENOUS | Status: AC

## 2021-09-26 NOTE — ED Provider Notes (Signed)
Daughter came to see the patient.  And gave additional history.  The patient has been having back problems for a while and eventually got an MRI last week which showed some type of cancer and she was supposed to see Dr. Rondel Oh oncology next Tuesday.  The daughter says that she fell last night and EMS came to see her and put her into the bed.  Patient refused transportation.  The daughter Marinell Blight today and she was unable to get out of the bed..  The daughter states that she has not been eating or drinking and is too weak to stand.  I attempted to stand the patient but she was unable to stand secondary to weakness.  She also has this back pain in her lumbar area.  Patient will now get lab work done and CT scans to evaluate her further   Milton Ferguson, MD 09/26/21 1455

## 2021-09-26 NOTE — ED Triage Notes (Signed)
Pt here from assisted living with c/o fall in the middle of the night , fire out to place pt back in bed , pt c/o left hip pain this am, does have some back fx from previous falls

## 2021-09-26 NOTE — ED Provider Notes (Signed)
Too weak to stand. Labs and CTs pending. Will need admission. Daughter is with patient. Physical Exam  BP (!) 173/80 (BP Location: Left Arm)    Pulse (!) 105    Temp 98.3 F (36.8 C) (Oral)    Resp 18    SpO2 96%   Physical Exam  ED Course/Procedures     Procedures  MDM         Charlesetta Shanks, MD 09/26/21 1531

## 2021-09-26 NOTE — ED Provider Notes (Addendum)
[facility] DEPT Provider Note   CSN: [id number] Arrival date & time: 09/26/21  1043     History No chief complaint on file.   Barbara Kelley is a 87 y.o. female.  Patient had a fall today.  Patient had mild left hip discomfort initially but no pain now.  Patient is a history of dementia  The history is provided by the patient and medical records. No language interpreter was used.  Fall This is a new problem. The current episode started 3 to 5 hours ago. The problem occurs rarely. The problem has been resolved. Pertinent negatives include no chest pain, no abdominal pain and no headaches. Nothing aggravates the symptoms. Nothing relieves the symptoms. She has tried nothing for the symptoms. The treatment provided no relief.      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 ; deficit of CVA in 2008   Pneumonia    x2 most recent fall of 2017    PONV (postoperative nausea and vomiting)    Stroke ([place]) 2008   deficit of neuroplathy in left hand and left foot     Patient Active Problem List   Diagnosis Date Noted   S/P thoracentesis    Shortness of breath    Acute on chronic heart failure ([place]) 10/09/2019   Mitral regurgitation 08/11/2018   Mild CAD 08/11/2018   Closed fracture of right hip (HCC)    Closed subcapital fracture of neck of femur, right, initial encounter ([place]) 10/18/2017   Hypertensive heart disease without CHF 10/18/2017   NICM (nonischemic cardiomyopathy) ([place]) 15/17/6160   Chronic systolic heart failure ([place]) 10/18/2017   HLD (hyperlipidemia) 10/18/2017   Near syncope 10/18/2017   S/P left TKA 05/04/2017   S/P total knee replacement 05/04/2017   History of CVA  (cerebrovascular accident) 10/05/2006    Past Surgical History:  Procedure Laterality Date   ABDOMINAL HYSTERECTOMY     APPENDECTOMY     BLADDER SUSPENSION     CARDIAC CATHETERIZATION  10/2015   CHOLECYSTECTOMY     HEMIARTHROPLASTY HIP Right 10/20/2017   HIP ARTHROPLASTY Right 10/19/2017   Procedure: ARTHROPLASTY BIPOLAR HIP (HEMIARTHROPLASTY);  Surgeon: Paralee Cancel, MD;  Location: [facility];  Service: Orthopedics;  Laterality: Right;   JOINT REPLACEMENT Left 2013   left hip   TONSILLECTOMY     TOTAL KNEE ARTHROPLASTY Left 05/04/2017   Procedure: LEFT TOTAL KNEE ARTHROPLASTY;  Surgeon: Paralee Cancel, MD;  Location: [facility];  Service: Orthopedics;  Laterality: Left;  70 mins   TUBAL LIGATION       OB History   No obstetric history on file.     Family History  Problem Relation Age of Onset   Diabetes Mother    Ovarian cancer Mother    Lung cancer Father    Diabetes Sister    Stroke Maternal Grandfather     Social History   Tobacco Use   Smoking status: Never   Smokeless tobacco: Never  Vaping Use   Vaping Use: Never used  Substance Use Topics   Alcohol use: No   Drug use: No    Home Medications Prior  to Admission medications   Medication Sig Start Date End Date Taking? Authorizing Provider  b complex vitamins tablet Take 1 tablet by mouth daily.    [provider]  bisacodyl (DULCOLAX) 10 MG suppository Place 10 mg rectally daily as needed for moderate constipation (If not relieved by MOM).    [provider]  calcium carbonate (OS-CAL) 600 MG TABS tablet Take 600 mg by mouth at bedtime.     [provider]  carvedilol (COREG) 6.25 MG tablet TAKE ONE TABLET BY MOUTH TWICE A DAY WITH MEALS 05/05/21   Lelon Perla, MD  Coenzyme Q10 (COQ10) 200 MG CAPS Take 1 capsule by mouth daily. 11/01/19   Medina-Vargas, Monina C, NP  Digoxin 62.5 MCG TABS Take 0.0625 mg by mouth daily. 11/01/19   Medina-Vargas, Monina C, NP  ENTRESTO 49-51 MG TAKE ONE  TABLET BY MOUTH TWICE A DAY 02/04/21   Lelon Perla, MD  fluticasone (FLONASE) 50 MCG/ACT nasal spray Place 1 spray into both nostrils daily. AS NEEDED FOR ALLERGIC RHINITIS 11/01/19   Medina-Vargas, Monina C, NP  furosemide (LASIX) 40 MG tablet TAKE ONE TABLET BY MOUTH DAILY *TAKE ONE ADDITIONAL TABLET DAILY AS NEEDED FOR SWELLING* 08/04/21   Crenshaw, [name], MD  Ginkgo Biloba 40 MG CAPS TAKE 3 CAPSULES (120MG ) BY MOUTH ONCE DAILY FOR SUPPLEMENT (DO NOT CRUSH) 11/01/19   Medina-Vargas, Monina C, NP  Multiple Vitamins-Minerals (MULTIVITAMIN WITH MINERALS) tablet Take 1 tablet by mouth daily.    [provider]  Baruch Gouty ADD MEDPASS 120ML TWO TIMES A DAY    [provider]  Sodium Phosphates (RA SALINE ENEMA RE) Place 1 each rectally daily as needed.    [provider]  Vitamin D3 (VITAMIN D) 25 MCG tablet Take 1,000 Units by mouth daily. For supplement    [provider]  zinc gluconate 50 MG tablet Take 1 tablet (50 mg total) by mouth daily. 11/01/19   Medina-Vargas, Monina C, NP    Allergies    Doxycycline, Macrobid [nitrofurantoin], Valsartan, Lisinopril, Calcium channel blockers, and Penicillins  Review of Systems   Review of Systems  Constitutional:  Negative for appetite change and fatigue.  HENT:  Negative for congestion, ear discharge and sinus pressure.   Eyes:  Negative for discharge.  Respiratory:  Negative for cough.   Cardiovascular:  Negative for chest pain.  Gastrointestinal:  Negative for abdominal pain and diarrhea.  Genitourinary:  Negative for frequency and hematuria.  Musculoskeletal:  Negative for back pain.  Skin:  Negative for rash.  Neurological:  Negative for seizures and headaches.  Psychiatric/Behavioral:  Negative for hallucinations.    Physical Exam Updated Vital Signs BP (!) 173/80 (BP Location: Left Arm)    Pulse (!) 105    Temp 98.3 F (36.8 C) (Oral)    Resp 18    SpO2 96%   Physical Exam Vitals and nursing  note reviewed.  Constitutional:      Appearance: She is well-developed.  HENT:     Head: Normocephalic.     Nose: Nose normal.  Eyes:     General: No scleral icterus.    Conjunctiva/sclera: Conjunctivae normal.  Neck:     Thyroid: No thyromegaly.  Cardiovascular:     Rate and Rhythm: Normal rate and regular rhythm.     Heart sounds: No murmur heard.   No friction rub. No gallop.  Pulmonary:     Breath sounds: No stridor. No wheezing or rales.  Chest:  Chest wall: No tenderness.  Abdominal:     General: There is no distension.     Tenderness: There is no abdominal tenderness. There is no rebound.  Musculoskeletal:        General: Normal range of motion.     Cervical back: Neck supple.     Comments: Minimal pain in left hip  Lymphadenopathy:     Cervical: No cervical adenopathy.  Skin:    Findings: No erythema or rash.  Neurological:     Mental Status: She is alert and oriented to person, place, and time.     Motor: No abnormal muscle tone.     Coordination: Coordination normal.  Psychiatric:        Behavior: Behavior normal.    ED Results / Procedures / Treatments   Labs (all labs ordered are listed, but only abnormal results are displayed) Labs Reviewed - No data to display  EKG None  Radiology DG Pelvis 1-2 Views  Result Date: 09/26/2021 CLINICAL DATA:  Left hip pain after fall. EXAM: PELVIS - 1-2 VIEW COMPARISON:  Pelvis x-ray dated October 20, 2017. FINDINGS: Prior bilateral hip arthroplasties. No evidence of hardware failure or loosening. No acute fracture or dislocation. Osteopenia. Soft tissues are unremarkable. IMPRESSION: 1. No acute osseous abnormality. Electronically Signed   By: Titus Dubin M.D.   On: 09/26/2021 11:41   CT Head Wo Contrast  Result Date: 09/26/2021 CLINICAL DATA:  Dizziness, fall last night EXAM: CT HEAD WITHOUT CONTRAST TECHNIQUE: Contiguous axial images were obtained from the base of the skull through the vertex without  intravenous contrast. COMPARISON:  10/09/2019 FINDINGS: Brain: Small remote infarct of the right inferior cerebellum, unchanged. Remote infarcts involving the head of the left caudate nucleus and anterior limb of the left internal capsule likewise appear unchanged. Periventricular white matter and corona radiata hypodensities favor chronic ischemic microvascular white matter disease. Otherwise, the brainstem, cerebellum, cerebral peduncles, thalamus, basal ganglia, basilar cisterns, and ventricular system appear within normal limits. No intracranial hemorrhage, mass lesion, or acute CVA. Vascular: Unremarkable Skull: No acute findings. Small right frontal osteoma is only about 3 mm in short axis. Sinuses/Orbits: Mild chronic bilateral maxillary sinusitis. Other: No supplemental non-categorized findings. IMPRESSION: 1. No acute intracranial findings. 2. Small remote infarcts of the right inferior cerebellum and left caudate head/anterior limb left internal capsule. 3. Periventricular white matter and corona radiata hypodensities favor chronic ischemic microvascular white matter disease. 4. Mild chronic bilateral maxillary sinusitis. Electronically Signed   By: Van Clines M.D.   On: 09/26/2021 11:40    Procedures Procedures   Medications Ordered in ED Medications  carvedilol (COREG) tablet 6.25 mg (has no administration in time range)  digoxin (LANOXIN) tablet 0.0625 mg (has no administration in time range)    ED Course  I have reviewed the triage vital signs and the nursing notes.  Pertinent labs & imaging results that were available during my care of the patient were reviewed by me and considered in my medical decision making (see chart for details).    MDM Rules/Calculators/A&P                         Patient with a fall.  No significant injuries.  CT head negative pelvis x-rays negative.  She will take Tylenol and follow-up as needed    Final Clinical Impression(s) / ED  Diagnoses Final diagnoses:  None    Rx / DC Orders ED Discharge Orders  None        Milton Ferguson, MD 09/26/21 1423    Milton Ferguson, MD 09/26/21 [phone]

## 2021-09-26 NOTE — Discharge Instructions (Signed)
Follow-up with your doctor if any problems 

## 2021-09-26 NOTE — H&P (Signed)
History and Physical  Patient Name: Barbara Kelley     SJG:[id number]    DOB: [birth year]    DOA: 09/26/2021 PCP: [name], MD (Inactive)  Patient coming from: Assisted living  Chief Complaint: Generalized weakness, poor p.o. intake    HPI: Barbara Kelley is a 87 y.o. female, with PMH of GERD, hypertension, CAD, HFpEF, CAD, who presented to the ER on 09/26/2021 with generalized weakness, poor p.o. intake.  Approximately 1 month ago, patient developed some back pain.  He went to her PCP felt like little muscle strain.  However pain did not get better and she was referred to orthopedic surgery.  She saw orthopedic surgery who did imaging, x-rays, and look like a compression fracture, so there was plans for what sounds like kyphoplasty.  However there was concern there could be lesions after MRI was obtained and she was referred to oncology, and was supposed to see next week.  However over the past several days, she has had worsening p.o. intake, weakness, inability to get out of bed, so she was brought in to the ER for further evaluation and management.  She is taking Tylenol with only minimal relief.  She follows with cardiology for CAD and HFrEF.  At home she takes vitamin D and calcium supplementation.  She is on Lasix and Entresto at home as well.    ED course: -Vitals on admission: Afebrile, heart rate 60, blood pressure 130/59, maintaining sats on room air -Labs on initial presentation: Sodium 137, potassium 3.7, chloride 101, bicarb 27, glucose 94, BUN 29, creatinine 1.42, calcium 11 point albumin 3.7, alkaline phosphatase 137, WBC 11.2, hemoglobin 9.5, COVID-negative -Imaging obtained on admission: CT Abdomen, pelvis, chest, T-spine and L-spine, Imaging as below multiple bone lesions and fractures -In the ED the patient was given Coreg, digoxin, Tylenol, tramadol, 1 L of IV fluids, and the hospitalist service was contacted for further evaluation and management.     ROS: A  complete and thorough 12 point review of systems obtained, negative listed in HPI.     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 ; deficit of CVA in 2008   Pneumonia    x2 most recent fall of 2017    PONV (postoperative nausea and vomiting)    Stroke ([place]) 2008   deficit of neuroplathy in left hand and left foot     Past Surgical History:  Procedure Laterality Date   ABDOMINAL HYSTERECTOMY     APPENDECTOMY     BLADDER SUSPENSION     CARDIAC CATHETERIZATION  10/2015   CHOLECYSTECTOMY     HEMIARTHROPLASTY HIP Right 10/20/2017   HIP ARTHROPLASTY Right 10/19/2017   Procedure: ARTHROPLASTY BIPOLAR HIP (HEMIARTHROPLASTY);  Surgeon: Paralee Cancel, MD;  Location: [facility];  Service: Orthopedics;  Laterality: Right;   JOINT REPLACEMENT Left 2013   left hip   TONSILLECTOMY     TOTAL KNEE ARTHROPLASTY Left 05/04/2017   Procedure: LEFT TOTAL KNEE ARTHROPLASTY;  Surgeon: Paralee Cancel, MD;  Location: [facility];  Service: Orthopedics;  Laterality: Left;  70 mins   TUBAL LIGATION      Social History: Patient lives at  a retirement home.  The patient walks with some assistance.  nonsmoker.  Allergies  Allergen Reactions   Doxycycline Nausea Only    Vertigo   Macrobid [Nitrofurantoin] Other (See Comments)    Nerve pain   Valsartan Other (See Comments)    Blood pressure rise   Lisinopril Cough   Calcium Channel Blockers Palpitations    Blow pressure rise / tachycardia    Penicillins Rash    Has patient had a PCN reaction causing immediate rash, facial/tongue/throat swelling, SOB or lightheadedness with hypotension: Yes Has patient had a PCN reaction causing severe rash involving mucus membranes or skin necrosis: Yes Has patient  had a PCN reaction that required hospitalization: Unk Has patient had a PCN reaction occurring within the last 10 years: No If all of the above answers are "NO", then may proceed with Cephalosporin use.     Family history: family history includes Diabetes in her mother and sister; Lung cancer in her father; Ovarian cancer in her mother; Stroke in her maternal grandfather.  Prior to Admission medications   Medication Sig Start Date End Date Taking? Authorizing Provider  b complex vitamins tablet Take 1 tablet by mouth daily.    [provider]  bisacodyl (DULCOLAX) 10 MG suppository Place 10 mg rectally daily as needed for moderate constipation (If not relieved by MOM).    [provider]  calcium carbonate (OS-CAL) 600 MG TABS tablet Take 600 mg by mouth at bedtime.     [provider]  carvedilol (COREG) 6.25 MG tablet TAKE ONE TABLET BY MOUTH TWICE A DAY WITH MEALS 05/05/21   Lelon Perla, MD  Coenzyme Q10 (COQ10) 200 MG CAPS Take 1 capsule by mouth daily. 11/01/19   Medina-Vargas, Monina C, NP  Digoxin 62.5 MCG TABS Take 0.0625 mg by mouth daily. 11/01/19   Medina-Vargas, Monina C, NP  ENTRESTO 49-51 MG TAKE ONE TABLET BY MOUTH TWICE A DAY 02/04/21   Lelon Perla, MD  fluticasone (FLONASE) 50 MCG/ACT nasal spray Place 1 spray into both nostrils daily. AS NEEDED FOR ALLERGIC RHINITIS 11/01/19   Medina-Vargas, Monina C, NP  furosemide (LASIX) 40 MG tablet TAKE ONE TABLET BY MOUTH DAILY *TAKE ONE ADDITIONAL TABLET DAILY AS NEEDED FOR SWELLING* 08/04/21   Crenshaw, [name], MD  Ginkgo Biloba 40 MG CAPS TAKE 3 CAPSULES (120MG) BY MOUTH ONCE DAILY FOR SUPPLEMENT (DO NOT CRUSH) 11/01/19   Medina-Vargas, Monina C, NP  Multiple Vitamins-Minerals (MULTIVITAMIN WITH MINERALS) tablet Take 1 tablet by mouth daily.    [provider]  Baruch Gouty ADD MEDPASS 120ML TWO TIMES A DAY    [provider]  Sodium Phosphates (RA SALINE ENEMA RE) Place 1 each  rectally daily as needed.    [provider]  Vitamin D3 (VITAMIN D) 25 MCG tablet Take 1,000 Units by mouth daily. For supplement    [provider]  zinc gluconate 50 MG tablet Take 1 tablet (50 mg total) by mouth daily. 11/01/19   Medina-Vargas, Monina C, NP       Physical Exam: BP 124/77    Pulse (!) 55    Temp 98.3 F (36.8 C) (Oral)    Resp 15    SpO2 98%   General appearance: Frail-appearing adult female, alert and in no acute distress .   Eyes: Anicteric, conjunctiva pink, lids and lashes normal. PERRL.    ENT: No nasal deformity, discharge, epistaxis.  Hearing intact. OP moist without lesions.   Neck: No neck  masses.  Trachea midline.  No thyromegaly/tenderness. Lymph: No cervical or supraclavicular lymphadenopathy. Skin: Warm and dry.  No jaundice.  No suspicious rashes or lesions. Cardiac: RRR, nl S1-S2, no murmurs appreciated.  No LE edema.  Radial and pedal pulses 2+ and symmetric. Respiratory: Normal respiratory rate and rhythm.  CTAB without rales or wheezes. Abdomen: Abdomen soft.  No tenderness with palpation. No ascites, distension, hepatosplenomegaly.   MSK: No deformities or effusions of the large joints of the upper or lower extremities bilaterally.  No cyanosis or clubbing. Neuro: Cranial nerves 2 through 12 grossly intact.  Sensation intact to light touch. Speech is fluent.  Marland Kitchen    Psych: Sensorium intact and responding to questions, attention normal.  Behavior appropriate.  Judgment and insight appear normal.    Labs on Admission:  I have personally reviewed following labs and imaging studies: CBC: Recent Labs  Lab 09/26/21 1553  WBC 11.6*  NEUTROABS 6.8  HGB 9.5*  HCT 30.3*  MCV 87.3  PLT 182   Basic Metabolic Panel: Recent Labs  Lab 09/26/21 1553  NA 137  K 3.7  CL 101  CO2 27  GLUCOSE 94  BUN 29*  CREATININE 1.42*  CALCIUM 11.6*   GFR: CrCl cannot be calculated (Unknown ideal weight.).  Liver Function Tests: Recent Labs   Lab 09/26/21 1553  AST 35  ALT 12  ALKPHOS 137*  BILITOT 0.8  PROT 6.5  ALBUMIN 3.4*   No results for input(s): LIPASE, AMYLASE in the last 168 hours. No results for input(s): AMMONIA in the last 168 hours. Coagulation Profile: No results for input(s): INR, PROTIME in the last 168 hours. Cardiac Enzymes: No results for input(s): CKTOTAL, CKMB, CKMBINDEX, TROPONINI in the last 168 hours. BNP (last 3 results) No results for input(s): PROBNP in the last 8760 hours. HbA1C: No results for input(s): HGBA1C in the last 72 hours. CBG: No results for input(s): GLUCAP in the last 168 hours. Lipid Profile: No results for input(s): CHOL, HDL, LDLCALC, TRIG, CHOLHDL, LDLDIRECT in the last 72 hours. Thyroid Function Tests: No results for input(s): TSH, T4TOTAL, FREET4, T3FREE, THYROIDAB in the last 72 hours. Anemia Panel: No results for input(s): VITAMINB12, FOLATE, FERRITIN, TIBC, IRON, RETICCTPCT in the last 72 hours.   Recent Results (from the past 240 hour(s))  Resp Panel by RT-PCR (Flu A&B, Covid) Nasopharyngeal Swab     Status: None   Collection Time: 09/26/21  3:52 PM   Specimen: Nasopharyngeal Swab; Nasopharyngeal(NP) swabs in vial transport medium  Result Value Ref Range Status   SARS Coronavirus 2 by RT PCR NEGATIVE NEGATIVE Final    Comment: (NOTE) SARS-CoV-2 target nucleic acids are NOT DETECTED.  The SARS-CoV-2 RNA is generally detectable in upper respiratory specimens during the acute phase of infection. The lowest concentration of SARS-CoV-2 viral copies this assay can detect is 138 copies/mL. A negative result does not preclude SARS-Cov-2 infection and should not be used as the sole basis for treatment or other patient management decisions. A negative result may occur with  improper specimen collection/handling, submission of specimen other than nasopharyngeal swab, presence of viral mutation(s) within the areas targeted by this assay, and inadequate number of  viral copies(<138 copies/mL). A negative result must be combined with clinical observations, patient history, and epidemiological information. The expected result is Negative.  Fact Sheet for Patients:  EntrepreneurPulse.com.au  Fact Sheet for Healthcare Providers:  IncredibleEmployment.be  This test is no t yet approved or cleared by the [place] and  has been authorized for detection and/or diagnosis of SARS-CoV-2 by FDA under an Emergency Use Authorization (EUA). This EUA will remain  in effect (meaning this test can be used) for the duration of the COVID-19 declaration under Section 564(b)(1) of the Act, 21 U.S.C.section 360bbb-3(b)(1), unless the authorization is terminated  or revoked sooner.       Influenza A by PCR NEGATIVE NEGATIVE Final   Influenza B by PCR NEGATIVE NEGATIVE Final    Comment: (NOTE) The Xpert Xpress SARS-CoV-2/FLU/RSV plus assay is intended as an aid in the diagnosis of influenza from Nasopharyngeal swab specimens and should not be used as a sole basis for treatment. Nasal washings and aspirates are unacceptable for Xpert Xpress SARS-CoV-2/FLU/RSV testing.  Fact Sheet for Patients: EntrepreneurPulse.com.au  Fact Sheet for Healthcare Providers: IncredibleEmployment.be  This test is not yet approved or cleared by the [place] FDA and has been authorized for detection and/or diagnosis of SARS-CoV-2 by FDA under an Emergency Use Authorization (EUA). This EUA will remain in effect (meaning this test can be used) for the duration of the COVID-19 declaration under Section 564(b)(1) of the Act, 21 U.S.C. section 360bbb-3(b)(1), unless the authorization is terminated or revoked.  Performed at [facility], [place] [address], [place], [place] [address]            Radiological Exams on Admission: Personally reviewed imaging which shows: Imaging as  below multiple bone lesions and fractures  CT ABDOMEN PELVIS WO CONTRAST  Result Date: 09/26/2021 CLINICAL DATA:  Status post fall. Recent diagnosis cervical vertebrae fracture and cancer unknown primary Unable to walk well, change in mental status EXAM: CT CHEST, ABDOMEN AND PELVIS WITHOUT CONTRAST TECHNIQUE: Multidetector CT imaging of the chest, abdomen and pelvis was performed following the standard protocol without IV contrast. COMPARISON:  CT chest abdomen pelvis 09/18/2016. FINDINGS: CHEST: Ports and Devices: None. Lungs/airways: Likely atelectasis of the inferior right upper lobe. No focal consolidation. Subpleural 0.8 x 0.7 and 0.4 x 0.3 cm nodules at the right base (3:103). No pulmonary mass. No pulmonary contusion or laceration. No pneumatocele formation. The central airways are patent. Pleura: Trace bilateral pleural effusions. No pneumothorax. No hemothorax. Lymph Nodes: Limited evaluation for hilar lymphadenopathy on this noncontrast study. No mediastinal lymphadenopathy. Right axillary lymphadenopathy: 1.7 cm (2:14). No left axillary adenopathy. Mediastinum: No pneumomediastinum. The thoracic aorta is normal in caliber. At least moderate atherosclerotic plaque. The heart is normal in size. No significant pericardial effusion. The esophagus is unremarkable. The thyroid is unremarkable. Chest Wall / Breasts: Right breast mass measuring 3.2 x 2.6 cm (2:33). Musculoskeletal: Diffusely decreased bone density with question underlying diffuse lytic lesions involving both the cortex and the medulla of the axial and appendicular skeleton.No definite acute displaced rib or sternal fracture. Bilateral multiple subacute to chronic rib fractures. Please see separately dictated CT thoracolumbar spine 09/26/2021. ABDOMEN / PELVIS: Liver: Not enlarged. No focal lesion. Biliary System: The gallbladder is otherwise unremarkable with no radio-opaque gallstones. No biliary ductal dilatation. Pancreas: Normal  pancreatic contour. No main pancreatic duct dilatation. Spleen: Not enlarged. No focal lesion. Adrenal Glands: A 1.5 cm lesion with a density of 2 Hounsfield units within the right adrenal gland likely represents an adenoma. No left adrenal nodularity. Kidneys: No hydroureteronephrosis. No nephroureterolithiasis. No contour deforming renal mass. The urinary bladder is unremarkable. Bowel: No small or large bowel wall thickening or dilatation. Diffuse colonic diverticulosis. The appendix is unremarkable. Mesentery, Omentum, and Peritoneum: No simple free fluid ascites. No pneumoperitoneum. No mesenteric  hematoma identified. No organized fluid collection. Pelvic Organs: Not well visualized due to streak artifact originating from bilateral femoral surgical hardware. Likely hysterectomy. Lymph Nodes: No abdominal, pelvic, inguinal lymphadenopathy. Vasculature: No abdominal aorta or iliac aneurysm. Musculoskeletal: No significant soft tissue hematoma. There is a superior right of midline superficial back soft tissue density lesion measuring 3 x 1.5 cm. Diffusely decreased bone density with question underlying diffuse lytic lesions involving both the cortex and the medulla of the axial and appendicular skeleton. Bilateral total hip arthroplasty. No definite acute displaced pelvic fracture. Please see separately dictated CT thoracolumbar spine 09/26/2021. IMPRESSION: 1. No acute traumatic injury to the chest, abdomen, or pelvis with markedly limited evaluation on this noncontrast study. 2. No acute displaced rib fracture. Bilateral multiple subacute to chronic rib fractures. 3. Please see separately dictated CT thoracolumbar spine 09/26/2021. Other imaging findings of potential clinical significance: 1. A 3.2 x 2.6 cm right breast mass as well as right axilla lymphadenopathy suggestive of malignancy. Correlate with diagnostic mammography. If possible, please obtain diagnostic mammography appointment for next business day.  2. Diffusely decreased bone density with underlying diffuse lytic lesions of both the cortex and medulla of the axial and appendicular skeleton suggestive with metastases. 3. Indeterminate 0.8 x 0.7 and 0.4 x 0.3 cm right base subpleural nodules. 4. Nonspecific superior right of midline superficial back soft tissue density lesion measuring 3 x 1.5 cm. Correlate with physical exam. 5. Colonic diverticulosis with no acute diverticulitis. Electronically Signed   By: Iven Finn M.D.   On: 09/26/2021 18:07   DG Pelvis 1-2 Views  Result Date: 09/26/2021 CLINICAL DATA:  Left hip pain after fall. EXAM: PELVIS - 1-2 VIEW COMPARISON:  Pelvis x-ray dated October 20, 2017. FINDINGS: Prior bilateral hip arthroplasties. No evidence of hardware failure or loosening. No acute fracture or dislocation. Osteopenia. Soft tissues are unremarkable. IMPRESSION: 1. No acute osseous abnormality. Electronically Signed   By: Titus Dubin M.D.   On: 09/26/2021 11:41   CT Head Wo Contrast  Result Date: 09/26/2021 CLINICAL DATA:  Dizziness, fall last night EXAM: CT HEAD WITHOUT CONTRAST TECHNIQUE: Contiguous axial images were obtained from the base of the skull through the vertex without intravenous contrast. COMPARISON:  10/09/2019 FINDINGS: Brain: Small remote infarct of the right inferior cerebellum, unchanged. Remote infarcts involving the head of the left caudate nucleus and anterior limb of the left internal capsule likewise appear unchanged. Periventricular white matter and corona radiata hypodensities favor chronic ischemic microvascular white matter disease. Otherwise, the brainstem, cerebellum, cerebral peduncles, thalamus, basal ganglia, basilar cisterns, and ventricular system appear within normal limits. No intracranial hemorrhage, mass lesion, or acute CVA. Vascular: Unremarkable Skull: No acute findings. Small right frontal osteoma is only about 3 mm in short axis. Sinuses/Orbits: Mild chronic bilateral maxillary  sinusitis. Other: No supplemental non-categorized findings. IMPRESSION: 1. No acute intracranial findings. 2. Small remote infarcts of the right inferior cerebellum and left caudate head/anterior limb left internal capsule. 3. Periventricular white matter and corona radiata hypodensities favor chronic ischemic microvascular white matter disease. 4. Mild chronic bilateral maxillary sinusitis. Electronically Signed   By: Van Clines M.D.   On: 09/26/2021 11:40   CT CHEST WO CONTRAST  Result Date: 09/26/2021 CLINICAL DATA:  Status post fall. Recent diagnosis cervical vertebrae fracture and cancer unknown primary Unable to walk well, change in mental status EXAM: CT CHEST, ABDOMEN AND PELVIS WITHOUT CONTRAST TECHNIQUE: Multidetector CT imaging of the chest, abdomen and pelvis was performed following the standard protocol without  IV contrast. COMPARISON:  CT chest abdomen pelvis 09/18/2016. FINDINGS: CHEST: Ports and Devices: None. Lungs/airways: Likely atelectasis of the inferior right upper lobe. No focal consolidation. Subpleural 0.8 x 0.7 and 0.4 x 0.3 cm nodules at the right base (3:103). No pulmonary mass. No pulmonary contusion or laceration. No pneumatocele formation. The central airways are patent. Pleura: Trace bilateral pleural effusions. No pneumothorax. No hemothorax. Lymph Nodes: Limited evaluation for hilar lymphadenopathy on this noncontrast study. No mediastinal lymphadenopathy. Right axillary lymphadenopathy: 1.7 cm (2:14). No left axillary adenopathy. Mediastinum: No pneumomediastinum. The thoracic aorta is normal in caliber. At least moderate atherosclerotic plaque. The heart is normal in size. No significant pericardial effusion. The esophagus is unremarkable. The thyroid is unremarkable. Chest Wall / Breasts: Right breast mass measuring 3.2 x 2.6 cm (2:33). Musculoskeletal: Diffusely decreased bone density with question underlying diffuse lytic lesions involving both the cortex and the  medulla of the axial and appendicular skeleton.No definite acute displaced rib or sternal fracture. Bilateral multiple subacute to chronic rib fractures. Please see separately dictated CT thoracolumbar spine 09/26/2021. ABDOMEN / PELVIS: Liver: Not enlarged. No focal lesion. Biliary System: The gallbladder is otherwise unremarkable with no radio-opaque gallstones. No biliary ductal dilatation. Pancreas: Normal pancreatic contour. No main pancreatic duct dilatation. Spleen: Not enlarged. No focal lesion. Adrenal Glands: A 1.5 cm lesion with a density of 2 Hounsfield units within the right adrenal gland likely represents an adenoma. No left adrenal nodularity. Kidneys: No hydroureteronephrosis. No nephroureterolithiasis. No contour deforming renal mass. The urinary bladder is unremarkable. Bowel: No small or large bowel wall thickening or dilatation. Diffuse colonic diverticulosis. The appendix is unremarkable. Mesentery, Omentum, and Peritoneum: No simple free fluid ascites. No pneumoperitoneum. No mesenteric hematoma identified. No organized fluid collection. Pelvic Organs: Not well visualized due to streak artifact originating from bilateral femoral surgical hardware. Likely hysterectomy. Lymph Nodes: No abdominal, pelvic, inguinal lymphadenopathy. Vasculature: No abdominal aorta or iliac aneurysm. Musculoskeletal: No significant soft tissue hematoma. There is a superior right of midline superficial back soft tissue density lesion measuring 3 x 1.5 cm. Diffusely decreased bone density with question underlying diffuse lytic lesions involving both the cortex and the medulla of the axial and appendicular skeleton. Bilateral total hip arthroplasty. No definite acute displaced pelvic fracture. Please see separately dictated CT thoracolumbar spine 09/26/2021. IMPRESSION: 1. No acute traumatic injury to the chest, abdomen, or pelvis with markedly limited evaluation on this noncontrast study. 2. No acute displaced rib  fracture. Bilateral multiple subacute to chronic rib fractures. 3. Please see separately dictated CT thoracolumbar spine 09/26/2021. Other imaging findings of potential clinical significance: 1. A 3.2 x 2.6 cm right breast mass as well as right axilla lymphadenopathy suggestive of malignancy. Correlate with diagnostic mammography. If possible, please obtain diagnostic mammography appointment for next business day. 2. Diffusely decreased bone density with underlying diffuse lytic lesions of both the cortex and medulla of the axial and appendicular skeleton suggestive with metastases. 3. Indeterminate 0.8 x 0.7 and 0.4 x 0.3 cm right base subpleural nodules. 4. Nonspecific superior right of midline superficial back soft tissue density lesion measuring 3 x 1.5 cm. Correlate with physical exam. 5. Colonic diverticulosis with no acute diverticulitis. Electronically Signed   By: Iven Finn M.D.   On: 09/26/2021 18:07   CT T-SPINE NO CHARGE  Result Date: 09/26/2021 CLINICAL DATA:  Recent diagnosis cervical vertebrae fracture and cancer unknown primary. Unable to walk well, change in mental status EXAM: CT THORACIC AND LUMBAR SPINE WITHOUT CONTRAST TECHNIQUE: Multidetector  CT imaging of the thoracic and lumbar spine was performed without contrast. Multiplanar CT image reconstructions were also generated. COMPARISON:  None. FINDINGS: CT THORACIC SPINE FINDINGS Alignment: Normal Vertebrae: Extensive and widespread lytic lesions throughout the entire thoracic spine. Similar lesions also present in multiple ribs. Probable myeloma Compression fractures of T11 and T12. Bony retropulsion of the T12 fracture into the canal causing moderate spinal stenosis. This appears to be a pathologic fracture. Fracture of the left second rib anteriorly may be acute. Paraspinal and other soft tissues: Mild atherosclerotic calcification aortic arch. Pulmonary artery enlargement. Mild pleural thickening bilaterally inferiorly. Visualized  lungs are clear without infiltrate or mass. Disc levels: Disc degeneration throughout the thoracic spine. No significant spinal stenosis. CT LUMBAR SPINE FINDINGS Segmentation: 5 lumbar segments. Alignment: Normal Vertebrae: Negative for lumbar fracture. Widespread lytic lesions throughout the lumbar vertebra similar to that seen in the thoracic spine. Paraspinal and other soft tissues: Atherosclerotic calcification aorta and iliac arteries. No aneurysm. Mild paraspinous edema around the T12 fracture. No retroperitoneal adenopathy. Disc levels: L1-2: Moderate spinal stenosis with disc and facet degeneration L2-3: Mild to moderate spinal stenosis due to disc bulging and facet hypertrophy L3-4: Mild spinal stenosis due to disc and facet degeneration. L4-5: Mild spinal stenosis due to disc and facet degeneration L5-S1: Bilateral facet hypertrophy and disc bulging. Mild subarticular stenosis bilaterally IMPRESSION: CT THORACIC SPINE IMPRESSION 1. Widespread lytic lesions throughout the thoracic spine compatible with multiple myeloma. Fractures of T11 and T12. Bony retropulsion of the T12 fracture into the canal causing moderate spinal stenosis. 2. Fracture of the left second rib anteriorly may be acute. Multiple rib lesions compatible with myeloma. CT LUMBAR SPINE IMPRESSION 1. Widespread lytic lesions throughout the lumbar spine compatible with myeloma. No fracture. 2. Multilevel lumbar degenerative change. Moderate spinal stenosis L1-2. Electronically Signed   By: Franchot Gallo M.D.   On: 09/26/2021 18:04   CT L-SPINE NO CHARGE  Result Date: 09/26/2021 CLINICAL DATA:  Recent diagnosis cervical vertebrae fracture and cancer unknown primary. Unable to walk well, change in mental status EXAM: CT THORACIC AND LUMBAR SPINE WITHOUT CONTRAST TECHNIQUE: Multidetector CT imaging of the thoracic and lumbar spine was performed without contrast. Multiplanar CT image reconstructions were also generated. COMPARISON:  None.  FINDINGS: CT THORACIC SPINE FINDINGS Alignment: Normal Vertebrae: Extensive and widespread lytic lesions throughout the entire thoracic spine. Similar lesions also present in multiple ribs. Probable myeloma Compression fractures of T11 and T12. Bony retropulsion of the T12 fracture into the canal causing moderate spinal stenosis. This appears to be a pathologic fracture. Fracture of the left second rib anteriorly may be acute. Paraspinal and other soft tissues: Mild atherosclerotic calcification aortic arch. Pulmonary artery enlargement. Mild pleural thickening bilaterally inferiorly. Visualized lungs are clear without infiltrate or mass. Disc levels: Disc degeneration throughout the thoracic spine. No significant spinal stenosis. CT LUMBAR SPINE FINDINGS Segmentation: 5 lumbar segments. Alignment: Normal Vertebrae: Negative for lumbar fracture. Widespread lytic lesions throughout the lumbar vertebra similar to that seen in the thoracic spine. Paraspinal and other soft tissues: Atherosclerotic calcification aorta and iliac arteries. No aneurysm. Mild paraspinous edema around the T12 fracture. No retroperitoneal adenopathy. Disc levels: L1-2: Moderate spinal stenosis with disc and facet degeneration L2-3: Mild to moderate spinal stenosis due to disc bulging and facet hypertrophy L3-4: Mild spinal stenosis due to disc and facet degeneration. L4-5: Mild spinal stenosis due to disc and facet degeneration L5-S1: Bilateral facet hypertrophy and disc bulging. Mild subarticular stenosis bilaterally IMPRESSION: CT THORACIC SPINE  IMPRESSION 1. Widespread lytic lesions throughout the thoracic spine compatible with multiple myeloma. Fractures of T11 and T12. Bony retropulsion of the T12 fracture into the canal causing moderate spinal stenosis. 2. Fracture of the left second rib anteriorly may be acute. Multiple rib lesions compatible with myeloma. CT LUMBAR SPINE IMPRESSION 1. Widespread lytic lesions throughout the lumbar  spine compatible with myeloma. No fracture. 2. Multilevel lumbar degenerative change. Moderate spinal stenosis L1-2. Electronically Signed   By: Franchot Gallo M.D.   On: 09/26/2021 18:04          Assessment/Plan   1.  Generalized weakness -Likely related to lytic vertebral bone lesions with resultant fractures and hypercalcemia - PT and OT consulted - Fall precautions - See further treatment plans below  2.  Widespread bony lesions -imaging obtained showed widespread lytic lesions throughout the spine, concern for malignancy, myeloma - He was scheduled to see oncology next week.  We will consider consult in the a.m. - SPEP, UPEP, free light chains ordered - Pain control as warranted  3.  Hypercalcemia -On admission calcium 11.6 -Likely secondary to bony lesions - Ionized calcium, PTH, vitamin D ordered -Hold home calcium and vitamin D supplementation - We will hold off on bisphosphonate currently as only mildly elevated and has an AKI - We will give gentle IV fluids, normal saline at 50 ml/hr given HFrEF -Follow-up labs ordered  4.  AKI -On admission creatinine 1.42 - Likely secondary to prerenal/poor p.o. intake/hypercalcemia -Baseline creatinine appears around 0.9 -Hold home Entresto for now - Gentle IV fluid - Urinalysis pending - Follow-up labs ordered  5.  Chronic HFrEF -No acute exacerbation at this time.  But will need to monitor closely as started on IV fluids given hypercalcemia - We will hold home Lasix for now - Hold home Entresto given AKI -Resume home beta-blocker once home meds reconciled  6.  Hypertension - Resume home beta-blocker - Hold home Entresto given AKI     DVT prophylaxis: Lovenox Code Status: Full Family Communication: Daughter bedside Disposition Plan: Anticipate discharge nursing facility when medically optimized Consults called: None Admission status: Telemetry      Medical decision making: Patient seen at 7:49 PM on  09/26/2021.  The patient was discussed with ER provider.  What exists of the patient's chart was reviewed in depth and summarized above.  Clinical condition: Fair.        Doran Heater [facility] Please page though [place] or Epic secure chat:  For password, contact charge nurse

## 2021-09-27 ENCOUNTER — Other Ambulatory Visit: Payer: Self-pay

## 2021-09-27 DIAGNOSIS — R531 Weakness: Secondary | ICD-10-CM

## 2021-09-27 LAB — COMPREHENSIVE METABOLIC PANEL WITH GFR
ALT: 10 U/L (ref 0–44)
AST: 30 U/L (ref 15–41)
Albumin: 2.8 g/dL — ABNORMAL LOW (ref 3.5–5.0)
Alkaline Phosphatase: 111 U/L (ref 38–126)
Anion gap: 8 (ref 5–15)
BUN: 25 mg/dL — ABNORMAL HIGH (ref 8–23)
CO2: 26 mmol/L (ref 22–32)
Calcium: 10.9 mg/dL — ABNORMAL HIGH (ref 8.9–10.3)
Chloride: 106 mmol/L (ref 98–111)
Creatinine, Ser: 1.21 mg/dL — ABNORMAL HIGH (ref 0.44–1.00)
GFR, Estimated: 44 mL/min — ABNORMAL LOW
Glucose, Bld: 125 mg/dL — ABNORMAL HIGH (ref 70–99)
Potassium: 3.5 mmol/L (ref 3.5–5.1)
Sodium: 140 mmol/L (ref 135–145)
Total Bilirubin: 0.6 mg/dL (ref 0.3–1.2)
Total Protein: 5.6 g/dL — ABNORMAL LOW (ref 6.5–8.1)

## 2021-09-27 LAB — CBC
HCT: 27.1 % — ABNORMAL LOW (ref 36.0–46.0)
Hemoglobin: 8.5 g/dL — ABNORMAL LOW (ref 12.0–15.0)
MCH: 27.7 pg (ref 26.0–34.0)
MCHC: 31.4 g/dL (ref 30.0–36.0)
MCV: 88.3 fL (ref 80.0–100.0)
Platelets: 288 10*3/uL (ref 150–400)
RBC: 3.07 MIL/uL — ABNORMAL LOW (ref 3.87–5.11)
RDW: 16.3 % — ABNORMAL HIGH (ref 11.5–15.5)
WBC: 11.5 10*3/uL — ABNORMAL HIGH (ref 4.0–10.5)
nRBC: 0 % (ref 0.0–0.2)

## 2021-09-27 LAB — PHOSPHORUS: Phosphorus: 3.7 mg/dL (ref 2.5–4.6)

## 2021-09-27 LAB — MAGNESIUM: Magnesium: 2.2 mg/dL (ref 1.7–2.4)

## 2021-09-27 LAB — VITAMIN D 25 HYDROXY (VIT D DEFICIENCY, FRACTURES): Vit D, 25-Hydroxy: 90.82 ng/mL (ref 30–100)

## 2021-09-27 MED ORDER — LIP MEDEX EX OINT
TOPICAL_OINTMENT | CUTANEOUS | Status: AC
  Filled 2021-09-27: qty 7

## 2021-09-27 MED ORDER — ZOLEDRONIC ACID 4 MG/5ML IV CONC
4.0000 mg | Freq: Once | INTRAVENOUS | Status: AC
  Administered 2021-09-27: 15:00:00 4 mg via INTRAVENOUS
  Filled 2021-09-27: qty 5

## 2021-09-27 MED ORDER — ENSURE ENLIVE PO LIQD
237.0000 mL | Freq: Two times a day (BID) | ORAL | Status: DC
  Administered 2021-09-27 – 2021-10-03 (×9): 237 mL via ORAL

## 2021-09-27 MED ORDER — ADULT MULTIVITAMIN W/MINERALS CH
1.0000 | ORAL_TABLET | Freq: Every day | ORAL | Status: DC
  Administered 2021-09-28 – 2021-10-03 (×6): 1 via ORAL
  Filled 2021-09-27 (×6): qty 1

## 2021-09-27 NOTE — Progress Notes (Signed)
Initial Nutrition Assessment  DOCUMENTATION CODES:   Not applicable  INTERVENTION:   Ensure Enlive po BID, each supplement provides 350 kcal and 20 grams of protein  Magic cup TID with meals, each supplement provides 290 kcal and 9 grams of protein  MVI po daily   Dysphagia 3 diet   Pt at high refeed risk; recommend monitor potassium, magnesium and phosphorus labs daily until stable  NUTRITION DIAGNOSIS:   Inadequate oral intake related to acute illness as evidenced by per patient/family report.  GOAL:   Patient will meet greater than or equal to 90% of their needs  MONITOR:   PO intake, Supplement acceptance, Labs, Weight trends, Skin, I & O's  REASON FOR ASSESSMENT:   Malnutrition Screening Tool    ASSESSMENT:   87 y.o. female, with PMH of dmentia, GERD, hypertension, CAD, HFpEF and CVA, who presented to the ER on 09/26/2021 with generalized weakness and poor p.o. intake. Pt found to have lytic vertebral bone lesions with resultant fractures with suspicion for malignancy.  RD working remotely.  Unable to reach pt by phone. Per chart review, family reports pt with poor oral intake and weakness pta. Pt documented to have eaten 50% of her breakfast this morning. RD will add supplements and MVI to help pt meet her estimated needs. RD will also change pt to a mechanical soft diet. Per chart, pt appears weight stable at baseline but there is no documented weight this admission and last weight in chart is from 11/22. RD will obtain nutrition related history and exam at follow-up.   Medications reviewed and include: lovenox, zometa  Labs reviewed: K 3.5 wnl, BUN 25(H), creat 1.21(H), Ca 10.9(H), P 3.7 wnl, Mg 2.2 wnl, alb 2.8(L) Wbc- 11.5(H), Hgb 8.5(L), Hct 27.1(L)  NUTRITION - FOCUSED PHYSICAL EXAM: Unable to perform at this time   Diet Order:   Diet Order             DIET DYS 3 Room service appropriate? No; Fluid consistency: Thin  Diet effective now                   EDUCATION NEEDS:   No education needs have been identified at this time  Skin:  Skin Assessment: Reviewed RN Assessment  Last BM:  12/24  Height:   Ht Readings from Last 1 Encounters:  08/26/21 4\' 11"  (1.499 m)    Weight:   Wt Readings from Last 1 Encounters:  08/26/21 54.9 kg    Ideal Body Weight:  44.5 kg  BMI:  There is no height or weight on file to calculate BMI.  Estimated Nutritional Needs:   Kcal:  1400-1600kcal/day  Protein:  70-80g/day  Fluid:  1.2-1.4L/day  Koleen Distance MS, RD, LDN Please refer to [facility] for RD and/or RD on-call/weekend/after hours pager

## 2021-09-27 NOTE — Progress Notes (Signed)
PROGRESS NOTE    Barbara Kelley  ZHG:[id number] DOB: 11/19/1933 DOA: 09/26/2021  PCP: [name], MD (Inactive)   Brief Narrative:  This 87 years old female with PMH significant for GERD, hypertension, CAD, HFpEF presented in the ED with c/o: generalized weakness, worsening chronic back pain and decreased p.o. intake.  Patient reports about a month ago she has developed back pain,  She has seen her PCP who has referred her to orthopedics after imaging it came out that she has compression fracture. She was considered to have kyphoplasty but MRI was obtained and found to have  lytic lesions.  She was referred to oncology and she was supposed to see next week however she has worsening back pain causing generalized weakness,  she was unable to get out of bed. She is found to have hypercalcemia, widespread bony lesions consistent with metastasis, oncology is consulted  Assessment & Plan:   Principal Problem:   Generalized weakness Active Problems:   History of CVA (cerebrovascular accident)   Chronic systolic heart failure (HCC)   AKI (acute kidney injury) ([place])   Hypercalcemia   Lytic bone lesions on xray   Generalized weakness: Could be multifactorial, likely from Lytic vertebral bone lesions with resultant fractures and hypercalcemia. Continue IV hydration, fall precautions. PT and OT evaluation.  Metastatic bony lesions: Imaging showed widespread Lytic lesions throughout the spine concern for malignancy / myeloma. She was scheduled to see oncology next week.  Oncology is consulted. Follow-up SPEP, UPEP, free light chains. Continue adequate pain control with pain medications  Hypercalcemia: Serum calcium on admission 11.6. Likely secondary to lytic bone lesions. Follow-up ionized calcium, PTH, vitamin D. She is given Zometa, follow-up serum calcium. Hold on bisphosphonates. Continue gentle IV hydration. Serum calcium improving.  AKI: Suspect prerenal due to decreased  p.o. intake and hypercalcemia Hold Entresto. Continue IV gentle hydration,  Continue to monitor serum creatinine  Chronic HFrEF: Does not seem in acute exacerbation at this time.   Closely monitor because she is receiving IV hydration for hypercalcemia. We will hold on home Lasix for now.  Hold Entresto given AKI.  Essential hypertension: Continue beta-blocker   DVT prophylaxis: Lovenox Code Status: Full code Family Communication: Daughter at bedside Disposition Plan:   Status is: Inpatient  Remains inpatient appropriate because:  Admitted for hypercalcemia requiring IV hydration, metastatic bone lesions requiring oncology evaluation.  Consultants:   Oncology Procedures: None   Antimicrobials:  Anti-infectives (From admission, onward)    None       Subjective: Patient was seen and examined at bedside.  Overnight events noted.   Patient reports remains in a lot of pain. Patient appears chronically ill looking,  states she is having severe pain in the back.  Objective: Vitals:   09/27/21 0015 09/27/21 0235 09/27/21 0613 09/27/21 0921  BP: 108/60 (!) 109/91 123/79 124/66  Pulse:  90 86 (!) 47  Resp: _0 Temp:  98.7 F (37.1 C) 98.2 F (36.8 C) 98.4 F (36.9 C)  TempSrc:  Oral Oral Oral  SpO2:  93% 94% 93%    Intake/Output Summary (Last 24 hours) at 09/27/2021 1311 Last data filed at 09/27/2021 1002 Gross per 24 hour  Intake 1120 ml  Output --  Net 1120 ml   There were no vitals filed for this visit.  Examination:  General exam: Appears comfortable, chronically ill looking, not in any distress. Respiratory system: Clear to auscultation bilaterally. Respiratory effort normal. Cardiovascular system: S1 & S2  heard, regular rate and rhythm, no murmur. Gastrointestinal system: Abdomen is soft, nontender, nondistended, BS+ Central nervous system: Alert and oriented  X 3. No focal neurological deficits. Extremities: No edema, no cyanosis, no  clubbing. Skin: No rashes, lesions or ulcers Psychiatry: Judgement and insight appear normal. Mood & affect appropriate.     Data Reviewed: I have personally reviewed following labs and imaging studies  CBC: Recent Labs  Lab 09/26/21 1553 09/27/21 0501  WBC 11.6* 11.5*  NEUTROABS 6.8  --   HGB 9.5* 8.5*  HCT 30.3* 27.1*  MCV 87.3 88.3  PLT 307 737   Basic Metabolic Panel: Recent Labs  Lab 09/26/21 1553 09/26/21 2143 09/27/21 0501  NA 137  --  140  K 3.7  --  3.5  CL 101  --  106  CO2 27  --  26  GLUCOSE 94  --  125*  BUN 29*  --  25*  CREATININE 1.42*  --  1.21*  CALCIUM 11.6*  --  10.9*  MG  --  2.3 2.2  PHOS  --  3.4 3.7   GFR: CrCl cannot be calculated (Unknown ideal weight.). Liver Function Tests: Recent Labs  Lab 09/26/21 1553 09/27/21 0501  AST 35 30  ALT 12 10  ALKPHOS 137* 111  BILITOT 0.8 0.6  PROT 6.5 5.6*  ALBUMIN 3.4* 2.8*   No results for input(s): LIPASE, AMYLASE in the last 168 hours. No results for input(s): AMMONIA in the last 168 hours. Coagulation Profile: No results for input(s): INR, PROTIME in the last 168 hours. Cardiac Enzymes: No results for input(s): CKTOTAL, CKMB, CKMBINDEX, TROPONINI in the last 168 hours. BNP (last 3 results) No results for input(s): PROBNP in the last 8760 hours. HbA1C: No results for input(s): HGBA1C in the last 72 hours. CBG: No results for input(s): GLUCAP in the last 168 hours. Lipid Profile: No results for input(s): CHOL, HDL, LDLCALC, TRIG, CHOLHDL, LDLDIRECT in the last 72 hours. Thyroid Function Tests: No results for input(s): TSH, T4TOTAL, FREET4, T3FREE, THYROIDAB in the last 72 hours. Anemia Panel: No results for input(s): VITAMINB12, FOLATE, FERRITIN, TIBC, IRON, RETICCTPCT in the last 72 hours. Sepsis Labs: No results for input(s): PROCALCITON, LATICACIDVEN in the last 168 hours.  Recent Results (from the past 240 hour(s))  Resp Panel by RT-PCR (Flu A&B, Covid) Nasopharyngeal Swab      Status: None   Collection Time: 09/26/21  3:52 PM   Specimen: Nasopharyngeal Swab; Nasopharyngeal(NP) swabs in vial transport medium  Result Value Ref Range Status   SARS Coronavirus 2 by RT PCR NEGATIVE NEGATIVE Final    Comment: (NOTE) SARS-CoV-2 target nucleic acids are NOT DETECTED.  The SARS-CoV-2 RNA is generally detectable in upper respiratory specimens during the acute phase of infection. The lowest concentration of SARS-CoV-2 viral copies this assay can detect is 138 copies/mL. A negative result does not preclude SARS-Cov-2 infection and should not be used as the sole basis for treatment or other patient management decisions. A negative result may occur with  improper specimen collection/handling, submission of specimen other than nasopharyngeal swab, presence of viral mutation(s) within the areas targeted by this assay, and inadequate number of viral copies(<138 copies/mL). A negative result must be combined with clinical observations, patient history, and epidemiological information. The expected result is Negative.  Fact Sheet for Patients:  EntrepreneurPulse.com.au  Fact Sheet for Healthcare Providers:  IncredibleEmployment.be  This test is no t yet approved or cleared by the [place] FDA and  has been  authorized for detection and/or diagnosis of SARS-CoV-2 by FDA under an Emergency Use Authorization (EUA). This EUA will remain  in effect (meaning this test can be used) for the duration of the COVID-19 declaration under Section 564(b)(1) of the Act, 21 U.S.C.section 360bbb-3(b)(1), unless the authorization is terminated  or revoked sooner.       Influenza A by PCR NEGATIVE NEGATIVE Final   Influenza B by PCR NEGATIVE NEGATIVE Final    Comment: (NOTE) The Xpert Xpress SARS-CoV-2/FLU/RSV plus assay is intended as an aid in the diagnosis of influenza from Nasopharyngeal swab specimens and should not be used as a sole basis  for treatment. Nasal washings and aspirates are unacceptable for Xpert Xpress SARS-CoV-2/FLU/RSV testing.  Fact Sheet for Patients: EntrepreneurPulse.com.au  Fact Sheet for Healthcare Providers: IncredibleEmployment.be  This test is not yet approved or cleared by the [place] FDA and has been authorized for detection and/or diagnosis of SARS-CoV-2 by FDA under an Emergency Use Authorization (EUA). This EUA will remain in effect (meaning this test can be used) for the duration of the COVID-19 declaration under Section 564(b)(1) of the Act, 21 U.S.C. section 360bbb-3(b)(1), unless the authorization is terminated or revoked.  Performed at [facility], [place] [address] [place], [place] [address]      Radiology Studies: CT ABDOMEN PELVIS WO CONTRAST  Result Date: 09/26/2021 CLINICAL DATA:  Status post fall. Recent diagnosis cervical vertebrae fracture and cancer unknown primary Unable to walk well, change in mental status EXAM: CT CHEST, ABDOMEN AND PELVIS WITHOUT CONTRAST TECHNIQUE: Multidetector CT imaging of the chest, abdomen and pelvis was performed following the standard protocol without IV contrast. COMPARISON:  CT chest abdomen pelvis 09/18/2016. FINDINGS: CHEST: Ports and Devices: None. Lungs/airways: Likely atelectasis of the inferior right upper lobe. No focal consolidation. Subpleural 0.8 x 0.7 and 0.4 x 0.3 cm nodules at the right base (3:103). No pulmonary mass. No pulmonary contusion or laceration. No pneumatocele formation. The central airways are patent. Pleura: Trace bilateral pleural effusions. No pneumothorax. No hemothorax. Lymph Nodes: Limited evaluation for hilar lymphadenopathy on this noncontrast study. No mediastinal lymphadenopathy. Right axillary lymphadenopathy: 1.7 cm (2:14). No left axillary adenopathy. Mediastinum: No pneumomediastinum. The thoracic aorta is normal in caliber. At least moderate  atherosclerotic plaque. The heart is normal in size. No significant pericardial effusion. The esophagus is unremarkable. The thyroid is unremarkable. Chest Wall / Breasts: Right breast mass measuring 3.2 x 2.6 cm (2:33). Musculoskeletal: Diffusely decreased bone density with question underlying diffuse lytic lesions involving both the cortex and the medulla of the axial and appendicular skeleton.No definite acute displaced rib or sternal fracture. Bilateral multiple subacute to chronic rib fractures. Please see separately dictated CT thoracolumbar spine 09/26/2021. ABDOMEN / PELVIS: Liver: Not enlarged. No focal lesion. Biliary System: The gallbladder is otherwise unremarkable with no radio-opaque gallstones. No biliary ductal dilatation. Pancreas: Normal pancreatic contour. No main pancreatic duct dilatation. Spleen: Not enlarged. No focal lesion. Adrenal Glands: A 1.5 cm lesion with a density of 2 Hounsfield units within the right adrenal gland likely represents an adenoma. No left adrenal nodularity. Kidneys: No hydroureteronephrosis. No nephroureterolithiasis. No contour deforming renal mass. The urinary bladder is unremarkable. Bowel: No small or large bowel wall thickening or dilatation. Diffuse colonic diverticulosis. The appendix is unremarkable. Mesentery, Omentum, and Peritoneum: No simple free fluid ascites. No pneumoperitoneum. No mesenteric hematoma identified. No organized fluid collection. Pelvic Organs: Not well visualized due to streak artifact originating from bilateral femoral surgical hardware. Likely hysterectomy. Lymph  Nodes: No abdominal, pelvic, inguinal lymphadenopathy. Vasculature: No abdominal aorta or iliac aneurysm. Musculoskeletal: No significant soft tissue hematoma. There is a superior right of midline superficial back soft tissue density lesion measuring 3 x 1.5 cm. Diffusely decreased bone density with question underlying diffuse lytic lesions involving both the cortex and the  medulla of the axial and appendicular skeleton. Bilateral total hip arthroplasty. No definite acute displaced pelvic fracture. Please see separately dictated CT thoracolumbar spine 09/26/2021. IMPRESSION: 1. No acute traumatic injury to the chest, abdomen, or pelvis with markedly limited evaluation on this noncontrast study. 2. No acute displaced rib fracture. Bilateral multiple subacute to chronic rib fractures. 3. Please see separately dictated CT thoracolumbar spine 09/26/2021. Other imaging findings of potential clinical significance: 1. A 3.2 x 2.6 cm right breast mass as well as right axilla lymphadenopathy suggestive of malignancy. Correlate with diagnostic mammography. If possible, please obtain diagnostic mammography appointment for next business day. 2. Diffusely decreased bone density with underlying diffuse lytic lesions of both the cortex and medulla of the axial and appendicular skeleton suggestive with metastases. 3. Indeterminate 0.8 x 0.7 and 0.4 x 0.3 cm right base subpleural nodules. 4. Nonspecific superior right of midline superficial back soft tissue density lesion measuring 3 x 1.5 cm. Correlate with physical exam. 5. Colonic diverticulosis with no acute diverticulitis. Electronically Signed   By: Iven Finn M.D.   On: 09/26/2021 18:07   DG Pelvis 1-2 Views  Result Date: 09/26/2021 CLINICAL DATA:  Left hip pain after fall. EXAM: PELVIS - 1-2 VIEW COMPARISON:  Pelvis x-ray dated October 20, 2017. FINDINGS: Prior bilateral hip arthroplasties. No evidence of hardware failure or loosening. No acute fracture or dislocation. Osteopenia. Soft tissues are unremarkable. IMPRESSION: 1. No acute osseous abnormality. Electronically Signed   By: Titus Dubin M.D.   On: 09/26/2021 11:41   CT Head Wo Contrast  Result Date: 09/26/2021 CLINICAL DATA:  Dizziness, fall last night EXAM: CT HEAD WITHOUT CONTRAST TECHNIQUE: Contiguous axial images were obtained from the base of the skull through  the vertex without intravenous contrast. COMPARISON:  10/09/2019 FINDINGS: Brain: Small remote infarct of the right inferior cerebellum, unchanged. Remote infarcts involving the head of the left caudate nucleus and anterior limb of the left internal capsule likewise appear unchanged. Periventricular white matter and corona radiata hypodensities favor chronic ischemic microvascular white matter disease. Otherwise, the brainstem, cerebellum, cerebral peduncles, thalamus, basal ganglia, basilar cisterns, and ventricular system appear within normal limits. No intracranial hemorrhage, mass lesion, or acute CVA. Vascular: Unremarkable Skull: No acute findings. Small right frontal osteoma is only about 3 mm in short axis. Sinuses/Orbits: Mild chronic bilateral maxillary sinusitis. Other: No supplemental non-categorized findings. IMPRESSION: 1. No acute intracranial findings. 2. Small remote infarcts of the right inferior cerebellum and left caudate head/anterior limb left internal capsule. 3. Periventricular white matter and corona radiata hypodensities favor chronic ischemic microvascular white matter disease. 4. Mild chronic bilateral maxillary sinusitis. Electronically Signed   By: Van Clines M.D.   On: 09/26/2021 11:40   CT CHEST WO CONTRAST  Result Date: 09/26/2021 CLINICAL DATA:  Status post fall. Recent diagnosis cervical vertebrae fracture and cancer unknown primary Unable to walk well, change in mental status EXAM: CT CHEST, ABDOMEN AND PELVIS WITHOUT CONTRAST TECHNIQUE: Multidetector CT imaging of the chest, abdomen and pelvis was performed following the standard protocol without IV contrast. COMPARISON:  CT chest abdomen pelvis 09/18/2016. FINDINGS: CHEST: Ports and Devices: None. Lungs/airways: Likely atelectasis of the inferior right upper lobe.  No focal consolidation. Subpleural 0.8 x 0.7 and 0.4 x 0.3 cm nodules at the right base (3:103). No pulmonary mass. No pulmonary contusion or laceration.  No pneumatocele formation. The central airways are patent. Pleura: Trace bilateral pleural effusions. No pneumothorax. No hemothorax. Lymph Nodes: Limited evaluation for hilar lymphadenopathy on this noncontrast study. No mediastinal lymphadenopathy. Right axillary lymphadenopathy: 1.7 cm (2:14). No left axillary adenopathy. Mediastinum: No pneumomediastinum. The thoracic aorta is normal in caliber. At least moderate atherosclerotic plaque. The heart is normal in size. No significant pericardial effusion. The esophagus is unremarkable. The thyroid is unremarkable. Chest Wall / Breasts: Right breast mass measuring 3.2 x 2.6 cm (2:33). Musculoskeletal: Diffusely decreased bone density with question underlying diffuse lytic lesions involving both the cortex and the medulla of the axial and appendicular skeleton.No definite acute displaced rib or sternal fracture. Bilateral multiple subacute to chronic rib fractures. Please see separately dictated CT thoracolumbar spine 09/26/2021. ABDOMEN / PELVIS: Liver: Not enlarged. No focal lesion. Biliary System: The gallbladder is otherwise unremarkable with no radio-opaque gallstones. No biliary ductal dilatation. Pancreas: Normal pancreatic contour. No main pancreatic duct dilatation. Spleen: Not enlarged. No focal lesion. Adrenal Glands: A 1.5 cm lesion with a density of 2 Hounsfield units within the right adrenal gland likely represents an adenoma. No left adrenal nodularity. Kidneys: No hydroureteronephrosis. No nephroureterolithiasis. No contour deforming renal mass. The urinary bladder is unremarkable. Bowel: No small or large bowel wall thickening or dilatation. Diffuse colonic diverticulosis. The appendix is unremarkable. Mesentery, Omentum, and Peritoneum: No simple free fluid ascites. No pneumoperitoneum. No mesenteric hematoma identified. No organized fluid collection. Pelvic Organs: Not well visualized due to streak artifact originating from bilateral femoral  surgical hardware. Likely hysterectomy. Lymph Nodes: No abdominal, pelvic, inguinal lymphadenopathy. Vasculature: No abdominal aorta or iliac aneurysm. Musculoskeletal: No significant soft tissue hematoma. There is a superior right of midline superficial back soft tissue density lesion measuring 3 x 1.5 cm. Diffusely decreased bone density with question underlying diffuse lytic lesions involving both the cortex and the medulla of the axial and appendicular skeleton. Bilateral total hip arthroplasty. No definite acute displaced pelvic fracture. Please see separately dictated CT thoracolumbar spine 09/26/2021. IMPRESSION: 1. No acute traumatic injury to the chest, abdomen, or pelvis with markedly limited evaluation on this noncontrast study. 2. No acute displaced rib fracture. Bilateral multiple subacute to chronic rib fractures. 3. Please see separately dictated CT thoracolumbar spine 09/26/2021. Other imaging findings of potential clinical significance: 1. A 3.2 x 2.6 cm right breast mass as well as right axilla lymphadenopathy suggestive of malignancy. Correlate with diagnostic mammography. If possible, please obtain diagnostic mammography appointment for next business day. 2. Diffusely decreased bone density with underlying diffuse lytic lesions of both the cortex and medulla of the axial and appendicular skeleton suggestive with metastases. 3. Indeterminate 0.8 x 0.7 and 0.4 x 0.3 cm right base subpleural nodules. 4. Nonspecific superior right of midline superficial back soft tissue density lesion measuring 3 x 1.5 cm. Correlate with physical exam. 5. Colonic diverticulosis with no acute diverticulitis. Electronically Signed   By: Iven Finn M.D.   On: 09/26/2021 18:07   CT T-SPINE NO CHARGE  Result Date: 09/26/2021 CLINICAL DATA:  Recent diagnosis cervical vertebrae fracture and cancer unknown primary. Unable to walk well, change in mental status EXAM: CT THORACIC AND LUMBAR SPINE WITHOUT CONTRAST  TECHNIQUE: Multidetector CT imaging of the thoracic and lumbar spine was performed without contrast. Multiplanar CT image reconstructions were also generated. COMPARISON:  None. FINDINGS:  CT THORACIC SPINE FINDINGS Alignment: Normal Vertebrae: Extensive and widespread lytic lesions throughout the entire thoracic spine. Similar lesions also present in multiple ribs. Probable myeloma Compression fractures of T11 and T12. Bony retropulsion of the T12 fracture into the canal causing moderate spinal stenosis. This appears to be a pathologic fracture. Fracture of the left second rib anteriorly may be acute. Paraspinal and other soft tissues: Mild atherosclerotic calcification aortic arch. Pulmonary artery enlargement. Mild pleural thickening bilaterally inferiorly. Visualized lungs are clear without infiltrate or mass. Disc levels: Disc degeneration throughout the thoracic spine. No significant spinal stenosis. CT LUMBAR SPINE FINDINGS Segmentation: 5 lumbar segments. Alignment: Normal Vertebrae: Negative for lumbar fracture. Widespread lytic lesions throughout the lumbar vertebra similar to that seen in the thoracic spine. Paraspinal and other soft tissues: Atherosclerotic calcification aorta and iliac arteries. No aneurysm. Mild paraspinous edema around the T12 fracture. No retroperitoneal adenopathy. Disc levels: L1-2: Moderate spinal stenosis with disc and facet degeneration L2-3: Mild to moderate spinal stenosis due to disc bulging and facet hypertrophy L3-4: Mild spinal stenosis due to disc and facet degeneration. L4-5: Mild spinal stenosis due to disc and facet degeneration L5-S1: Bilateral facet hypertrophy and disc bulging. Mild subarticular stenosis bilaterally IMPRESSION: CT THORACIC SPINE IMPRESSION 1. Widespread lytic lesions throughout the thoracic spine compatible with multiple myeloma. Fractures of T11 and T12. Bony retropulsion of the T12 fracture into the canal causing moderate spinal stenosis. 2.  Fracture of the left second rib anteriorly may be acute. Multiple rib lesions compatible with myeloma. CT LUMBAR SPINE IMPRESSION 1. Widespread lytic lesions throughout the lumbar spine compatible with myeloma. No fracture. 2. Multilevel lumbar degenerative change. Moderate spinal stenosis L1-2. Electronically Signed   By: Franchot Gallo M.D.   On: 09/26/2021 18:04   CT L-SPINE NO CHARGE  Result Date: 09/26/2021 CLINICAL DATA:  Recent diagnosis cervical vertebrae fracture and cancer unknown primary. Unable to walk well, change in mental status EXAM: CT THORACIC AND LUMBAR SPINE WITHOUT CONTRAST TECHNIQUE: Multidetector CT imaging of the thoracic and lumbar spine was performed without contrast. Multiplanar CT image reconstructions were also generated. COMPARISON:  None. FINDINGS: CT THORACIC SPINE FINDINGS Alignment: Normal Vertebrae: Extensive and widespread lytic lesions throughout the entire thoracic spine. Similar lesions also present in multiple ribs. Probable myeloma Compression fractures of T11 and T12. Bony retropulsion of the T12 fracture into the canal causing moderate spinal stenosis. This appears to be a pathologic fracture. Fracture of the left second rib anteriorly may be acute. Paraspinal and other soft tissues: Mild atherosclerotic calcification aortic arch. Pulmonary artery enlargement. Mild pleural thickening bilaterally inferiorly. Visualized lungs are clear without infiltrate or mass. Disc levels: Disc degeneration throughout the thoracic spine. No significant spinal stenosis. CT LUMBAR SPINE FINDINGS Segmentation: 5 lumbar segments. Alignment: Normal Vertebrae: Negative for lumbar fracture. Widespread lytic lesions throughout the lumbar vertebra similar to that seen in the thoracic spine. Paraspinal and other soft tissues: Atherosclerotic calcification aorta and iliac arteries. No aneurysm. Mild paraspinous edema around the T12 fracture. No retroperitoneal adenopathy. Disc levels: L1-2:  Moderate spinal stenosis with disc and facet degeneration L2-3: Mild to moderate spinal stenosis due to disc bulging and facet hypertrophy L3-4: Mild spinal stenosis due to disc and facet degeneration. L4-5: Mild spinal stenosis due to disc and facet degeneration L5-S1: Bilateral facet hypertrophy and disc bulging. Mild subarticular stenosis bilaterally IMPRESSION: CT THORACIC SPINE IMPRESSION 1. Widespread lytic lesions throughout the thoracic spine compatible with multiple myeloma. Fractures of T11 and T12. Bony retropulsion of the T12  fracture into the canal causing moderate spinal stenosis. 2. Fracture of the left second rib anteriorly may be acute. Multiple rib lesions compatible with myeloma. CT LUMBAR SPINE IMPRESSION 1. Widespread lytic lesions throughout the lumbar spine compatible with myeloma. No fracture. 2. Multilevel lumbar degenerative change. Moderate spinal stenosis L1-2. Electronically Signed   By: Franchot Gallo M.D.   On: 09/26/2021 18:04    Scheduled Meds:  carvedilol  6.25 mg Oral BID WC   enoxaparin (LOVENOX) injection  40 mg Subcutaneous Q24H   feeding supplement  237 mL Oral BID BM   [START ON 09/28/2021] multivitamin with minerals  1 tablet Oral Daily   Continuous Infusions:  sodium chloride 50 mL/hr at 09/26/21 2146   zoledronic acid (ZOMETA) IV       LOS: 1 day    Time spent:  35 mins    Barbara Raether, MD [facility]   If 7PM-7AM, please contact night-coverage

## 2021-09-27 NOTE — Evaluation (Signed)
Physical Therapy Evaluation Patient Details Name: Barbara Kelley MRN: [id number] DOB: Dec 26, 1933 Today's Date: 09/27/2021  History of Present Illness  Patient is a 87 year old female who presented to the hosptial after two falls at home with generalized weakness and poor oral intake. patient was found to have R breast mass,  widespread bony lesions on spine, hypercalcemia, AKI, T11, T12 compression fractures and 2nd rib fracture. PMH: GERD, HTN, HFpEF, CAD, CVA, L TKA, h/o bilateral hip replacements.  Clinical Impression  Pt admitted with above diagnosis.  PT previously from [facility]F, mod I with rollator. Requiring mod assist of 2 for bed mobility d/t back pain with activity. Repositioned in bed in incr knee flexion, HOB elevated as tolerated  for pt to eat lunch.  Dtr present and encouraging pt to mobilize as much as tolerated.  Will likely need SNF post acute.   Pt currently with functional limitations due to the deficits listed below (see PT Problem List). Pt will benefit from skilled PT to increase their independence and safety with mobility to allow discharge to the venue listed below.          Recommendations for follow up therapy are one component of a multi-disciplinary discharge planning process, led by the attending physician.  Recommendations may be updated based on patient status, additional functional criteria and insurance authorization.  Follow Up Recommendations Skilled nursing-short term rehab (<3 hours/day)    Assistance Recommended at Discharge Frequent or constant Supervision/Assistance  Functional Status Assessment Patient has had a recent decline in their functional status and demonstrates the ability to make significant improvements in function in a reasonable and predictable amount of time.  Equipment Recommendations  Other (comment) (TBD)    Recommendations for Other Services       Precautions / Restrictions Precautions Precautions: Fall;Back Precaution Comments:  T11 and T12 fx, 2nd rib fx, multiple lytic  lesions spine Restrictions Weight Bearing Restrictions: No      Mobility  Bed Mobility Overal bed mobility: Needs Assistance Bed Mobility: Rolling;Sidelying to Sit;Sit to Sidelying Rolling: Min assist Sidelying to sit: Mod assist;Min assist;+2 for physical assistance;+2 for safety/equipment Supine to sit: Max assist   Sit to sidelying: Mod assist;+2 for physical assistance;+2 for safety/equipment General bed mobility comments: education on log roll techniques, pt able to initiate self assist trunk to upright with UEs; assist to complete trunk elevation from s/l, to bring LEs off bed. assist to control descent of trunk and to brign LEs on to bed    Transfers                   General transfer comment: deferred at pt request d/t pain    Ambulation/Gait                  Stairs            Wheelchair Mobility    Modified Rankin (Stroke Patients Only)       Balance Overall balance assessment: Needs assistance Sitting-balance support: Feet supported;Bilateral upper extremity supported Sitting balance-Leahy Scale: Fair Sitting balance - Comments: pt able to maintain midline with heavy reliance on UEs to un-weight spine for pain control, cues for anterior wt shift to maintain midline                                     Pertinent Vitals/Pain Pain Assessment: Faces Faces Pain Scale: Hurts whole lot Pain  Location: L "side" and  back Pain Descriptors / Indicators: Grimacing;Sore;Guarding Pain Intervention(s): Limited activity within patient's tolerance;Monitored during session;Premedicated before session;Repositioned    Home Living Family/patient expects to be discharged to:: Private residence Living Arrangements: Alone Available Help at Discharge: Family;Available PRN/intermittently Type of Home: Apartment Home Access: Level entry       Home Layout: One level Home Equipment: Rollator (4  wheels)      Prior Function Prior Level of Function : Independent/Modified Independent             Mobility Comments: resides at [facility]. has houskeeping assist. otherwise was independent with her rollator ADLs Comments: patient was living at [place]: Right    Extremity/Trunk Assessment   Upper Extremity Assessment Upper Extremity Assessment: Overall WFL for tasks assessed    Lower Extremity Assessment Lower Extremity Assessment: Generalized weakness    Cervical / Trunk Assessment Cervical / Trunk Assessment: Normal  Communication   Communication: No difficulties  Cognition Arousal/Alertness: Awake/alert Behavior During Therapy: WFL for tasks assessed/performed Overall Cognitive Status: Within Functional Limits for tasks assessed                                          General Comments      Exercises     Assessment/Plan    PT Assessment Patient needs continued PT services  PT Problem List Decreased strength;Decreased mobility;Decreased activity tolerance;Decreased balance;Decreased range of motion;Decreased knowledge of use of DME;Pain       PT Treatment Interventions DME instruction;Therapeutic activities;Gait training;Functional mobility training;Therapeutic exercise;Patient/family education;Balance training    PT Goals (Current goals can be found in the Care Plan section)  Acute Rehab PT Goals Patient Stated Goal: have less pain, likely go to rehab PT Goal Formulation: With patient/family Time For Goal Achievement: 10/11/21 Potential to Achieve Goals: Fair    Frequency Min 2X/week   Barriers to discharge        Co-evaluation               AM-PAC PT "6 Clicks" Mobility  Outcome Measure Help needed turning from your back to your side while in a flat bed without using bedrails?: Total Help needed moving from lying on your back to sitting on the side of a flat bed without  using bedrails?: Total Help needed moving to and from a bed to a chair (including a wheelchair)?: Total Help needed standing up from a chair using your arms (e.g., wheelchair or bedside chair)?: Total Help needed to walk in hospital room?: Total Help needed climbing 3-5 steps with a railing? : Total 6 Click Score: 6    End of Session   Activity Tolerance: Patient limited by pain Patient left: with call bell/phone within reach;in bed;with bed alarm set;with family/visitor present   PT Visit Diagnosis: Other abnormalities of gait and mobility (R26.89)    Time: 2836-6294 PT Time Calculation (min) (ACUTE ONLY): 25 min   Charges:   PT Evaluation $PT Eval Low Complexity: 1 Low PT Treatments $Therapeutic Activity: 8-22 mins        Baxter Flattery, PT  Acute Rehab Dept ([place]) [phone] Pager [phone]  09/27/2021   [facility] 09/27/2021, 4:19 PM

## 2021-09-27 NOTE — Consult Note (Signed)
[place] NOTE  Patient Care Team: [name], MD (Inactive) as PCP - General (Family Medicine) [name] [name], MD as PCP - Cardiology (Cardiology) [name], MD as Consulting Physician (Cardiology) Algie Coffer Mindi Slicker, RN as Oncology Nurse Navigator  CHIEF COMPLAINTS/PURPOSE OF CONSULTATION:  Newly diagnosed Bone lesions  HISTORY OF PRESENTING ILLNESS:  Barbara Kelley 87 y.o. female is admitted to the hospital with C/O lower extremity weakness and severe back pain that started in September. She saw ortho who performed scans including an MRI which showed findings suggestive of Metastatic cancer. She was supposed to see Dr.Sherill next week but she came in to the hospital with increased pain in the back. She lives at an independent living facility and her daughter was also in the room as we discussed her results and plan  I reviewed her records extensively and collaborated the history with the patient.   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 ; deficit of CVA in 2008   Pneumonia    x2 most recent fall of 2017    PONV (postoperative nausea and vomiting)    Stroke ([place]) 2008   deficit of neuroplathy in left hand and left foot     SURGICAL HISTORY: Past Surgical History:  Procedure Laterality Date   ABDOMINAL HYSTERECTOMY     APPENDECTOMY     BLADDER SUSPENSION     CARDIAC CATHETERIZATION  10/2015   CHOLECYSTECTOMY     HEMIARTHROPLASTY HIP Right 10/20/2017   HIP ARTHROPLASTY Right 10/19/2017   Procedure: ARTHROPLASTY BIPOLAR HIP (HEMIARTHROPLASTY);  Surgeon: Paralee Cancel, MD;  Location: [facility];  Service: Orthopedics;  Laterality: Right;   JOINT REPLACEMENT  Left 2013   left hip   TONSILLECTOMY     TOTAL KNEE ARTHROPLASTY Left 05/04/2017   Procedure: LEFT TOTAL KNEE ARTHROPLASTY;  Surgeon: Paralee Cancel, MD;  Location: [facility];  Service: Orthopedics;  Laterality: Left;  70 mins   TUBAL LIGATION      SOCIAL HISTORY: Social History   Socioeconomic History   Marital status: Widowed    Spouse name: Not on file   Number of children: Not on file   Years of education: Not on file   Highest education level: Not on file  Occupational History   Not on file  Tobacco Use   Smoking status: Never   Smokeless tobacco: Never  Vaping Use   Vaping Use: Never used  Substance and Sexual Activity   Alcohol use: No   Drug use: No   Sexual activity: Not on file  Other Topics Concern   Not on file  Social History Narrative   Not on file   Social Determinants of Health   Financial Resource Strain: Not on file  Food Insecurity: Not on file  Transportation Needs: Not on file  Physical Activity: Not on file  Stress: Not on file  Social Connections: Not on file  Intimate Partner Violence: Not on file    FAMILY HISTORY: Family History  Problem Relation Age of Onset   Diabetes Mother    Ovarian cancer Mother    Lung cancer Father  Diabetes Sister    Stroke Maternal Grandfather     ALLERGIES:  is allergic to doxycycline, macrobid [nitrofurantoin], valsartan, lisinopril, calcium channel blockers, and penicillins.  MEDICATIONS:  Current Facility-Administered Medications  Medication Dose Route Frequency Provider Last Rate Last Admin   0.9 %  sodium chloride infusion   Intravenous Continuous Doran Heater, DO 50 mL/hr at 09/26/21 2146 New Bag at 09/26/21 2146   acetaminophen (TYLENOL) tablet 650 mg  650 mg Oral Q6H PRN Doran Heater, DO   650 mg at 09/27/21 1213   Or   acetaminophen (TYLENOL) suppository 650 mg  650 mg Rectal Q6H PRN Doran Heater, DO       albuterol (PROVENTIL) (2.5 MG/3ML) 0.083% nebulizer solution 2.5 mg  2.5  mg Nebulization Q6H PRN MacNeil, Richard G, DO       carvedilol (COREG) tablet 6.25 mg  6.25 mg Oral BID WC MacNeil, Richard G, DO   6.25 mg at 09/27/21 1046   enoxaparin (LOVENOX) injection 40 mg  40 mg Subcutaneous Q24H MacNeil, Richard G, DO   40 mg at 09/27/21 1049   feeding supplement (ENSURE ENLIVE / ENSURE PLUS) liquid 237 mL  237 mL Oral BID BM Shawna Clamp, MD       Derrill Memo ON 09/28/2021] multivitamin with minerals tablet 1 tablet  1 tablet Oral Daily Shawna Clamp, MD       ondansetron (ZOFRAN) tablet 4 mg  4 mg Oral Q6H PRN Luna Fuse, Richard G, DO       Or   ondansetron (ZOFRAN) injection 4 mg  4 mg Intravenous Q6H PRN MacNeil, Richard G, DO       oxyCODONE (Oxy IR/ROXICODONE) immediate release tablet 5 mg  5 mg Oral Q4H PRN Doran Heater, DO   5 mg at 09/27/21 1047   zoledronic acid (ZOMETA) 4 mg in sodium chloride 0.9 % 100 mL IVPB  4 mg Intravenous Once Nicholas Lose, MD        REVIEW OF SYSTEMS:   Constitutional: Denies fevers, chills or abnormal night sweats Eyes: Denies blurriness of vision, double vision or watery eyes Ears, nose, mouth, throat, and face: Denies mucositis or sore throat Respiratory: Denies cough, dyspnea or wheezes Cardiovascular: Denies palpitation, chest discomfort or lower extremity swelling Gastrointestinal:  Denies nausea, heartburn or change in bowel habits Skin: Denies abnormal skin rashes Lymphatics: Denies new lymphadenopathy or easy bruising Neurological:back pain and LE weakness Behavioral/Psych: Mood is stable, no new changes  All other systems were reviewed with the patient and are negative.  PHYSICAL EXAMINATION: ECOG PERFORMANCE STATUS: 3 - Symptomatic, >50% confined to bed  Vitals:   09/27/21 0921 09/27/21 1314  BP: 124/66 129/64  Pulse: (!) 47 91  Resp: 18 (!) 21  Temp: 98.4 F (36.9 C) 98.5 F (36.9 C)  SpO2: 93% 92%   There were no vitals filed for this visit.  GENERAL:alert, no distress and comfortable SKIN: skin  color, texture, turgor are normal, no rashes or significant lesions EYES: normal, conjunctiva are pink and non-injected, sclera clear OROPHARYNX:no exudate, no erythema and lips, buccal mucosa, and tongue normal  NECK: supple, thyroid normal size, non-tender, without nodularity LYMPH:  no palpable lymphadenopathy in the cervical, axillary or inguinal LUNGS: clear to auscultation and percussion with normal breathing effort HEART: regular rate & rhythm and no murmurs and no lower extremity edema ABDOMEN:abdomen soft, non-tender and normal bowel sounds Musculoskeletal:no cyanosis of digits and no clubbing  PSYCH: alert & oriented x 3 with fluent speech  NEURO: LE weakness   LABORATORY DATA:  I have reviewed the data as listed Lab Results  Component Value Date   WBC 11.5 (H) 09/27/2021   HGB 8.5 (L) 09/27/2021   HCT 27.1 (L) 09/27/2021   MCV 88.3 09/27/2021   PLT 288 09/27/2021   Lab Results  Component Value Date   NA 140 09/27/2021   K 3.5 09/27/2021   CL 106 09/27/2021   CO2 26 09/27/2021    RADIOGRAPHIC STUDIES: I have personally reviewed the radiological reports and agreed with the findings in the report.  ASSESSMENT AND PLAN:  Bone lesions: CT T and L spine showing widespread lytic lesions with compression fractures of T 11 and T 12. Bone retropulsion, Rib fractures; CT CAP showed a breast mass in Right breast with Axillary LN. Counseling: I reviewed the images with the patients daughter. The bones in the spine appear to be like swiss cheese with extensive lesions. Differential: Myeloma Vs Met breast cancer Vs Both If she has both myeloma and breast cancer, she does not wish to receive any treatments because even if we are able to manage one cancer, she could succumb to the other (SPEP Pending) If the SPEP is negative, we can send for a biopsy of the breast or axilla. If shes ER positive MBC, she may be able to be treated with Anti-estrogen therapy. Recommend Rad Onc referral  and Palliative care referrals. She was open for palliative XRT Hypercalcemia: due to bone lesions.Zometa today.  All questions were answered. The patient knows to call the clinic with any problems, questions or concerns.  Dr.Mohamed will follow from tomorrow    Harriette Ohara, MD _0 @

## 2021-09-27 NOTE — Evaluation (Signed)
Occupational Therapy Evaluation Patient Details Name: Barbara Kelley MRN: [id number] DOB: 08/26/1934 Today's Date: 09/27/2021   History of Present Illness Patient is a 87 year old female who presented to the hosptial after two falls at home with generalized weakness and poor oral intake. patient was found to have widespread bony lesions on spine, hypercalcemia, AKI, T11, T12 compression fractures and 2nd rib fracture. PMH: GERD, HTN, HFpEF, CAD, L TKA, h/o bilateral hip replacements.   Clinical Impression   Patient is a 87 year old female who was living alone at [facility]F at rollator level prior to hospital admission. Currently, patient is mod A for rolling in bed with max A for attempted sitting EOB with pain in L hip impacting sitting up on edge of bed. Patient's pain, decreased functional activity tolerance, fear of falling, decreased sitting balance, decreased endurance and decreased knowledge of AD/AE is impacting participation in ADLs. Patient would continue to benefit from skilled OT services at this time while admitted and after d/c to address noted deficits in order to improve overall safety and independence in ADLs.     Recommendations for follow up therapy are one component of a multi-disciplinary discharge planning process, led by the attending physician.  Recommendations may be updated based on patient status, additional functional criteria and insurance authorization.   Follow Up Recommendations  Skilled nursing-short term rehab (<3 hours/day)    Assistance Recommended at Discharge Frequent or constant Supervision/Assistance  Functional Status Assessment  Patient has had a recent decline in their functional status and demonstrates the ability to make significant improvements in function in a reasonable and predictable amount of time.  Equipment Recommendations  Other (comment) (defer to next venue)    Recommendations for Other Services       Precautions / Restrictions  Precautions Precautions: Fall Precaution Comments: T11 and T12 fx, 2nd rib fx Restrictions Weight Bearing Restrictions: No      Mobility Bed Mobility Overal bed mobility: Needs Assistance Bed Mobility: Rolling;Supine to Sit Rolling: Min assist   Supine to sit: Max assist     General bed mobility comments: with education on log rolling. Max A  unable to sit midline on edge of bed.    Transfers                          Balance Overall balance assessment: Needs assistance   Sitting balance-Leahy Scale: Poor                                     ADL either performed or assessed with clinical judgement   ADL Overall ADL's : Needs assistance/impaired Eating/Feeding: Set up;Sitting;Bed level   Grooming: Wash/dry face;Oral care;Sitting;Bed level Grooming Details (indicate cue type and reason): in bed unable to tolerate sitting on edge of bed on this date. Upper Body Bathing: Bed level;Minimal assistance   Lower Body Bathing: Bed level;Total assistance   Upper Body Dressing : Bed level;Minimal assistance   Lower Body Dressing: Bed level;Total assistance   Toilet Transfer: +2 for physical assistance;+2 for safety/equipment Toilet Transfer Details (indicate cue type and reason): patient unable to transition into sitting at midline on edge of bed with increased time. strong lateral lean to R Toileting- Clothing Manipulation and Hygiene: Total assistance;Bed level       Functional mobility during ADLs: +2 for safety/equipment;+2 for physical assistance       Vision  Baseline Vision/History: 1 Wears glasses Ability to See in Adequate Light: 1 Impaired Patient Visual Report: No change from baseline       Perception     Praxis      Pertinent Vitals/Pain Pain Assessment: Faces Faces Pain Scale: Hurts whole lot Pain Location: L hip attempting to sit EOB Pain Descriptors / Indicators: Discomfort;Moaning;Grimacing;Guarding Pain Intervention(s):  Limited activity within patient's tolerance;Monitored during session;Premedicated before session;Repositioned     Hand Dominance Right   Extremity/Trunk Assessment Upper Extremity Assessment Upper Extremity Assessment: Overall WFL for tasks assessed   Lower Extremity Assessment Lower Extremity Assessment: Defer to PT evaluation   Cervical / Trunk Assessment Cervical / Trunk Assessment: Normal   Communication Communication Communication: No difficulties   Cognition Arousal/Alertness: Awake/alert Behavior During Therapy: WFL for tasks assessed/performed Overall Cognitive Status: Within Functional Limits for tasks assessed                                       General Comments       Exercises     Shoulder Instructions      Home Living Family/patient expects to be discharged to:: Private residence Living Arrangements: Alone Available Help at Discharge: Family;Available PRN/intermittently Type of Home: Apartment Home Access: Stairs to enter     Home Layout: One level     Bathroom Shower/Tub: Occupational psychologist: Standard     Home Equipment: Rollator (4 wheels)          Prior Functioning/Environment Prior Level of Function : Independent/Modified Independent               ADLs Comments: patient was living at [facility]        OT Problem List: Decreased strength;Decreased activity tolerance;Impaired balance (sitting and/or standing);Decreased safety awareness;Cardiopulmonary status limiting activity;Decreased knowledge of precautions;Decreased knowledge of use of DME or AE;Pain      OT Treatment/Interventions: Self-care/ADL training;Therapeutic exercise;Neuromuscular education;Energy conservation;DME and/or AE instruction;Therapeutic activities;Balance training;Patient/family education    OT Goals(Current goals can be found in the care plan section) Acute Rehab OT Goals Patient Stated Goal: to get back to ILF OT Goal  Formulation: With patient/family Time For Goal Achievement: 10/11/21 Potential to Achieve Goals: Good  OT Frequency: Min 2X/week   Barriers to D/C:    patient lives at [place] OT "6 Clicks" Daily Activity     Outcome Measure Help from another person eating meals?: A Little Help from another person taking care of personal grooming?: A Little Help from another person toileting, which includes using toliet, bedpan, or urinal?: A Lot Help from another person bathing (including washing, rinsing, drying)?: A Lot Help from another person to put on and taking off regular upper body clothing?: A Lot Help from another person to put on and taking off regular lower body clothing?: A Lot 6 Click Score: 14   End of Session Nurse Communication: Mobility status;Patient requests pain meds  Activity Tolerance: Patient limited by pain Patient left: in bed;with call bell/phone within reach;with family/visitor present;with bed alarm set  OT Visit Diagnosis: Unsteadiness on feet (R26.81);Muscle weakness (generalized) (M62.81);History of falling (Z91.81)                Time: 1093-2355 OT Time Calculation (min): 28 min Charges:  OT General Charges $OT Visit:  1 Visit OT Evaluation $OT Eval Moderate Complexity: 1 Mod OT Treatments $Self Care/Home Management : 8-22 mins  Jackelyn Poling OTR/L, MS Acute Rehabilitation Department Office# [phone] Pager# [phone]   Marcellina Millin 09/27/2021, 1:33 PM

## 2021-09-28 LAB — BASIC METABOLIC PANEL
Anion gap: 4 — ABNORMAL LOW (ref 5–15)
BUN: 20 mg/dL (ref 8–23)
CO2: 26 mmol/L (ref 22–32)
Calcium: 10.5 mg/dL — ABNORMAL HIGH (ref 8.9–10.3)
Chloride: 109 mmol/L (ref 98–111)
Creatinine, Ser: 1.03 mg/dL — ABNORMAL HIGH (ref 0.44–1.00)
GFR, Estimated: 53 mL/min — ABNORMAL LOW (ref >60)
Glucose, Bld: 101 mg/dL — ABNORMAL HIGH (ref 70–99)
Potassium: 3.7 mmol/L (ref 3.5–5.1)
Sodium: 139 mmol/L (ref 135–145)

## 2021-09-28 LAB — CBC
HCT: 27.6 % — ABNORMAL LOW (ref 36.0–46.0)
Hemoglobin: 8.6 g/dL — ABNORMAL LOW (ref 12.0–15.0)
MCH: 27.5 pg (ref 26.0–34.0)
MCHC: 31.2 g/dL (ref 30.0–36.0)
MCV: 88.2 fL (ref 80.0–100.0)
Platelets: 274 10*3/uL (ref 150–400)
RBC: 3.13 MIL/uL — ABNORMAL LOW (ref 3.87–5.11)
RDW: 16.6 % — ABNORMAL HIGH (ref 11.5–15.5)
WBC: 10.4 10*3/uL (ref 4.0–10.5)
nRBC: 0.3 % — ABNORMAL HIGH (ref 0.0–0.2)

## 2021-09-28 LAB — MAGNESIUM: Magnesium: 2 mg/dL (ref 1.7–2.4)

## 2021-09-28 LAB — PHOSPHORUS: Phosphorus: 2.7 mg/dL (ref 2.5–4.6)

## 2021-09-28 NOTE — Progress Notes (Signed)
PROGRESS NOTE    Barbara Kelley  HTD:[id number] DOB: 05/04/1934 DOA: 09/26/2021  PCP: [name], MD (Inactive)   Brief Narrative:  This 87 years old female with PMH significant for GERD, hypertension, CAD, HFpEF presented in the ED with c/o: generalized weakness, worsening chronic back pain and decreased p.o. intake.  Patient reports about a month ago she has developed back pain,  She has seen her PCP who has referred her to orthopedics after imaging it came out that she has compression fracture. She was considered to have kyphoplasty but MRI was obtained and found to have  lytic lesions.  She was referred to oncology and she was supposed to see next week however she has worsening back pain causing generalized weakness,  she was unable to get out of bed. She is found to have hypercalcemia, widespread bony lesions consistent with metastasis, oncology is consulted.  Myeloma labs were sent.  She also has a breast lesion.  If she has negative myeloma labs then she will probably need a biopsy of the breast mass and if it is hormonal sensitive then probably needs hormonal treatment.  Assessment & Plan:   Principal Problem:   Generalized weakness Active Problems:   History of CVA (cerebrovascular accident)   Chronic systolic heart failure (HCC)   AKI (acute kidney injury) (HCC)   Hypercalcemia   Lytic bone lesions on xray   Generalized weakness: Could be multifactorial, likely from Lytic vertebral bone lesions with resultant fractures and hypercalcemia. Continue IV hydration, fall precautions. PT and OT evaluation.  Metastatic bony lesions: Imaging showed widespread Lytic lesions throughout the spine concern for malignancy / myeloma. She was scheduled to see oncology next week.  Oncology is consulted. Follow-up SPEP, UPEP, free light chains. Continue adequate pain control with pain medications. Plan: If myeloma labs negative, She will need Breast biopsy or lymph node biopsy, if has  breast cancer hormone sensitive,  She will require hormonal treatment.  If myeloma labs positive she will probably need palliative care.  She does not want chemotherapy if she has both positive  Hypercalcemia: Serum calcium on admission 11.6. Likely secondary to lytic bone lesions. Follow-up ionized calcium, PTH, vitamin D. She is given Zometa, follow-up serum calcium. Hold on bisphosphonates. Continue gentle IV hydration. Serum calcium improving. 11.6>10.9>10.5  AKI: Suspect prerenal due to decreased p.o. intake and hypercalcemia Hold Entresto. Continue IV gentle hydration,  AKI improving. Continue to monitor serum creatinine.  Chronic HFrEF: Does not seem in acute exacerbation at this time.   Closely monitor because she is receiving IV hydration for hypercalcemia. We will hold on home Lasix for now.  Hold Entresto given AKI.  Essential hypertension: Continue beta-blocker.   DVT prophylaxis: Lovenox Code Status: Full code Family Communication: Daughter at bedside Disposition Plan:   Status is: Inpatient  Remains inpatient appropriate because:  Admitted for hypercalcemia requiring IV hydration, metastatic bone lesions requiring oncology evaluation.  Consultants:   Oncology Procedures: None   Antimicrobials:  Anti-infectives (From admission, onward)    None       Subjective: Patient was seen and examined at bedside.  Overnight events noted.   Patient appears very deconditioned, reports feeling better.  She reports having pain in the left lower chest,  States she is having severe pain in the back.  Objective: Vitals:   09/27/21 0921 09/27/21 1314 09/27/21 2006 09/28/21 0528  BP: 124/66 129/64 (!) 152/79 (!) 151/89  Pulse: (!) 47 91 100 100  Resp: 18 (!) 21 18 14  Temp: 98.4 F (36.9 C) 98.5 F (36.9 C) 98.6 F (37 C) 98.9 F (37.2 C)  TempSrc: Oral Oral Oral Oral  SpO2: 93% 92% 96% 94%    Intake/Output Summary (Last 24 hours) at 09/28/2021 1153 Last  data filed at 09/27/2021 1947 Gross per 24 hour  Intake 620 ml  Output 200 ml  Net 420 ml   There were no vitals filed for this visit.  Examination:  General exam: Appears deconditioned, not in any acute distress.  Comfortable Respiratory system: Clear to auscultation bilaterally. Respiratory effort normal. RR 12 Cardiovascular system: S1 & S2 heard, regular rate and rhythm, no murmur. Chest: Left chest wall tenderness+. Gastrointestinal system: Abdomen is soft, nontender, nondistended, BS+ Central nervous system: Alert and oriented  X 3. No focal neurological deficits. Extremities: No edema, no cyanosis, no clubbing. Skin: No rashes, lesions or ulcers Psychiatry: Judgement and insight appear normal. Mood & affect appropriate.     Data Reviewed: I have personally reviewed following labs and imaging studies  CBC: Recent Labs  Lab 09/26/21 1553 09/27/21 0501 09/28/21 0531  WBC 11.6* 11.5* 10.4  NEUTROABS 6.8  --   --   HGB 9.5* 8.5* 8.6*  HCT 30.3* 27.1* 27.6*  MCV 87.3 88.3 88.2  PLT 307 288 500   Basic Metabolic Panel: Recent Labs  Lab 09/26/21 1553 09/26/21 2143 09/27/21 0501 09/28/21 0531  NA 137  --  140 139  K 3.7  --  3.5 3.7  CL 101  --  106 109  CO2 27  --  26 26  GLUCOSE 94  --  125* 101*  BUN 29*  --  25* 20  CREATININE 1.42*  --  1.21* 1.03*  CALCIUM 11.6*  --  10.9* 10.5*  MG  --  2.3 2.2 2.0  PHOS  --  3.4 3.7 2.7   GFR: CrCl cannot be calculated (Unknown ideal weight.). Liver Function Tests: Recent Labs  Lab 09/26/21 1553 09/27/21 0501  AST 35 30  ALT 12 10  ALKPHOS 137* 111  BILITOT 0.8 0.6  PROT 6.5 5.6*  ALBUMIN 3.4* 2.8*   No results for input(s): LIPASE, AMYLASE in the last 168 hours. No results for input(s): AMMONIA in the last 168 hours. Coagulation Profile: No results for input(s): INR, PROTIME in the last 168 hours. Cardiac Enzymes: No results for input(s): CKTOTAL, CKMB, CKMBINDEX, TROPONINI in the last 168 hours. BNP  (last 3 results) No results for input(s): PROBNP in the last 8760 hours. HbA1C: No results for input(s): HGBA1C in the last 72 hours. CBG: No results for input(s): GLUCAP in the last 168 hours. Lipid Profile: No results for input(s): CHOL, HDL, LDLCALC, TRIG, CHOLHDL, LDLDIRECT in the last 72 hours. Thyroid Function Tests: No results for input(s): TSH, T4TOTAL, FREET4, T3FREE, THYROIDAB in the last 72 hours. Anemia Panel: No results for input(s): VITAMINB12, FOLATE, FERRITIN, TIBC, IRON, RETICCTPCT in the last 72 hours. Sepsis Labs: No results for input(s): PROCALCITON, LATICACIDVEN in the last 168 hours.  Recent Results (from the past 240 hour(s))  Resp Panel by RT-PCR (Flu A&B, Covid) Nasopharyngeal Swab     Status: None   Collection Time: 09/26/21  3:52 PM   Specimen: Nasopharyngeal Swab; Nasopharyngeal(NP) swabs in vial transport medium  Result Value Ref Range Status   SARS Coronavirus 2 by RT PCR NEGATIVE NEGATIVE Final    Comment: (NOTE) SARS-CoV-2 target nucleic acids are NOT DETECTED.  The SARS-CoV-2 RNA is generally detectable in upper respiratory specimens during the acute  phase of infection. The lowest concentration of SARS-CoV-2 viral copies this assay can detect is 138 copies/mL. A negative result does not preclude SARS-Cov-2 infection and should not be used as the sole basis for treatment or other patient management decisions. A negative result may occur with  improper specimen collection/handling, submission of specimen other than nasopharyngeal swab, presence of viral mutation(s) within the areas targeted by this assay, and inadequate number of viral copies(<138 copies/mL). A negative result must be combined with clinical observations, patient history, and epidemiological information. The expected result is Negative.  Fact Sheet for Patients:  EntrepreneurPulse.com.au  Fact Sheet for Healthcare Providers:   IncredibleEmployment.be  This test is no t yet approved or cleared by the [place] FDA and  has been authorized for detection and/or diagnosis of SARS-CoV-2 by FDA under an Emergency Use Authorization (EUA). This EUA will remain  in effect (meaning this test can be used) for the duration of the COVID-19 declaration under Section 564(b)(1) of the Act, 21 U.S.C.section 360bbb-3(b)(1), unless the authorization is terminated  or revoked sooner.       Influenza A by PCR NEGATIVE NEGATIVE Final   Influenza B by PCR NEGATIVE NEGATIVE Final    Comment: (NOTE) The Xpert Xpress SARS-CoV-2/FLU/RSV plus assay is intended as an aid in the diagnosis of influenza from Nasopharyngeal swab specimens and should not be used as a sole basis for treatment. Nasal washings and aspirates are unacceptable for Xpert Xpress SARS-CoV-2/FLU/RSV testing.  Fact Sheet for Patients: EntrepreneurPulse.com.au  Fact Sheet for Healthcare Providers: IncredibleEmployment.be  This test is not yet approved or cleared by the [place] FDA and has been authorized for detection and/or diagnosis of SARS-CoV-2 by FDA under an Emergency Use Authorization (EUA). This EUA will remain in effect (meaning this test can be used) for the duration of the COVID-19 declaration under Section 564(b)(1) of the Act, 21 U.S.C. section 360bbb-3(b)(1), unless the authorization is terminated or revoked.  Performed at [facility], [place] [address], [place], [place] [address]      Radiology Studies: CT ABDOMEN PELVIS WO CONTRAST  Result Date: 09/26/2021 CLINICAL DATA:  Status post fall. Recent diagnosis cervical vertebrae fracture and cancer unknown primary Unable to walk well, change in mental status EXAM: CT CHEST, ABDOMEN AND PELVIS WITHOUT CONTRAST TECHNIQUE: Multidetector CT imaging of the chest, abdomen and pelvis was performed following the standard  protocol without IV contrast. COMPARISON:  CT chest abdomen pelvis 09/18/2016. FINDINGS: CHEST: Ports and Devices: None. Lungs/airways: Likely atelectasis of the inferior right upper lobe. No focal consolidation. Subpleural 0.8 x 0.7 and 0.4 x 0.3 cm nodules at the right base (3:103). No pulmonary mass. No pulmonary contusion or laceration. No pneumatocele formation. The central airways are patent. Pleura: Trace bilateral pleural effusions. No pneumothorax. No hemothorax. Lymph Nodes: Limited evaluation for hilar lymphadenopathy on this noncontrast study. No mediastinal lymphadenopathy. Right axillary lymphadenopathy: 1.7 cm (2:14). No left axillary adenopathy. Mediastinum: No pneumomediastinum. The thoracic aorta is normal in caliber. At least moderate atherosclerotic plaque. The heart is normal in size. No significant pericardial effusion. The esophagus is unremarkable. The thyroid is unremarkable. Chest Wall / Breasts: Right breast mass measuring 3.2 x 2.6 cm (2:33). Musculoskeletal: Diffusely decreased bone density with question underlying diffuse lytic lesions involving both the cortex and the medulla of the axial and appendicular skeleton.No definite acute displaced rib or sternal fracture. Bilateral multiple subacute to chronic rib fractures. Please see separately dictated CT thoracolumbar spine 09/26/2021. ABDOMEN / PELVIS: Liver: Not  enlarged. No focal lesion. Biliary System: The gallbladder is otherwise unremarkable with no radio-opaque gallstones. No biliary ductal dilatation. Pancreas: Normal pancreatic contour. No main pancreatic duct dilatation. Spleen: Not enlarged. No focal lesion. Adrenal Glands: A 1.5 cm lesion with a density of 2 Hounsfield units within the right adrenal gland likely represents an adenoma. No left adrenal nodularity. Kidneys: No hydroureteronephrosis. No nephroureterolithiasis. No contour deforming renal mass. The urinary bladder is unremarkable. Bowel: No small or large bowel  wall thickening or dilatation. Diffuse colonic diverticulosis. The appendix is unremarkable. Mesentery, Omentum, and Peritoneum: No simple free fluid ascites. No pneumoperitoneum. No mesenteric hematoma identified. No organized fluid collection. Pelvic Organs: Not well visualized due to streak artifact originating from bilateral femoral surgical hardware. Likely hysterectomy. Lymph Nodes: No abdominal, pelvic, inguinal lymphadenopathy. Vasculature: No abdominal aorta or iliac aneurysm. Musculoskeletal: No significant soft tissue hematoma. There is a superior right of midline superficial back soft tissue density lesion measuring 3 x 1.5 cm. Diffusely decreased bone density with question underlying diffuse lytic lesions involving both the cortex and the medulla of the axial and appendicular skeleton. Bilateral total hip arthroplasty. No definite acute displaced pelvic fracture. Please see separately dictated CT thoracolumbar spine 09/26/2021. IMPRESSION: 1. No acute traumatic injury to the chest, abdomen, or pelvis with markedly limited evaluation on this noncontrast study. 2. No acute displaced rib fracture. Bilateral multiple subacute to chronic rib fractures. 3. Please see separately dictated CT thoracolumbar spine 09/26/2021. Other imaging findings of potential clinical significance: 1. A 3.2 x 2.6 cm right breast mass as well as right axilla lymphadenopathy suggestive of malignancy. Correlate with diagnostic mammography. If possible, please obtain diagnostic mammography appointment for next business day. 2. Diffusely decreased bone density with underlying diffuse lytic lesions of both the cortex and medulla of the axial and appendicular skeleton suggestive with metastases. 3. Indeterminate 0.8 x 0.7 and 0.4 x 0.3 cm right base subpleural nodules. 4. Nonspecific superior right of midline superficial back soft tissue density lesion measuring 3 x 1.5 cm. Correlate with physical exam. 5. Colonic diverticulosis with  no acute diverticulitis. Electronically Signed   By: Iven Finn M.D.   On: 09/26/2021 18:07   CT CHEST WO CONTRAST  Result Date: 09/26/2021 CLINICAL DATA:  Status post fall. Recent diagnosis cervical vertebrae fracture and cancer unknown primary Unable to walk well, change in mental status EXAM: CT CHEST, ABDOMEN AND PELVIS WITHOUT CONTRAST TECHNIQUE: Multidetector CT imaging of the chest, abdomen and pelvis was performed following the standard protocol without IV contrast. COMPARISON:  CT chest abdomen pelvis 09/18/2016. FINDINGS: CHEST: Ports and Devices: None. Lungs/airways: Likely atelectasis of the inferior right upper lobe. No focal consolidation. Subpleural 0.8 x 0.7 and 0.4 x 0.3 cm nodules at the right base (3:103). No pulmonary mass. No pulmonary contusion or laceration. No pneumatocele formation. The central airways are patent. Pleura: Trace bilateral pleural effusions. No pneumothorax. No hemothorax. Lymph Nodes: Limited evaluation for hilar lymphadenopathy on this noncontrast study. No mediastinal lymphadenopathy. Right axillary lymphadenopathy: 1.7 cm (2:14). No left axillary adenopathy. Mediastinum: No pneumomediastinum. The thoracic aorta is normal in caliber. At least moderate atherosclerotic plaque. The heart is normal in size. No significant pericardial effusion. The esophagus is unremarkable. The thyroid is unremarkable. Chest Wall / Breasts: Right breast mass measuring 3.2 x 2.6 cm (2:33). Musculoskeletal: Diffusely decreased bone density with question underlying diffuse lytic lesions involving both the cortex and the medulla of the axial and appendicular skeleton.No definite acute displaced rib or sternal fracture. Bilateral multiple  subacute to chronic rib fractures. Please see separately dictated CT thoracolumbar spine 09/26/2021. ABDOMEN / PELVIS: Liver: Not enlarged. No focal lesion. Biliary System: The gallbladder is otherwise unremarkable with no radio-opaque gallstones. No  biliary ductal dilatation. Pancreas: Normal pancreatic contour. No main pancreatic duct dilatation. Spleen: Not enlarged. No focal lesion. Adrenal Glands: A 1.5 cm lesion with a density of 2 Hounsfield units within the right adrenal gland likely represents an adenoma. No left adrenal nodularity. Kidneys: No hydroureteronephrosis. No nephroureterolithiasis. No contour deforming renal mass. The urinary bladder is unremarkable. Bowel: No small or large bowel wall thickening or dilatation. Diffuse colonic diverticulosis. The appendix is unremarkable. Mesentery, Omentum, and Peritoneum: No simple free fluid ascites. No pneumoperitoneum. No mesenteric hematoma identified. No organized fluid collection. Pelvic Organs: Not well visualized due to streak artifact originating from bilateral femoral surgical hardware. Likely hysterectomy. Lymph Nodes: No abdominal, pelvic, inguinal lymphadenopathy. Vasculature: No abdominal aorta or iliac aneurysm. Musculoskeletal: No significant soft tissue hematoma. There is a superior right of midline superficial back soft tissue density lesion measuring 3 x 1.5 cm. Diffusely decreased bone density with question underlying diffuse lytic lesions involving both the cortex and the medulla of the axial and appendicular skeleton. Bilateral total hip arthroplasty. No definite acute displaced pelvic fracture. Please see separately dictated CT thoracolumbar spine 09/26/2021. IMPRESSION: 1. No acute traumatic injury to the chest, abdomen, or pelvis with markedly limited evaluation on this noncontrast study. 2. No acute displaced rib fracture. Bilateral multiple subacute to chronic rib fractures. 3. Please see separately dictated CT thoracolumbar spine 09/26/2021. Other imaging findings of potential clinical significance: 1. A 3.2 x 2.6 cm right breast mass as well as right axilla lymphadenopathy suggestive of malignancy. Correlate with diagnostic mammography. If possible, please obtain diagnostic  mammography appointment for next business day. 2. Diffusely decreased bone density with underlying diffuse lytic lesions of both the cortex and medulla of the axial and appendicular skeleton suggestive with metastases. 3. Indeterminate 0.8 x 0.7 and 0.4 x 0.3 cm right base subpleural nodules. 4. Nonspecific superior right of midline superficial back soft tissue density lesion measuring 3 x 1.5 cm. Correlate with physical exam. 5. Colonic diverticulosis with no acute diverticulitis. Electronically Signed   By: Iven Finn M.D.   On: 09/26/2021 18:07   CT T-SPINE NO CHARGE  Result Date: 09/26/2021 CLINICAL DATA:  Recent diagnosis cervical vertebrae fracture and cancer unknown primary. Unable to walk well, change in mental status EXAM: CT THORACIC AND LUMBAR SPINE WITHOUT CONTRAST TECHNIQUE: Multidetector CT imaging of the thoracic and lumbar spine was performed without contrast. Multiplanar CT image reconstructions were also generated. COMPARISON:  None. FINDINGS: CT THORACIC SPINE FINDINGS Alignment: Normal Vertebrae: Extensive and widespread lytic lesions throughout the entire thoracic spine. Similar lesions also present in multiple ribs. Probable myeloma Compression fractures of T11 and T12. Bony retropulsion of the T12 fracture into the canal causing moderate spinal stenosis. This appears to be a pathologic fracture. Fracture of the left second rib anteriorly may be acute. Paraspinal and other soft tissues: Mild atherosclerotic calcification aortic arch. Pulmonary artery enlargement. Mild pleural thickening bilaterally inferiorly. Visualized lungs are clear without infiltrate or mass. Disc levels: Disc degeneration throughout the thoracic spine. No significant spinal stenosis. CT LUMBAR SPINE FINDINGS Segmentation: 5 lumbar segments. Alignment: Normal Vertebrae: Negative for lumbar fracture. Widespread lytic lesions throughout the lumbar vertebra similar to that seen in the thoracic spine. Paraspinal and  other soft tissues: Atherosclerotic calcification aorta and iliac arteries. No aneurysm. Mild paraspinous edema  around the T12 fracture. No retroperitoneal adenopathy. Disc levels: L1-2: Moderate spinal stenosis with disc and facet degeneration L2-3: Mild to moderate spinal stenosis due to disc bulging and facet hypertrophy L3-4: Mild spinal stenosis due to disc and facet degeneration. L4-5: Mild spinal stenosis due to disc and facet degeneration L5-S1: Bilateral facet hypertrophy and disc bulging. Mild subarticular stenosis bilaterally IMPRESSION: CT THORACIC SPINE IMPRESSION 1. Widespread lytic lesions throughout the thoracic spine compatible with multiple myeloma. Fractures of T11 and T12. Bony retropulsion of the T12 fracture into the canal causing moderate spinal stenosis. 2. Fracture of the left second rib anteriorly may be acute. Multiple rib lesions compatible with myeloma. CT LUMBAR SPINE IMPRESSION 1. Widespread lytic lesions throughout the lumbar spine compatible with myeloma. No fracture. 2. Multilevel lumbar degenerative change. Moderate spinal stenosis L1-2. Electronically Signed   By: Franchot Gallo M.D.   On: 09/26/2021 18:04   CT L-SPINE NO CHARGE  Result Date: 09/26/2021 CLINICAL DATA:  Recent diagnosis cervical vertebrae fracture and cancer unknown primary. Unable to walk well, change in mental status EXAM: CT THORACIC AND LUMBAR SPINE WITHOUT CONTRAST TECHNIQUE: Multidetector CT imaging of the thoracic and lumbar spine was performed without contrast. Multiplanar CT image reconstructions were also generated. COMPARISON:  None. FINDINGS: CT THORACIC SPINE FINDINGS Alignment: Normal Vertebrae: Extensive and widespread lytic lesions throughout the entire thoracic spine. Similar lesions also present in multiple ribs. Probable myeloma Compression fractures of T11 and T12. Bony retropulsion of the T12 fracture into the canal causing moderate spinal stenosis. This appears to be a pathologic  fracture. Fracture of the left second rib anteriorly may be acute. Paraspinal and other soft tissues: Mild atherosclerotic calcification aortic arch. Pulmonary artery enlargement. Mild pleural thickening bilaterally inferiorly. Visualized lungs are clear without infiltrate or mass. Disc levels: Disc degeneration throughout the thoracic spine. No significant spinal stenosis. CT LUMBAR SPINE FINDINGS Segmentation: 5 lumbar segments. Alignment: Normal Vertebrae: Negative for lumbar fracture. Widespread lytic lesions throughout the lumbar vertebra similar to that seen in the thoracic spine. Paraspinal and other soft tissues: Atherosclerotic calcification aorta and iliac arteries. No aneurysm. Mild paraspinous edema around the T12 fracture. No retroperitoneal adenopathy. Disc levels: L1-2: Moderate spinal stenosis with disc and facet degeneration L2-3: Mild to moderate spinal stenosis due to disc bulging and facet hypertrophy L3-4: Mild spinal stenosis due to disc and facet degeneration. L4-5: Mild spinal stenosis due to disc and facet degeneration L5-S1: Bilateral facet hypertrophy and disc bulging. Mild subarticular stenosis bilaterally IMPRESSION: CT THORACIC SPINE IMPRESSION 1. Widespread lytic lesions throughout the thoracic spine compatible with multiple myeloma. Fractures of T11 and T12. Bony retropulsion of the T12 fracture into the canal causing moderate spinal stenosis. 2. Fracture of the left second rib anteriorly may be acute. Multiple rib lesions compatible with myeloma. CT LUMBAR SPINE IMPRESSION 1. Widespread lytic lesions throughout the lumbar spine compatible with myeloma. No fracture. 2. Multilevel lumbar degenerative change. Moderate spinal stenosis L1-2. Electronically Signed   By: Franchot Gallo M.D.   On: 09/26/2021 18:04    Scheduled Meds:  carvedilol  6.25 mg Oral BID WC   enoxaparin (LOVENOX) injection  40 mg Subcutaneous Q24H   feeding supplement  237 mL Oral BID BM   multivitamin with  minerals  1 tablet Oral Daily   Continuous Infusions:   LOS: 2 days    Time spent:  25 mins    Shawna Clamp, MD [facility]   If 7PM-7AM, please contact night-coverage

## 2021-09-28 NOTE — Progress Notes (Signed)
Subjective: The patient is seen and examined today.  She is feeling fine today with no concerning complaints except for intermittent pain at the left lower rib cage and epigastric area.  She denied having any current nausea, vomiting, diarrhea or constipation.  She denied having any headache or visual changes.  She has no chest pain but has shortness of breath at times with no cough or hemoptysis.  She has no fever or chills.  Objective: Vital signs in last 24 hours: Temp:  [98.5 F (36.9 C)-98.9 F (37.2 C)] 98.9 F (37.2 C) (12/25 0528) Pulse Rate:  [91-100] 100 (12/25 0528) Resp:  [14-21] 14 (12/25 0528) BP: (129-152)/(64-89) 151/89 (12/25 0528) SpO2:  [92 %-96 %] 94 % (12/25 0528)  Intake/Output from previous day: 12/24 0701 - 12/25 0700 In: 740 [P.O.:240; I.V.:500] Out: 200 [Urine:200] Intake/Output this shift: No intake/output data recorded.  General appearance: alert, cooperative, appears stated age, and fatigued Resp: clear to auscultation bilaterally Cardio: regular rate and rhythm, S1, S2 normal, no murmur, click, rub or gallop GI: soft, non-tender; bowel sounds normal; no masses,  no organomegaly Extremities: extremities normal, atraumatic, no cyanosis or edema  Lab Results:  Recent Labs    09/27/21 0501 09/28/21 0531  WBC 11.5* 10.4  HGB 8.5* 8.6*  HCT 27.1* 27.6*  PLT 288 274   BMET Recent Labs    09/27/21 0501 09/28/21 0531  NA 140 139  K 3.5 3.7  CL 106 109  CO2 26 26  GLUCOSE 125* 101*  BUN 25* 20  CREATININE 1.21* 1.03*  CALCIUM 10.9* 10.5*    Studies/Results: CT ABDOMEN PELVIS WO CONTRAST  Result Date: 09/26/2021 CLINICAL DATA:  Status post fall. Recent diagnosis cervical vertebrae fracture and cancer unknown primary Unable to walk well, change in mental status EXAM: CT CHEST, ABDOMEN AND PELVIS WITHOUT CONTRAST TECHNIQUE: Multidetector CT imaging of the chest, abdomen and pelvis was performed following the standard protocol without IV  contrast. COMPARISON:  CT chest abdomen pelvis 09/18/2016. FINDINGS: CHEST: Ports and Devices: None. Lungs/airways: Likely atelectasis of the inferior right upper lobe. No focal consolidation. Subpleural 0.8 x 0.7 and 0.4 x 0.3 cm nodules at the right base (3:103). No pulmonary mass. No pulmonary contusion or laceration. No pneumatocele formation. The central airways are patent. Pleura: Trace bilateral pleural effusions. No pneumothorax. No hemothorax. Lymph Nodes: Limited evaluation for hilar lymphadenopathy on this noncontrast study. No mediastinal lymphadenopathy. Right axillary lymphadenopathy: 1.7 cm (2:14). No left axillary adenopathy. Mediastinum: No pneumomediastinum. The thoracic aorta is normal in caliber. At least moderate atherosclerotic plaque. The heart is normal in size. No significant pericardial effusion. The esophagus is unremarkable. The thyroid is unremarkable. Chest Wall / Breasts: Right breast mass measuring 3.2 x 2.6 cm (2:33). Musculoskeletal: Diffusely decreased bone density with question underlying diffuse lytic lesions involving both the cortex and the medulla of the axial and appendicular skeleton.No definite acute displaced rib or sternal fracture. Bilateral multiple subacute to chronic rib fractures. Please see separately dictated CT thoracolumbar spine 09/26/2021. ABDOMEN / PELVIS: Liver: Not enlarged. No focal lesion. Biliary System: The gallbladder is otherwise unremarkable with no radio-opaque gallstones. No biliary ductal dilatation. Pancreas: Normal pancreatic contour. No main pancreatic duct dilatation. Spleen: Not enlarged. No focal lesion. Adrenal Glands: A 1.5 cm lesion with a density of 2 Hounsfield units within the right adrenal gland likely represents an adenoma. No left adrenal nodularity. Kidneys: No hydroureteronephrosis. No nephroureterolithiasis. No contour deforming renal mass. The urinary bladder is unremarkable. Bowel: No small or  large bowel wall thickening or  dilatation. Diffuse colonic diverticulosis. The appendix is unremarkable. Mesentery, Omentum, and Peritoneum: No simple free fluid ascites. No pneumoperitoneum. No mesenteric hematoma identified. No organized fluid collection. Pelvic Organs: Not well visualized due to streak artifact originating from bilateral femoral surgical hardware. Likely hysterectomy. Lymph Nodes: No abdominal, pelvic, inguinal lymphadenopathy. Vasculature: No abdominal aorta or iliac aneurysm. Musculoskeletal: No significant soft tissue hematoma. There is a superior right of midline superficial back soft tissue density lesion measuring 3 x 1.5 cm. Diffusely decreased bone density with question underlying diffuse lytic lesions involving both the cortex and the medulla of the axial and appendicular skeleton. Bilateral total hip arthroplasty. No definite acute displaced pelvic fracture. Please see separately dictated CT thoracolumbar spine 09/26/2021. IMPRESSION: 1. No acute traumatic injury to the chest, abdomen, or pelvis with markedly limited evaluation on this noncontrast study. 2. No acute displaced rib fracture. Bilateral multiple subacute to chronic rib fractures. 3. Please see separately dictated CT thoracolumbar spine 09/26/2021. Other imaging findings of potential clinical significance: 1. A 3.2 x 2.6 cm right breast mass as well as right axilla lymphadenopathy suggestive of malignancy. Correlate with diagnostic mammography. If possible, please obtain diagnostic mammography appointment for next business day. 2. Diffusely decreased bone density with underlying diffuse lytic lesions of both the cortex and medulla of the axial and appendicular skeleton suggestive with metastases. 3. Indeterminate 0.8 x 0.7 and 0.4 x 0.3 cm right base subpleural nodules. 4. Nonspecific superior right of midline superficial back soft tissue density lesion measuring 3 x 1.5 cm. Correlate with physical exam. 5. Colonic diverticulosis with no acute  diverticulitis. Electronically Signed   By: Iven Finn M.D.   On: 09/26/2021 18:07   DG Pelvis 1-2 Views  Result Date: 09/26/2021 CLINICAL DATA:  Left hip pain after fall. EXAM: PELVIS - 1-2 VIEW COMPARISON:  Pelvis x-ray dated October 20, 2017. FINDINGS: Prior bilateral hip arthroplasties. No evidence of hardware failure or loosening. No acute fracture or dislocation. Osteopenia. Soft tissues are unremarkable. IMPRESSION: 1. No acute osseous abnormality. Electronically Signed   By: Titus Dubin M.D.   On: 09/26/2021 11:41   CT Head Wo Contrast  Result Date: 09/26/2021 CLINICAL DATA:  Dizziness, fall last night EXAM: CT HEAD WITHOUT CONTRAST TECHNIQUE: Contiguous axial images were obtained from the base of the skull through the vertex without intravenous contrast. COMPARISON:  10/09/2019 FINDINGS: Brain: Small remote infarct of the right inferior cerebellum, unchanged. Remote infarcts involving the head of the left caudate nucleus and anterior limb of the left internal capsule likewise appear unchanged. Periventricular white matter and corona radiata hypodensities favor chronic ischemic microvascular white matter disease. Otherwise, the brainstem, cerebellum, cerebral peduncles, thalamus, basal ganglia, basilar cisterns, and ventricular system appear within normal limits. No intracranial hemorrhage, mass lesion, or acute CVA. Vascular: Unremarkable Skull: No acute findings. Small right frontal osteoma is only about 3 mm in short axis. Sinuses/Orbits: Mild chronic bilateral maxillary sinusitis. Other: No supplemental non-categorized findings. IMPRESSION: 1. No acute intracranial findings. 2. Small remote infarcts of the right inferior cerebellum and left caudate head/anterior limb left internal capsule. 3. Periventricular white matter and corona radiata hypodensities favor chronic ischemic microvascular white matter disease. 4. Mild chronic bilateral maxillary sinusitis. Electronically Signed   By:  Van Clines M.D.   On: 09/26/2021 11:40   CT CHEST WO CONTRAST  Result Date: 09/26/2021 CLINICAL DATA:  Status post fall. Recent diagnosis cervical vertebrae fracture and cancer unknown primary Unable to walk well, change in  mental status EXAM: CT CHEST, ABDOMEN AND PELVIS WITHOUT CONTRAST TECHNIQUE: Multidetector CT imaging of the chest, abdomen and pelvis was performed following the standard protocol without IV contrast. COMPARISON:  CT chest abdomen pelvis 09/18/2016. FINDINGS: CHEST: Ports and Devices: None. Lungs/airways: Likely atelectasis of the inferior right upper lobe. No focal consolidation. Subpleural 0.8 x 0.7 and 0.4 x 0.3 cm nodules at the right base (3:103). No pulmonary mass. No pulmonary contusion or laceration. No pneumatocele formation. The central airways are patent. Pleura: Trace bilateral pleural effusions. No pneumothorax. No hemothorax. Lymph Nodes: Limited evaluation for hilar lymphadenopathy on this noncontrast study. No mediastinal lymphadenopathy. Right axillary lymphadenopathy: 1.7 cm (2:14). No left axillary adenopathy. Mediastinum: No pneumomediastinum. The thoracic aorta is normal in caliber. At least moderate atherosclerotic plaque. The heart is normal in size. No significant pericardial effusion. The esophagus is unremarkable. The thyroid is unremarkable. Chest Wall / Breasts: Right breast mass measuring 3.2 x 2.6 cm (2:33). Musculoskeletal: Diffusely decreased bone density with question underlying diffuse lytic lesions involving both the cortex and the medulla of the axial and appendicular skeleton.No definite acute displaced rib or sternal fracture. Bilateral multiple subacute to chronic rib fractures. Please see separately dictated CT thoracolumbar spine 09/26/2021. ABDOMEN / PELVIS: Liver: Not enlarged. No focal lesion. Biliary System: The gallbladder is otherwise unremarkable with no radio-opaque gallstones. No biliary ductal dilatation. Pancreas: Normal pancreatic  contour. No main pancreatic duct dilatation. Spleen: Not enlarged. No focal lesion. Adrenal Glands: A 1.5 cm lesion with a density of 2 Hounsfield units within the right adrenal gland likely represents an adenoma. No left adrenal nodularity. Kidneys: No hydroureteronephrosis. No nephroureterolithiasis. No contour deforming renal mass. The urinary bladder is unremarkable. Bowel: No small or large bowel wall thickening or dilatation. Diffuse colonic diverticulosis. The appendix is unremarkable. Mesentery, Omentum, and Peritoneum: No simple free fluid ascites. No pneumoperitoneum. No mesenteric hematoma identified. No organized fluid collection. Pelvic Organs: Not well visualized due to streak artifact originating from bilateral femoral surgical hardware. Likely hysterectomy. Lymph Nodes: No abdominal, pelvic, inguinal lymphadenopathy. Vasculature: No abdominal aorta or iliac aneurysm. Musculoskeletal: No significant soft tissue hematoma. There is a superior right of midline superficial back soft tissue density lesion measuring 3 x 1.5 cm. Diffusely decreased bone density with question underlying diffuse lytic lesions involving both the cortex and the medulla of the axial and appendicular skeleton. Bilateral total hip arthroplasty. No definite acute displaced pelvic fracture. Please see separately dictated CT thoracolumbar spine 09/26/2021. IMPRESSION: 1. No acute traumatic injury to the chest, abdomen, or pelvis with markedly limited evaluation on this noncontrast study. 2. No acute displaced rib fracture. Bilateral multiple subacute to chronic rib fractures. 3. Please see separately dictated CT thoracolumbar spine 09/26/2021. Other imaging findings of potential clinical significance: 1. A 3.2 x 2.6 cm right breast mass as well as right axilla lymphadenopathy suggestive of malignancy. Correlate with diagnostic mammography. If possible, please obtain diagnostic mammography appointment for next business day. 2.  Diffusely decreased bone density with underlying diffuse lytic lesions of both the cortex and medulla of the axial and appendicular skeleton suggestive with metastases. 3. Indeterminate 0.8 x 0.7 and 0.4 x 0.3 cm right base subpleural nodules. 4. Nonspecific superior right of midline superficial back soft tissue density lesion measuring 3 x 1.5 cm. Correlate with physical exam. 5. Colonic diverticulosis with no acute diverticulitis. Electronically Signed   By: Iven Finn M.D.   On: 09/26/2021 18:07   CT T-SPINE NO CHARGE  Result Date: 09/26/2021 CLINICAL DATA:  Recent diagnosis cervical vertebrae fracture and cancer unknown primary. Unable to walk well, change in mental status EXAM: CT THORACIC AND LUMBAR SPINE WITHOUT CONTRAST TECHNIQUE: Multidetector CT imaging of the thoracic and lumbar spine was performed without contrast. Multiplanar CT image reconstructions were also generated. COMPARISON:  None. FINDINGS: CT THORACIC SPINE FINDINGS Alignment: Normal Vertebrae: Extensive and widespread lytic lesions throughout the entire thoracic spine. Similar lesions also present in multiple ribs. Probable myeloma Compression fractures of T11 and T12. Bony retropulsion of the T12 fracture into the canal causing moderate spinal stenosis. This appears to be a pathologic fracture. Fracture of the left second rib anteriorly may be acute. Paraspinal and other soft tissues: Mild atherosclerotic calcification aortic arch. Pulmonary artery enlargement. Mild pleural thickening bilaterally inferiorly. Visualized lungs are clear without infiltrate or mass. Disc levels: Disc degeneration throughout the thoracic spine. No significant spinal stenosis. CT LUMBAR SPINE FINDINGS Segmentation: 5 lumbar segments. Alignment: Normal Vertebrae: Negative for lumbar fracture. Widespread lytic lesions throughout the lumbar vertebra similar to that seen in the thoracic spine. Paraspinal and other soft tissues: Atherosclerotic calcification  aorta and iliac arteries. No aneurysm. Mild paraspinous edema around the T12 fracture. No retroperitoneal adenopathy. Disc levels: L1-2: Moderate spinal stenosis with disc and facet degeneration L2-3: Mild to moderate spinal stenosis due to disc bulging and facet hypertrophy L3-4: Mild spinal stenosis due to disc and facet degeneration. L4-5: Mild spinal stenosis due to disc and facet degeneration L5-S1: Bilateral facet hypertrophy and disc bulging. Mild subarticular stenosis bilaterally IMPRESSION: CT THORACIC SPINE IMPRESSION 1. Widespread lytic lesions throughout the thoracic spine compatible with multiple myeloma. Fractures of T11 and T12. Bony retropulsion of the T12 fracture into the canal causing moderate spinal stenosis. 2. Fracture of the left second rib anteriorly may be acute. Multiple rib lesions compatible with myeloma. CT LUMBAR SPINE IMPRESSION 1. Widespread lytic lesions throughout the lumbar spine compatible with myeloma. No fracture. 2. Multilevel lumbar degenerative change. Moderate spinal stenosis L1-2. Electronically Signed   By: Franchot Gallo M.D.   On: 09/26/2021 18:04   CT L-SPINE NO CHARGE  Result Date: 09/26/2021 CLINICAL DATA:  Recent diagnosis cervical vertebrae fracture and cancer unknown primary. Unable to walk well, change in mental status EXAM: CT THORACIC AND LUMBAR SPINE WITHOUT CONTRAST TECHNIQUE: Multidetector CT imaging of the thoracic and lumbar spine was performed without contrast. Multiplanar CT image reconstructions were also generated. COMPARISON:  None. FINDINGS: CT THORACIC SPINE FINDINGS Alignment: Normal Vertebrae: Extensive and widespread lytic lesions throughout the entire thoracic spine. Similar lesions also present in multiple ribs. Probable myeloma Compression fractures of T11 and T12. Bony retropulsion of the T12 fracture into the canal causing moderate spinal stenosis. This appears to be a pathologic fracture. Fracture of the left second rib anteriorly may  be acute. Paraspinal and other soft tissues: Mild atherosclerotic calcification aortic arch. Pulmonary artery enlargement. Mild pleural thickening bilaterally inferiorly. Visualized lungs are clear without infiltrate or mass. Disc levels: Disc degeneration throughout the thoracic spine. No significant spinal stenosis. CT LUMBAR SPINE FINDINGS Segmentation: 5 lumbar segments. Alignment: Normal Vertebrae: Negative for lumbar fracture. Widespread lytic lesions throughout the lumbar vertebra similar to that seen in the thoracic spine. Paraspinal and other soft tissues: Atherosclerotic calcification aorta and iliac arteries. No aneurysm. Mild paraspinous edema around the T12 fracture. No retroperitoneal adenopathy. Disc levels: L1-2: Moderate spinal stenosis with disc and facet degeneration L2-3: Mild to moderate spinal stenosis due to disc bulging and facet hypertrophy L3-4: Mild spinal stenosis due to disc and  facet degeneration. L4-5: Mild spinal stenosis due to disc and facet degeneration L5-S1: Bilateral facet hypertrophy and disc bulging. Mild subarticular stenosis bilaterally IMPRESSION: CT THORACIC SPINE IMPRESSION 1. Widespread lytic lesions throughout the thoracic spine compatible with multiple myeloma. Fractures of T11 and T12. Bony retropulsion of the T12 fracture into the canal causing moderate spinal stenosis. 2. Fracture of the left second rib anteriorly may be acute. Multiple rib lesions compatible with myeloma. CT LUMBAR SPINE IMPRESSION 1. Widespread lytic lesions throughout the lumbar spine compatible with myeloma. No fracture. 2. Multilevel lumbar degenerative change. Moderate spinal stenosis L1-2. Electronically Signed   By: Franchot Gallo M.D.   On: 09/26/2021 18:04    Medications: I have reviewed the patient's current medications.   Assessment/Plan: This is a very pleasant 87 years old white female with newly diagnosed bone lesion as well as suspicious right breast mass and right axillary  lymphadenopathy concerning for metastatic breast cancer with bone metastasis. The patient also had lytic bone lesion and there is some concern about possibility of underlying myeloma which is unlikely. She had a myeloma panel performed but the results are still not available. If the myeloma panel is negative, she may benefit from biopsy of the right axillary lymph node or the breast mass for confirmation of her tissue diagnosis followed by treatment for the metastatic breast cancer hopefully with hormonal therapy if she has ER/PR positive malignancy. The patient is interested in this option. We will continue to monitor her closely and update her about the myeloma panel and the next step in her management.  She will follow-up with Dr. Quintella Reichert after discharge for more detailed discussion of her treatment options. Thank you so much for taking good care of Ms. Delvin, we will continue to follow-up the patient with you and assist in her management on as-needed basis.  LOS: 2 days    Eilleen Kempf 09/28/2021

## 2021-09-29 DIAGNOSIS — R531 Weakness: Secondary | ICD-10-CM | POA: Diagnosis not present

## 2021-09-29 DIAGNOSIS — Z7189 Other specified counseling: Secondary | ICD-10-CM

## 2021-09-29 DIAGNOSIS — Z515 Encounter for palliative care: Secondary | ICD-10-CM

## 2021-09-29 DIAGNOSIS — Z66 Do not resuscitate: Secondary | ICD-10-CM

## 2021-09-29 LAB — CBC
HCT: 28 % — ABNORMAL LOW (ref 36.0–46.0)
Hemoglobin: 8.6 g/dL — ABNORMAL LOW (ref 12.0–15.0)
MCH: 27 pg (ref 26.0–34.0)
MCHC: 30.7 g/dL (ref 30.0–36.0)
MCV: 87.8 fL (ref 80.0–100.0)
Platelets: 254 10*3/uL (ref 150–400)
RBC: 3.19 MIL/uL — ABNORMAL LOW (ref 3.87–5.11)
RDW: 16.7 % — ABNORMAL HIGH (ref 11.5–15.5)
WBC: 9.3 10*3/uL (ref 4.0–10.5)
nRBC: 0 % (ref 0.0–0.2)

## 2021-09-29 LAB — BASIC METABOLIC PANEL
Anion gap: 6 (ref 5–15)
BUN: 17 mg/dL (ref 8–23)
CO2: 26 mmol/L (ref 22–32)
Calcium: 10.1 mg/dL (ref 8.9–10.3)
Chloride: 108 mmol/L (ref 98–111)
Creatinine, Ser: 1.03 mg/dL — ABNORMAL HIGH (ref 0.44–1.00)
GFR, Estimated: 53 mL/min — ABNORMAL LOW (ref >60)
Glucose, Bld: 102 mg/dL — ABNORMAL HIGH (ref 70–99)
Potassium: 3.9 mmol/L (ref 3.5–5.1)
Sodium: 140 mmol/L (ref 135–145)

## 2021-09-29 LAB — PARATHYROID HORMONE, INTACT (NO CA): PTH: 13 pg/mL — ABNORMAL LOW (ref 15–65)

## 2021-09-29 MED ORDER — LORAZEPAM 2 MG/ML IJ SOLN
INTRAMUSCULAR | Status: AC
  Filled 2021-09-29: qty 1

## 2021-09-29 NOTE — TOC Initial Note (Addendum)
Transition of Care [facility]) - Initial/Assessment Note    Patient Details  Name: Barbara Kelley MRN: [id number] Date of Birth: 1934/02/05  [facility]) CM/SW Contact:    Leeroy Cha, RN Phone Number: 09/29/2021, 10:24 AM  Clinical Narrative:                  Transition of Care [facility]) Screening Note   Patient Details  Name: Barbara Kelley Date of Birth: 31-Jan-1934   Transition of Care [facility]) CM/SW Contact:    Leeroy Cha, RN Phone Number: 09/29/2021, 10:25 AM   PT has recommended snf placement will start with passar and fl2./FL2 FAXED OUT  Expected Discharge Plan: Skilled Nursing Facility Barriers to Discharge: Continued Medical Work up   Patient Goals and CMS Choice Patient states their goals for this hospitalization and ongoing recovery are:: to eventually go home CMS Medicare.gov Compare Post Acute Care list provided to:: Patient    Expected Discharge Plan and Services Expected Discharge Plan: [place]   Discharge Planning Services: CM Consult   Living arrangements for the past 2 months: Apartment                                      Prior Living Arrangements/Services Living arrangements for the past 2 months: Apartment Lives with:: Self Patient language and need for interpreter reviewed:: Yes Do you feel safe going back to the place where you live?: Yes            Criminal Activity/Legal Involvement Pertinent to Current Situation/Hospitalization: No - Comment as needed  Activities of Daily Living Home Assistive Devices/Equipment: Eyeglasses, Gilford Rile (specify type) ADL Screening (condition at time of admission) Patient's cognitive ability adequate to safely complete daily activities?: No Is the patient deaf or have difficulty hearing?: No Does the patient have difficulty seeing, even when wearing glasses/contacts?: No Does the patient have difficulty concentrating, remembering, or making decisions?:  Yes Patient able to express need for assistance with ADLs?: Yes Does the patient have difficulty dressing or bathing?: Yes Independently performs ADLs?: No Communication: Independent Dressing (OT): Needs assistance Is this a change from baseline?: Change from baseline, expected to last >3 days Grooming: Independent Feeding: Independent Bathing: Needs assistance Is this a change from baseline?: Change from baseline, expected to last >3 days Toileting: Needs assistance Is this a change from baseline?: Change from baseline, expected to last >3days In/Out Bed: Needs assistance Is this a change from baseline?: Change from baseline, expected to last >3 days Walks in Home: Dependent Is this a change from baseline?: Change from baseline, expected to last >3 days Does the patient have difficulty walking or climbing stairs?: Yes Weakness of Legs: Both Weakness of Arms/Hands: Both  Permission Sought/Granted                  Emotional Assessment Appearance:: Appears stated age Attitude/Demeanor/Rapport: Engaged Affect (typically observed): Calm Orientation: : Oriented to Self, Oriented to Place, Oriented to  Time, Oriented to Situation Alcohol / Substance Use: Not Applicable Psych Involvement: No (comment)  Admission diagnosis:  Generalized weakness [R53.1] Fall, initial encounter [W19.XXXA] Patient Active Problem List   Diagnosis Date Noted   Generalized weakness 09/26/2021   AKI (acute kidney injury) ([place]) 09/26/2021   Hypercalcemia 09/26/2021   Lytic bone lesions on xray 09/26/2021   S/P thoracentesis    Shortness of breath    Acute on  chronic heart failure ([place]) 10/09/2019   Mitral regurgitation 08/11/2018   Mild CAD 08/11/2018   Closed fracture of right hip ([place])    Closed subcapital fracture of neck of femur, right, initial encounter ([place]) 10/18/2017   Hypertensive heart disease without CHF 10/18/2017   NICM (nonischemic cardiomyopathy) ([place]) 10/18/2017   Chronic  systolic heart failure ([place]) 10/18/2017   HLD (hyperlipidemia) 10/18/2017   Near syncope 10/18/2017   S/P left TKA 05/04/2017   S/P total knee replacement 05/04/2017   History of CVA (cerebrovascular accident) 10/05/2006   PCP:  [name], MD (Inactive) Pharmacy:   [id number] - [place], [place] - [address] [place] [address] Phone: [phone] Fax: [phone]     Social Determinants of Health (SDOH) Interventions    Readmission Risk Interventions No flowsheet data found.

## 2021-09-29 NOTE — Progress Notes (Signed)
PROGRESS NOTE    KELLENE MCCLEARY  IFO:[id number] DOB: 1933/12/16 DOA: 09/26/2021  PCP: [name], MD (Inactive)   Brief Narrative:  This 87 years old female with PMH significant for GERD, hypertension, CAD, HFpEF presented in the ED with c/o: generalized weakness, worsening chronic back pain and decreased p.o. intake.  Patient reports about a month ago she has developed back pain,  She has seen her PCP who has referred her to orthopedics after imaging it came out that she has compression fracture. She was considered to have kyphoplasty but MRI was obtained and found to have  lytic lesions.  She was referred to oncology and she was supposed to see next week however she has worsening back pain causing generalized weakness,  she was unable to get out of bed. She is found to have hypercalcemia, widespread bony lesions consistent with metastasis, oncology is consulted.  Myeloma labs were sent.  She also has a breast lesion.  If she has negative myeloma labs then she will probably need a biopsy of the breast mass and if it is hormonal sensitive then probably needs hormonal treatment.  Assessment & Plan:   Principal Problem:   Generalized weakness Active Problems:   History of CVA (cerebrovascular accident)   Chronic systolic heart failure (HCC)   AKI (acute kidney injury) (HCC)   Hypercalcemia   Lytic bone lesions on xray   Generalized weakness: Could be multifactorial, likely from Lytic vertebral bone lesions with resultant fractures and hypercalcemia. Continue IV hydration, fall precautions. PT and OT evaluation.  Metastatic bony lesions: Imaging showed widespread Lytic lesions throughout the spine concern for malignancy / myeloma. She was scheduled to see oncology next week.  Oncology is consulted. Follow-up SPEP, UPEP, free light chains. Continue adequate pain control with pain medications. Plan: If myeloma labs negative, She will need Breast biopsy or lymph node biopsy, if has  breast cancer hormone sensitive,  She will require hormonal treatment.  If myeloma labs positive she will probably need palliative care. She does not want chemotherapy if she has both positive  Hypercalcemia: Serum calcium on admission 11.6. Likely secondary to lytic bone lesions. She is given Zometa, follow-up serum calcium 10.1 Hold on bisphosphonates. Continue gentle IV hydration. Serum calcium improving. 11.6>10.9>10.5> 10.1  AKI: Suspect prerenal due to decreased p.o. intake and hypercalcemia Hold Entresto. Continue IV gentle hydration,  AKI improving. Continue to monitor serum creatinine.  Chronic HFrEF: Does not seem in acute exacerbation at this time.   Closely monitor because she is receiving IV hydration for hypercalcemia. We will hold on home Lasix for now.  Hold Entresto given AKI.  Essential hypertension: Continue beta-blocker.   DVT prophylaxis: Lovenox Code Status: Full code Family Communication: Daughter at bedside Disposition Plan:   Status is: Inpatient  Remains inpatient appropriate because:   Admitted for hypercalcemia requiring IV hydration, metastatic bone lesions requiring oncology evaluation.  Anticipated discharge to skilled nursing facility once calcium improves.    Consultants:   Oncology Procedures: None   Antimicrobials:  Anti-infectives (From admission, onward)    None       Subjective: Patient was seen and examined at bedside.  Overnight events noted.   She reports having pain in the left lower chest, appears deconditioned, not in any distress. States she is having severe pain in the back.  Objective: Vitals:   09/28/21 0528 09/28/21 1440 09/28/21 2015 09/29/21 0524  BP: (!) 151/89 140/81 138/73 (!) 160/92  Pulse: 100 (!) 108 100 91  Resp:  14 16 14 16   Temp: 98.9 F (37.2 C) 99.4 F (37.4 C) 99.8 F (37.7 C) 98.5 F (36.9 C)  TempSrc: Oral Oral Oral Oral  SpO2: 94% 94% 94% 94%    Intake/Output Summary (Last 24 hours)  at 09/29/2021 1335 Last data filed at 09/29/2021 0944 Gross per 24 hour  Intake 360 ml  Output 400 ml  Net -40 ml   There were no vitals filed for this visit.  Examination:  General exam: Appears comfortable, not in any acute distress, deconditioned. Respiratory system: Clear to auscultation bilaterally. Respiratory effort normal. RR 13 Cardiovascular system: S1 & S2 heard, regular rate and rhythm, no murmur. Chest: Left chest wall tenderness++. Gastrointestinal system: Abdomen is soft, nontender, nondistended, BS+ Central nervous system: Alert and oriented  X 3. No focal neurological deficits. Extremities: No edema, no cyanosis, no clubbing. Skin: No rashes, lesions or ulcers Psychiatry: Judgement and insight appear normal. Mood & affect appropriate.     Data Reviewed: I have personally reviewed following labs and imaging studies  CBC: Recent Labs  Lab 09/26/21 1553 09/27/21 0501 09/28/21 0531 09/29/21 0438  WBC 11.6* 11.5* 10.4 9.3  NEUTROABS 6.8  --   --   --   HGB 9.5* 8.5* 8.6* 8.6*  HCT 30.3* 27.1* 27.6* 28.0*  MCV 87.3 88.3 88.2 87.8  PLT 307 288 274 858   Basic Metabolic Panel: Recent Labs  Lab 09/26/21 1553 09/26/21 2143 09/27/21 0501 09/28/21 0531 09/29/21 0438  NA 137  --  140 139 140  K 3.7  --  3.5 3.7 3.9  CL 101  --  106 109 108  CO2 27  --  26 26 26   GLUCOSE 94  --  125* 101* 102*  BUN 29*  --  25* 20 17  CREATININE 1.42*  --  1.21* 1.03* 1.03*  CALCIUM 11.6*  --  10.9* 10.5* 10.1  MG  --  2.3 2.2 2.0  --   PHOS  --  3.4 3.7 2.7  --    GFR: CrCl cannot be calculated (Unknown ideal weight.). Liver Function Tests: Recent Labs  Lab 09/26/21 1553 09/27/21 0501  AST 35 30  ALT 12 10  ALKPHOS 137* 111  BILITOT 0.8 0.6  PROT 6.5 5.6*  ALBUMIN 3.4* 2.8*   No results for input(s): LIPASE, AMYLASE in the last 168 hours. No results for input(s): AMMONIA in the last 168 hours. Coagulation Profile: No results for input(s): INR, PROTIME in  the last 168 hours. Cardiac Enzymes: No results for input(s): CKTOTAL, CKMB, CKMBINDEX, TROPONINI in the last 168 hours. BNP (last 3 results) No results for input(s): PROBNP in the last 8760 hours. HbA1C: No results for input(s): HGBA1C in the last 72 hours. CBG: No results for input(s): GLUCAP in the last 168 hours. Lipid Profile: No results for input(s): CHOL, HDL, LDLCALC, TRIG, CHOLHDL, LDLDIRECT in the last 72 hours. Thyroid Function Tests: No results for input(s): TSH, T4TOTAL, FREET4, T3FREE, THYROIDAB in the last 72 hours. Anemia Panel: No results for input(s): VITAMINB12, FOLATE, FERRITIN, TIBC, IRON, RETICCTPCT in the last 72 hours. Sepsis Labs: No results for input(s): PROCALCITON, LATICACIDVEN in the last 168 hours.  Recent Results (from the past 240 hour(s))  Resp Panel by RT-PCR (Flu A&B, Covid) Nasopharyngeal Swab     Status: None   Collection Time: 09/26/21  3:52 PM   Specimen: Nasopharyngeal Swab; Nasopharyngeal(NP) swabs in vial transport medium  Result Value Ref Range Status   SARS Coronavirus 2 by RT  PCR NEGATIVE NEGATIVE Final    Comment: (NOTE) SARS-CoV-2 target nucleic acids are NOT DETECTED.  The SARS-CoV-2 RNA is generally detectable in upper respiratory specimens during the acute phase of infection. The lowest concentration of SARS-CoV-2 viral copies this assay can detect is 138 copies/mL. A negative result does not preclude SARS-Cov-2 infection and should not be used as the sole basis for treatment or other patient management decisions. A negative result may occur with  improper specimen collection/handling, submission of specimen other than nasopharyngeal swab, presence of viral mutation(s) within the areas targeted by this assay, and inadequate number of viral copies(<138 copies/mL). A negative result must be combined with clinical observations, patient history, and epidemiological information. The expected result is Negative.  Fact Sheet for  Patients:  EntrepreneurPulse.com.au  Fact Sheet for Healthcare Providers:  IncredibleEmployment.be  This test is no t yet approved or cleared by the [place] FDA and  has been authorized for detection and/or diagnosis of SARS-CoV-2 by FDA under an Emergency Use Authorization (EUA). This EUA will remain  in effect (meaning this test can be used) for the duration of the COVID-19 declaration under Section 564(b)(1) of the Act, 21 U.S.C.section 360bbb-3(b)(1), unless the authorization is terminated  or revoked sooner.       Influenza A by PCR NEGATIVE NEGATIVE Final   Influenza B by PCR NEGATIVE NEGATIVE Final    Comment: (NOTE) The Xpert Xpress SARS-CoV-2/FLU/RSV plus assay is intended as an aid in the diagnosis of influenza from Nasopharyngeal swab specimens and should not be used as a sole basis for treatment. Nasal washings and aspirates are unacceptable for Xpert Xpress SARS-CoV-2/FLU/RSV testing.  Fact Sheet for Patients: EntrepreneurPulse.com.au  Fact Sheet for Healthcare Providers: IncredibleEmployment.be  This test is not yet approved or cleared by the [place] FDA and has been authorized for detection and/or diagnosis of SARS-CoV-2 by FDA under an Emergency Use Authorization (EUA). This EUA will remain in effect (meaning this test can be used) for the duration of the COVID-19 declaration under Section 564(b)(1) of the Act, 21 U.S.C. section 360bbb-3(b)(1), unless the authorization is terminated or revoked.  Performed at [facility], [place] [address] [place], [place] [address]      Radiology Studies: No results found.  Scheduled Meds:  carvedilol  6.25 mg Oral BID WC   enoxaparin (LOVENOX) injection  40 mg Subcutaneous Q24H   feeding supplement  237 mL Oral BID BM   multivitamin with minerals  1 tablet Oral Daily   Continuous Infusions:   LOS: 3 days     Time spent:  25 mins    Shawna Clamp, MD [facility]   If 7PM-7AM, please contact night-coverage

## 2021-09-29 NOTE — NC FL2 (Signed)
[place] LEVEL OF CARE SCREENING TOOL     IDENTIFICATION  Patient Name: Barbara Kelley Birthdate: 12/15/1933 Sex: female Admission Date (Current Location): 09/26/2021  [facility] and [place] Number:  Herbalist and Address:  [facility],  [place], [place]      Provider Number: [id number]  Attending Physician Name and Address:  Shawna Clamp, MD  Relative Name and Phone Number:       Current Level of Care: Hospital Recommended Level of Care: [place] Prior Approval Number:    Date Approved/Denied:   PASRR Number: [id number]  Discharge Plan: SNF    Current Diagnoses: Patient Active Problem List   Diagnosis Date Noted   Generalized weakness 09/26/2021   AKI (acute kidney injury) ([place]) 09/26/2021   Hypercalcemia 09/26/2021   Lytic bone lesions on xray 09/26/2021   S/P thoracentesis    Shortness of breath    Acute on chronic heart failure ([place]) 10/09/2019   Mitral regurgitation 08/11/2018   Mild CAD 08/11/2018   Closed fracture of right hip ([place])    Closed subcapital fracture of neck of femur, right, initial encounter ([place]) 10/18/2017   Hypertensive heart disease without CHF 10/18/2017   NICM (nonischemic cardiomyopathy) ([place]) 26/71/2458   Chronic systolic heart failure ([place]) 10/18/2017   HLD (hyperlipidemia) 10/18/2017   Near syncope 10/18/2017   S/P left TKA 05/04/2017   S/P total knee replacement 05/04/2017   History of CVA (cerebrovascular accident) 10/05/2006    Orientation RESPIRATION BLADDER Height & Weight     Self, Time, Situation, Place  Normal Continent Weight:   Height:     BEHAVIORAL SYMPTOMS/MOOD NEUROLOGICAL BOWEL NUTRITION STATUS      Continent Diet (regular)  AMBULATORY STATUS COMMUNICATION OF NEEDS Skin   Extensive Assist Verbally Normal                       Personal Care Assistance Level of Assistance  Bathing, Feeding, Dressing Bathing Assistance: Limited  assistance Feeding assistance: Limited assistance Dressing Assistance: Limited assistance     Functional Limitations Info  Sight, Hearing, Speech Sight Info: Adequate Hearing Info: Adequate Speech Info: Adequate    SPECIAL CARE FACTORS FREQUENCY  PT (By licensed PT), OT (By licensed OT)     PT Frequency: 5x weekly OT Frequency: 5 x weekly            Contractures Contractures Info: Not present    Additional Factors Info  Code Status Code Status Info: FULL             Current Medications (09/29/2021):  This is the current hospital active medication list Current Facility-Administered Medications  Medication Dose Route Frequency Provider Last Rate Last Admin   acetaminophen (TYLENOL) tablet 650 mg  650 mg Oral Q6H PRN Doran Heater, DO   650 mg at 09/27/21 1213   Or   acetaminophen (TYLENOL) suppository 650 mg  650 mg Rectal Q6H PRN MacNeil, Richard G, DO       albuterol (PROVENTIL) (2.5 MG/3ML) 0.083% nebulizer solution 2.5 mg  2.5 mg Nebulization Q6H PRN MacNeil, Richard G, DO       carvedilol (COREG) tablet 6.25 mg  6.25 mg Oral BID WC MacNeil, Richard G, DO   6.25 mg at 09/29/21 0904   enoxaparin (LOVENOX) injection 40 mg  40 mg Subcutaneous Q24H Doran Heater, DO   40 mg at 09/29/21 0905   feeding supplement (ENSURE ENLIVE / ENSURE  PLUS) liquid 237 mL  237 mL Oral BID BM Shawna Clamp, MD   237 mL at 09/29/21 0910   LORazepam (ATIVAN) 2 MG/ML injection            multivitamin with minerals tablet 1 tablet  1 tablet Oral Daily Shawna Clamp, MD   1 tablet at 09/29/21 0904   ondansetron (ZOFRAN) tablet 4 mg  4 mg Oral Q6H PRN Doran Heater, DO       Or   ondansetron [facility]) injection 4 mg  4 mg Intravenous Q6H PRN Doran Heater, DO       oxyCODONE (Oxy IR/ROXICODONE) immediate release tablet 5 mg  5 mg Oral Q4H PRN Doran Heater, DO   5 mg at 09/29/21 4734     Discharge Medications: Please see discharge summary for a list of discharge  medications.  Relevant Imaging Results:  Relevant Lab Results:   Additional Information SSN: [id number]  Leeroy Cha, RN

## 2021-09-29 NOTE — Consult Note (Signed)
Palliative Care Consult Note                                  Date: 09/29/2021   Patient Name: Barbara Kelley  DOB: 09/28/1934  MRN: [id number]  Age / Sex: 87 y.o., female  PCP: [name], MD (Inactive) Referring Physician: Shawna Clamp, MD  Reason for Consultation: Establishing goals of care  HPI/Patient Profile: Palliative Care consult requested for goals of care discussion in this 87 y.o. female  with past medical history of hypertension, CAD, CHF, GERD, and CAD.  She was admitted on 09/26/2021 from home with generalized weakness, poor p.o. intake, and persistent back pain.  Recent spinal x-ray showing compression fracture with plans for possible kyphoplasty.  MRI showed questionable metastatic lytic lesions.  Oncology following with pending myeloma work-up and possible breast biopsy.  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  Barbara Kelley is awake, alert and oriented x3.  She is sitting upright in bed.  Endorses weakness and occasional back pain.  Denies nausea or vomiting.   Concept of Palliative Care was introduced as specialized medical care for people and their families living with serious illness.  It focuses  on providing relief from the symptoms and stress of a serious illness.  The goal is to improve quality of life for both the patient and the family. Values and goals of care important to patient and family were attempted to be elicited.  I created space and opportunity for patient and family to explore state of health prior to admission, thoughts, and feelings.  Patient reports living alone at [facility].  Prior to admission she was ambulatory.  Able to perform most ADLs independently.  Complains of decreased appetite over the past 2 months.  States she has lost approximately 15-20 pounds over the past 4 months.  Endorses ongoing weakness and fatigue requiring frequent rest breaks.  States at times her legs become weak and she fears that she will fall.  The week prior to admission daughter shares she barely got out of bed and due to increased weakness it was a challenge to get patient up and into a car for a medical appointment.  We discussed Her current illness and what it means in the larger context of Her on-going co-morbidities. Natural disease trajectory and expectations were discussed.  Barbara Kelley and her daughter verbalized understanding of current illness and co-morbidities.  Her daughter is emotional expressing awareness of patient's current condition with pending results to gain a definitive diagnosis and plan.  She shares both  she and her sister are in full support of her mother and knows that her quality of life has significantly decreased over the past 6 months.  Patient states she is a [place] and is ready to go to heaven when her time comes.  She would like to continue to take things day by day and not make any final decisions until she knows what she is faced with from oncology perspective.  We discussed this is very much appropriate in order to not make any hasty decisions.  She speaks to her quality of life which is most important to her versus quantity.  Barbara Kelley expresses if  her pending results identify that she has myeloma or her cancer is extensive she is unsure that she would want to undergo any aggressive treatments such as chemotherapy as she does not feel like she would be able to tolerate this.  I advised to continue ongoing discussions with oncology to gain a better understanding of what treatment options are while also keeping into account her wishes for a good quality of life.  Her daughter verbalizes understanding and appreciation expressing both she and her sister are in agreement with her mother's decisions and also would not want her to undergo treatments that we will further decrease her current quality of life.  They do not wish for patient to suffer and if focusing on her comfort provides her with the best support they are in for support.  I discussed the importance of continued conversation with family and their medical providers regarding overall plan of care and treatment options, ensuring decisions are within the context of the patients values and GOCs.  Questions and concerns were addressed.  Patient and family was encouraged to call with questions or concerns.  PMT will continue to support holistically as needed.  Life Review: Patient is a resident at [facility] for over 5 years.  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  Widowed with 2 daughters and 4 grandchildren.  She enjoys reading and playing the piano which she has done for many years at her local church.  Strong Christian faith.   Objective:   Primary Diagnoses: Present on Admission:  Chronic systolic heart failure (HCC)   Scheduled Meds:  carvedilol  6.25 mg Oral BID WC   enoxaparin (LOVENOX) injection  40 mg Subcutaneous Q24H   feeding supplement  237 mL Oral BID BM   multivitamin with minerals  1 tablet Oral Daily    Continuous Infusions:   PRN Meds: acetaminophen **OR** acetaminophen, albuterol, ondansetron **OR** ondansetron (ZOFRAN) IV,  oxyCODONE  Allergies  Allergen Reactions   Doxycycline Nausea Only    Vertigo   Macrobid [Nitrofurantoin] Other (See Comments)    Nerve pain   Valsartan Other (See Comments) and Hypertension    Blood pressure rise   Lisinopril Cough   Calcium Channel Blockers Palpitations    Blood pressure rise / tachycardia    Penicillins Rash    Has patient had a PCN reaction causing immediate rash, facial/tongue/throat swelling, SOB or lightheadedness with hypotension: Yes Has patient had a PCN reaction causing severe rash involving mucus membranes or skin necrosis: Yes Has patient had a PCN reaction that required hospitalization: Unk Has patient had a PCN reaction occurring within the last 10 years: No If all of the above answers are "NO", then may proceed with Cephalosporin use.     Review of Systems  Constitutional:  Positive for appetite change, fatigue and unexpected weight change.  Musculoskeletal:  Positive for arthralgias.  Neurological:  Positive for weakness.  Unless otherwise noted, a complete review of systems is negative.  Physical Exam General: NAD, weak appearing Cardiovascular: regular rate and rhythm Pulmonary: clear bilaterally Abdomen: soft, nontender, + bowel sounds Extremities: no edema, no joint deformities Skin: no rashes, warm and dry Neurological: AAOx3, mood appropriate  Vital Signs:  BP (!) 160/92 (BP Location: Right Arm)    Pulse 91    Temp 98.5 F (36.9 C) (Oral)    Resp 16    SpO2 94%  Pain Scale: 0-10   Pain Score: 6   SpO2: SpO2: 94 % O2 Device:SpO2: 94 % O2 Flow Rate: .   IO: Intake/output summary:  Intake/Output Summary (Last 24 hours) at 09/29/2021 1351 Last data filed at 09/29/2021 0944 Gross per 24 hour  Intake 360 ml  Output 400 ml  Net -40 ml    LBM: Last BM Date: 09/27/21 Baseline Weight:   Most recent weight:        Palliative Assessment/Data: PPS 30-40%   Advanced Care Planning:   Primary Decision Maker: PATIENT-with  support of her 2 daughters  Code Status/Advance Care Planning: DNR  A discussion was had today regarding advanced directives. Concepts specific to code status, artifical feeding and hydration, continued IV antibiotics and rehospitalization was had.  Patient states she does not have an advanced directive.  Education provided on completion while hospitalized if she is interested.  Both patient and daughter verbalized understanding requesting spiritual care referral to assist in finalizing document.  Patient was provided with advanced directive documents to further review with her daughters and understands she may complete without signing in specified areas.  I discussed at length patient's current full CODE STATUS with consideration of her current illness, comorbidities, and expressed wishes.  Ms. Rudnicki and her daughter both verbalized request for DNR/DNI.  She states she does not wish to have any heroic or life-sustaining measures and to be allowed a natural dying process when that time comes.  Education provided on DNR/DNI and what this would look like for patient.  She verbalized understanding again confirming wishes for DNR.  The MOST form was introduced and discussed.  Patient provided with MOST form and plans to further discuss with her daughter.  They would like to have follow-up tomorrow with plans on completing.  The difference between a aggressive medical intervention path and a palliative comfort care path was discussed.  Patient and her daughter are inquiring about palliative versus hospice support.  Hospice and Palliative Care services outpatient were explained and offered. Patient and family verbalized their understanding and awareness of both palliative and hospice's goals and philosophy of care.  Patient and family are aware if she chooses to continue with ongoing work-up and support I will be happy to follow her at the [facility] for palliative needs and ongoing goals of  care discussions.  They verbalized understanding and appreciation.  Decisions/Changes to ACP: DNR/DNI MOST form provided with education and plans to complete on tomorrow Patient requesting assistance with completion of advance directive  Assessment & Plan:   SUMMARY OF RECOMMENDATIONS   DNR/DNI-as requested and confirmed by patient/daughter Continue with current plan of care.  Ms. Duran and her daughter [name] would like to continue to treat the treatable while hospitalized.  Pending oncology results and further work-up.  Patient is having ongoing family discussions in regards to how aggressive she would wish to proceed with oncology related care.  States she is not sure  she would want to undergo aggressive chemotherapy as her quality of life has greatly diminished over the past several months and she does not wish to risk any further decline.  Would rather spend what time she has left comfortable and at peace if this is going to be more appropriate.  She and family plans to appropriately continue ongoing support and follow-up with oncology for a more definitive diagnosis and understanding prior to making any final decisions. TOC referral for outpatient palliative support.  I will also plan to continue following patient outpatient at the [facility] for palliative needs. Spiritual care referral for advanced directive completion.  Patient has documents at the bedside. MOST education provided.  Patient and daughter plans to review with requests for follow-up tomorrow or to complete. PMT will continue to support and follow as needed. Please call team line with urgent needs.  Symptom Management:  Per Attending  Palliative Prophylaxis:  Frequent Pain Assessment  Additional Recommendations (Limitations, Scope, Preferences): No Artificial Feeding, No Tracheostomy, and DNR/DNI, continue to treat the treatable  Psycho-social/Spiritual:  Desire for further Chaplaincy support:  yes Additional Recommendations: Education on Hospice  Prognosis:  Unable to determine (guarded)  Discharge Planning:  To Be Determined with outpatient palliative support    Patient and her daughter [name] expressed understanding and was in agreement with this plan.   Time Total: 75 min  Visit consisted of counseling and education dealing with the complex and emotionally intense issues of symptom management and palliative care in the setting of serious and potentially life-threatening illness.Greater than 50%  of this time was spent counseling and coordinating care related to the above assessment and plan.  Signed by:  Alda Lea, AGPCNP-BC Palliative Medicine Team  Phone: [phone] Pager: [phone] Amion: Bjorn Pippin   Thank you for allowing the Palliative Medicine Team to assist in the care of this patient. Please utilize secure chat with additional questions, if there is no response within 30 minutes please call the above phone number. Palliative Medicine Team providers are available by phone from 7am to 5pm daily and can be reached through the team cell phone.  Should this patient require assistance outside of these hours, please call the patient's attending physician.

## 2021-09-29 NOTE — Plan of Care (Signed)
°  Problem: Health Behavior/Discharge Planning: Goal: Ability to manage health-related needs will improve Outcome: Progressing   Problem: Activity: Goal: Risk for activity intolerance will decrease Outcome: Progressing   Problem: Nutrition: Goal: Adequate nutrition will be maintained Outcome: Progressing   Problem: Pain Managment: Goal: General experience of comfort will improve Outcome: Progressing   Problem: Safety: Goal: Ability to remain free from injury will improve Outcome: Progressing   

## 2021-09-30 ENCOUNTER — Inpatient Hospital Stay: Payer: Medicare PPO | Admitting: Oncology

## 2021-09-30 DIAGNOSIS — I5022 Chronic systolic (congestive) heart failure: Secondary | ICD-10-CM | POA: Diagnosis not present

## 2021-09-30 DIAGNOSIS — W19XXXA Unspecified fall, initial encounter: Secondary | ICD-10-CM

## 2021-09-30 DIAGNOSIS — Z8673 Personal history of transient ischemic attack (TIA), and cerebral infarction without residual deficits: Secondary | ICD-10-CM | POA: Diagnosis not present

## 2021-09-30 DIAGNOSIS — N179 Acute kidney failure, unspecified: Secondary | ICD-10-CM | POA: Diagnosis not present

## 2021-09-30 DIAGNOSIS — R531 Weakness: Secondary | ICD-10-CM | POA: Diagnosis not present

## 2021-09-30 LAB — BASIC METABOLIC PANEL
Anion gap: 6 (ref 5–15)
BUN: 18 mg/dL (ref 8–23)
CO2: 26 mmol/L (ref 22–32)
Calcium: 9.6 mg/dL (ref 8.9–10.3)
Chloride: 107 mmol/L (ref 98–111)
Creatinine, Ser: 1.02 mg/dL — ABNORMAL HIGH (ref 0.44–1.00)
GFR, Estimated: 54 mL/min — ABNORMAL LOW (ref >60)
Glucose, Bld: 97 mg/dL (ref 70–99)
Potassium: 4 mmol/L (ref 3.5–5.1)
Sodium: 139 mmol/L (ref 135–145)

## 2021-09-30 LAB — KAPPA/LAMBDA LIGHT CHAINS
Kappa free light chain: 50 mg/L — ABNORMAL HIGH (ref 3.3–19.4)
Kappa, lambda light chain ratio: 1.92 — ABNORMAL HIGH (ref 0.26–1.65)
Lambda free light chains: 26 mg/L (ref 5.7–26.3)

## 2021-09-30 LAB — CBC
HCT: 27.6 % — ABNORMAL LOW (ref 36.0–46.0)
Hemoglobin: 8.6 g/dL — ABNORMAL LOW (ref 12.0–15.0)
MCH: 27.2 pg (ref 26.0–34.0)
MCHC: 31.2 g/dL (ref 30.0–36.0)
MCV: 87.3 fL (ref 80.0–100.0)
Platelets: 242 10*3/uL (ref 150–400)
RBC: 3.16 MIL/uL — ABNORMAL LOW (ref 3.87–5.11)
RDW: 16.7 % — ABNORMAL HIGH (ref 11.5–15.5)
WBC: 10.4 10*3/uL (ref 4.0–10.5)
nRBC: 0 % (ref 0.0–0.2)

## 2021-09-30 LAB — PROTEIN ELECTRO, RANDOM URINE
Albumin ELP, Urine: 35.9 %
Alpha-1-Globulin, U: 3.5 %
Alpha-2-Globulin, U: 17.9 %
Beta Globulin, U: 24.2 %
Gamma Globulin, U: 18.5 %
Total Protein, Urine: 44.2 mg/dL

## 2021-09-30 LAB — CALCIUM, IONIZED: Calcium, Ionized, Serum: 6.4 mg/dL — ABNORMAL HIGH (ref 4.5–5.6)

## 2021-09-30 NOTE — Progress Notes (Signed)
PROGRESS NOTE    Barbara Kelley  JJH:[id number] DOB: 04-21-1934 DOA: 09/26/2021  PCP: [name], MD (Inactive)   Brief Narrative:   Patient is 87 years old female with past medical history of GERD, hypertension, coronary artery disease, heart failure with preserved ejection fraction presented to hospital with weakness, worsening chronic back pain with decreased oral intake.  Patient has had back pain for a month or so and was referred to orthopedics by her PCP as outpatient and was noted to have compression fracture.  She was considered to have kyphoplasty but when MRI was done it showed lytic lesions.  Patient was then referred to oncology but persisted to have worsening back pain and generalized weakness with hypercalcemia so he was brought into the hospital.  Patient was noted to have widespread bony lesions suggestive of metastasis.  Patient also had a breast lesion.  Myeloma labs have been sent if she has negative myeloma labs then she will probably need a biopsy of the breast mass and if it is hormonal sensitive then probably needs hormonal treatment.  Assessment & Plan:   Principal Problem:   Generalized weakness Active Problems:   History of CVA (cerebrovascular accident)   Chronic systolic heart failure (HCC)   AKI (acute kidney injury) (HCC)   Hypercalcemia   Lytic bone lesions on xray  Generalized weakness: Could be secondary to metastatic disease, hypercalcemia and volume depletion.  Received IV hydration.  Calcium improved.   Metastatic bony lesions: Imaging showed widespread Lytic lesions throughout the spine concern for malignancy / myeloma.  Patient was supposed to see oncology as outpatient but has been consulted.  During this admission.  Follow myeloma labs.  If negative, might need lymph node biopsy breast biopsy.  Patient expressing her wishes against chemotherapy with  Hypercalcemia: Serum calcium on admission 11.6.  Likely secondary to lytic bony lesions.   Received Zometa.  Received IV hydration.  Calcium level improved at 9.6 at this time.  Intact PTH low at 13.  Acute kidney injury Likely secondary to decreased oral intake and hypercalcemia.  Entresto on hold.  Continued IV hydration.  Latest creatinine at 1.0.  Chronic HFrEF: Patient received IV fluids.  Entresto and Lasix on hold at this time.  Not in acute exacerbation.    Essential hypertension: Continue Coreg.  Debility, deconditioning.  Patient has been seen by physical therapy who recommended skilled nursing facility placement.  Ethics/goals of care.  Patient has been seen by palliative care.  Currently DNR/DNI.   DVT prophylaxis: Lovenox subcu  Code Status: DNR  Family Communication:  Spoke with the patient's daughter at bedside  Disposition Plan:   Status is: Inpatient  Remains inpatient appropriate because:   Admitted for hypercalcemia requiring IV hydration, metastatic bone lesions   Consultants:   Oncology Palliative care  Procedures:  None   Antimicrobials:  Anti-infectives (From admission, onward)    None       Subjective: Today, patient was seen and examined at bedside.  Patient complains of back pain and difficulty ambulating.  Complains of pain at the rib area. Daughter at bedside.  Objective: Vitals:   09/29/21 0524 09/29/21 1704 09/29/21 2153 09/30/21 0539  BP: (!) 160/92 138/83 (!) 141/88 (!) 145/92  Pulse: 91 (!) 103 76 79  Resp: 16 16 19  (!) 22  Temp: 98.5 F (36.9 C) 98.7 F (37.1 C) 98.3 F (36.8 C) 98.2 F (36.8 C)  TempSrc: Oral Oral Oral Oral  SpO2: 94% 96% 94% 94%  Intake/Output Summary (Last 24 hours) at 09/30/2021 0752 Last data filed at 09/29/2021 1400 Gross per 24 hour  Intake 360 ml  Output --  Net 360 ml    There were no vitals filed for this visit.  Physical examination:  General:  Average built, not in obvious distress, deconditioned HENT:   No scleral pallor or icterus noted. Oral mucosa is moist.   Chest:  Clear breath sounds.  Diminished breath sounds bilaterally. No crackles or wheezes.  Left chest wall tenderness on palpation. CVS: S1 &S2 heard. No murmur.  Regular rate and rhythm. Abdomen: Soft, nontender, nondistended.  Bowel sounds are heard.   Extremities: No cyanosis, clubbing or edema.  Peripheral pulses are palpable. Psych: Alert, awake and oriented, normal mood CNS:  No cranial nerve deficits.  Power equal in all extremities.   Skin: Warm and dry.  No rashes noted.  Data Reviewed: I have personally reviewed the following labs and imaging studies   CBC: Recent Labs  Lab 09/26/21 1553 09/27/21 0501 09/28/21 0531 09/29/21 0438 09/30/21 0438  WBC 11.6* 11.5* 10.4 9.3 10.4  NEUTROABS 6.8  --   --   --   --   HGB 9.5* 8.5* 8.6* 8.6* 8.6*  HCT 30.3* 27.1* 27.6* 28.0* 27.6*  MCV 87.3 88.3 88.2 87.8 87.3  PLT 307 288 274 254 975    Basic Metabolic Panel: Recent Labs  Lab 09/26/21 1553 09/26/21 2143 09/27/21 0501 09/28/21 0531 09/29/21 0438 09/30/21 0438  NA 137  --  140 139 140 139  K 3.7  --  3.5 3.7 3.9 4.0  CL 101  --  106 109 108 107  CO2 27  --  26 26 26 26   GLUCOSE 94  --  125* 101* 102* 97  BUN 29*  --  25* 20 17 18   CREATININE 1.42*  --  1.21* 1.03* 1.03* 1.02*  CALCIUM 11.6*  --  10.9* 10.5* 10.1 9.6  MG  --  2.3 2.2 2.0  --   --   PHOS  --  3.4 3.7 2.7  --   --     GFR: CrCl cannot be calculated (Unknown ideal weight.). Liver Function Tests: Recent Labs  Lab 09/26/21 1553 09/27/21 0501  AST 35 30  ALT 12 10  ALKPHOS 137* 111  BILITOT 0.8 0.6  PROT 6.5 5.6*  ALBUMIN 3.4* 2.8*    No results for input(s): LIPASE, AMYLASE in the last 168 hours. No results for input(s): AMMONIA in the last 168 hours. Coagulation Profile: No results for input(s): INR, PROTIME in the last 168 hours. Cardiac Enzymes: No results for input(s): CKTOTAL, CKMB, CKMBINDEX, TROPONINI in the last 168 hours. BNP (last 3 results) No results for input(s): PROBNP  in the last 8760 hours. HbA1C: No results for input(s): HGBA1C in the last 72 hours. CBG: No results for input(s): GLUCAP in the last 168 hours. Lipid Profile: No results for input(s): CHOL, HDL, LDLCALC, TRIG, CHOLHDL, LDLDIRECT in the last 72 hours. Thyroid Function Tests: No results for input(s): TSH, T4TOTAL, FREET4, T3FREE, THYROIDAB in the last 72 hours. Anemia Panel: No results for input(s): VITAMINB12, FOLATE, FERRITIN, TIBC, IRON, RETICCTPCT in the last 72 hours. Sepsis Labs: No results for input(s): PROCALCITON, LATICACIDVEN in the last 168 hours.  Recent Results (from the past 240 hour(s))  Resp Panel by RT-PCR (Flu A&B, Covid) Nasopharyngeal Swab     Status: None   Collection Time: 09/26/21  3:52 PM   Specimen: Nasopharyngeal Swab; Nasopharyngeal(NP) swabs in  vial transport medium  Result Value Ref Range Status   SARS Coronavirus 2 by RT PCR NEGATIVE NEGATIVE Final    Comment: (NOTE) SARS-CoV-2 target nucleic acids are NOT DETECTED.  The SARS-CoV-2 RNA is generally detectable in upper respiratory specimens during the acute phase of infection. The lowest concentration of SARS-CoV-2 viral copies this assay can detect is 138 copies/mL. A negative result does not preclude SARS-Cov-2 infection and should not be used as the sole basis for treatment or other patient management decisions. A negative result may occur with  improper specimen collection/handling, submission of specimen other than nasopharyngeal swab, presence of viral mutation(s) within the areas targeted by this assay, and inadequate number of viral copies(<138 copies/mL). A negative result must be combined with clinical observations, patient history, and epidemiological information. The expected result is Negative.  Fact Sheet for Patients:  EntrepreneurPulse.com.au  Fact Sheet for Healthcare Providers:  IncredibleEmployment.be  This test is no t yet approved or cleared  by the [place] FDA and  has been authorized for detection and/or diagnosis of SARS-CoV-2 by FDA under an Emergency Use Authorization (EUA). This EUA will remain  in effect (meaning this test can be used) for the duration of the COVID-19 declaration under Section 564(b)(1) of the Act, 21 U.S.C.section 360bbb-3(b)(1), unless the authorization is terminated  or revoked sooner.       Influenza A by PCR NEGATIVE NEGATIVE Final   Influenza B by PCR NEGATIVE NEGATIVE Final    Comment: (NOTE) The Xpert Xpress SARS-CoV-2/FLU/RSV plus assay is intended as an aid in the diagnosis of influenza from Nasopharyngeal swab specimens and should not be used as a sole basis for treatment. Nasal washings and aspirates are unacceptable for Xpert Xpress SARS-CoV-2/FLU/RSV testing.  Fact Sheet for Patients: EntrepreneurPulse.com.au  Fact Sheet for Healthcare Providers: IncredibleEmployment.be  This test is not yet approved or cleared by the [place] FDA and has been authorized for detection and/or diagnosis of SARS-CoV-2 by FDA under an Emergency Use Authorization (EUA). This EUA will remain in effect (meaning this test can be used) for the duration of the COVID-19 declaration under Section 564(b)(1) of the Act, 21 U.S.C. section 360bbb-3(b)(1), unless the authorization is terminated or revoked.  Performed at [facility], [place] [address], [place], [place] [address]       Radiology Studies: No results found.  Scheduled Meds:  carvedilol  6.25 mg Oral BID WC   enoxaparin (LOVENOX) injection  40 mg Subcutaneous Q24H   feeding supplement  237 mL Oral BID BM   multivitamin with minerals  1 tablet Oral Daily   Continuous Infusions:   LOS: 4 days   Flora Lipps, MD [facility] If 7PM-7AM, please contact night-coverage

## 2021-09-30 NOTE — Progress Notes (Signed)
Palliative Medicine Inpatient Follow Up Note     Chart Reviewed. Patient assessed at the bedside.   Barbara Kelley is sitting upright in bed. Endorses weakness and intermittent pain which is controlled on current regimen. Continues to endorse decreased appetite but seems to be somewhat improving from days prior. Her daughter, Barbara Kelley is at the bedside ordering her dinner.    Patient and family continue to have ongoing discussions regarding goals of care and next steps. They will make further decisions once they have a definitive diagnosis from Oncology and information on treatment options. Goals at this time are to pursue SNF placement as recommended and treat the treatable.   Barbara Kelley and her daughter ask appropriate questions regarding best case and worst case scenario. She inquires further about hospice's goals, philosophy of care, and when it is appropriate to enroll. Her daughter is also inquiring about residential hospice and criteria. Education provided and all questions answered.   Patient is appreciative of opportunity to completed advanced directives today. MOST form completed today as requested. The patient and family outlined their wishes for the following treatment decisions:  Cardiopulmonary Resuscitation: Do Not Attempt Resuscitation (DNR/No CPR)  Medical Interventions: Comfort Measures: Keep clean, warm, and dry. Use medication by any route, positioning, wound care, and other measures to relieve pain and suffering. Use oxygen, suction and manual treatment of airway obstruction as needed for comfort. Do not transfer to the hospital unless comfort needs cannot be met in current location.  Antibiotics: Determine use of limitation of antibiotics when infection occurs  IV Fluids: IV fluids if indicated  Feeding Tube: No feeding tube     Discussed the importance of continued conversation with family and their  medical providers regarding overall plan of care and treatment options,  ensuring decisions are within the context of the patients values and GOCs.   Questions addressed and support provided.    Objective Assessment: Vital Signs Vitals:   09/30/21 0539 09/30/21 1305  BP: (!) 145/92 134/71  Pulse: 79 82  Resp: (!) 22 20  Temp: 98.2 F (36.8 C) 98.1 F (36.7 C)  SpO2: 94% 96%    Intake/Output Summary (Last 24 hours) at 09/30/2021 1601 Last data filed at 09/30/2021 1239 Gross per 24 hour  Intake 240 ml  Output --  Net 240 ml    Gen:  NAD HEENT: moist mucous membranes CV: Regular rate and rhythm, no murmurs rubs or gallops PULM: clear to auscultation bilaterally. No wheezes/rales/rhonchi ABD: soft/nontender/nondistended/normal bowel sounds EXT: No edema Neuro: Alert and oriented x3, mood appropriate   SUMMARY OF RECOMMENDATIONS   Continue with current plan of care per medical team Patient and family are having ongoing goals of care discussions and navigating complex decision making.  DNR/MOST form completed and on chart (see above) Recommend outpatient follow-up at minimum. If patient plans to follow-up with Oncology at [facility] she and family are aware I am available there to offer ongoing support.   PMT will continue to support and follow on as needed basis. Please secure chat for urgent needs.    Time Total: 45 min.   Visit consisted of counseling and education dealing with the complex and emotionally intense issues of symptom management and palliative care in the setting of serious and potentially life-threatening illness.Greater than 50%  of this time was spent counseling and coordinating care related to the above assessment and plan.  Alda Lea, AGPCNP-BC  Palliative Medicine Team [phone]  Palliative Medicine Team providers are available  by phone from 7am to 7pm daily and can be reached through the team cell phone. Should this patient require assistance outside of these hours, please call the patient's  attending physician.

## 2021-09-30 NOTE — Progress Notes (Signed)
Chaplain completed notarization of Barbara Kelley's healthcare POA documents.  Alessia assigned her daughter [name] as her healthcare agent.  Chaplain provided them with the original as well as another copy.  Chaplain input one copy into her medical chart.    Chaplain provided support.    09/30/21 1300  Clinical Encounter Type  Visited With Patient and family together  Visit Type Social support  Referral From Family;Nurse  Consult/Referral To Chaplain

## 2021-09-30 NOTE — TOC Progression Note (Signed)
Transition of Care [facility]) - Progression Note    Patient Details  Name: Barbara Kelley MRN: [id number] Date of Birth: 11-25-1933  Transition of Care [facility] CM/SW Contact  Gerhart Ruggieri, Juliann Pulse, RN Phone Number: 09/30/2021, 3:45 PM  Clinical Narrative:  Awaiting further w/u prior determining d/c plan.     Expected Discharge Plan: [place] Barriers to Discharge: Continued Medical Work up  Expected Discharge Plan and Services Expected Discharge Plan: [place]   Discharge Planning Services: CM Consult   Living arrangements for the past 2 months: Apartment                                       Social Determinants of Health (SDOH) Interventions    Readmission Risk Interventions No flowsheet data found.

## 2021-09-30 NOTE — Progress Notes (Signed)
Physical Therapy Treatment Patient Details Name: Barbara Kelley MRN: [id number] DOB: 09/18/1934 Today's Date: 09/30/2021   History of Present Illness Patient is a 87 year old female who presented to the hosptial after two falls at home with generalized weakness and poor oral intake. patient was found to have R breast mass,  widespread bony lesions on spine, hypercalcemia, AKI, T11, T12 compression fractures and 2nd rib fracture. PMH: GERD, HTN, HFpEF, CAD, CVA, L TKA, h/o bilateral hip replacements.    PT Comments    Pt is AxO x 3 very sweet lady with daughter in room.  Pt VERY fearful of falling and "hurting more".  Was given Tylenol just prior to session.  Assisted OOB to amb was difficult.   General bed mobility comments: with increased time and + 2 assist to decrease pt's fear, assisted from supine to EOB using bed pad.General transfer comment: pt VERY fearful so used + 2 side by side assist to stand from elevated bed and attempt amb.General Gait Details: pt required + 2 side by side assist to decrease her fear of falling.  Limited distance in room 8 feet with youth RW. Positioned in recliner to comfort.  Activity tolerance was limited by pain, weakness and fear.   Pt resides at [facility] and will need ST Rehab at SNF prior to safely returning.   Recommendations for follow up therapy are one component of a multi-disciplinary discharge planning process, led by the attending physician.  Recommendations may be updated based on patient status, additional functional criteria and insurance authorization.  Follow Up Recommendations  Skilled nursing-short term rehab (<3 hours/day)     Assistance Recommended at Discharge Frequent or constant Supervision/Assistance  Equipment Recommendations       Recommendations for Other Services       Precautions / Restrictions Precautions Precautions: Fall;Back Precaution Comments: T11 and T12 fx, 2nd rib fx, multiple lytic  lesions  spine Restrictions Weight Bearing Restrictions: No     Mobility  Bed Mobility Overal bed mobility: Needs Assistance Bed Mobility: Supine to Sit     Supine to sit: Max assist;+2 for physical assistance;+2 for safety/equipment     General bed mobility comments: with increased time and + 2 assist to decrease pt's fear, assisted from supine to EOB using bed pad.    Transfers Overall transfer level: Needs assistance Equipment used: Rolling walker (2 wheels) (youth) Transfers: Sit to/from Stand;Bed to chair/wheelchair/BSC Sit to Stand: Max assist;+2 physical assistance;+2 safety/equipment Stand pivot transfers: Total assist;+2 physical assistance;+2 safety/equipment         General transfer comment: pt VERY fearful so used + 2 side by side assist to stand from elevated bed and attempt amb.    Ambulation/Gait Ambulation/Gait assistance: Mod assist;+2 safety/equipment Gait Distance (Feet): 8 Feet Assistive device: Rolling walker (2 wheels) (youth) Gait Pattern/deviations: Step-to pattern;Decreased stance time - left;Trunk flexed Gait velocity: decreased     General Gait Details: pt required + 2 side by side assist to decrease her fear of falling.  Limited distance in room 8 feet withyouth RW.   Stairs             Wheelchair Mobility    Modified Rankin (Stroke Patients Only)       Balance  Cognition Arousal/Alertness: Awake/alert Behavior During Therapy: WFL for tasks assessed/performed Overall Cognitive Status: Within Functional Limits for tasks assessed                                 General Comments: AxO x 3 very sweet lady but also VERY fearful of falling, nervous        Exercises      General Comments        Pertinent Vitals/Pain Pain Assessment: Faces Faces Pain Scale: Hurts even more Pain Location: L "side" and  back Pain Descriptors / Indicators:  Grimacing;Sore;Guarding Pain Intervention(s): Monitored during session;Premedicated before session    Home Living                          Prior Function            PT Goals (current goals can now be found in the care plan section) Progress towards PT goals: Progressing toward goals    Frequency    Min 2X/week      PT Plan Current plan remains appropriate    Co-evaluation              AM-PAC PT "6 Clicks" Mobility   Outcome Measure  Help needed turning from your back to your side while in a flat bed without using bedrails?: A Lot Help needed moving from lying on your back to sitting on the side of a flat bed without using bedrails?: A Lot Help needed moving to and from a bed to a chair (including a wheelchair)?: A Lot Help needed standing up from a chair using your arms (e.g., wheelchair or bedside chair)?: A Lot Help needed to walk in hospital room?: A Lot Help needed climbing 3-5 steps with a railing? : Total 6 Click Score: 11    End of Session Equipment Utilized During Treatment: Gait belt Activity Tolerance: Patient limited by pain;Other (comment) (anxiety) Patient left: with call bell/phone within reach;in bed;with bed alarm set;with family/visitor present Nurse Communication: Mobility status PT Visit Diagnosis: Other abnormalities of gait and mobility (R26.89)     Time: 3729-0211 PT Time Calculation (min) (ACUTE ONLY): 25 min  Charges:  $Gait Training: 8-22 mins $Therapeutic Activity: 8-22 mins                     Rica Koyanagi  PTA Acute  Rehabilitation Services Pager      [phone] Office      [phone]

## 2021-10-01 DIAGNOSIS — N179 Acute kidney failure, unspecified: Secondary | ICD-10-CM | POA: Diagnosis not present

## 2021-10-01 DIAGNOSIS — R531 Weakness: Secondary | ICD-10-CM | POA: Diagnosis not present

## 2021-10-01 DIAGNOSIS — I5022 Chronic systolic (congestive) heart failure: Secondary | ICD-10-CM | POA: Diagnosis not present

## 2021-10-01 DIAGNOSIS — Z8673 Personal history of transient ischemic attack (TIA), and cerebral infarction without residual deficits: Secondary | ICD-10-CM | POA: Diagnosis not present

## 2021-10-01 LAB — CBC
HCT: 27.6 % — ABNORMAL LOW (ref 36.0–46.0)
Hemoglobin: 8.6 g/dL — ABNORMAL LOW (ref 12.0–15.0)
MCH: 27.1 pg (ref 26.0–34.0)
MCHC: 31.2 g/dL (ref 30.0–36.0)
MCV: 87.1 fL (ref 80.0–100.0)
Platelets: 223 10*3/uL (ref 150–400)
RBC: 3.17 MIL/uL — ABNORMAL LOW (ref 3.87–5.11)
RDW: 16.7 % — ABNORMAL HIGH (ref 11.5–15.5)
WBC: 10.7 10*3/uL — ABNORMAL HIGH (ref 4.0–10.5)
nRBC: 0 % (ref 0.0–0.2)

## 2021-10-01 LAB — PROTEIN ELECTROPHORESIS, SERUM
A/G Ratio: 1.1 (ref 0.7–1.7)
Albumin ELP: 2.7 g/dL — ABNORMAL LOW (ref 2.9–4.4)
Alpha-1-Globulin: 0.3 g/dL (ref 0.0–0.4)
Alpha-2-Globulin: 0.8 g/dL (ref 0.4–1.0)
Beta Globulin: 0.7 g/dL (ref 0.7–1.3)
Gamma Globulin: 0.8 g/dL (ref 0.4–1.8)
Globulin, Total: 2.5 g/dL (ref 2.2–3.9)
M-Spike, %: 0.2 g/dL — ABNORMAL HIGH
Total Protein ELP: 5.2 g/dL — ABNORMAL LOW (ref 6.0–8.5)

## 2021-10-01 LAB — BASIC METABOLIC PANEL
Anion gap: 6 (ref 5–15)
BUN: 21 mg/dL (ref 8–23)
CO2: 26 mmol/L (ref 22–32)
Calcium: 9.6 mg/dL (ref 8.9–10.3)
Chloride: 109 mmol/L (ref 98–111)
Creatinine, Ser: 0.98 mg/dL (ref 0.44–1.00)
GFR, Estimated: 57 mL/min — ABNORMAL LOW (ref >60)
Glucose, Bld: 104 mg/dL — ABNORMAL HIGH (ref 70–99)
Potassium: 4 mmol/L (ref 3.5–5.1)
Sodium: 141 mmol/L (ref 135–145)

## 2021-10-01 LAB — MAGNESIUM: Magnesium: 2.7 mg/dL — ABNORMAL HIGH (ref 1.7–2.4)

## 2021-10-01 MED ORDER — SALINE SPRAY 0.65 % NA SOLN
1.0000 | NASAL | Status: DC | PRN
  Administered 2021-10-01: 14:00:00 1 via NASAL
  Filled 2021-10-01: qty 44

## 2021-10-01 MED ORDER — POLYETHYLENE GLYCOL 3350 17 G PO PACK
17.0000 g | PACK | Freq: Every day | ORAL | Status: DC
  Administered 2021-10-01 – 2021-10-02 (×2): 17 g via ORAL
  Filled 2021-10-01 (×3): qty 1

## 2021-10-01 NOTE — Progress Notes (Signed)
Pt had bright red blood clot come from her nose this morning.  No additional bleeding noted.  MD notified.  Will continue to monitor.

## 2021-10-01 NOTE — Progress Notes (Signed)
Occupational Therapy Treatment Patient Details Name: Barbara Kelley MRN: [id number] DOB: 09/11/1934 Today's Date: 10/01/2021   History of present illness Patient is a 87 year old female who presented to the hosptial after two falls at home with generalized weakness and poor oral intake. patient was found to have R breast mass,  widespread bony lesions on spine, hypercalcemia, AKI, T11, T12 compression fractures and 2nd rib fracture. PMH: GERD, HTN, HFpEF, CAD, CVA, L TKA, h/o bilateral hip replacements.   OT comments  Treatment focused on UB bathing and grooming at side of bed and functional mobility. Patient max assist to transfer to side of bed. She predominantly propped with one or both arms to maintain sitting balance - holding on to stedy or placing arms on the bed. With UB bathing and grooming she held on to stedy bar with one hand at all times. For sit to stands patient mod assist to stand. Exhibited poor standing tolerance - fatiguing quickly - and tolerated for less than 30 seconds. She was able to perform ADL tasks at edge of bed for approx 15 minutes with holding on to stedy bar. Patient was total assist for transfer to recliner via stedy due to having assistance of one only.    Recommendations for follow up therapy are one component of a multi-disciplinary discharge planning process, led by the attending physician.  Recommendations may be updated based on patient status, additional functional criteria and insurance authorization.    Follow Up Recommendations  Skilled nursing-short term rehab (<3 hours/day)    Assistance Recommended at Discharge Frequent or constant Supervision/Assistance  Equipment Recommendations   (efer to next venue)    Recommendations for Other Services      Precautions / Restrictions Precautions Precautions: Fall;Back Precaution Comments: T11 and T12 fx, 2nd rib fx, multiple lytic  lesions spine Restrictions Weight Bearing Restrictions: No        Mobility            Balance Overall balance assessment: No apparent balance deficits (not formally assessed);Needs assistance Sitting-balance support: Single extremity supported Sitting balance-Leahy Scale: Poor                                     ADL either performed or assessed with clinical judgement   ADL Overall ADL's : Needs assistance/impaired     Grooming: Wash/dry hands;Sitting Grooming Details (indicate cue type and reason): sat at edge of bed to perform grooming and bathing task. Patient using one hand on stedy bar while other hand washes face, neck Upper Body Bathing: Set up;Moderate assistance Upper Body Bathing Details (indicate cue type and reason): Patient using one hand on stedy bar while other hand washes under arms and on upper body. Assistance for back. Fatigues quickly.                         Functional mobility during ADLs: +2 for safety/equipment;Moderate assistance General ADL Comments: Max assist to transfer to side of bed. Mod assist to rise from seated position with use of stedy bar.    Extremity/Trunk Assessment              Vision Baseline Vision/History: 1 Wears glasses     Perception     Praxis      Cognition Arousal/Alertness: Awake/alert Behavior During Therapy: WFL for tasks assessed/performed Overall Cognitive Status: Within Functional Limits for tasks assessed  Exercises     Shoulder Instructions       General Comments      Pertinent Vitals/ Pain       Pain Assessment: Faces Faces Pain Scale: Hurts a little bit Pain Location: Low back Pain Descriptors / Indicators: Grimacing;Sore;Guarding Pain Intervention(s): Monitored during session;Premedicated before session  Home Living                                          Prior Functioning/Environment              Frequency  Min 2X/week        Progress  Toward Goals  OT Goals(current goals can now be found in the care plan section)  Progress towards OT goals: Progressing toward goals  Acute Rehab OT Goals Patient Stated Goal: get back to ILF OT Goal Formulation: With patient/family Time For Goal Achievement: 10/11/21 Potential to Achieve Goals: Good  Plan Discharge plan remains appropriate    Co-evaluation                 AM-PAC OT "6 Clicks" Daily Activity     Outcome Measure   Help from another person eating meals?: A Little Help from another person taking care of personal grooming?: A Little Help from another person toileting, which includes using toliet, bedpan, or urinal?: A Lot Help from another person bathing (including washing, rinsing, drying)?: A Lot Help from another person to put on and taking off regular upper body clothing?: A Lot Help from another person to put on and taking off regular lower body clothing?: A Lot 6 Click Score: 14    End of Session Equipment Utilized During Treatment:  (stedy)  OT Visit Diagnosis: Unsteadiness on feet (R26.81);Muscle weakness (generalized) (M62.81);History of falling (Z91.81)   Activity Tolerance Patient tolerated treatment well   Patient Left in chair;with call bell/phone within reach;with chair alarm set;with family/visitor present   Nurse Communication Mobility status        Time: 9480-1655 OT Time Calculation (min): 26 min  Charges: OT General Charges $OT Visit: 1 Visit OT Treatments $Self Care/Home Management : 8-22 mins $Therapeutic Activity: 8-22 mins  Loreena Valeri, OTR/L Acute Care Rehab Services  Office [phone] Pager: [place] 10/01/2021, 10:13 AM

## 2021-10-01 NOTE — Plan of Care (Signed)

## 2021-10-01 NOTE — Progress Notes (Signed)
PROGRESS NOTE    Barbara Kelley  ION:[id number] DOB: 24-Oct-1933 DOA: 09/26/2021  PCP: [name], MD (Inactive)   Brief Narrative:   Patient is 87 years old female with past medical history of GERD, hypertension, coronary artery disease, heart failure with preserved ejection fraction presented to hospital with weakness, worsening chronic back pain with decreased oral intake.  Patient has had back pain for a month or so and was referred to orthopedics by her PCP as outpatient and was noted to have compression fracture.  She was considered to have kyphoplasty but when MRI was done it showed lytic lesions.  Patient was then referred to oncology but persisted to have worsening back pain and generalized weakness with hypercalcemia so he was brought into the hospital.  Patient was noted to have widespread bony lesions suggestive of metastasis.  Patient also had a breast lesion.  Myeloma labs have been sent if she has negative myeloma labs then she will probably need a biopsy of the breast mass and if it is hormonal sensitive then probably needs hormonal treatment.  Assessment & Plan:   Principal Problem:   Generalized weakness Active Problems:   History of CVA (cerebrovascular accident)   Chronic systolic heart failure (HCC)   AKI (acute kidney injury) (HCC)   Hypercalcemia   Lytic bone lesions on xray  Generalized weakness: Could be secondary to metastatic disease, hypercalcemia and volume depletion.  Received IV hydration.  Calcium improved.   Metastatic bony lesions: Imaging showed widespread Lytic lesions throughout the spine concern for malignancy / myeloma.  Did not have right breast mass possibility of breast cancer with metastasis.  Patient was supposed to see oncology as outpatient but has been seen by oncology during this hospitalization.  Waiting for her myeloma labs.  Patient wishes to confirm her diagnosis with myeloma prior to proceeding with biopsy.  I had a prolonged  conversation with Dr Alen Blew today who recommended breast biopsy but patient has been wanting to know the myeloma report prior to proceeding.  Hypercalcemia: Serum calcium on admission 11.6.  Likely secondary to lytic bony lesions.  Received Zometa.  Received IV hydration.  Calcium level improved at 9.6 at this time.  Intact PTH low at 13.  Acute kidney injury Likely secondary to decreased oral intake and hypercalcemia.  Entresto on hold.    Latest creatinine at 0.9  Chronic HFrEF: Patient received IV fluids.  Entresto and Lasix on hold at this time.  Not in acute exacerbation.    Essential hypertension: Continue Coreg.  Debility, deconditioning.  Patient has been seen by physical therapy who recommended skilled nursing facility placement.  Ethics/goals of care.  Patient has been seen by palliative care.  Currently DNR/DNI.  DVT prophylaxis: Lovenox subcu  Code Status: DNR  Family Communication:  I again spoke with the patient's daughter at length today.   Disposition Plan:   Status is: Inpatient  Remains inpatient appropriate because: Myeloma work-up, for skilled nursing facility.  Consultants:   Oncology Palliative care  Procedures:  None  Antimicrobials:  Anti-infectives (From admission, onward)    None       Subjective: Today, patient was seen and examined.  Patient complains of mild chest wall pain.  Participating with occupational therapy.  Denies any nausea vomiting fever chills.  Objective: Vitals:   09/30/21 1305 09/30/21 1828 09/30/21 1955 10/01/21 0503  BP: 134/71  (!) 147/87 (!) 160/86  Pulse: 82  86 82  Resp: 20  20 18   Temp: 98.1 F (  36.7 C)  98.9 F (37.2 C) 98.2 F (36.8 C)  TempSrc: Oral  Oral Oral  SpO2: 96%  91% 93%  Weight:  52.2 kg    Height:  4\' 10"  (1.473 m)      Intake/Output Summary (Last 24 hours) at 10/01/2021 0803 Last data filed at 10/01/2021 0500 Gross per 24 hour  Intake 240 ml  Output 700 ml  Net -460 ml    Filed  Weights   09/30/21 1828  Weight: 52.2 kg    Physical examination:  General:  Average built, not in obvious distress, deconditioned HENT:   Mild pallor noted.. Oral mucosa is moist.  Chest:  .  Diminished breath sounds bilaterally. No crackles or wheezes.  Left chest wall tenderness on palpation.   CVS: S1 &S2 heard. No murmur.  Regular rate and rhythm. Abdomen: Soft, nontender, nondistended.  Bowel sounds are heard.   Extremities: No cyanosis, clubbing or edema.  Peripheral pulses are palpable. Psych: Alert, awake and oriented, normal mood CNS:  No cranial nerve deficits.  Power equal in all extremities.   Skin: Warm and dry.  No rashes noted.  Data Reviewed: I have personally reviewed the following labs and imaging studies   CBC: Recent Labs  Lab 09/26/21 1553 09/27/21 0501 09/28/21 0531 09/29/21 0438 09/30/21 0438 10/01/21 0451  WBC 11.6* 11.5* 10.4 9.3 10.4 10.7*  NEUTROABS 6.8  --   --   --   --   --   HGB 9.5* 8.5* 8.6* 8.6* 8.6* 8.6*  HCT 30.3* 27.1* 27.6* 28.0* 27.6* 27.6*  MCV 87.3 88.3 88.2 87.8 87.3 87.1  PLT 307 288 274 254 242 947    Basic Metabolic Panel: Recent Labs  Lab 09/26/21 2143 09/27/21 0501 09/28/21 0531 09/29/21 0438 09/30/21 0438 10/01/21 0451  NA  --  140 139 140 139 141  K  --  3.5 3.7 3.9 4.0 4.0  CL  --  106 109 108 107 109  CO2  --  26 26 26 26 26   GLUCOSE  --  125* 101* 102* 97 104*  BUN  --  25* 20 17 18 21   CREATININE  --  1.21* 1.03* 1.03* 1.02* 0.98  CALCIUM  --  10.9* 10.5* 10.1 9.6 9.6  MG 2.3 2.2 2.0  --   --  2.7*  PHOS 3.4 3.7 2.7  --   --   --     GFR: Estimated Creatinine Clearance: 30.1 mL/min (by C-G formula based on SCr of 0.98 mg/dL). Liver Function Tests: Recent Labs  Lab 09/26/21 1553 09/27/21 0501  AST 35 30  ALT 12 10  ALKPHOS 137* 111  BILITOT 0.8 0.6  PROT 6.5 5.6*  ALBUMIN 3.4* 2.8*    No results for input(s): LIPASE, AMYLASE in the last 168 hours. No results for input(s): AMMONIA in the last  168 hours. Coagulation Profile: No results for input(s): INR, PROTIME in the last 168 hours. Cardiac Enzymes: No results for input(s): CKTOTAL, CKMB, CKMBINDEX, TROPONINI in the last 168 hours. BNP (last 3 results) No results for input(s): PROBNP in the last 8760 hours. HbA1C: No results for input(s): HGBA1C in the last 72 hours. CBG: No results for input(s): GLUCAP in the last 168 hours. Lipid Profile: No results for input(s): CHOL, HDL, LDLCALC, TRIG, CHOLHDL, LDLDIRECT in the last 72 hours. Thyroid Function Tests: No results for input(s): TSH, T4TOTAL, FREET4, T3FREE, THYROIDAB in the last 72 hours. Anemia Panel: No results for input(s): VITAMINB12, FOLATE, FERRITIN, TIBC, IRON,  RETICCTPCT in the last 72 hours. Sepsis Labs: No results for input(s): PROCALCITON, LATICACIDVEN in the last 168 hours.  Recent Results (from the past 240 hour(s))  Resp Panel by RT-PCR (Flu A&B, Covid) Nasopharyngeal Swab     Status: None   Collection Time: 09/26/21  3:52 PM   Specimen: Nasopharyngeal Swab; Nasopharyngeal(NP) swabs in vial transport medium  Result Value Ref Range Status   SARS Coronavirus 2 by RT PCR NEGATIVE NEGATIVE Final    Comment: (NOTE) SARS-CoV-2 target nucleic acids are NOT DETECTED.  The SARS-CoV-2 RNA is generally detectable in upper respiratory specimens during the acute phase of infection. The lowest concentration of SARS-CoV-2 viral copies this assay can detect is 138 copies/mL. A negative result does not preclude SARS-Cov-2 infection and should not be used as the sole basis for treatment or other patient management decisions. A negative result may occur with  improper specimen collection/handling, submission of specimen other than nasopharyngeal swab, presence of viral mutation(s) within the areas targeted by this assay, and inadequate number of viral copies(<138 copies/mL). A negative result must be combined with clinical observations, patient history, and  epidemiological information. The expected result is Negative.  Fact Sheet for Patients:  EntrepreneurPulse.com.au  Fact Sheet for Healthcare Providers:  IncredibleEmployment.be  This test is no t yet approved or cleared by the [place] FDA and  has been authorized for detection and/or diagnosis of SARS-CoV-2 by FDA under an Emergency Use Authorization (EUA). This EUA will remain  in effect (meaning this test can be used) for the duration of the COVID-19 declaration under Section 564(b)(1) of the Act, 21 U.S.C.section 360bbb-3(b)(1), unless the authorization is terminated  or revoked sooner.       Influenza A by PCR NEGATIVE NEGATIVE Final   Influenza B by PCR NEGATIVE NEGATIVE Final    Comment: (NOTE) The Xpert Xpress SARS-CoV-2/FLU/RSV plus assay is intended as an aid in the diagnosis of influenza from Nasopharyngeal swab specimens and should not be used as a sole basis for treatment. Nasal washings and aspirates are unacceptable for Xpert Xpress SARS-CoV-2/FLU/RSV testing.  Fact Sheet for Patients: EntrepreneurPulse.com.au  Fact Sheet for Healthcare Providers: IncredibleEmployment.be  This test is not yet approved or cleared by the [place] FDA and has been authorized for detection and/or diagnosis of SARS-CoV-2 by FDA under an Emergency Use Authorization (EUA). This EUA will remain in effect (meaning this test can be used) for the duration of the COVID-19 declaration under Section 564(b)(1) of the Act, 21 U.S.C. section 360bbb-3(b)(1), unless the authorization is terminated or revoked.  Performed at [facility], [place] [address] [place], [place] [address]       Radiology Studies: No results found.  Scheduled Meds:  carvedilol  6.25 mg Oral BID WC   enoxaparin (LOVENOX) injection  40 mg Subcutaneous Q24H   feeding supplement  237 mL Oral BID BM   multivitamin  with minerals  1 tablet Oral Daily   Continuous Infusions:   LOS: 5 days   Flora Lipps, MD [facility] If 7PM-7AM, please contact night-coverage

## 2021-10-01 NOTE — TOC Progression Note (Addendum)
Transition of Care [facility]) - Progression Note    Patient Details  Name: Barbara Kelley MRN: [id number] Date of Birth: 17-Jun-1934  [facility]) CM/SW Contact  Vallen Calabrese, Juliann Pulse, RN Phone Number: 10/01/2021, 1:06 PM  Clinical Narrative: Noted ongoing discussions w/PMT, hem onc-spoke to patient/dtr about TOC following to offer options once appropriate, currently still having ongoing discussions, & medical care-will await closer to medical stability-provided w/bed offers-voiced understanding.      Expected Discharge Plan: [place] Barriers to Discharge: Continued Medical Work up  Expected Discharge Plan and Services Expected Discharge Plan: [place]   Discharge Planning Services: CM Consult   Living arrangements for the past 2 months: Apartment                                       Social Determinants of Health (SDOH) Interventions    Readmission Risk Interventions No flowsheet data found.

## 2021-10-02 DIAGNOSIS — I5022 Chronic systolic (congestive) heart failure: Secondary | ICD-10-CM | POA: Diagnosis not present

## 2021-10-02 DIAGNOSIS — Z8673 Personal history of transient ischemic attack (TIA), and cerebral infarction without residual deficits: Secondary | ICD-10-CM | POA: Diagnosis not present

## 2021-10-02 DIAGNOSIS — R531 Weakness: Secondary | ICD-10-CM | POA: Diagnosis not present

## 2021-10-02 DIAGNOSIS — N179 Acute kidney failure, unspecified: Secondary | ICD-10-CM | POA: Diagnosis not present

## 2021-10-02 NOTE — TOC Progression Note (Addendum)
Transition of Care [facility]) - Progression Note    Patient Details  Name: Barbara Kelley MRN: [id number] Date of Birth: 1934-04-05  Transition [facility]) CM/SW Contact  Bethan Adamek, Juliann Pulse, RN Phone Number: 10/02/2021, 1:40 PM  Clinical Narrative: Patient/dtr [name] chose [facility] w/hospice-will initiate auth-await outcome.   1:55p-Initiated [name] w/[facility] Everlene Balls [name] AN#[id number] auth. 3:46p-AF w/otpt palliative care services. 5p-rcvd Lavone Neri XH#[id number], nxt review date 10/07/21.AF aware.    Expected Discharge Plan: Skilled Nursing Facility Barriers to Discharge: Insurance Authorization  Expected Discharge Plan and Services Expected Discharge Plan: [place]   Discharge Planning Services: CM Consult   Living arrangements for the past 2 months: Apartment                                       Social Determinants of Health (SDOH) Interventions    Readmission Risk Interventions No flowsheet data found.

## 2021-10-02 NOTE — Progress Notes (Signed)
PROGRESS NOTE    Barbara Kelley  QMV:[id number] DOB: Dec 07, 1933 DOA: 09/26/2021  PCP: [name], MD (Inactive)   Brief Narrative:   Patient is 87 years old female with past medical history of GERD, hypertension, coronary artery disease, heart failure with preserved ejection fraction presented to hospital with weakness, worsening chronic back pain with decreased oral intake.  Patient has had back pain for a month or so and was referred to orthopedics by her PCP as outpatient and was noted to have compression fracture.  She was considered to have kyphoplasty but when MRI was done it showed lytic lesions.  Patient was then referred to oncology but persisted to have worsening back pain and generalized weakness with hypercalcemia so he was brought into the hospital.  Patient was noted to have widespread bony lesions suggestive of metastasis.  Patient also had a breast lesion.  She was then admitted to the hospital for further evaluation and treatment.  Assessment & Plan:   Principal Problem:   Generalized weakness Active Problems:   History of CVA (cerebrovascular accident)   Chronic systolic heart failure (HCC)   AKI (acute kidney injury) (HCC)   Hypercalcemia   Lytic bone lesions on xray  Generalized weakness: Could be secondary to metastatic disease, hypercalcemia and volume depletion.  Received IV hydration.  Calcium level has improved.  Had increased oral hydration.  Metastatic bony lesions: Imaging showed widespread Lytic lesions throughout the spine concern for malignancy / myeloma.  Myeloma labs not suggestive of myeloma at this time.  Patient does have  right breast mass possibility of breast cancer with metastasis.  Communicated with Dr Alen Blew oncology.  I had a prolonged discussion with the patient as well as patient's daughter at bedside regarding further plan.  They wish to  proceed with hospice level of care.  I have communicated with TOC regarding this.  Patient does not  wish to undergo further invasive testing including biopsy.    Hypercalcemia: Serum calcium on admission 11.6.  Likely secondary to lytic bony lesions.  Received Zometa.  Received IV hydration.  Calcium level improved at 9.6 at this time.  Intact PTH low at 13.  Acute kidney injury Likely secondary to decreased oral intake and hypercalcemia.  Entresto on hold.    Latest creatinine at 0.9  Chronic HFrEF: Patient received IV fluids.  Entresto and Lasix on hold at this time.  Not in acute exacerbation.    Essential hypertension: Continue Coreg.  Closely monitor blood pressure.  Debility, deconditioning.  Patient has been seen by physical therapy who recommended skilled nursing facility placement.  Ethics/goals of care.  Patient has been seen by palliative care.  Currently DNR/DNI.  Patient would benefit from skilled nursing facility with hospice on discharge.  TOC has been consulted.  DVT prophylaxis: Lovenox subcu  Code Status: DNR  Family Communication:  I again had a prolonged discussion with the patient as well as patient's daughter at bedside    Disposition Plan:   Status is: Inpatient  Remains inpatient appropriate because: Diagnostic tests, possible hospice evaluation/possible skilled nursing facility.  Consultants:   Oncology Palliative care  Procedures:  None  Antimicrobials:  Anti-infectives (From admission, onward)    None       Subjective: Today, patient was seen and examined at bedside.  Complains of mild back and chest wall pain.  Denies any fever chills or rigor.  Objective: Vitals:   10/01/21 0503 10/01/21 1700 10/01/21 1915 10/02/21 0354  BP: (!) 160/86 (!) 147/82 Marland Kitchen)  145/103 (!) 161/92  Pulse: 82 83 99 96  Resp: 18 16 16 20   Temp: 98.2 F (36.8 C) 99.2 F (37.3 C) 99.4 F (37.4 C) 98.4 F (36.9 C)  TempSrc: Oral Oral Oral Oral  SpO2: 93% 96% 95% 95%  Weight:      Height:        Intake/Output Summary (Last 24 hours) at 10/02/2021  0823 Last data filed at 10/01/2021 0830 Gross per 24 hour  Intake 200 ml  Output 400 ml  Net -200 ml    Filed Weights   09/30/21 1828  Weight: 52.2 kg    Physical examination:  General:  Average built, not in obvious distress HENT:   Mild pallor noted.  Oral mucosa is moist.  Chest:  Clear breath sounds.  Diminished breath sounds bilaterally. No crackles or wheezes.  Left chest wall tenderness on palpation. CVS: S1 &S2 heard. No murmur.  Regular rate and rhythm. Abdomen: Soft, nontender, nondistended.  Bowel sounds are heard.   Extremities: No cyanosis, clubbing or edema.  Peripheral pulses are palpable. Psych: Alert, awake and oriented, normal mood CNS:  No cranial nerve deficits.  Power equal in all extremities.   Skin: Warm and dry.  No rashes noted.   Data Reviewed: I have personally reviewed the following labs and imaging studies   CBC: Recent Labs  Lab 09/26/21 1553 09/27/21 0501 09/28/21 0531 09/29/21 0438 09/30/21 0438 10/01/21 0451  WBC 11.6* 11.5* 10.4 9.3 10.4 10.7*  NEUTROABS 6.8  --   --   --   --   --   HGB 9.5* 8.5* 8.6* 8.6* 8.6* 8.6*  HCT 30.3* 27.1* 27.6* 28.0* 27.6* 27.6*  MCV 87.3 88.3 88.2 87.8 87.3 87.1  PLT 307 288 274 254 242 268    Basic Metabolic Panel: Recent Labs  Lab 09/26/21 2143 09/27/21 0501 09/28/21 0531 09/29/21 0438 09/30/21 0438 10/01/21 0451  NA  --  140 139 140 139 141  K  --  3.5 3.7 3.9 4.0 4.0  CL  --  106 109 108 107 109  CO2  --  26 26 26 26 26   GLUCOSE  --  125* 101* 102* 97 104*  BUN  --  25* 20 17 18 21   CREATININE  --  1.21* 1.03* 1.03* 1.02* 0.98  CALCIUM  --  10.9* 10.5* 10.1 9.6 9.6  MG 2.3 2.2 2.0  --   --  2.7*  PHOS 3.4 3.7 2.7  --   --   --     GFR: Estimated Creatinine Clearance: 30.1 mL/min (by C-G formula based on SCr of 0.98 mg/dL). Liver Function Tests: Recent Labs  Lab 09/26/21 1553 09/27/21 0501  AST 35 30  ALT 12 10  ALKPHOS 137* 111  BILITOT 0.8 0.6  PROT 6.5 5.6*  ALBUMIN 3.4*  2.8*    No results for input(s): LIPASE, AMYLASE in the last 168 hours. No results for input(s): AMMONIA in the last 168 hours. Coagulation Profile: No results for input(s): INR, PROTIME in the last 168 hours. Cardiac Enzymes: No results for input(s): CKTOTAL, CKMB, CKMBINDEX, TROPONINI in the last 168 hours. BNP (last 3 results) No results for input(s): PROBNP in the last 8760 hours. HbA1C: No results for input(s): HGBA1C in the last 72 hours. CBG: No results for input(s): GLUCAP in the last 168 hours. Lipid Profile: No results for input(s): CHOL, HDL, LDLCALC, TRIG, CHOLHDL, LDLDIRECT in the last 72 hours. Thyroid Function Tests: No results for input(s): TSH, T4TOTAL,  FREET4, T3FREE, THYROIDAB in the last 72 hours. Anemia Panel: No results for input(s): VITAMINB12, FOLATE, FERRITIN, TIBC, IRON, RETICCTPCT in the last 72 hours. Sepsis Labs: No results for input(s): PROCALCITON, LATICACIDVEN in the last 168 hours.  Recent Results (from the past 240 hour(s))  Resp Panel by RT-PCR (Flu A&B, Covid) Nasopharyngeal Swab     Status: None   Collection Time: 09/26/21  3:52 PM   Specimen: Nasopharyngeal Swab; Nasopharyngeal(NP) swabs in vial transport medium  Result Value Ref Range Status   SARS Coronavirus 2 by RT PCR NEGATIVE NEGATIVE Final    Comment: (NOTE) SARS-CoV-2 target nucleic acids are NOT DETECTED.  The SARS-CoV-2 RNA is generally detectable in upper respiratory specimens during the acute phase of infection. The lowest concentration of SARS-CoV-2 viral copies this assay can detect is 138 copies/mL. A negative result does not preclude SARS-Cov-2 infection and should not be used as the sole basis for treatment or other patient management decisions. A negative result may occur with  improper specimen collection/handling, submission of specimen other than nasopharyngeal swab, presence of viral mutation(s) within the areas targeted by this assay, and inadequate number of  viral copies(<138 copies/mL). A negative result must be combined with clinical observations, patient history, and epidemiological information. The expected result is Negative.  Fact Sheet for Patients:  EntrepreneurPulse.com.au  Fact Sheet for Healthcare Providers:  IncredibleEmployment.be  This test is no t yet approved or cleared by the [place] FDA and  has been authorized for detection and/or diagnosis of SARS-CoV-2 by FDA under an Emergency Use Authorization (EUA). This EUA will remain  in effect (meaning this test can be used) for the duration of the COVID-19 declaration under Section 564(b)(1) of the Act, 21 U.S.C.section 360bbb-3(b)(1), unless the authorization is terminated  or revoked sooner.       Influenza A by PCR NEGATIVE NEGATIVE Final   Influenza B by PCR NEGATIVE NEGATIVE Final    Comment: (NOTE) The Xpert Xpress SARS-CoV-2/FLU/RSV plus assay is intended as an aid in the diagnosis of influenza from Nasopharyngeal swab specimens and should not be used as a sole basis for treatment. Nasal washings and aspirates are unacceptable for Xpert Xpress SARS-CoV-2/FLU/RSV testing.  Fact Sheet for Patients: EntrepreneurPulse.com.au  Fact Sheet for Healthcare Providers: IncredibleEmployment.be  This test is not yet approved or cleared by the [place] FDA and has been authorized for detection and/or diagnosis of SARS-CoV-2 by FDA under an Emergency Use Authorization (EUA). This EUA will remain in effect (meaning this test can be used) for the duration of the COVID-19 declaration under Section 564(b)(1) of the Act, 21 U.S.C. section 360bbb-3(b)(1), unless the authorization is terminated or revoked.  Performed at [facility], [place] [address] [place], [place] [address]       Radiology Studies: No results found.  Scheduled Meds:  carvedilol  6.25 mg Oral BID WC    enoxaparin (LOVENOX) injection  40 mg Subcutaneous Q24H   feeding supplement  237 mL Oral BID BM   multivitamin with minerals  1 tablet Oral Daily   polyethylene glycol  17 g Oral Daily   Continuous Infusions:   LOS: 6 days   Flora Lipps, MD [facility] If 7PM-7AM, please contact night-coverage

## 2021-10-03 DIAGNOSIS — R2689 Other abnormalities of gait and mobility: Secondary | ICD-10-CM | POA: Diagnosis not present

## 2021-10-03 DIAGNOSIS — Z8673 Personal history of transient ischemic attack (TIA), and cerebral infarction without residual deficits: Secondary | ICD-10-CM | POA: Diagnosis not present

## 2021-10-03 DIAGNOSIS — M899 Disorder of bone, unspecified: Secondary | ICD-10-CM | POA: Diagnosis not present

## 2021-10-03 DIAGNOSIS — C7951 Secondary malignant neoplasm of bone: Secondary | ICD-10-CM | POA: Diagnosis not present

## 2021-10-03 DIAGNOSIS — I5022 Chronic systolic (congestive) heart failure: Secondary | ICD-10-CM | POA: Diagnosis not present

## 2021-10-03 DIAGNOSIS — R41841 Cognitive communication deficit: Secondary | ICD-10-CM | POA: Diagnosis not present

## 2021-10-03 DIAGNOSIS — C50911 Malignant neoplasm of unspecified site of right female breast: Secondary | ICD-10-CM | POA: Diagnosis not present

## 2021-10-03 DIAGNOSIS — R5381 Other malaise: Secondary | ICD-10-CM | POA: Diagnosis not present

## 2021-10-03 DIAGNOSIS — N179 Acute kidney failure, unspecified: Secondary | ICD-10-CM | POA: Diagnosis not present

## 2021-10-03 DIAGNOSIS — I10 Essential (primary) hypertension: Secondary | ICD-10-CM | POA: Diagnosis not present

## 2021-10-03 DIAGNOSIS — M6281 Muscle weakness (generalized): Secondary | ICD-10-CM | POA: Diagnosis not present

## 2021-10-03 DIAGNOSIS — I69891 Dysphagia following other cerebrovascular disease: Secondary | ICD-10-CM | POA: Diagnosis not present

## 2021-10-03 DIAGNOSIS — R531 Weakness: Secondary | ICD-10-CM | POA: Diagnosis not present

## 2021-10-03 DIAGNOSIS — R1312 Dysphagia, oropharyngeal phase: Secondary | ICD-10-CM | POA: Diagnosis not present

## 2021-10-03 DIAGNOSIS — I69828 Other speech and language deficits following other cerebrovascular disease: Secondary | ICD-10-CM | POA: Diagnosis not present

## 2021-10-03 LAB — SARS CORONAVIRUS 2 (TAT 6-24 HRS): SARS Coronavirus 2: NEGATIVE

## 2021-10-03 MED ORDER — ENSURE ENLIVE PO LIQD: 237.0000 mL | Freq: Two times a day (BID) | ORAL | Status: DC

## 2021-10-03 MED ORDER — ONDANSETRON HCL 4 MG PO TABS: 4.0000 mg | ORAL_TABLET | Freq: Four times a day (QID) | ORAL | 0 refills | Status: AC | PRN

## 2021-10-03 MED ORDER — OXYCODONE HCL 5 MG PO TABS: 5.0000 mg | ORAL_TABLET | ORAL | 0 refills | Status: DC | PRN

## 2021-10-03 MED ORDER — POLYETHYLENE GLYCOL 3350 17 G PO PACK: 17.0000 g | PACK | Freq: Every day | ORAL | 0 refills | Status: AC

## 2021-10-03 NOTE — TOC Transition Note (Signed)
Transition of Care [facility]) - CM/SW Discharge Note   Patient Details  Name: Barbara Kelley MRN: [id number] Date of Birth: April 14, 1934  [facility]) CM/SW Contact:  Dessa Phi, RN Phone Number: 10/03/2021, 11:36 AM   Clinical Narrative: spoke to dtr [name]-she will tranport on own to [name] aware-going to rm#[phone],nsg has already called report. Packet w/Nsg to give to dtr.No further CM needs.      Final next level of care: Skilled Nursing Facility Barriers to Discharge: No Barriers Identified   Patient Goals and CMS Choice Patient states their goals for this hospitalization and ongoing recovery are:: to eventually go home CMS Medicare.gov Compare Post Acute Care list provided to:: Patient    Discharge Placement                       Discharge Plan and Services   Discharge Planning Services: CM Consult                                 Social Determinants of Health (SDOH) Interventions     Readmission Risk Interventions No flowsheet data found.

## 2021-10-03 NOTE — Progress Notes (Signed)
Order to discharge pt Barbara Kelley.  Discharge instructions/AVS placed in discharge packet.  Report called to [facility] at [facility].  Family at bedside and aware of discharge.

## 2021-10-03 NOTE — Progress Notes (Signed)
Pt transported to [facility] by daughter.  Discharge packet given to daughter to give to facility.

## 2021-10-03 NOTE — Progress Notes (Signed)
Notified by CCMD that pt had 5 beats of v-tach.  Notified MD.  Will continue to follow.

## 2021-10-03 NOTE — Plan of Care (Signed)

## 2021-10-03 NOTE — Discharge Summary (Signed)
Physician Discharge Summary  REMELL GIAIMO NLZ:[id number] DOB: 08-18-1934 DOA: 09/26/2021  PCP: [name], MD (Inactive)  Admit date: 09/26/2021 Discharge date: 10/03/2021  Admitted From: Home  Discharge disposition: SNF  Recommendations for Outpatient Follow-Up:   Follow up with your primary care provider in one week.  Check CBC, BMP, magnesium in the next visit Patient will benefit from outpatient palliative care services for her history of possible metastatic cancer.  Discharge Diagnosis:   Principal Problem:   Generalized weakness Active Problems:   History of CVA (cerebrovascular accident)   Chronic systolic heart failure (HCC)   AKI (acute kidney injury) ([facility])   Hypercalcemia   Lytic bone lesions on xray   Discharge Condition: Improved.  Diet recommendation: Mechanical soft diet   Wound care: None.  Code status: DNR   History of Present Illness:   Patient is 87 years old female with past medical history of GERD, hypertension, coronary artery disease, heart failure with preserved ejection fraction presented to hospital with weakness, worsening chronic back pain with decreased oral intake.  Patient has had back pain for a month or so and was referred to orthopedics by her PCP as outpatient and was noted to have compression fracture.  She was considered to have kyphoplasty but when MRI was done it showed lytic lesions.  Patient was then referred to oncology but persisted to have worsening back pain and generalized weakness with hypercalcemia so he was brought into the hospital.  Patient was noted to have widespread bony lesions suggestive of metastasis.  Patient also had a breast lesion.  She was then admitted to the hospital for further evaluation and treatment.   Hospital Course:   Following conditions were addressed during hospitalization as listed below,  Generalized weakness: Likely secondary to metastatic disease, hypercalcemia and volume  depletion.  Received IV hydration.  Calcium level has improved.  Encourage oral hydration.   Metastatic bony lesions: Imaging showed widespread Lytic lesions throughout the spine concern for malignancy / myeloma.  Myeloma labs not suggestive of myeloma at this time. Patient does have  right breast mass possibility of breast cancer with metastasis.  Communicated with Dr Alen Blew oncology.  I had a prolonged discussion with the patient as well as patient's daughter at bedside regarding further plan.  Family wishes to proceed with palliative care services on discharge.  No plans for biopsy at this time.   Hypercalcemia: Serum calcium on admission 11.6.  Likely secondary to lytic bony lesions.  Received Zometa.  Received IV hydration.  Calcium level improved at 9.6 at this time.  Intact PTH low at 13.   Acute kidney injury Likely secondary to decreased oral intake and hypercalcemia.    Latest creatinine at 0.9   Chronic HFrEF: Patient received IV fluids during hospitalization .  Entresto and Lasix will be resumed on discharge.  Not in acute exacerbation.     Essential hypertension: Continue Coreg.     Debility, deconditioning.  Patient has been seen by physical therapy who recommended skilled nursing facility placement on discharge..   Ethics/goals of care.  Patient has been seen by palliative care.  Currently DNR/DNI.  Patient would benefit from skilled nursing facility with palliative care as outpatient  Disposition.  At this time, patient is stable for disposition to skilled nursing facility with outpatient palliative care.  Medical Consultants:   Oncology Palliative care  Procedures:    None Subjective:   Today, patient was seen and examined at bedside.  Denies overt pain.  Complains of generalized weakness.  Denies any nausea vomiting.  Has had a bowel movement.  States that she has since been eating okay  Discharge Exam:   Vitals:   10/02/21 1941 10/03/21 0445  BP: (!) 143/86  (!) 157/80  Pulse: 72 77  Resp: 18 16  Temp: 99.1 F (37.3 C) 98.2 F (36.8 C)  SpO2: 94% 95%   Vitals:   10/02/21 0354 10/02/21 1302 10/02/21 1941 10/03/21 0445  BP: (!) 161/92 125/61 (!) 143/86 (!) 157/80  Pulse: 96 81 72 77  Resp: _0 Temp: 98.4 F (36.9 C) 98.2 F (36.8 C) 99.1 F (37.3 C) 98.2 F (36.8 C)  TempSrc: Oral Oral Oral Oral  SpO2: 95% 95% 94% 95%  Weight:      Height:        General: Alert awake, not in obvious distress, HENT: pupils equally reacting to light, mild pallor noted.  Oral mucosa is moist.  Chest: .  Diminished breath sounds bilaterally. No crackles or wheezes.  Left chest wall tenderness on palpation. CVS: S1 &S2 heard. No murmur.  Regular rate and rhythm. Abdomen: Soft, nontender, nondistended.  Bowel sounds are heard.   Extremities: No cyanosis, clubbing or edema.  Peripheral pulses are palpable. Psych: Alert, awake and oriented, normal mood CNS:  No cranial nerve deficits.  Moves all extremities. Skin: Warm and dry.  No rashes noted.  The results of significant diagnostics from this hospitalization (including imaging, microbiology, ancillary and laboratory) are listed below for reference.     Diagnostic Studies:   CT ABDOMEN PELVIS WO CONTRAST  Result Date: 09/26/2021 CLINICAL DATA:  Status post fall. Recent diagnosis cervical vertebrae fracture and cancer unknown primary Unable to walk well, change in mental status EXAM: CT CHEST, ABDOMEN AND PELVIS WITHOUT CONTRAST TECHNIQUE: Multidetector CT imaging of the chest, abdomen and pelvis was performed following the standard protocol without IV contrast. COMPARISON:  CT chest abdomen pelvis 09/18/2016. FINDINGS: CHEST: Ports and Devices: None. Lungs/airways: Likely atelectasis of the inferior right upper lobe. No focal consolidation. Subpleural 0.8 x 0.7 and 0.4 x 0.3 cm nodules at the right base (3:103). No pulmonary mass. No pulmonary contusion or laceration. No pneumatocele formation.  The central airways are patent. Pleura: Trace bilateral pleural effusions. No pneumothorax. No hemothorax. Lymph Nodes: Limited evaluation for hilar lymphadenopathy on this noncontrast study. No mediastinal lymphadenopathy. Right axillary lymphadenopathy: 1.7 cm (2:14). No left axillary adenopathy. Mediastinum: No pneumomediastinum. The thoracic aorta is normal in caliber. At least moderate atherosclerotic plaque. The heart is normal in size. No significant pericardial effusion. The esophagus is unremarkable. The thyroid is unremarkable. Chest Wall / Breasts: Right breast mass measuring 3.2 x 2.6 cm (2:33). Musculoskeletal: Diffusely decreased bone density with question underlying diffuse lytic lesions involving both the cortex and the medulla of the axial and appendicular skeleton.No definite acute displaced rib or sternal fracture. Bilateral multiple subacute to chronic rib fractures. Please see separately dictated CT thoracolumbar spine 09/26/2021. ABDOMEN / PELVIS: Liver: Not enlarged. No focal lesion. Biliary System: The gallbladder is otherwise unremarkable with no radio-opaque gallstones. No biliary ductal dilatation. Pancreas: Normal pancreatic contour. No main pancreatic duct dilatation. Spleen: Not enlarged. No focal lesion. Adrenal Glands: A 1.5 cm lesion with a density of 2 Hounsfield units within the right adrenal gland likely represents an adenoma. No left adrenal nodularity. Kidneys: No hydroureteronephrosis. No nephroureterolithiasis. No contour deforming renal mass. The urinary bladder is unremarkable. Bowel: No small or large bowel wall thickening or  dilatation. Diffuse colonic diverticulosis. The appendix is unremarkable. Mesentery, Omentum, and Peritoneum: No simple free fluid ascites. No pneumoperitoneum. No mesenteric hematoma identified. No organized fluid collection. Pelvic Organs: Not well visualized due to streak artifact originating from bilateral femoral surgical hardware. Likely  hysterectomy. Lymph Nodes: No abdominal, pelvic, inguinal lymphadenopathy. Vasculature: No abdominal aorta or iliac aneurysm. Musculoskeletal: No significant soft tissue hematoma. There is a superior right of midline superficial back soft tissue density lesion measuring 3 x 1.5 cm. Diffusely decreased bone density with question underlying diffuse lytic lesions involving both the cortex and the medulla of the axial and appendicular skeleton. Bilateral total hip arthroplasty. No definite acute displaced pelvic fracture. Please see separately dictated CT thoracolumbar spine 09/26/2021. IMPRESSION: 1. No acute traumatic injury to the chest, abdomen, or pelvis with markedly limited evaluation on this noncontrast study. 2. No acute displaced rib fracture. Bilateral multiple subacute to chronic rib fractures. 3. Please see separately dictated CT thoracolumbar spine 09/26/2021. Other imaging findings of potential clinical significance: 1. A 3.2 x 2.6 cm right breast mass as well as right axilla lymphadenopathy suggestive of malignancy. Correlate with diagnostic mammography. If possible, please obtain diagnostic mammography appointment for next business day. 2. Diffusely decreased bone density with underlying diffuse lytic lesions of both the cortex and medulla of the axial and appendicular skeleton suggestive with metastases. 3. Indeterminate 0.8 x 0.7 and 0.4 x 0.3 cm right base subpleural nodules. 4. Nonspecific superior right of midline superficial back soft tissue density lesion measuring 3 x 1.5 cm. Correlate with physical exam. 5. Colonic diverticulosis with no acute diverticulitis. Electronically Signed   By: Iven Finn M.D.   On: 09/26/2021 18:07   DG Pelvis 1-2 Views  Result Date: 09/26/2021 CLINICAL DATA:  Left hip pain after fall. EXAM: PELVIS - 1-2 VIEW COMPARISON:  Pelvis x-ray dated October 20, 2017. FINDINGS: Prior bilateral hip arthroplasties. No evidence of hardware failure or loosening. No acute  fracture or dislocation. Osteopenia. Soft tissues are unremarkable. IMPRESSION: 1. No acute osseous abnormality. Electronically Signed   By: Titus Dubin M.D.   On: 09/26/2021 11:41   CT Head Wo Contrast  Result Date: 09/26/2021 CLINICAL DATA:  Dizziness, fall last night EXAM: CT HEAD WITHOUT CONTRAST TECHNIQUE: Contiguous axial images were obtained from the base of the skull through the vertex without intravenous contrast. COMPARISON:  10/09/2019 FINDINGS: Brain: Small remote infarct of the right inferior cerebellum, unchanged. Remote infarcts involving the head of the left caudate nucleus and anterior limb of the left internal capsule likewise appear unchanged. Periventricular white matter and corona radiata hypodensities favor chronic ischemic microvascular white matter disease. Otherwise, the brainstem, cerebellum, cerebral peduncles, thalamus, basal ganglia, basilar cisterns, and ventricular system appear within normal limits. No intracranial hemorrhage, mass lesion, or acute CVA. Vascular: Unremarkable Skull: No acute findings. Small right frontal osteoma is only about 3 mm in short axis. Sinuses/Orbits: Mild chronic bilateral maxillary sinusitis. Other: No supplemental non-categorized findings. IMPRESSION: 1. No acute intracranial findings. 2. Small remote infarcts of the right inferior cerebellum and left caudate head/anterior limb left internal capsule. 3. Periventricular white matter and corona radiata hypodensities favor chronic ischemic microvascular white matter disease. 4. Mild chronic bilateral maxillary sinusitis. Electronically Signed   By: Van Clines M.D.   On: 09/26/2021 11:40   CT CHEST WO CONTRAST  Result Date: 09/26/2021 CLINICAL DATA:  Status post fall. Recent diagnosis cervical vertebrae fracture and cancer unknown primary Unable to walk well, change in mental status EXAM: CT CHEST, ABDOMEN  AND PELVIS WITHOUT CONTRAST TECHNIQUE: Multidetector CT imaging of the chest,  abdomen and pelvis was performed following the standard protocol without IV contrast. COMPARISON:  CT chest abdomen pelvis 09/18/2016. FINDINGS: CHEST: Ports and Devices: None. Lungs/airways: Likely atelectasis of the inferior right upper lobe. No focal consolidation. Subpleural 0.8 x 0.7 and 0.4 x 0.3 cm nodules at the right base (3:103). No pulmonary mass. No pulmonary contusion or laceration. No pneumatocele formation. The central airways are patent. Pleura: Trace bilateral pleural effusions. No pneumothorax. No hemothorax. Lymph Nodes: Limited evaluation for hilar lymphadenopathy on this noncontrast study. No mediastinal lymphadenopathy. Right axillary lymphadenopathy: 1.7 cm (2:14). No left axillary adenopathy. Mediastinum: No pneumomediastinum. The thoracic aorta is normal in caliber. At least moderate atherosclerotic plaque. The heart is normal in size. No significant pericardial effusion. The esophagus is unremarkable. The thyroid is unremarkable. Chest Wall / Breasts: Right breast mass measuring 3.2 x 2.6 cm (2:33). Musculoskeletal: Diffusely decreased bone density with question underlying diffuse lytic lesions involving both the cortex and the medulla of the axial and appendicular skeleton.No definite acute displaced rib or sternal fracture. Bilateral multiple subacute to chronic rib fractures. Please see separately dictated CT thoracolumbar spine 09/26/2021. ABDOMEN / PELVIS: Liver: Not enlarged. No focal lesion. Biliary System: The gallbladder is otherwise unremarkable with no radio-opaque gallstones. No biliary ductal dilatation. Pancreas: Normal pancreatic contour. No main pancreatic duct dilatation. Spleen: Not enlarged. No focal lesion. Adrenal Glands: A 1.5 cm lesion with a density of 2 Hounsfield units within the right adrenal gland likely represents an adenoma. No left adrenal nodularity. Kidneys: No hydroureteronephrosis. No nephroureterolithiasis. No contour deforming renal mass. The urinary  bladder is unremarkable. Bowel: No small or large bowel wall thickening or dilatation. Diffuse colonic diverticulosis. The appendix is unremarkable. Mesentery, Omentum, and Peritoneum: No simple free fluid ascites. No pneumoperitoneum. No mesenteric hematoma identified. No organized fluid collection. Pelvic Organs: Not well visualized due to streak artifact originating from bilateral femoral surgical hardware. Likely hysterectomy. Lymph Nodes: No abdominal, pelvic, inguinal lymphadenopathy. Vasculature: No abdominal aorta or iliac aneurysm. Musculoskeletal: No significant soft tissue hematoma. There is a superior right of midline superficial back soft tissue density lesion measuring 3 x 1.5 cm. Diffusely decreased bone density with question underlying diffuse lytic lesions involving both the cortex and the medulla of the axial and appendicular skeleton. Bilateral total hip arthroplasty. No definite acute displaced pelvic fracture. Please see separately dictated CT thoracolumbar spine 09/26/2021. IMPRESSION: 1. No acute traumatic injury to the chest, abdomen, or pelvis with markedly limited evaluation on this noncontrast study. 2. No acute displaced rib fracture. Bilateral multiple subacute to chronic rib fractures. 3. Please see separately dictated CT thoracolumbar spine 09/26/2021. Other imaging findings of potential clinical significance: 1. A 3.2 x 2.6 cm right breast mass as well as right axilla lymphadenopathy suggestive of malignancy. Correlate with diagnostic mammography. If possible, please obtain diagnostic mammography appointment for next business day. 2. Diffusely decreased bone density with underlying diffuse lytic lesions of both the cortex and medulla of the axial and appendicular skeleton suggestive with metastases. 3. Indeterminate 0.8 x 0.7 and 0.4 x 0.3 cm right base subpleural nodules. 4. Nonspecific superior right of midline superficial back soft tissue density lesion measuring 3 x 1.5 cm.  Correlate with physical exam. 5. Colonic diverticulosis with no acute diverticulitis. Electronically Signed   By: Iven Finn M.D.   On: 09/26/2021 18:07   CT T-SPINE NO CHARGE  Result Date: 09/26/2021 CLINICAL DATA:  Recent diagnosis cervical vertebrae fracture  and cancer unknown primary. Unable to walk well, change in mental status EXAM: CT THORACIC AND LUMBAR SPINE WITHOUT CONTRAST TECHNIQUE: Multidetector CT imaging of the thoracic and lumbar spine was performed without contrast. Multiplanar CT image reconstructions were also generated. COMPARISON:  None. FINDINGS: CT THORACIC SPINE FINDINGS Alignment: Normal Vertebrae: Extensive and widespread lytic lesions throughout the entire thoracic spine. Similar lesions also present in multiple ribs. Probable myeloma Compression fractures of T11 and T12. Bony retropulsion of the T12 fracture into the canal causing moderate spinal stenosis. This appears to be a pathologic fracture. Fracture of the left second rib anteriorly may be acute. Paraspinal and other soft tissues: Mild atherosclerotic calcification aortic arch. Pulmonary artery enlargement. Mild pleural thickening bilaterally inferiorly. Visualized lungs are clear without infiltrate or mass. Disc levels: Disc degeneration throughout the thoracic spine. No significant spinal stenosis. CT LUMBAR SPINE FINDINGS Segmentation: 5 lumbar segments. Alignment: Normal Vertebrae: Negative for lumbar fracture. Widespread lytic lesions throughout the lumbar vertebra similar to that seen in the thoracic spine. Paraspinal and other soft tissues: Atherosclerotic calcification aorta and iliac arteries. No aneurysm. Mild paraspinous edema around the T12 fracture. No retroperitoneal adenopathy. Disc levels: L1-2: Moderate spinal stenosis with disc and facet degeneration L2-3: Mild to moderate spinal stenosis due to disc bulging and facet hypertrophy L3-4: Mild spinal stenosis due to disc and facet degeneration. L4-5: Mild  spinal stenosis due to disc and facet degeneration L5-S1: Bilateral facet hypertrophy and disc bulging. Mild subarticular stenosis bilaterally IMPRESSION: CT THORACIC SPINE IMPRESSION 1. Widespread lytic lesions throughout the thoracic spine compatible with multiple myeloma. Fractures of T11 and T12. Bony retropulsion of the T12 fracture into the canal causing moderate spinal stenosis. 2. Fracture of the left second rib anteriorly may be acute. Multiple rib lesions compatible with myeloma. CT LUMBAR SPINE IMPRESSION 1. Widespread lytic lesions throughout the lumbar spine compatible with myeloma. No fracture. 2. Multilevel lumbar degenerative change. Moderate spinal stenosis L1-2. Electronically Signed   By: Franchot Gallo M.D.   On: 09/26/2021 18:04   CT L-SPINE NO CHARGE  Result Date: 09/26/2021 CLINICAL DATA:  Recent diagnosis cervical vertebrae fracture and cancer unknown primary. Unable to walk well, change in mental status EXAM: CT THORACIC AND LUMBAR SPINE WITHOUT CONTRAST TECHNIQUE: Multidetector CT imaging of the thoracic and lumbar spine was performed without contrast. Multiplanar CT image reconstructions were also generated. COMPARISON:  None. FINDINGS: CT THORACIC SPINE FINDINGS Alignment: Normal Vertebrae: Extensive and widespread lytic lesions throughout the entire thoracic spine. Similar lesions also present in multiple ribs. Probable myeloma Compression fractures of T11 and T12. Bony retropulsion of the T12 fracture into the canal causing moderate spinal stenosis. This appears to be a pathologic fracture. Fracture of the left second rib anteriorly may be acute. Paraspinal and other soft tissues: Mild atherosclerotic calcification aortic arch. Pulmonary artery enlargement. Mild pleural thickening bilaterally inferiorly. Visualized lungs are clear without infiltrate or mass. Disc levels: Disc degeneration throughout the thoracic spine. No significant spinal stenosis. CT LUMBAR SPINE FINDINGS  Segmentation: 5 lumbar segments. Alignment: Normal Vertebrae: Negative for lumbar fracture. Widespread lytic lesions throughout the lumbar vertebra similar to that seen in the thoracic spine. Paraspinal and other soft tissues: Atherosclerotic calcification aorta and iliac arteries. No aneurysm. Mild paraspinous edema around the T12 fracture. No retroperitoneal adenopathy. Disc levels: L1-2: Moderate spinal stenosis with disc and facet degeneration L2-3: Mild to moderate spinal stenosis due to disc bulging and facet hypertrophy L3-4: Mild spinal stenosis due to disc and facet degeneration. L4-5: Mild spinal  stenosis due to disc and facet degeneration L5-S1: Bilateral facet hypertrophy and disc bulging. Mild subarticular stenosis bilaterally IMPRESSION: CT THORACIC SPINE IMPRESSION 1. Widespread lytic lesions throughout the thoracic spine compatible with multiple myeloma. Fractures of T11 and T12. Bony retropulsion of the T12 fracture into the canal causing moderate spinal stenosis. 2. Fracture of the left second rib anteriorly may be acute. Multiple rib lesions compatible with myeloma. CT LUMBAR SPINE IMPRESSION 1. Widespread lytic lesions throughout the lumbar spine compatible with myeloma. No fracture. 2. Multilevel lumbar degenerative change. Moderate spinal stenosis L1-2. Electronically Signed   By: Franchot Gallo M.D.   On: 09/26/2021 18:04     Labs:   Basic Metabolic Panel: Recent Labs  Lab 09/26/21 2143 09/27/21 0501 09/28/21 0531 09/29/21 0438 09/30/21 0438 10/01/21 0451  NA  --  140 139 140 139 141  K  --  3.5 3.7 3.9 4.0 4.0  CL  --  106 109 108 107 109  CO2  --  _0 GLUCOSE  --  125* 101* 102* 97 104*  BUN  --  25* _1 CREATININE  --  1.21* 1.03* 1.03* 1.02* 0.98  CALCIUM  --  10.9* 10.5* 10.1 9.6 9.6  MG 2.3 2.2 2.0  --   --  2.7*  PHOS 3.4 3.7 2.7  --   --   --    GFR Estimated Creatinine Clearance: 30.1 mL/min (by C-G formula based on SCr of 0.98  mg/dL). Liver Function Tests: Recent Labs  Lab 09/26/21 1553 09/27/21 0501  AST 35 30  ALT 12 10  ALKPHOS 137* 111  BILITOT 0.8 0.6  PROT 6.5 5.6*  ALBUMIN 3.4* 2.8*   No results for input(s): LIPASE, AMYLASE in the last 168 hours. No results for input(s): AMMONIA in the last 168 hours. Coagulation profile No results for input(s): INR, PROTIME in the last 168 hours.  CBC: Recent Labs  Lab 09/26/21 1553 09/27/21 0501 09/28/21 0531 09/29/21 0438 09/30/21 0438 10/01/21 0451  WBC 11.6* 11.5* 10.4 9.3 10.4 10.7*  NEUTROABS 6.8  --   --   --   --   --   HGB 9.5* 8.5* 8.6* 8.6* 8.6* 8.6*  HCT 30.3* 27.1* 27.6* 28.0* 27.6* 27.6*  MCV 87.3 88.3 88.2 87.8 87.3 87.1  PLT 307 288 274 254 242 223   Cardiac Enzymes: No results for input(s): CKTOTAL, CKMB, CKMBINDEX, TROPONINI in the last 168 hours. BNP: Invalid input(s): POCBNP CBG: No results for input(s): GLUCAP in the last 168 hours. D-Dimer No results for input(s): DDIMER in the last 72 hours. Hgb A1c No results for input(s): HGBA1C in the last 72 hours. Lipid Profile No results for input(s): CHOL, HDL, LDLCALC, TRIG, CHOLHDL, LDLDIRECT in the last 72 hours. Thyroid function studies No results for input(s): TSH, T4TOTAL, T3FREE, THYROIDAB in the last 72 hours.  Invalid input(s): FREET3 Anemia work up No results for input(s): VITAMINB12, FOLATE, FERRITIN, TIBC, IRON, RETICCTPCT in the last 72 hours. Microbiology Recent Results (from the past 240 hour(s))  Resp Panel by RT-PCR (Flu A&B, Covid) Nasopharyngeal Swab     Status: None   Collection Time: 09/26/21  3:52 PM   Specimen: Nasopharyngeal Swab; Nasopharyngeal(NP) swabs in vial transport medium  Result Value Ref Range Status   SARS Coronavirus 2 by RT PCR NEGATIVE NEGATIVE Final    Comment: (NOTE) SARS-CoV-2 target nucleic acids are NOT DETECTED.  The SARS-CoV-2 RNA is generally detectable in upper respiratory specimens  during the acute phase of infection. The  lowest concentration of SARS-CoV-2 viral copies this assay can detect is 138 copies/mL. A negative result does not preclude SARS-Cov-2 infection and should not be used as the sole basis for treatment or other patient management decisions. A negative result may occur with  improper specimen collection/handling, submission of specimen other than nasopharyngeal swab, presence of viral mutation(s) within the areas targeted by this assay, and inadequate number of viral copies(<138 copies/mL). A negative result must be combined with clinical observations, patient history, and epidemiological information. The expected result is Negative.  Fact Sheet for Patients:  EntrepreneurPulse.com.au  Fact Sheet for Healthcare Providers:  IncredibleEmployment.be  This test is no t yet approved or cleared by the [place] FDA and  has been authorized for detection and/or diagnosis of SARS-CoV-2 by FDA under an Emergency Use Authorization (EUA). This EUA will remain  in effect (meaning this test can be used) for the duration of the COVID-19 declaration under Section 564(b)(1) of the Act, 21 U.S.C.section 360bbb-3(b)(1), unless the authorization is terminated  or revoked sooner.       Influenza A by PCR NEGATIVE NEGATIVE Final   Influenza B by PCR NEGATIVE NEGATIVE Final    Comment: (NOTE) The Xpert Xpress SARS-CoV-2/FLU/RSV plus assay is intended as an aid in the diagnosis of influenza from Nasopharyngeal swab specimens and should not be used as a sole basis for treatment. Nasal washings and aspirates are unacceptable for Xpert Xpress SARS-CoV-2/FLU/RSV testing.  Fact Sheet for Patients: EntrepreneurPulse.com.au  Fact Sheet for Healthcare Providers: IncredibleEmployment.be  This test is not yet approved or cleared by the [place] FDA and has been authorized for detection and/or diagnosis of SARS-CoV-2 by FDA under  an Emergency Use Authorization (EUA). This EUA will remain in effect (meaning this test can be used) for the duration of the COVID-19 declaration under Section 564(b)(1) of the Act, 21 U.S.C. section 360bbb-3(b)(1), unless the authorization is terminated or revoked.  Performed at [facility], [place] [address] [place], [place] [address]   SARS CORONAVIRUS 2 (TAT 6-24 HRS) Nasopharyngeal Nasopharyngeal Swab     Status: None   Collection Time: 10/02/21  1:02 PM   Specimen: Nasopharyngeal Swab  Result Value Ref Range Status   SARS Coronavirus 2 NEGATIVE NEGATIVE Final    Comment: (NOTE) SARS-CoV-2 target nucleic acids are NOT DETECTED.  The SARS-CoV-2 RNA is generally detectable in upper and lower respiratory specimens during the acute phase of infection. Negative results do not preclude SARS-CoV-2 infection, do not rule out co-infections with other pathogens, and should not be used as the sole basis for treatment or other patient management decisions. Negative results must be combined with clinical observations, patient history, and epidemiological information. The expected result is Negative.  Fact Sheet for Patients: SugarRoll.be  Fact Sheet for Healthcare Providers: https://www.woods-mathews.com/  This test is not yet approved or cleared by the [place] FDA and  has been authorized for detection and/or diagnosis of SARS-CoV-2 by FDA under an Emergency Use Authorization (EUA). This EUA will remain  in effect (meaning this test can be used) for the duration of the COVID-19 declaration under Se ction 564(b)(1) of the Act, 21 U.S.C. section 360bbb-3(b)(1), unless the authorization is terminated or revoked sooner.  Performed at [facility], [place] [address] [place],  [address]      Discharge Instructions:   Discharge Instructions     Diet - low sodium heart healthy   Complete by: As  directed     Dysphagia 3 diet.   Discharge instructions   Complete by: As directed    Follow-up with your primary care physician at the skilled nursing facility in 3 to 5 days.   Increase activity slowly   Complete by: As directed       Allergies as of 10/03/2021       Reactions   Doxycycline Nausea Only   Vertigo   Macrobid [nitrofurantoin] Other (See Comments)   Nerve pain   Valsartan Other (See Comments), Hypertension   Blood pressure rise   Lisinopril Cough   Calcium Channel Blockers Palpitations   Blood pressure rise / tachycardia    Penicillins Rash   Has patient had a PCN reaction causing immediate rash, facial/tongue/throat swelling, SOB or lightheadedness with hypotension: Yes Has patient had a PCN reaction causing severe rash involving mucus membranes or skin necrosis: Yes Has patient had a PCN reaction that required hospitalization: Unk Has patient had a PCN reaction occurring within the last 10 years: No If all of the above answers are "NO", then may proceed with Cephalosporin use.        Medication List     TAKE these medications    acetaminophen 650 MG CR tablet Commonly known as: TYLENOL Take 1,300 mg by mouth 2 (two) times daily.   alendronate 70 MG tablet Commonly known as: FOSAMAX Take 70 mg by mouth once a week. Take with a full glass of water on an empty stomach.   b complex vitamins tablet Take 1 tablet by mouth daily.   bisacodyl 10 MG suppository Commonly known as: DULCOLAX Place 10 mg rectally daily as needed for moderate constipation (If not relieved by MOM).   calcitonin (salmon) 200 UNIT/ACT nasal spray Commonly known as: MIACALCIN/FORTICAL Place 1 spray into alternate nostrils daily.   carvedilol 6.25 MG tablet Commonly known as: COREG TAKE ONE TABLET BY MOUTH TWICE A DAY WITH MEALS   Entresto 49-51 MG Generic drug: sacubitril-valsartan TAKE ONE TABLET BY MOUTH TWICE A DAY What changed: how to take this   feeding supplement Liqd Take 237  mLs by mouth 2 (two) times daily between meals.   furosemide 40 MG tablet Commonly known as: LASIX TAKE ONE TABLET BY MOUTH DAILY *TAKE ONE ADDITIONAL TABLET DAILY AS NEEDED FOR SWELLING* What changed: See the new instructions.   Ginkgo Biloba 40 MG Caps TAKE 3 CAPSULES (120MG) BY MOUTH ONCE DAILY FOR SUPPLEMENT (DO NOT CRUSH) What changed: additional instructions   methocarbamol 500 MG tablet Commonly known as: ROBAXIN Take 500 mg by mouth daily as needed for muscle spasms.   multivitamin with minerals tablet Take 1 tablet by mouth daily.   NON FORMULARY ADD MEDPASS 120ML TWO TIMES A DAY   ondansetron 4 MG tablet Commonly known as: ZOFRAN Take 1 tablet (4 mg total) by mouth every 6 (six) hours as needed for nausea.   oxyCODONE 5 MG immediate release tablet Commonly known as: Oxy IR/ROXICODONE Take 1 tablet (5 mg total) by mouth every 4 (four) hours as needed for moderate pain.   polyethylene glycol 17 g packet Commonly known as: MIRALAX / GLYCOLAX Take 17 g by mouth daily. Start taking on: October 04, 2021   RA SALINE ENEMA RE Place 1 each rectally daily as needed.   Vitamin D3 25 MCG tablet Commonly known as: Vitamin D Take 1,000 Units by mouth daily. For supplement   zinc gluconate 50 MG tablet Take 1 tablet (50 mg total) by mouth daily.  Contact information for after-discharge care     Destination     [facility] Preferred SNF .   Service: Skilled Nursing Contact information: [place] [phone]                     Time coordinating discharge: 39 minutes  Signed:  Nainika Newlun  [facility] 10/03/2021, 10:40 AM

## 2021-10-07 DIAGNOSIS — I5022 Chronic systolic (congestive) heart failure: Secondary | ICD-10-CM | POA: Diagnosis not present

## 2021-10-07 DIAGNOSIS — C7951 Secondary malignant neoplasm of bone: Secondary | ICD-10-CM | POA: Diagnosis not present

## 2021-10-07 DIAGNOSIS — C50911 Malignant neoplasm of unspecified site of right female breast: Secondary | ICD-10-CM | POA: Diagnosis not present

## 2021-10-07 DIAGNOSIS — R5381 Other malaise: Secondary | ICD-10-CM | POA: Diagnosis not present

## 2021-10-17 DIAGNOSIS — C50911 Malignant neoplasm of unspecified site of right female breast: Secondary | ICD-10-CM | POA: Diagnosis not present

## 2021-10-17 DIAGNOSIS — I10 Essential (primary) hypertension: Secondary | ICD-10-CM | POA: Diagnosis not present

## 2021-10-17 DIAGNOSIS — C7951 Secondary malignant neoplasm of bone: Secondary | ICD-10-CM | POA: Diagnosis not present

## 2021-10-17 DIAGNOSIS — R5381 Other malaise: Secondary | ICD-10-CM | POA: Diagnosis not present

## 2021-10-28 DIAGNOSIS — N189 Chronic kidney disease, unspecified: Secondary | ICD-10-CM | POA: Diagnosis not present

## 2021-10-28 DIAGNOSIS — E039 Hypothyroidism, unspecified: Secondary | ICD-10-CM | POA: Diagnosis not present

## 2021-10-28 DIAGNOSIS — Z79899 Other long term (current) drug therapy: Secondary | ICD-10-CM | POA: Diagnosis not present

## 2021-10-28 DIAGNOSIS — E785 Hyperlipidemia, unspecified: Secondary | ICD-10-CM | POA: Diagnosis not present

## 2021-10-28 DIAGNOSIS — D649 Anemia, unspecified: Secondary | ICD-10-CM | POA: Diagnosis not present

## 2021-11-19 DIAGNOSIS — E039 Hypothyroidism, unspecified: Secondary | ICD-10-CM | POA: Diagnosis not present

## 2021-11-19 DIAGNOSIS — D649 Anemia, unspecified: Secondary | ICD-10-CM | POA: Diagnosis not present

## 2021-11-19 DIAGNOSIS — N189 Chronic kidney disease, unspecified: Secondary | ICD-10-CM | POA: Diagnosis not present

## 2021-11-19 DIAGNOSIS — E785 Hyperlipidemia, unspecified: Secondary | ICD-10-CM | POA: Diagnosis not present

## 2021-11-19 DIAGNOSIS — Z79899 Other long term (current) drug therapy: Secondary | ICD-10-CM | POA: Diagnosis not present

## 2021-12-02 DIAGNOSIS — C7951 Secondary malignant neoplasm of bone: Secondary | ICD-10-CM | POA: Diagnosis not present

## 2021-12-02 DIAGNOSIS — C50912 Malignant neoplasm of unspecified site of left female breast: Secondary | ICD-10-CM | POA: Diagnosis not present

## 2021-12-02 DIAGNOSIS — M150 Primary generalized (osteo)arthritis: Secondary | ICD-10-CM | POA: Diagnosis not present

## 2021-12-13 ENCOUNTER — Inpatient Hospital Stay (HOSPITAL_COMMUNITY): Payer: Medicare PPO

## 2021-12-13 ENCOUNTER — Emergency Department (HOSPITAL_COMMUNITY): Payer: Medicare PPO

## 2021-12-13 ENCOUNTER — Inpatient Hospital Stay (HOSPITAL_COMMUNITY)
Admission: EM | Admit: 2021-12-13 | Discharge: 2021-12-15 | DRG: 065 | Disposition: A | Discharge status: Hospice | Payer: Medicare PPO | Source: Skilled Nursing Facility | Attending: Internal Medicine | Admitting: Internal Medicine

## 2021-12-13 ENCOUNTER — Encounter (HOSPITAL_COMMUNITY): Payer: Self-pay | Admitting: Emergency Medicine

## 2021-12-13 ENCOUNTER — Other Ambulatory Visit: Payer: Self-pay

## 2021-12-13 DIAGNOSIS — H53462 Homonymous bilateral field defects, left side: Secondary | ICD-10-CM | POA: Diagnosis present

## 2021-12-13 DIAGNOSIS — E871 Hypo-osmolality and hyponatremia: Secondary | ICD-10-CM

## 2021-12-13 DIAGNOSIS — D638 Anemia in other chronic diseases classified elsewhere: Secondary | ICD-10-CM | POA: Diagnosis present

## 2021-12-13 DIAGNOSIS — Z789 Other specified health status: Secondary | ICD-10-CM | POA: Diagnosis not present

## 2021-12-13 DIAGNOSIS — I6349 Cerebral infarction due to embolism of other cerebral artery: Principal | ICD-10-CM | POA: Diagnosis present

## 2021-12-13 DIAGNOSIS — R2990 Unspecified symptoms and signs involving the nervous system: Principal | ICD-10-CM

## 2021-12-13 DIAGNOSIS — I63233 Cerebral infarction due to unspecified occlusion or stenosis of bilateral carotid arteries: Secondary | ICD-10-CM | POA: Diagnosis not present

## 2021-12-13 DIAGNOSIS — I5022 Chronic systolic (congestive) heart failure: Secondary | ICD-10-CM | POA: Diagnosis present

## 2021-12-13 DIAGNOSIS — Z96641 Presence of right artificial hip joint: Secondary | ICD-10-CM | POA: Diagnosis present

## 2021-12-13 DIAGNOSIS — Z90710 Acquired absence of both cervix and uterus: Secondary | ICD-10-CM

## 2021-12-13 DIAGNOSIS — I6340 Cerebral infarction due to embolism of unspecified cerebral artery: Secondary | ICD-10-CM

## 2021-12-13 DIAGNOSIS — Z801 Family history of malignant neoplasm of trachea, bronchus and lung: Secondary | ICD-10-CM

## 2021-12-13 DIAGNOSIS — I517 Cardiomegaly: Secondary | ICD-10-CM | POA: Diagnosis not present

## 2021-12-13 DIAGNOSIS — R638 Other symptoms and signs concerning food and fluid intake: Secondary | ICD-10-CM | POA: Diagnosis not present

## 2021-12-13 DIAGNOSIS — E785 Hyperlipidemia, unspecified: Secondary | ICD-10-CM | POA: Diagnosis present

## 2021-12-13 DIAGNOSIS — X58XXXA Exposure to other specified factors, initial encounter: Secondary | ICD-10-CM | POA: Diagnosis present

## 2021-12-13 DIAGNOSIS — I110 Hypertensive heart disease with heart failure: Secondary | ICD-10-CM | POA: Diagnosis present

## 2021-12-13 DIAGNOSIS — R4701 Aphasia: Secondary | ICD-10-CM | POA: Diagnosis present

## 2021-12-13 DIAGNOSIS — R9431 Abnormal electrocardiogram [ECG] [EKG]: Secondary | ICD-10-CM | POA: Diagnosis not present

## 2021-12-13 DIAGNOSIS — I6389 Other cerebral infarction: Secondary | ICD-10-CM | POA: Diagnosis not present

## 2021-12-13 DIAGNOSIS — Z7189 Other specified counseling: Secondary | ICD-10-CM | POA: Diagnosis not present

## 2021-12-13 DIAGNOSIS — C7951 Secondary malignant neoplasm of bone: Secondary | ICD-10-CM | POA: Diagnosis not present

## 2021-12-13 DIAGNOSIS — I63213 Cerebral infarction due to unspecified occlusion or stenosis of bilateral vertebral arteries: Secondary | ICD-10-CM | POA: Diagnosis not present

## 2021-12-13 DIAGNOSIS — K219 Gastro-esophageal reflux disease without esophagitis: Secondary | ICD-10-CM | POA: Diagnosis present

## 2021-12-13 DIAGNOSIS — Z823 Family history of stroke: Secondary | ICD-10-CM

## 2021-12-13 DIAGNOSIS — Z20822 Contact with and (suspected) exposure to covid-19: Secondary | ICD-10-CM | POA: Diagnosis present

## 2021-12-13 DIAGNOSIS — Z79899 Other long term (current) drug therapy: Secondary | ICD-10-CM | POA: Diagnosis not present

## 2021-12-13 DIAGNOSIS — Z96652 Presence of left artificial knee joint: Secondary | ICD-10-CM | POA: Diagnosis present

## 2021-12-13 DIAGNOSIS — C50911 Malignant neoplasm of unspecified site of right female breast: Secondary | ICD-10-CM | POA: Diagnosis present

## 2021-12-13 DIAGNOSIS — I480 Paroxysmal atrial fibrillation: Secondary | ICD-10-CM | POA: Diagnosis not present

## 2021-12-13 DIAGNOSIS — C50919 Malignant neoplasm of unspecified site of unspecified female breast: Secondary | ICD-10-CM | POA: Diagnosis not present

## 2021-12-13 DIAGNOSIS — S2242XA Multiple fractures of ribs, left side, initial encounter for closed fracture: Secondary | ICD-10-CM | POA: Diagnosis present

## 2021-12-13 DIAGNOSIS — Z833 Family history of diabetes mellitus: Secondary | ICD-10-CM

## 2021-12-13 DIAGNOSIS — I429 Cardiomyopathy, unspecified: Secondary | ICD-10-CM | POA: Diagnosis present

## 2021-12-13 DIAGNOSIS — I6300 Cerebral infarction due to thrombosis of unspecified precerebral artery: Secondary | ICD-10-CM | POA: Diagnosis not present

## 2021-12-13 DIAGNOSIS — R531 Weakness: Secondary | ICD-10-CM | POA: Diagnosis not present

## 2021-12-13 DIAGNOSIS — D72829 Elevated white blood cell count, unspecified: Secondary | ICD-10-CM

## 2021-12-13 DIAGNOSIS — Z515 Encounter for palliative care: Secondary | ICD-10-CM

## 2021-12-13 DIAGNOSIS — R29706 NIHSS score 6: Secondary | ICD-10-CM | POA: Diagnosis present

## 2021-12-13 DIAGNOSIS — R0781 Pleurodynia: Secondary | ICD-10-CM

## 2021-12-13 DIAGNOSIS — R410 Disorientation, unspecified: Secondary | ICD-10-CM | POA: Diagnosis not present

## 2021-12-13 DIAGNOSIS — R1310 Dysphagia, unspecified: Secondary | ICD-10-CM | POA: Diagnosis present

## 2021-12-13 DIAGNOSIS — R4781 Slurred speech: Secondary | ICD-10-CM | POA: Diagnosis not present

## 2021-12-13 DIAGNOSIS — Z8041 Family history of malignant neoplasm of ovary: Secondary | ICD-10-CM

## 2021-12-13 DIAGNOSIS — S2249XA Multiple fractures of ribs, unspecified side, initial encounter for closed fracture: Secondary | ICD-10-CM

## 2021-12-13 DIAGNOSIS — I639 Cerebral infarction, unspecified: Secondary | ICD-10-CM | POA: Diagnosis present

## 2021-12-13 DIAGNOSIS — E7800 Pure hypercholesterolemia, unspecified: Secondary | ICD-10-CM | POA: Diagnosis not present

## 2021-12-13 DIAGNOSIS — D649 Anemia, unspecified: Secondary | ICD-10-CM

## 2021-12-13 DIAGNOSIS — R29810 Facial weakness: Secondary | ICD-10-CM | POA: Diagnosis present

## 2021-12-13 DIAGNOSIS — Z7401 Bed confinement status: Secondary | ICD-10-CM | POA: Diagnosis not present

## 2021-12-13 DIAGNOSIS — Z66 Do not resuscitate: Secondary | ICD-10-CM | POA: Diagnosis present

## 2021-12-13 DIAGNOSIS — E8809 Other disorders of plasma-protein metabolism, not elsewhere classified: Secondary | ICD-10-CM | POA: Diagnosis present

## 2021-12-13 DIAGNOSIS — G8190 Hemiplegia, unspecified affecting unspecified side: Secondary | ICD-10-CM | POA: Diagnosis not present

## 2021-12-13 DIAGNOSIS — Z743 Need for continuous supervision: Secondary | ICD-10-CM | POA: Diagnosis not present

## 2021-12-13 DIAGNOSIS — I63532 Cerebral infarction due to unspecified occlusion or stenosis of left posterior cerebral artery: Secondary | ICD-10-CM | POA: Diagnosis not present

## 2021-12-13 LAB — CBC
HCT: 25.8 % — ABNORMAL LOW (ref 36.0–46.0)
Hemoglobin: 7.8 g/dL — ABNORMAL LOW (ref 12.0–15.0)
MCH: 26.4 pg (ref 26.0–34.0)
MCHC: 30.2 g/dL (ref 30.0–36.0)
MCV: 87.2 fL (ref 80.0–100.0)
Platelets: 344 10*3/uL (ref 150–400)
RBC: 2.96 MIL/uL — ABNORMAL LOW (ref 3.87–5.11)
RDW: 16.1 % — ABNORMAL HIGH (ref 11.5–15.5)
WBC: 12.7 10*3/uL — ABNORMAL HIGH (ref 4.0–10.5)
nRBC: 0.6 % — ABNORMAL HIGH (ref 0.0–0.2)

## 2021-12-13 LAB — DIFFERENTIAL
Abs Immature Granulocytes: 0.27 10*3/uL — ABNORMAL HIGH (ref 0.00–0.07)
Basophils Absolute: 0 10*3/uL (ref 0.0–0.1)
Basophils Relative: 0 %
Eosinophils Absolute: 0.1 10*3/uL (ref 0.0–0.5)
Eosinophils Relative: 1 %
Immature Granulocytes: 2 %
Lymphocytes Relative: 25 %
Lymphs Abs: 3.2 10*3/uL (ref 0.7–4.0)
Monocytes Absolute: 1.1 10*3/uL — ABNORMAL HIGH (ref 0.1–1.0)
Monocytes Relative: 9 %
Neutro Abs: 8 10*3/uL — ABNORMAL HIGH (ref 1.7–7.7)
Neutrophils Relative %: 63 %

## 2021-12-13 LAB — RAPID URINE DRUG SCREEN, HOSP PERFORMED
Amphetamines: NOT DETECTED
Barbiturates: NOT DETECTED
Benzodiazepines: NOT DETECTED
Cocaine: NOT DETECTED
Opiates: NOT DETECTED
Tetrahydrocannabinol: NOT DETECTED

## 2021-12-13 LAB — COMPREHENSIVE METABOLIC PANEL
ALT: 8 U/L (ref 0–44)
AST: 56 U/L — ABNORMAL HIGH (ref 15–41)
Albumin: 2.6 g/dL — ABNORMAL LOW (ref 3.5–5.0)
Alkaline Phosphatase: 134 U/L — ABNORMAL HIGH (ref 38–126)
Anion gap: 13 (ref 5–15)
BUN: 20 mg/dL (ref 8–23)
CO2: 23 mmol/L (ref 22–32)
Calcium: 9.3 mg/dL (ref 8.9–10.3)
Chloride: 96 mmol/L — ABNORMAL LOW (ref 98–111)
Creatinine, Ser: 0.83 mg/dL (ref 0.44–1.00)
GFR, Estimated: >60 mL/min (ref >60)
Glucose, Bld: 118 mg/dL — ABNORMAL HIGH (ref 70–99)
Potassium: 4.4 mmol/L (ref 3.5–5.1)
Sodium: 132 mmol/L — ABNORMAL LOW (ref 135–145)
Total Bilirubin: 1 mg/dL (ref 0.3–1.2)
Total Protein: 6.4 g/dL — ABNORMAL LOW (ref 6.5–8.1)

## 2021-12-13 LAB — URINALYSIS, ROUTINE W REFLEX MICROSCOPIC
Bilirubin Urine: NEGATIVE
Glucose, UA: NEGATIVE mg/dL
Ketones, ur: NEGATIVE mg/dL
Nitrite: NEGATIVE
Protein, ur: NEGATIVE mg/dL
Specific Gravity, Urine: 1.021 (ref 1.005–1.030)
pH: 6 (ref 5.0–8.0)

## 2021-12-13 LAB — PROTIME-INR
INR: 1.1 (ref 0.8–1.2)
Prothrombin Time: 14 seconds (ref 11.4–15.2)

## 2021-12-13 LAB — RESP PANEL BY RT-PCR (FLU A&B, COVID) ARPGX2
Influenza A by PCR: NEGATIVE
Influenza B by PCR: NEGATIVE
SARS Coronavirus 2 by RT PCR: NEGATIVE

## 2021-12-13 LAB — ETHANOL: Alcohol, Ethyl (B): <10 mg/dL (ref <10)

## 2021-12-13 MED ORDER — METHYLPREDNISOLONE SODIUM SUCC 125 MG IJ SOLR: 125.0000 mg | INTRAMUSCULAR | Status: DC

## 2021-12-13 MED ORDER — IOHEXOL 350 MG/ML SOLN
75.0000 mL | Freq: Once | INTRAVENOUS | Status: AC | PRN
  Administered 2021-12-13: 75 mL via INTRAVENOUS

## 2021-12-13 MED ORDER — STROKE: EARLY STAGES OF RECOVERY BOOK: Freq: Once | Status: DC

## 2021-12-13 MED ORDER — HEPARIN SODIUM (PORCINE) 5000 UNIT/ML IJ SOLN
5000.0000 [IU] | Freq: Three times a day (TID) | INTRAMUSCULAR | Status: DC
  Administered 2021-12-13 – 2021-12-14 (×3): 5000 [IU] via SUBCUTANEOUS
  Filled 2021-12-13 (×3): qty 1

## 2021-12-13 MED ORDER — SODIUM CHLORIDE 0.9 % IV SOLN: INTRAVENOUS | Status: DC

## 2021-12-13 MED ORDER — DIPHENHYDRAMINE HCL 50 MG/ML IJ SOLN: 50.0000 mg | INTRAMUSCULAR | Status: DC

## 2021-12-13 MED ORDER — ASPIRIN 300 MG RE SUPP
300.0000 mg | Freq: Every day | RECTAL | Status: DC
  Administered 2021-12-13 – 2021-12-14 (×2): 300 mg via RECTAL
  Filled 2021-12-13 (×2): qty 1

## 2021-12-13 MED ORDER — FAMOTIDINE IN NACL 20-0.9 MG/50ML-% IV SOLN: 20.0000 mg | INTRAVENOUS | Status: DC

## 2021-12-13 MED ORDER — LABETALOL HCL 5 MG/ML IV SOLN: 10.0000 mg | INTRAVENOUS | Status: DC | PRN

## 2021-12-13 MED ORDER — DIGOXIN 0.25 MG/ML IJ SOLN
0.1250 mg | Freq: Four times a day (QID) | INTRAMUSCULAR | Status: DC
  Administered 2021-12-13: 0.125 mg via INTRAVENOUS
  Filled 2021-12-13: qty 2
  Filled 2021-12-13: qty 0.5

## 2021-12-13 MED ORDER — GADOBUTROL 1 MMOL/ML IV SOLN
5.0000 mL | Freq: Once | INTRAVENOUS | Status: AC | PRN
  Administered 2021-12-13: 5 mL via INTRAVENOUS

## 2021-12-13 MED ORDER — ASPIRIN 325 MG PO TABS: 325.0000 mg | ORAL_TABLET | Freq: Every day | ORAL | Status: DC

## 2021-12-13 MED ORDER — ACETAMINOPHEN 650 MG RE SUPP
650.0000 mg | RECTAL | Status: DC | PRN
  Administered 2021-12-14: 650 mg via RECTAL
  Filled 2021-12-13: qty 1

## 2021-12-13 MED ORDER — METOPROLOL TARTRATE 5 MG/5ML IV SOLN: 5.0000 mg | Freq: Four times a day (QID) | INTRAVENOUS | Status: DC | PRN

## 2021-12-13 MED ORDER — ACETAMINOPHEN 325 MG PO TABS
650.0000 mg | ORAL_TABLET | ORAL | Status: DC | PRN
Start: 2021-12-13 — End: 2021-12-16
  Administered 2021-12-14 (×2): 650 mg via ORAL
  Filled 2021-12-13 (×2): qty 2

## 2021-12-13 MED ORDER — ACETAMINOPHEN 160 MG/5ML PO SOLN: 650.0000 mg | ORAL | Status: DC | PRN

## 2021-12-13 NOTE — ED Notes (Signed)
I called 3w  there is a bed in the pts room now ?

## 2021-12-13 NOTE — H&P (Signed)
History and Physical    Barbara Kelley FWY:[id number] DOB: 1934/08/21 DOA: 12/13/2021  PCP: [facility] [place] (Confirm with patient/family/NH records and if not entered, this has to be entered at [facility] point of entry) Patient coming from: Assisted living  I have personally briefly reviewed patient's old medical records in [facility]  Chief Complaint: Feeling weak and slowed speech  HPI: Barbara Kelley is a 88 y.o. female with medical history significant of chronic HFrEF, metastatic breast cancer, hypercalcemia, stroke, chronic microcytic anemia, came with general weakness and speech problem.  Symptoms started after lunch today, when family noticed patient "the speed of her talking is really slow and not her" told family that she has bilateral arm weakness.  December 2022, patient was hospitalized for general weakness during which time it was found patient has hypercalcemia and metastatic breast cancer.  Patient was stabilized and both patient and her family agreed with palliative/hospice care.  Patient was discharged to rehab with palliative care following however, since patient discharged from rehab to assisted living, no hospice team following.  ED Course: Patient was found to have paroxysmal A-fib on the monitor. BP stable.  Brain MRI small acute infarct scattered throughout both cerebral hemisphere and left cerebellum, concerning for central emboli.  CTA pending.  Blood work hemoglobin 7.8, WBC 12.7.  Calcium 9.3 (corrected level 10.0).  Review of Systems: As per HPI otherwise 14 point review of systems negative.    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 ; deficit of CVA in 2008   Pneumonia    x2  most recent fall of 2017    PONV (postoperative nausea and vomiting)    Stroke ([place]) 2008   deficit of neuroplathy in left hand and left foot     Past Surgical History:  Procedure Laterality Date   ABDOMINAL HYSTERECTOMY     APPENDECTOMY     BLADDER SUSPENSION     CARDIAC CATHETERIZATION  10/2015   CHOLECYSTECTOMY     HEMIARTHROPLASTY HIP Right 10/20/2017   HIP ARTHROPLASTY Right 10/19/2017   Procedure: ARTHROPLASTY BIPOLAR HIP (HEMIARTHROPLASTY);  Surgeon: Paralee Cancel, MD;  Location: [facility];  Service: Orthopedics;  Laterality: Right;   JOINT REPLACEMENT Left 2013   left hip   TONSILLECTOMY     TOTAL KNEE ARTHROPLASTY Left 05/04/2017   Procedure: LEFT TOTAL KNEE ARTHROPLASTY;  Surgeon: Paralee Cancel, MD;  Location: [facility];  Service: Orthopedics;  Laterality: Left;  70 mins   TUBAL LIGATION       reports that she has never smoked. She has never used smokeless tobacco. She reports that she does not drink alcohol and does not use drugs.  Allergies  Allergen Reactions   Doxycycline Nausea Only    Vertigo   Macrobid [Nitrofurantoin] Other (See Comments)    Nerve pain   Valsartan Other (See Comments) and Hypertension    Blood pressure rise   Lisinopril Cough   Calcium Channel Blockers Palpitations    Blood pressure rise / tachycardia    Penicillins Rash    Has patient had a PCN reaction causing immediate rash, facial/tongue/throat swelling, SOB or lightheadedness with hypotension:  Yes Has patient had a PCN reaction causing severe rash involving mucus membranes or skin necrosis: Yes Has patient had a PCN reaction that required hospitalization: Unk Has patient had a PCN reaction occurring within the last 10 years: No If all of the above answers are "NO", then may proceed with Cephalosporin use.     Family History  Problem Relation Age of Onset   Diabetes Mother    Ovarian cancer Mother    Lung cancer Father    Diabetes Sister    Stroke Maternal Grandfather      Prior to  Admission medications   Medication Sig Start Date End Date Taking? Authorizing Provider  acetaminophen (TYLENOL) 650 MG CR tablet Take 1,300 mg by mouth 2 (two) times daily.    [provider]  alendronate (FOSAMAX) 70 MG tablet Take 70 mg by mouth once a week. Take with a full glass of water on an empty stomach. Patient not taking: Reported on 09/26/2021    [provider]  b complex vitamins tablet Take 1 tablet by mouth daily.    [provider]  bisacodyl (DULCOLAX) 10 MG suppository Place 10 mg rectally daily as needed for moderate constipation (If not relieved by MOM).    [provider]  calcitonin, salmon, (MIACALCIN/FORTICAL) 200 UNIT/ACT nasal spray Place 1 spray into alternate nostrils daily. Patient not taking: Reported on 09/26/2021    [provider]  carvedilol (COREG) 6.25 MG tablet TAKE ONE TABLET BY MOUTH TWICE A DAY WITH MEALS Patient taking differently: Take 6.25 mg by mouth 2 (two) times daily with a meal. 05/05/21   Crenshaw, [name], MD  ENTRESTO 49-51 MG TAKE ONE TABLET BY MOUTH TWICE A DAY Patient taking differently: 1 tablet 2 (two) times daily. 02/04/21   Lelon Perla, MD  feeding supplement (ENSURE ENLIVE / ENSURE PLUS) LIQD Take 237 mLs by mouth 2 (two) times daily between meals. 10/03/21   Pokhrel, Corrie Mckusick, MD  furosemide (LASIX) 40 MG tablet TAKE ONE TABLET BY MOUTH DAILY *TAKE ONE ADDITIONAL TABLET DAILY AS NEEDED FOR SWELLING* Patient taking differently: Take 40 mg by mouth daily as needed (for swelling). 08/04/21   Lelon Perla, MD  Ginkgo Biloba 40 MG CAPS TAKE 3 CAPSULES ('120MG'$ ) BY MOUTH ONCE DAILY FOR SUPPLEMENT (DO NOT CRUSH) Patient taking differently: Take 2 capsules ('120mg'$ ) by mouth once daily for supplement (Do Not Crush) 11/01/19   Medina-Vargas, Monina C, NP  methocarbamol (ROBAXIN) 500 MG tablet Take 500 mg by mouth daily as needed for muscle spasms.    [provider]  Multiple Vitamins-Minerals  (MULTIVITAMIN WITH MINERALS) tablet Take 1 tablet by mouth daily.    [provider]  NON FORMULARY ADD MEDPASS 120ML TWO TIMES A DAY    [provider]  ondansetron (ZOFRAN) 4 MG tablet Take 1 tablet (4 mg total) by mouth every 6 (six) hours as needed for nausea. 10/03/21   Pokhrel, Corrie Mckusick, MD  oxyCODONE (OXY IR/ROXICODONE) 5 MG immediate release tablet Take 1 tablet (5 mg total) by mouth every 4 (four) hours as needed for moderate pain. 10/03/21 09/24/22  Pokhrel, Corrie Mckusick, MD  polyethylene glycol (MIRALAX / GLYCOLAX) 17 g packet Take 17 g by mouth daily. 10/04/21   Pokhrel, Corrie Mckusick, MD  Sodium Phosphates (RA SALINE ENEMA RE) Place 1 each rectally daily as needed.    [provider]  Vitamin D3 (VITAMIN D) 25 MCG tablet Take 1,000 Units by mouth daily. For supplement    [provider]  zinc gluconate 50 MG tablet Take 1 tablet (50 mg total) by mouth daily. 11/01/19   Medina-VargasJaymes Graff C, NP    Physical Exam: Vitals:   12/13/21 1532 12/13/21 1700 12/13/21 1715 12/13/21 1723  BP: (!) 169/101 (!) 143/73 (!) 145/77   Pulse: 92 (!) 107 (!) 106   Resp: (!) 24 (!) 28 (!) 27   Temp:    99.8 F (37.7 C)  TempSrc:      SpO2: 94% 91% 91%     Constitutional: NAD, calm, comfortable Vitals:   12/13/21 1532 12/13/21 1700 12/13/21 1715 12/13/21 1723  BP: (!) 169/101 (!) 143/73 (!) 145/77   Pulse: 92 (!) 107 (!) 106   Resp: (!) 24 (!) 28 (!) 27   Temp:    99.8 F (37.7 C)  TempSrc:      SpO2: 94% 91% 91%    Eyes: PERRL, lids and conjunctivae normal ENMT: Mucous membranes are moist. Posterior pharynx clear of any exudate or lesions.Normal dentition.  Neck: normal, supple, no masses, no thyromegaly Respiratory: clear to auscultation bilaterally, no wheezing, no crackles. Normal respiratory effort. No accessory muscle use.  Cardiovascular: Regular rate and rhythm, no murmurs / rubs / gallops. No extremity edema. 2+ pedal pulses. No carotid bruits.  Abdomen: no  tenderness, no masses palpated. No hepatosplenomegaly. Bowel sounds positive.  Musculoskeletal: no clubbing / cyanosis. No joint deformity upper and lower extremities. Good ROM, no contractures. Normal muscle tone.  Skin: no rashes, lesions, ulcers. No induration Neurologic: CN 2-12 grossly intact. Sensation intact, DTR normal. Strength 4/5 in all 4.  Psychiatric: Normal judgment and insight. Alert and oriented x 3. Normal mood.   (  Labs on Admission: I have personally reviewed following labs and imaging studies  CBC: Recent Labs  Lab 12/13/21 1256  WBC 12.7*  NEUTROABS 8.0*  HGB 7.8*  HCT 25.8*  MCV 87.2  PLT 811   Basic Metabolic Panel: Recent Labs  Lab 12/13/21 1256  NA 132*  K 4.4  CL 96*  CO2 23  GLUCOSE 118*  BUN 20  CREATININE 0.83  CALCIUM 9.3   GFR: CrCl cannot be calculated (Unknown ideal weight.). Liver Function Tests: Recent Labs  Lab 12/13/21 1256  AST 56*  ALT 8  ALKPHOS 134*  BILITOT 1.0  PROT 6.4*  ALBUMIN 2.6*   No results for input(s): LIPASE, AMYLASE in the last 168 hours. No results for input(s): AMMONIA in the last 168 hours. Coagulation Profile: Recent Labs  Lab 12/13/21 1256  INR 1.1   Cardiac Enzymes: No results for input(s): CKTOTAL, CKMB, CKMBINDEX, TROPONINI in the last 168 hours. BNP (last 3 results) No results for input(s): PROBNP in the last 8760 hours. HbA1C: No results for input(s): HGBA1C in the last 72 hours. CBG: No results for input(s): GLUCAP in the last 168 hours. Lipid Profile: No results for input(s): CHOL, HDL, LDLCALC, TRIG, CHOLHDL, LDLDIRECT in the last 72 hours. Thyroid Function Tests: No results for input(s): TSH, T4TOTAL, FREET4, T3FREE, THYROIDAB in the last 72 hours. Anemia Panel: No results for input(s): VITAMINB12, FOLATE, FERRITIN, TIBC, IRON, RETICCTPCT in the last 72 hours. Urine analysis:    Component Value Date/Time   COLORURINE YELLOW 09/26/2021 1909   APPEARANCEUR CLEAR 09/26/2021 1909    LABSPEC 1.020 09/26/2021 1909   PHURINE 6.0 09/26/2021 1909   GLUCOSEU NEGATIVE 09/26/2021 1909   HGBUR MODERATE (A) 09/26/2021 1909   BILIRUBINUR NEGATIVE 09/26/2021 1909   KETONESUR 15 (A) 09/26/2021 [place]  TRACE (A) 09/26/2021 1909   NITRITE NEGATIVE 09/26/2021 1909   LEUKOCYTESUR LARGE (A) 09/26/2021 1909    Radiological Exams on Admission: MR Brain W and Wo Contrast  Result Date: 12/13/2021 CLINICAL DATA:  Neuro deficit, acute, stroke suspected. Visual field deficit, hx of cancer, concern for metastasis or stroke. EXAM: MRI HEAD WITHOUT AND WITH CONTRAST TECHNIQUE: Multiplanar, multiecho pulse sequences of the brain and surrounding structures were obtained without and with intravenous contrast. CONTRAST:  59m GADAVIST GADOBUTROL 1 MMOL/ML IV SOLN COMPARISON:  Head CT 09/26/2021 FINDINGS: The study is moderately motion degraded. Brain: There are small, acute, predominantly cortically based infarcts scattered throughout both cerebral hemispheres involving bilateral MCA, bilateral PCA, and right ACA territories. There are also small acute superior left cerebellar infarcts. A few scattered chronic cerebral microhemorrhages are noted. There is a chronic infarct anteriorly in the left basal ganglia with associated chronic blood products and ex vacuo dilatation of the frontal horn of the left lateral ventricle. Patchy to confluent T2 hyperintensities in the cerebral white matter bilaterally are nonspecific but compatible with moderate to severe chronic small vessel ischemic disease. Milder chronic small vessel changes are present in the pons. Small chronic infarcts are present in the thalami and cerebellum. Numerous small T2 hyperintensities throughout the basal ganglia bilaterally are compatible with dilated perivascular spaces, possibly with some interspersed chronic lacunar infarcts. There is mild-to-moderate cerebral atrophy. No abnormal enhancement is identified. Vascular: Major  intracranial vascular flow voids are preserved. Skull and upper cervical spine: Unremarkable bone marrow signal para Sinuses/Orbits: Small mucous retention cyst in the left maxillary sinus. Clear mastoid air cells. Other: None. IMPRESSION: 1. Motion degraded examination. 2. Small acute infarcts scattered throughout both cerebral hemispheres and left cerebellum, query central emboli. 3. Moderate to severe chronic small vessel ischemic disease. Electronically Signed   By: ALogan BoresM.D.   On: 12/13/2021 15:22    EKG: Independently reviewed. Sinus, frequent PVC  Assessment/Plan Active Problems:   * No active hospital problems. *  (please populate well all problems here in Problem List. (For example, if patient is on BP meds at home and you resume or decide to hold them, it is a problem that needs to be her. Same for CAD, COPD, HLD and so on)  Embolic stroke -With new onset of dysarthria and bilateral paresis, and dysphagia as per bedside nurse evaluation -Secondary to new onset of PAF on telemetry monitoring.  Continue telemetry monitoring x24 hours.  Patient has chronic anemia, family not where patient has had any history of anemia or GI bleed.  Given the patient's overall condition with advanced metastatic breast cancer and severe anemia, makes her poor candidate for systemic anticoagulation. -Echo, CTA head and neck pending. -Continue aspirin for now, as needed labetalol to allow permissive hypertension.  But will need to watch volume status closely as patient has history of HFrEF.  PAF, rate not controled -Given Hx of HFrEF, will use Digoxin loading and PRN metoprolol for rate control. Digoxin 0.'125mg'$  Q6Hx2 given her age, will not give full loading, re-evaluate in AM for more rate control strategy.  Chronic HFrEF -Compensated. -Hold PO CHF meds due to dysphagia -PRN Lasix and BP meds as above.  Chronic normocytic anemia -According to family, no history of GI bleed, will check iron  study  Hypoalbuminemia -Unknown etiology, probably secondary to poor baseline nutrition  Metastatic breast cancer -Diagnosed December 2022, patient told to go palliative care.  Image study showed bone metastasis has been progressed. Consult palliative care  today.  Leukocytosis -Appears to be chronic likely related to metastatic disease.  UA pending, no fever no cough, will monitor off antibiotics for now.   DVT prophylaxis: Lovenox Code Status: DNR Family Communication: Daughter at bedside Disposition Plan: Given the significant function compromise, expect more than 2 midnight hospital stay, expect patient discharged to rehab. Consults called: Neurology, palliative care Admission status: Telemetry admission   Lequita Halt MD [facility] Pager [phone]  12/13/2021, 5:24 PM

## 2021-12-13 NOTE — ED Triage Notes (Signed)
Patient BIB [facility] from [place], facility reports facial droop and slurred speech yesterday at 1300. Waited to see if it resolved on own. VSS. ?

## 2021-12-13 NOTE — ED Notes (Signed)
Report given to brenda [phone]  there has to be a bed replaced they will call when replaced ?

## 2021-12-13 NOTE — ED Notes (Signed)
Pt asking for mher husband  ?? deceased ?

## 2021-12-13 NOTE — Consult Note (Addendum)
Neurology Consultation  Reason for Consult: strokes on MRI  Referring Physician: Dr. Melina Copa   CC: facial droop, aphasia and left side weakness   History is obtained from:patient, daughter and medical record   HPI: Barbara Kelley is a 88 y.o. 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 Patient tells me that she believes she noticed these symptoms prior to going to bed last pm, however medical chart states LKW after lunch today.  MRI brain with multiple scattered acute infarcts. Neurology consulted   Daughter at bedside to provide some history but patient lives at the assisted living facility and daughter visits often but could not determine a good last known well.   LKW: unknown tpa given?: no, outside window  Premorbid modified Rankin scale (mRS):  4-Needs assistance to walk and tend to bodily needs  ROS: Full ROS was performed and is negative except as noted in the HPI.  Past Medical History:  Diagnosis Date   Arthritis    Cardiomyopathy ([facility])    Cataract    NS OU   Congestive heart failure (CHF) ([facility])    Dyspnea    GERD (gastroesophageal reflux disease)    currently non problematic    Hypertension    Hypertensive retinopathy    OD   Neuropathy    related to stroke ; deficit of CVA in 2008   Pneumonia    x2 most recent fall of 2017    PONV (postoperative nausea and vomiting)    Stroke ([facility]) 2008   deficit of neuroplathy in left hand and left foot      Essential (primary) hypertension and Heart failure, unspecified   Family History  Problem Relation Age of Onset   Diabetes Mother    Ovarian cancer Mother    Lung cancer Father    Diabetes Sister    Stroke Maternal Grandfather      Social History:   reports that she has never smoked. She has never used smokeless tobacco. She reports  that she does not drink alcohol and does not use drugs.  Medications  Current Facility-Administered Medications:    iohexol (OMNIPAQUE) 350 MG/ML injection 75 mL, 75 mL, Intravenous, Once PRN, Amie Portland, MD  Current Outpatient Medications:    acetaminophen (TYLENOL) 650 MG CR tablet, Take 1,300 mg by mouth 2 (two) times daily., Disp: , Rfl:    alendronate (FOSAMAX) 70 MG tablet, Take 70 mg by mouth once a week. Take with a full glass of water on an empty stomach. (Patient not taking: Reported on 09/26/2021), Disp: , Rfl:    b complex vitamins tablet, Take 1 tablet by mouth daily., Disp: , Rfl:    bisacodyl (DULCOLAX) 10 MG suppository, Place 10 mg rectally daily as needed for moderate constipation (If not relieved by MOM)., Disp: , Rfl:    calcitonin, salmon, (MIACALCIN/FORTICAL) 200 UNIT/ACT nasal spray, Place 1 spray into alternate nostrils daily. (Patient not taking: Reported on 09/26/2021), Disp: , Rfl:    carvedilol (COREG) 6.25 MG tablet, TAKE ONE TABLET BY MOUTH TWICE A DAY WITH MEALS (Patient taking differently: Take 6.25 mg by mouth 2 (two) times daily with a meal.), Disp: 180 tablet, Rfl: 1   ENTRESTO 49-51 MG, TAKE ONE TABLET BY MOUTH TWICE A DAY (Patient taking differently: 1 tablet 2 (two) times daily.), Disp: 180 tablet, Rfl: 3  feeding supplement (ENSURE ENLIVE / ENSURE PLUS) LIQD, Take 237 mLs by mouth 2 (two) times daily between meals., Disp: , Rfl:    furosemide (LASIX) 40 MG tablet, TAKE ONE TABLET BY MOUTH DAILY *TAKE ONE ADDITIONAL TABLET DAILY AS NEEDED FOR SWELLING* (Patient taking differently: Take 40 mg by mouth daily as needed (for swelling).), Disp: 60 tablet, Rfl: 2   Ginkgo Biloba 40 MG CAPS, TAKE 3 CAPSULES ('120MG'$ ) BY MOUTH ONCE DAILY FOR SUPPLEMENT (DO NOT CRUSH) (Patient taking differently: Take 2 capsules ('120mg'$ ) by mouth once daily for supplement (Do Not Crush)), Disp: 90 capsule, Rfl: 0   methocarbamol (ROBAXIN) 500 MG tablet, Take 500 mg by mouth daily as  needed for muscle spasms., Disp: , Rfl:    Multiple Vitamins-Minerals (MULTIVITAMIN WITH MINERALS) tablet, Take 1 tablet by mouth daily., Disp: , Rfl:    NON FORMULARY, ADD MEDPASS 120ML TWO TIMES A DAY, Disp: , Rfl:    ondansetron (ZOFRAN) 4 MG tablet, Take 1 tablet (4 mg total) by mouth every 6 (six) hours as needed for nausea., Disp: 20 tablet, Rfl: 0   oxyCODONE (OXY IR/ROXICODONE) 5 MG immediate release tablet, Take 1 tablet (5 mg total) by mouth every 4 (four) hours as needed for moderate pain., Disp: 10 tablet, Rfl: 0   polyethylene glycol (MIRALAX / GLYCOLAX) 17 g packet, Take 17 g by mouth daily., Disp: , Rfl: 0   Sodium Phosphates (RA SALINE ENEMA RE), Place 1 each rectally daily as needed., Disp: , Rfl:    Vitamin D3 (VITAMIN D) 25 MCG tablet, Take 1,000 Units by mouth daily. For supplement, Disp: , Rfl:    zinc gluconate 50 MG tablet, Take 1 tablet (50 mg total) by mouth daily., Disp: 30 tablet, Rfl: 0   Exam: Current vital signs: BP (!) 169/101 (BP Location: Left Arm)    Pulse 92    Temp 98.3 F (36.8 C) (Oral)    Resp (!) 24    SpO2 94%  Vital signs in last 24 hours: Temp:  [98.3 F (36.8 C)] 98.3 F (36.8 C) (03/11 1240) Pulse Rate:  [80-92] 92 (03/11 1532) Resp:  [22-24] 24 (03/11 1532) BP: (129-169)/(66-101) 169/101 (03/11 1532) SpO2:  [93 %-98 %] 94 % (03/11 1532)  GENERAL: Awake, alert in NAD HEENT: - Normocephalic and atraumatic, dry mm LUNGS - Clear to auscultation bilaterally with no wheezes CV - S1S2 RRR, no m/r/g, equal pulses bilaterally. ABDOMEN - Soft, nontender, nondistended with normoactive BS Ext: warm, well perfused, intact peripheral pulses, no edema  NEURO:  Mental Status: AA&Ox3  Language: speech is slurred.  Naming, repetition, fluency, and comprehension intact. Cranial Nerves: PERRL 3 mm/brisk. EOMI, visual fields with left field cut, left facial asymmetry, facial sensation intact, hearing intact, tongue/uvula/soft palate midline, normal  sternocleidomastoid and trapezius muscle strength. No evidence of tongue atrophy or fibrillations Motor: left upper 4/5, left lower 4/5, right upper 5/5, right lower 5/5 Tone: is normal and bulk is normal Sensation- Intact to light touch bilaterally Coordination: FTN mild ataxia on left upper  Gait- deferred  NIHSS 1a Level of Conscious.: 0 1b LOC Questions: 0 1c LOC Commands: 0 2 Best Gaze: 0 3 Visual: 1 4 Facial Palsy: 1 5a Motor Arm - left: 1 5b Motor Arm - Right: 0 6a Motor Leg - Left: 1 6b Motor Leg - Right: 0 7 Limb Ataxia: 1 8 Sensory: 0 9 Best Language: 0 10 Dysarthria: 1 11 Extinct. and Inatten.: 0 TOTAL: 6    Labs I  have reviewed labs in epic and the results pertinent to this consultation are:  CBC    Component Value Date/Time   WBC 12.7 (H) 12/13/2021 1256   RBC 2.96 (L) 12/13/2021 1256   HGB 7.8 (L) 12/13/2021 1256   HCT 25.8 (L) 12/13/2021 1256   PLT 344 12/13/2021 1256   MCV 87.2 12/13/2021 1256   MCH 26.4 12/13/2021 1256   MCHC 30.2 12/13/2021 1256   RDW 16.1 (H) 12/13/2021 1256   LYMPHSABS 3.2 12/13/2021 1256   MONOABS 1.1 (H) 12/13/2021 1256   EOSABS 0.1 12/13/2021 1256   BASOSABS 0.0 12/13/2021 1256    CMP     Component Value Date/Time   NA 132 (L) 12/13/2021 1256   NA 143 12/24/2020 1000   K 4.4 12/13/2021 1256   CL 96 (L) 12/13/2021 1256   CO2 23 12/13/2021 1256   GLUCOSE 118 (H) 12/13/2021 1256   BUN 20 12/13/2021 1256   BUN 29 (H) 12/24/2020 1000   CREATININE 0.83 12/13/2021 1256   CALCIUM 9.3 12/13/2021 1256   PROT 6.4 (L) 12/13/2021 1256   ALBUMIN 2.6 (L) 12/13/2021 1256   AST 56 (H) 12/13/2021 1256   ALT 8 12/13/2021 1256   ALKPHOS 134 (H) 12/13/2021 1256   BILITOT 1.0 12/13/2021 1256   GFRNONAA >60 12/13/2021 1256   GFRAA 61 07/16/2020 0957    Lipid Panel     Component Value Date/Time   CHOL 223 (H) 06/06/2020 0954   TRIG 132 06/06/2020 0954   HDL 49 06/06/2020 0954   CHOLHDL 4.6 (H) 06/06/2020 0954   LDLCALC 150  (H) 06/06/2020 0954     Imaging I have reviewed the images obtained:  MRI examination of the brain 3/11:  1. Motion degraded examination. 2. Small acute infarcts scattered throughout both cerebral hemispheres and left cerebellum, query central emboli. 3. Moderate to severe chronic small vessel ischemic disease  CTA H/N 3/11: Pending  Assessment:  Barbara Kelley is a 88 y.o. 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 Patient tells me that she believes she noticed these symptoms prior to going to bed last pm, however medical chart states LKW after lunch today.  Multiple scattered acute ischemic infarcts likely due to hypercoagulability due to  metastatic breast cancer vs cardioembolic source   Recommendations: - HgbA1c, fasting lipid panel - Frequent neuro checks - Echocardiogram - CTA head and neck- read pending  - EKG  - bedside swallow screen  - Prophylactic therapy-Antiplatelet med: Aspirin - dose '81mg'$   - if suspect this is from hypercoagulable due to cancer, consider starting full dose lovenox treatment  - Risk factor modification - Telemetry monitoring - PT consult, OT consult, Speech consult - Stroke team to follow   Beulah Gandy DNP, [place]  Attending Neurohospitalist Addendum Patient seen and examined with APP/Resident. Agree with the history and physical as documented above. Agree with the plan as documented, which I helped formulate. I have independently reviewed the chart, obtained history, review of systems and examined the patient.I have personally reviewed pertinent head/neck/spine imaging (CT/MRI). Please feel free to call with any questions.  -- Amie Portland, MD Neurologist [facility] Pager: [phone]

## 2021-12-13 NOTE — ED Notes (Signed)
Neuro has seen already ?

## 2021-12-13 NOTE — ED Notes (Signed)
The pt was unable to drink the water without stopping for breath and she did not finish the water ?

## 2021-12-13 NOTE — ED Provider Notes (Signed)
Signout from Dr. Roslynn Amble.  88 year old female with history of CAD, possible metastatic lesions.  Here with facial droop aphasia weakness on left.  Plan is to follow-up on results of MRI.  Neurology Dr. Rory Percy aware of patient ?Physical Exam  ?BP (!) 169/101 (BP Location: Left Arm)   Pulse 92   Temp 98.3 ?F (36.8 ?C) (Oral)   Resp (!) 24   SpO2 94%  ? ?Physical Exam ? ?Procedures  ?Procedures ? ?ED Course / MDM  ?  ?Medical Decision Making ?Amount and/or Complexity of Data Reviewed ?Labs: ordered. ?Radiology: ordered. ? ?Risk ?Prescription drug management. ?Decision regarding hospitalization. ? ? ?MRI showing small acute infarcts question emboli.  Discussed with neurology Dr. Rory Percy.  ? ?Discussed with Dr. Roosevelt Locks [facility] who will evaluate patient for admission. ? ? ? ? ?  ?Hayden Rasmussen, MD ?12/14/21 [phone] ? ?

## 2021-12-13 NOTE — ED Notes (Signed)
Admitting doctor at the bedside 

## 2021-12-13 NOTE — ED Provider Notes (Signed)
[facility] EMERGENCY DEPARTMENT Provider Note   CSN: [id number] Arrival date & time: 12/13/21  1236     History  Chief Complaint  Patient presents with   Facial Droop   Aphasia    Barbara Kelley is a 88 y.o. female.  Presenting to ER with concern for facial droop, aphasia, weakness.  History obtained from patient, EMS report, RN discussion with facility CNA.  Facility reported that when they left for lunch she was normal but when they checked on her in the afternoon she was noted to have somewhat of slurred speech, left facial droop and left-sided weakness.  Did not seek medical care at the time.  Patient is unsure exactly when her symptoms started.  States that she does not feel confused, feels slightly weak/tingly on the left side of her body.  She denies any speech changes or vision changes.  Recent hospitalization on 12/30 for fall.  Discharged to SNF.  Generalized weakness, metastatic bony lesions.  Family wishing to proceed with palliative care services on discharge.  DNR.  HPI     Home Medications Prior to Admission medications   Medication Sig Start Date End Date Taking? Authorizing Provider  acetaminophen (TYLENOL) 650 MG CR tablet Take 1,300 mg by mouth 2 (two) times daily.    [provider]  alendronate (FOSAMAX) 70 MG tablet Take 70 mg by mouth once a week. Take with a full glass of water on an empty stomach. Patient not taking: Reported on 09/26/2021    [provider]  b complex vitamins tablet Take 1 tablet by mouth daily.    [provider]  bisacodyl (DULCOLAX) 10 MG suppository Place 10 mg rectally daily as needed for moderate constipation (If not relieved by MOM).    [provider]  calcitonin, salmon, (MIACALCIN/FORTICAL) 200 UNIT/ACT nasal spray Place 1 spray into alternate nostrils daily. Patient not taking: Reported on 09/26/2021    [provider]  carvedilol (COREG) 6.25 MG tablet TAKE  ONE TABLET BY MOUTH TWICE A DAY WITH MEALS Patient taking differently: Take 6.25 mg by mouth 2 (two) times daily with a meal. 05/05/21   Crenshaw, [name], MD  ENTRESTO 49-51 MG TAKE ONE TABLET BY MOUTH TWICE A DAY Patient taking differently: 1 tablet 2 (two) times daily. 02/04/21   Lelon Perla, MD  feeding supplement (ENSURE ENLIVE / ENSURE PLUS) LIQD Take 237 mLs by mouth 2 (two) times daily between meals. 10/03/21   Pokhrel, Corrie Mckusick, MD  furosemide (LASIX) 40 MG tablet TAKE ONE TABLET BY MOUTH DAILY *TAKE ONE ADDITIONAL TABLET DAILY AS NEEDED FOR SWELLING* Patient taking differently: Take 40 mg by mouth daily as needed (for swelling). 08/04/21   Lelon Perla, MD  Ginkgo Biloba 40 MG CAPS TAKE 3 CAPSULES ('120MG'$ ) BY MOUTH ONCE DAILY FOR SUPPLEMENT (DO NOT CRUSH) Patient taking differently: Take 2 capsules ('120mg'$ ) by mouth once daily for supplement (Do Not Crush) 11/01/19   Medina-Vargas, Monina C, NP  methocarbamol (ROBAXIN) 500 MG tablet Take 500 mg by mouth daily as needed for muscle spasms.    [provider]  Multiple Vitamins-Minerals (MULTIVITAMIN WITH MINERALS) tablet Take 1 tablet by mouth daily.    [provider]  NON FORMULARY ADD MEDPASS 120ML TWO TIMES A DAY    [provider]  ondansetron (ZOFRAN) 4 MG tablet Take 1 tablet (4 mg total) by mouth every 6 (six) hours as needed for nausea. 10/03/21   Flora Lipps, MD  oxyCODONE (  OXY IR/ROXICODONE) 5 MG immediate release tablet Take 1 tablet (5 mg total) by mouth every 4 (four) hours as needed for moderate pain. 10/03/21 09/24/22  Pokhrel, Corrie Mckusick, MD  polyethylene glycol (MIRALAX / GLYCOLAX) 17 g packet Take 17 g by mouth daily. 10/04/21   Pokhrel, Corrie Mckusick, MD  Sodium Phosphates (RA SALINE ENEMA RE) Place 1 each rectally daily as needed.    [provider]  Vitamin D3 (VITAMIN D) 25 MCG tablet Take 1,000 Units by mouth daily. For supplement    [provider]  zinc gluconate 50 MG tablet  Take 1 tablet (50 mg total) by mouth daily. 11/01/19   Medina-Vargas, Monina C, NP      Allergies    Doxycycline, Macrobid [nitrofurantoin], Valsartan, Lisinopril, Calcium channel blockers, and Penicillins    Review of Systems   Review of Systems  Constitutional:  Negative for chills and fever.  HENT:  Negative for ear pain and sore throat.   Eyes:  Negative for pain and visual disturbance.  Respiratory:  Negative for cough and shortness of breath.   Cardiovascular:  Negative for chest pain and palpitations.  Gastrointestinal:  Negative for abdominal pain and vomiting.  Genitourinary:  Negative for dysuria and hematuria.  Musculoskeletal:  Negative for arthralgias and back pain.  Skin:  Negative for color change and rash.  Neurological:  Positive for weakness. Negative for seizures, syncope and headaches.  All other systems reviewed and are negative.  Physical Exam Updated Vital Signs BP (!) 169/101 (BP Location: Left Arm)    Pulse 92    Temp 98.3 F (36.8 C) (Oral)    Resp (!) 24    SpO2 94%  Physical Exam Vitals and nursing note reviewed.  Constitutional:      General: She is not in acute distress.    Appearance: She is well-developed.  HENT:     Head: Normocephalic and atraumatic.  Eyes:     Conjunctiva/sclera: Conjunctivae normal.  Cardiovascular:     Rate and Rhythm: Normal rate and regular rhythm.     Heart sounds: No murmur heard. Pulmonary:     Effort: Pulmonary effort is normal. No respiratory distress.     Breath sounds: Normal breath sounds.  Abdominal:     Palpations: Abdomen is soft.     Tenderness: There is no abdominal tenderness.  Musculoskeletal:        General: No swelling.     Cervical back: Neck supple.  Skin:    General: Skin is warm and dry.     Capillary Refill: Capillary refill takes less than 2 seconds.  Neurological:     Mental Status: She is alert.     Comments: Alert, oriented to person, answers basic questions appropriately, cranial nerves  II through XII intact except there is left homonymous hemianopsia, there is also slight left facial droop; strength and sensation is grossly intact in both left and right upper and lower extremities except may have slight more weakness on her left side  Psychiatric:        Mood and Affect: Mood normal.    ED Results / Procedures / Treatments   Labs (all labs ordered are listed, but only abnormal results are displayed) Labs Reviewed  CBC - Abnormal; Notable for the following components:      Result Value   WBC 12.7 (*)    RBC 2.96 (*)    Hemoglobin 7.8 (*)    HCT 25.8 (*)    RDW 16.1 (*)  nRBC 0.6 (*)    All other components within normal limits  DIFFERENTIAL - Abnormal; Notable for the following components:   Neutro Abs 8.0 (*)    Monocytes Absolute 1.1 (*)    Abs Immature Granulocytes 0.27 (*)    All other components within normal limits  COMPREHENSIVE METABOLIC PANEL - Abnormal; Notable for the following components:   Sodium 132 (*)    Chloride 96 (*)    Glucose, Bld 118 (*)    Total Protein 6.4 (*)    Albumin 2.6 (*)    AST 56 (*)    Alkaline Phosphatase 134 (*)    All other components within normal limits  RESP PANEL BY RT-PCR (FLU A&B, COVID) ARPGX2  ETHANOL  PROTIME-INR  RAPID URINE DRUG SCREEN, HOSP PERFORMED  URINALYSIS, ROUTINE W REFLEX MICROSCOPIC  I-STAT CHEM 8, ED    EKG EKG Interpretation  Date/Time:  Saturday December 13 2021 13:11:09 EST Ventricular Rate:  82 PR Interval:  158 QRS Duration: 106 QT Interval:  392 QTC Calculation: 458 R Axis:   -76 Text Interpretation: Sinus rhythm Ventricular premature complex Inferior infarct, old Consider anterior infarct Lateral leads are also involved No significant change since last tracing Confirmed by Madalyn Rob [phone]) on 12/13/2021 1:56:59 PM  Radiology No results found.  Procedures Procedures    Medications Ordered in ED Medications  gadobutrol (GADAVIST) 1 MMOL/ML injection 5 mL (5 mLs  Intravenous Contrast Given 12/13/21 1447)    ED Course/ Medical Decision Making/ A&P                           Medical Decision Making Amount and/or Complexity of Data Reviewed Labs: ordered. Radiology: ordered.  Risk Prescription drug management.   88 year old lady with notable history of metastatic breast cancer presenting to the ER with concern for left-sided weakness, facial droop.  On physical exam, noted slight drift on left side as well as left visual field deficit.  Symptom onset about 24 hours per report from nursing facility.  I discussed the case with our neurologist, Dr. Malen Gauze.  Given the last known well, outside of window for stroke alert.  He advised going straight to MRI brain with and without contrast given her metastatic breast cancer history.  Clinically concern for possibility of stroke versus metastases and brain.  Her basic lab work was reviewed, anemia slightly worse than baseline. No electrolyte derangement, mild leukocytosis. No fever.   While awaiting MRI results, signed out to Dr. Melina Copa.  He will follow-up, anticipate likely rediscussion with neurology.  I discussed the case with patient's daughter over the phone who later came to bedside.  Updated her throughout stay.        Final Clinical Impression(s) / ED Diagnoses Final diagnoses:  Stroke-like symptoms  Leukocytosis, unspecified type    Rx / DC Orders ED Discharge Orders     None         Lucrezia Starch, MD 12/13/21 1536

## 2021-12-13 NOTE — ED Notes (Signed)
Pt transported to MRI 

## 2021-12-14 ENCOUNTER — Inpatient Hospital Stay (HOSPITAL_COMMUNITY): Payer: Medicare PPO

## 2021-12-14 DIAGNOSIS — Z789 Other specified health status: Secondary | ICD-10-CM

## 2021-12-14 DIAGNOSIS — Z515 Encounter for palliative care: Secondary | ICD-10-CM

## 2021-12-14 DIAGNOSIS — I6389 Other cerebral infarction: Secondary | ICD-10-CM

## 2021-12-14 DIAGNOSIS — I639 Cerebral infarction, unspecified: Secondary | ICD-10-CM

## 2021-12-14 DIAGNOSIS — R531 Weakness: Secondary | ICD-10-CM

## 2021-12-14 DIAGNOSIS — I6300 Cerebral infarction due to thrombosis of unspecified precerebral artery: Secondary | ICD-10-CM

## 2021-12-14 DIAGNOSIS — D72829 Elevated white blood cell count, unspecified: Secondary | ICD-10-CM | POA: Diagnosis not present

## 2021-12-14 DIAGNOSIS — E7800 Pure hypercholesterolemia, unspecified: Secondary | ICD-10-CM | POA: Diagnosis not present

## 2021-12-14 DIAGNOSIS — I480 Paroxysmal atrial fibrillation: Secondary | ICD-10-CM | POA: Diagnosis not present

## 2021-12-14 DIAGNOSIS — R638 Other symptoms and signs concerning food and fluid intake: Secondary | ICD-10-CM

## 2021-12-14 DIAGNOSIS — C50919 Malignant neoplasm of unspecified site of unspecified female breast: Secondary | ICD-10-CM | POA: Diagnosis not present

## 2021-12-14 DIAGNOSIS — E871 Hypo-osmolality and hyponatremia: Secondary | ICD-10-CM

## 2021-12-14 DIAGNOSIS — Z7189 Other specified counseling: Secondary | ICD-10-CM

## 2021-12-14 DIAGNOSIS — I5022 Chronic systolic (congestive) heart failure: Secondary | ICD-10-CM | POA: Diagnosis not present

## 2021-12-14 DIAGNOSIS — D649 Anemia, unspecified: Secondary | ICD-10-CM

## 2021-12-14 DIAGNOSIS — Z66 Do not resuscitate: Secondary | ICD-10-CM

## 2021-12-14 LAB — ECHOCARDIOGRAM COMPLETE: S' Lateral: 3 cm

## 2021-12-14 LAB — COMPREHENSIVE METABOLIC PANEL
ALT: 10 U/L (ref 0–44)
AST: 48 U/L — ABNORMAL HIGH (ref 15–41)
Albumin: 2.7 g/dL — ABNORMAL LOW (ref 3.5–5.0)
Alkaline Phosphatase: 141 U/L — ABNORMAL HIGH (ref 38–126)
Anion gap: 16 — ABNORMAL HIGH (ref 5–15)
BUN: 19 mg/dL (ref 8–23)
CO2: 21 mmol/L — ABNORMAL LOW (ref 22–32)
Calcium: 9 mg/dL (ref 8.9–10.3)
Chloride: 100 mmol/L (ref 98–111)
Creatinine, Ser: 1.04 mg/dL — ABNORMAL HIGH (ref 0.44–1.00)
GFR, Estimated: 53 mL/min — ABNORMAL LOW (ref >60)
Glucose, Bld: 87 mg/dL (ref 70–99)
Potassium: 3.8 mmol/L (ref 3.5–5.1)
Sodium: 137 mmol/L (ref 135–145)
Total Bilirubin: 0.8 mg/dL (ref 0.3–1.2)
Total Protein: 6.2 g/dL — ABNORMAL LOW (ref 6.5–8.1)

## 2021-12-14 LAB — CBC
HCT: 26.3 % — ABNORMAL LOW (ref 36.0–46.0)
Hemoglobin: 8.1 g/dL — ABNORMAL LOW (ref 12.0–15.0)
MCH: 26.5 pg (ref 26.0–34.0)
MCHC: 30.8 g/dL (ref 30.0–36.0)
MCV: 85.9 fL (ref 80.0–100.0)
Platelets: 333 10*3/uL (ref 150–400)
RBC: 3.06 MIL/uL — ABNORMAL LOW (ref 3.87–5.11)
RDW: 16.4 % — ABNORMAL HIGH (ref 11.5–15.5)
WBC: 11.7 10*3/uL — ABNORMAL HIGH (ref 4.0–10.5)
nRBC: 1 % — ABNORMAL HIGH (ref 0.0–0.2)

## 2021-12-14 LAB — LIPID PANEL
Cholesterol: 133 mg/dL (ref 0–200)
HDL: 35 mg/dL — ABNORMAL LOW (ref >40)
LDL Cholesterol: 67 mg/dL (ref 0–99)
Total CHOL/HDL Ratio: 3.8 RATIO
Triglycerides: 153 mg/dL — ABNORMAL HIGH (ref <150)
VLDL: 31 mg/dL (ref 0–40)

## 2021-12-14 LAB — IRON AND TIBC
Iron: 50 ug/dL (ref 28–170)
Saturation Ratios: 27 % (ref 10.4–31.8)
TIBC: 188 ug/dL — ABNORMAL LOW (ref 250–450)
UIBC: 138 ug/dL

## 2021-12-14 LAB — RETICULOCYTES
Immature Retic Fract: 38.4 % — ABNORMAL HIGH (ref 2.3–15.9)
RBC.: 2.99 MIL/uL — ABNORMAL LOW (ref 3.87–5.11)
Retic Count, Absolute: 61.9 10*3/uL (ref 19.0–186.0)
Retic Ct Pct: 2.1 % (ref 0.4–3.1)

## 2021-12-14 LAB — FERRITIN: Ferritin: 1715 ng/mL — ABNORMAL HIGH (ref 11–307)

## 2021-12-14 MED ORDER — GLYCOPYRROLATE 1 MG PO TABS
1.0000 mg | ORAL_TABLET | ORAL | Status: DC | PRN
Start: 2021-12-14 — End: 2021-12-16
  Filled 2021-12-14: qty 1

## 2021-12-14 MED ORDER — GLYCOPYRROLATE 0.2 MG/ML IJ SOLN: 0.2000 mg | INTRAMUSCULAR | Status: DC | PRN

## 2021-12-14 MED ORDER — HALOPERIDOL LACTATE 2 MG/ML PO CONC
2.0000 mg | Freq: Four times a day (QID) | ORAL | Status: DC | PRN
  Filled 2021-12-14: qty 1

## 2021-12-14 MED ORDER — POLYVINYL ALCOHOL 1.4 % OP SOLN
1.0000 [drp] | Freq: Four times a day (QID) | OPHTHALMIC | Status: DC | PRN
  Filled 2021-12-14: qty 15

## 2021-12-14 MED ORDER — FENTANYL CITRATE PF 50 MCG/ML IJ SOSY: 25.0000 ug | PREFILLED_SYRINGE | INTRAMUSCULAR | Status: DC | PRN

## 2021-12-14 MED ORDER — LIDOCAINE 5 % EX PTCH
1.0000 | MEDICATED_PATCH | CUTANEOUS | Status: DC
  Administered 2021-12-14 – 2021-12-15 (×2): 1 via TRANSDERMAL
  Filled 2021-12-14 (×2): qty 1

## 2021-12-14 MED ORDER — ONDANSETRON 4 MG PO TBDP: 4.0000 mg | ORAL_TABLET | Freq: Four times a day (QID) | ORAL | Status: DC | PRN

## 2021-12-14 MED ORDER — APIXABAN 5 MG PO TABS
5.0000 mg | ORAL_TABLET | Freq: Two times a day (BID) | ORAL | Status: DC
  Administered 2021-12-14 – 2021-12-15 (×2): 5 mg via ORAL
  Filled 2021-12-14 (×2): qty 1

## 2021-12-14 MED ORDER — BIOTENE DRY MOUTH MT LIQD: 15.0000 mL | OROMUCOSAL | Status: DC | PRN

## 2021-12-14 MED ORDER — LORAZEPAM 1 MG PO TABS: 1.0000 mg | ORAL_TABLET | ORAL | Status: DC | PRN

## 2021-12-14 MED ORDER — HALOPERIDOL LACTATE 5 MG/ML IJ SOLN: 2.0000 mg | Freq: Four times a day (QID) | INTRAMUSCULAR | Status: DC | PRN

## 2021-12-14 MED ORDER — ONDANSETRON HCL 4 MG/2ML IJ SOLN: 4.0000 mg | Freq: Four times a day (QID) | INTRAMUSCULAR | Status: DC | PRN

## 2021-12-14 MED ORDER — LORAZEPAM 2 MG/ML PO CONC: 1.0000 mg | ORAL | Status: DC | PRN

## 2021-12-14 MED ORDER — HALOPERIDOL 0.5 MG PO TABS: 2.0000 mg | ORAL_TABLET | Freq: Four times a day (QID) | ORAL | Status: DC | PRN

## 2021-12-14 MED ORDER — LORAZEPAM 2 MG/ML IJ SOLN: 1.0000 mg | INTRAMUSCULAR | Status: DC | PRN

## 2021-12-14 NOTE — Evaluation (Signed)
Occupational Therapy Evaluation Patient Details Name: Barbara Kelley MRN: [id number] DOB: 06-30-1934 Today's Date: 12/14/2021   History of Present Illness Barbara Kelley is a 88 y.o. female wjo presented from [place] with general weakness and speech problem. Work up revealed multiple scattered acute ischemic infarcts likely due to hypercoagulability due to  metastatic breast cancer vs cardioembolic source. PMHx: chronic HFrEF, metastatic breast cancer, hypercalcemia, stroke (with L residual deficits), chronic microcytic anemia   Clinical Impression   Barbara Kelley was evaluated s/p the above admission list, she is from ALF and reports using a rollator and wc for longer distances, and requiring some assist for ADLs. Pt presented with impaired cognition, and poor attention to the L throughout the session. She benefited from repetitive cues for sequencing, safety and problem solving. Overall she required min G-min A +2 for transfers and pivotal steps with RW, and up to max A for ADLs including pericare in standing at the bedside after an incontinent BM. Pt also with reports of "seeing things" but unable to further detail images. Pt will benefit from OT acutely. Recommend d/c back to ALF with increased assistance (near 24//7), and HHOT.      Recommendations for follow up therapy are one component of a multi-disciplinary discharge planning process, led by the attending physician.  Recommendations may be updated based on patient status, additional functional criteria and insurance authorization.   Follow Up Recommendations  Home health OT (back to ALF with increased assist and [facility])    Assistance Recommended at Discharge Frequent or constant Supervision/Assistance  Patient can return home with the following A little help with walking and/or transfers;A lot of help with bathing/dressing/bathroom;Direct supervision/assist for medications management    Functional Status Assessment  Patient has had a recent  decline in their functional status and demonstrates the ability to make significant improvements in function in a reasonable and predictable amount of time.  Equipment Recommendations  None recommended by OT    Recommendations for Other Services       Precautions / Restrictions Precautions Precautions: Fall Restrictions Weight Bearing Restrictions: No      Mobility Bed Mobility Overal bed mobility: Needs Assistance Bed Mobility: Supine to Sit     Supine to sit: Mod assist     General bed mobility comments: pt wit poor sustained attention to the L, cues for gettting OOB to the L and sequencing task. mod A for hips and trunk elevation    Transfers Overall transfer level: Needs assistance Equipment used: Rolling walker (2 wheels) Transfers: Sit to/from Stand, Bed to chair/wheelchair/BSC Sit to Stand: Min assist, Min guard, +2 safety/equipment     Step pivot transfers: Min assist, +2 safety/equipment     General transfer comment: min G for initial sit<>stand transfers, min A after fatigue. poor hand placement...like baseline. min A for pivotal steps for cues, LOB and safety      Balance Overall balance assessment: Needs assistance Sitting-balance support: Feet supported Sitting balance-Leahy Scale: Fair     Standing balance support: Bilateral upper extremity supported, During functional activity Standing balance-Leahy Scale: Poor                             ADL either performed or assessed with clinical judgement   ADL Overall ADL's : Needs assistance/impaired Eating/Feeding: NPO   Grooming: Minimal assistance;Sitting   Upper Body Bathing: Minimal assistance;Sitting   Lower Body Bathing: Maximal assistance;Sit to/from stand   Upper Body Dressing :  Minimal assistance;Sitting   Lower Body Dressing: Maximal assistance;Sit to/from stand   Toilet Transfer: Moderate assistance;Ambulation;Rolling walker (2 wheels) Toilet Transfer Details (indicate cue  type and reason): short distance only Toileting- Clothing Manipulation and Hygiene: Maximal assistance;Sit to/from stand Toileting - Clothing Manipulation Details (indicate cue type and reason): pt had incontinent BM this session, max A for hygiene in standing at the bed side     Functional mobility during ADLs: Moderate assistance;Cueing for safety;Cueing for sequencing;Rolling walker (2 wheels) General ADL Comments: limited by generalized weakness, L coordination, impaired cognition and poor activity tolerance. She required mod-max cues for all tasks assessed     Vision Baseline Vision/History: 1 Wears glasses Ability to See in Adequate Light: 0 Adequate Patient Visual Report: No change from baseline Vision Assessment?: Yes Eye Alignment: Within Functional Limits Alignment/Gaze Preference: Other (comment) Tracking/Visual Pursuits: Impaired - to be further tested in functional context;Requires cues, head turns, or add eye shifts to track Saccades: Impaired - to be further tested in functional context Convergence: Within functional limits Additional Comments: poor attention to the L environment, unable to sustain attention to object on the left for more than a second. required head turns and cues throughout, difficult to fully test due to impaired cog/command following     Perception     Praxis      Pertinent Vitals/Pain Pain Assessment Pain Assessment: No/denies pain     Hand Dominance Right   Extremity/Trunk Assessment Upper Extremity Assessment Upper Extremity Assessment: LUE deficits/detail;RUE deficits/detail RUE Deficits / Details: generally weak 4-/5 MMT. slow and deliberate coordination but [facility] RUE Sensation: WNL LUE Deficits / Details: ROM is WFL, denies paresthesia. Residual weakness and impaired coordination from prior stroke. LUE Sensation: WNL LUE Coordination: decreased fine motor   Lower Extremity Assessment Lower Extremity Assessment: Defer to PT evaluation        Communication Communication Communication: Expressive difficulties (some word finding vs. confusion noted)   Cognition Arousal/Alertness: Awake/alert Behavior During Therapy: Flat affect, Anxious Overall Cognitive Status: No family/caregiver present to determine baseline cognitive functioning Area of Impairment: Orientation, Attention, Following commands, Memory, Safety/judgement, Awareness, Problem solving                 Orientation Level: Disoriented to, Time Current Attention Level: Focused Memory: Decreased short-term memory Following Commands: Follows one step commands inconsistently, Follows one step commands with increased time Safety/Judgement: Decreased awareness of safety, Decreased awareness of deficits Awareness: Intellectual Problem Solving: Slow processing, Decreased initiation, Difficulty sequencing, Requires verbal cues General Comments: "1923" but otherwise oriented. Pt with poor attention to task, required repetivite cues and incr time. Impaired attention to L compared to R noted throughout session noted. Poor recall of PLOF. Reported that she has been "seeing things," unable to describe images vs. dark spots. Pt also reported having "insecurities" with mobility and fear of falling     General Comments  VSS on RA, pt with reports of feeling "lightheaded" but vitals stable. BM this session    Exercises     Shoulder Instructions      Home Living Family/patient expects to be discharged to:: Assisted living Living Arrangements: Non-relatives/Friends Available Help at Discharge: Family;Available PRN/intermittently Type of Home: Apartment Home Access: Level entry     Home Layout: One level     Bathroom Shower/Tub: Occupational psychologist: Standard     Home Equipment: Rollator (4 wheels);Wheelchair - manual   Additional Comments: pt has a roommate at ALF?      Prior Functioning/Environment  Prior Level of Function : Needs assist              Mobility Comments: Uses rollator PTA, daughter pushes w/c to get to dining hall. Falls reported at rehab a few months ago. ADLs Comments: reports she requires assist for dressing, pt wtih increased confusion during ADL questions. "I can't remember." Stated she does not wear socks, wears slip on shoes        OT Problem List: Decreased strength;Decreased range of motion;Decreased activity tolerance;Impaired balance (sitting and/or standing);Decreased coordination;Decreased cognition;Decreased safety awareness;Decreased knowledge of use of DME or AE;Decreased knowledge of precautions      OT Treatment/Interventions: Self-care/ADL training;Therapeutic exercise;Balance training;Patient/family education;Therapeutic activities;DME and/or AE instruction    OT Goals(Current goals can be found in the care plan section) Acute Rehab OT Goals Patient Stated Goal: home OT Goal Formulation: With patient Time For Goal Achievement: 12/28/21 Potential to Achieve Goals: Good ADL Goals Pt Will Perform Grooming: with modified independence;standing Pt Will Perform Lower Body Dressing: with modified independence;sit to/from stand Pt Will Transfer to Toilet: with modified independence;ambulating Additional ADL Goal #1: Pt will demonstrated increased activity tolerance to perform 2 grooming tasks in standing with supervision A  OT Frequency: Min 2X/week    Co-evaluation PT/OT/SLP Co-Evaluation/Treatment: Yes Reason for Co-Treatment: Complexity of the patient's impairments (multi-system involvement);For patient/therapist safety;To address functional/ADL transfers   OT goals addressed during session: ADL's and self-care      AM-PAC OT "6 Clicks" Daily Activity     Outcome Measure Help from another person eating meals?: Total Help from another person taking care of personal grooming?: A Little Help from another person toileting, which includes using toliet, bedpan, or urinal?: A Lot Help from  another person bathing (including washing, rinsing, drying)?: A Lot Help from another person to put on and taking off regular upper body clothing?: A Little Help from another person to put on and taking off regular lower body clothing?: A Lot 6 Click Score: 13   End of Session Equipment Utilized During Treatment: Gait belt;Rolling walker (2 wheels) Nurse Communication: Mobility status  Activity Tolerance: Patient tolerated treatment well Patient left: in chair;with call bell/phone within reach;with chair alarm set  OT Visit Diagnosis: Unsteadiness on feet (R26.81);Other abnormalities of gait and mobility (R26.89);Muscle weakness (generalized) (M62.81);History of falling (Z91.81);Hemiplegia and hemiparesis Hemiplegia - Right/Left: Left Hemiplegia - dominant/non-dominant: Non-Dominant Hemiplegia - caused by: Cerebral infarction                Time: 8309-4076 OT Time Calculation (min): 26 min Charges:  OT General Charges $OT Visit: 1 Visit OT Evaluation $OT Eval Moderate Complexity: 1 Mod   Barbara Kelley A Barbara Kelley 12/14/2021, 10:32 AM

## 2021-12-14 NOTE — Plan of Care (Signed)
Pt is alert oriented x 3, c/o pain to left arm prn tylenol suppository given per order. Pt has purewick in place.  ? ? ?Problem: Education: ?Goal: Knowledge of General Education information will improve ?Description: Including pain rating scale, medication(s)/side effects and non-pharmacologic comfort measures ?Outcome: Progressing ?  ?Problem: Health Behavior/Discharge Planning: ?Goal: Ability to manage health-related needs will improve ?Outcome: Progressing ?  ?Problem: Clinical Measurements: ?Goal: Ability to maintain clinical measurements within normal limits will improve ?Outcome: Progressing ?Goal: Will remain free from infection ?Outcome: Progressing ?Goal: Diagnostic test results will improve ?Outcome: Progressing ?Goal: Respiratory complications will improve ?Outcome: Progressing ?Goal: Cardiovascular complication will be avoided ?Outcome: Progressing ?  ?Problem: Activity: ?Goal: Risk for activity intolerance will decrease ?Outcome: Progressing ?  ?Problem: Nutrition: ?Goal: Adequate nutrition will be maintained ?Outcome: Progressing ?  ?Problem: Coping: ?Goal: Level of anxiety will decrease ?Outcome: Progressing ?  ?Problem: Elimination: ?Goal: Will not experience complications related to bowel motility ?Outcome: Progressing ?Goal: Will not experience complications related to urinary retention ?Outcome: Progressing ?  ?Problem: Pain Managment: ?Goal: General experience of comfort will improve ?Outcome: Progressing ?  ?Problem: Safety: ?Goal: Ability to remain free from injury will improve ?Outcome: Progressing ?  ?Problem: Skin Integrity: ?Goal: Risk for impaired skin integrity will decrease ?Outcome: Progressing ?  ?Problem: Education: ?Goal: Knowledge of disease or condition will improve ?Outcome: Progressing ?Goal: Knowledge of secondary prevention will improve (SELECT ALL) ?Outcome: Progressing ?Goal: Knowledge of patient specific risk factors will improve (INDIVIDUALIZE FOR PATIENT) ?Outcome:  Progressing ?Goal: Individualized Educational Video(s) ?Outcome: Progressing ?  ?Problem: Self-Care: ?Goal: Ability to participate in self-care as condition permits will improve ?Outcome: Progressing ?  ?

## 2021-12-14 NOTE — Hospital Course (Signed)
Patient is a 88 year old female with history of chronic HFrEF, metastatic breast CA, hypercalcemia, prior stroke, chronic anemia presented with left facial droop, left-sided weakness and slurred speech.  Per patient, noticed her symptoms prior to going to bed the night before. ?MRI brain showed multiple scattered acute infarcts.  Neurology consulted. ? ?

## 2021-12-14 NOTE — Evaluation (Signed)
Clinical/Bedside Swallow Evaluation ?Patient Details  ?Name: Barbara Kelley ?MRN: [id number] ?Date of Birth: [birth year] ? ?Today's Date: 12/14/2021 ?Time: SLP Start Time (ACUTE ONLY): 1225 SLP Stop Time (ACUTE ONLY): 1240 ?SLP Time Calculation (min) (ACUTE ONLY): 15 min ? ?Past Medical History:  ?Past Medical History:  ?Diagnosis Date  ? Arthritis   ? Cardiomyopathy ([place])   ? Cataract   ? NS OU  ? Congestive heart failure (CHF) ([place])   ? Dyspnea   ? GERD (gastroesophageal reflux disease)   ? currently non problematic   ? Hypertension   ? Hypertensive retinopathy   ? OD  ? Neuropathy   ? related to stroke ; deficit of CVA in 2008  ? Pneumonia   ? x2 most recent fall of 2017   ? PONV (postoperative nausea and vomiting)   ? Stroke [facility] 2008  ? 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 ?Past Surgical History:  ?Past Surgical History:  ?Procedure Laterality Date  ? ABDOMINAL HYSTERECTOMY    ? APPENDECTOMY    ? BLADDER SUSPENSION    ? CARDIAC CATHETERIZATION  10/2015  ? CHOLECYSTECTOMY    ? HEMIARTHROPLASTY HIP Right 10/20/2017  ? HIP ARTHROPLASTY Right 10/19/2017  ? Procedure: ARTHROPLASTY BIPOLAR HIP (HEMIARTHROPLASTY);  Surgeon: Paralee Cancel, MD;  Location: [facility];  Service: Orthopedics;  Laterality: Right;  ? JOINT REPLACEMENT Left 2013  ? left hip  ? TONSILLECTOMY    ? TOTAL KNEE ARTHROPLASTY Left 05/04/2017  ? Procedure: LEFT TOTAL KNEE ARTHROPLASTY;  Surgeon: Paralee Cancel, MD;  Location: [facility];  Service: Orthopedics;  Laterality: Left;  70 mins  ? TUBAL LIGATION    ? ?HPI:  ?Pt is an 88 y.o. female who presented with left facial droop, left weakness and slurred speech. MRI brain 3/11: Small acute infarcts scattered throughout both cerebral  hemispheres and left cerebellum, query central emboli. [facility] not completed since pt is on thickened liquids at baseline. SLP called [name] and she was not aware of a time when patient was on thickened liquids. PMH: chronic HFrEF, metastatic breast  cancer, hypercalcemia, stroke, chronic microcytic anemia.  ?  ?Assessment / Plan / Recommendation  ?Clinical Impression ? Patient did not present with any overt s/s aspiration, penetration and did not display any clinical s/s of dysphagia as per this bedside/clinical swallow evaluation. Evaluation was limited by patient's fatigue and so she only accepted several sips of thin liquids (water) and a couple bites of puree (applesauce). Patient reported to SLP that she has not had much of an appetite for past 4 months and that she has been eating foods that were softer (not fully pureed). SLP spoke with [name] on phone and she reported that as far as she knew, patient was on a regular solids, thin liquids diet at her ALF. SLP is recommending at least one f/u, preferably with meal to ensure she is tolerating PO's without significant difficulty. ?SLP Visit Diagnosis: Dysphagia, unspecified (R13.10) ?   ?Aspiration Risk ? Mild aspiration risk;No limitations  ?  ?Diet Recommendation Dysphagia 3 (Mech soft);Thin liquid  ? ?Liquid Administration via: Cup;Straw ?Medication Administration: Whole meds with puree ?Supervision: Full supervision/cueing for compensatory strategies;Staff to assist with self feeding ?Compensations: Slow rate;Small sips/bites ?Postural Changes: Seated upright at 90 degrees;Other (Comment) (seated as upright as tolerated)  ?  ?Other  Recommendations Oral Care Recommendations: Oral care BID;Staff/trained caregiver to provide oral care   ? ?Recommendations for follow up therapy are one  component of a multi-disciplinary discharge planning process, led by the attending physician.  Recommendations may be updated based on patient status, additional functional criteria and insurance authorization. ? ?Follow up Recommendations Other (comment) (f/u SLP services at next venue of care)  ? ? ?  ?Assistance Recommended at Discharge Frequent or constant Supervision/Assistance  ?Functional Status  Assessment Patient has had a recent decline in their functional status and demonstrates the ability to make significant improvements in function in a reasonable and predictable amount of time.  ?Frequency and Duration min 1 x/week  ?1 week ?  ?   ? ?Prognosis Prognosis for Safe Diet Advancement: Good  ? ?  ? ?Swallow Study   ?General Date of Onset: 12/13/21 ?HPI: Pt is an 88 y.o. female who presented with left facial droop, left weakness and slurred speech. MRI brain 3/11: Small acute infarcts scattered throughout both cerebral  hemispheres and left cerebellum, query central emboli. [facility] not completed since pt is on thickened liquids at baseline. SLP called [name] and she was not aware of a time when patient was on thickened liquids. PMH: chronic HFrEF, metastatic breast cancer, hypercalcemia, stroke, chronic microcytic anemia. ?Type of Study: Bedside Swallow Evaluation ?Previous Swallow Assessment: none found ?Diet Prior to this Study: NPO ?Temperature Spikes Noted: No ?Respiratory Status: Room air ?History of Recent Intubation: No ?Behavior/Cognition: Alert;Cooperative;Pleasant mood;Lethargic/Drowsy ?Oral Cavity Assessment: Within Functional Limits ?Oral Care Completed by SLP: Recent completion by staff ?Oral Cavity - Dentition: Adequate natural dentition ?Self-Feeding Abilities: Needs set up;Needs assist ?Patient Positioning: Upright in bed ?Baseline Vocal Quality: Normal ?Volitional Cough: Cognitively unable to elicit ?Volitional Swallow: Unable to elicit  ?  ?Oral/Motor/Sensory Function Overall Oral Motor/Sensory Function: Within functional limits   ?Ice Chips     ?Thin Liquid Thin Liquid: Within functional limits ?Presentation: Straw  ?  ?Nectar Thick     ?Honey Thick     ?Puree Puree: Within functional limits   ?Solid ? ? ?  Solid: Not tested ?Other Comments: patient pleasantly declined  ? ?  ?Sonia Baller, MA, CCC-SLP ?Speech Therapy ? ? ? ? ?

## 2021-12-14 NOTE — Assessment & Plan Note (Addendum)
-   Unclear etiology, improving possibly stress demargination  ?-Urine culture showed more than 100,000 colonies of multiple species, no symptoms of UTI.  No fevers  ?Chest x-ray showed vascular congestion, no pneumonia ?

## 2021-12-14 NOTE — Assessment & Plan Note (Addendum)
-   2D echo showed EF of 40 to 45%, indeterminate diastolic parameters, moderately elevated pulmonary artery systolic pressure ?-Continue Coreg for rate control for paroxysmal A-fib.  Hold Entresto, Lasix, currently comfort care ?

## 2021-12-14 NOTE — Assessment & Plan Note (Signed)
-   HDL 35, LDL 67, triglycerides 153, not on any statins outpatient ?

## 2021-12-14 NOTE — Assessment & Plan Note (Addendum)
-   CTA head and neck also shows widespread osseous metastatic disease, stage IV ?-Diagnosed in December 2022, was recommended palliative care. ?-Given embolic stroke, atrial fibrillation, resultant dysphagia, progression of metastatic disease, palliative medicine was consulted.  Goals of care meeting was done and family requested hospice and comfort care. ?

## 2021-12-14 NOTE — Progress Notes (Signed)
? ? ? [facility] ?                                                                            ? ? ?Barbara Kelley, is a 88 y.o. female, DOB - Jan 18, 1934, HYI:[id number] ?Admit date - 12/13/2021    ?Outpatient Primary MD for the patient is [place], [place] ? ?LOS - 1  days ? ? ? ?Brief summary  ? ?Patient is a 88 year old female with history of chronic HFrEF, metastatic breast CA, hypercalcemia, prior stroke, chronic anemia presented with left facial droop, left-sided weakness and slurred speech.  Per patient, noticed her symptoms prior to going to bed the night before. ?MRI brain showed multiple scattered acute infarcts.  Neurology consulted. ? ? ? ?Assessment & Plan  ? ? 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 ?-MRI of the brain with multiple scattered acute infarcts likely due to hypercoagulability, paroxysmal atrial fibrillation with RVR and history of metastatic breast CA.  ?- Neurology consulted.   ?-CTA head and neck with intracranial atherosclerosis without a large vessel occlusion, moderate right and severe left P2 stenosis, severe right V1 and V4 stenosis.  Widespread osseous metastasis ?-2D echo pending ?-Failed bedside swallow screen, SLP evaluation pending.  Currently on aspirin PR ?-PT OT evaluation ? ? ?Paroxysmal A-fib (HCC) with RVR ?- In ED, noted to be in RVR, received 2 digoxin and as needed metoprolol ?-Will resume Coreg 6.25 mg twice daily once able to tolerate p.o. ?-Currently not on any anticoagulation outpatient.  CHA2DS2-VASc 6, will await stroke service recommendations regarding AC. ?-  No prior history of GI bleed but has anemia, baseline hemoglobin ~8.6.  Follow CBC ? ? ? ?Hyponatremia ?- Follow bmet, Lasix currently on hold. ? ?Normocytic anemia ?- Baseline hemoglobin~8.6, presented with hemoglobin of 7.8 ?-Follow CBC, no reports  of bleeding ?-Anemia panel consistent with anemia of chronic disease, follow FOBT, CBC ?-Transfuse if less than 7.5 ? ? ?HLD (hyperlipidemia) ?- HDL 35, LDL 67, triglycerides 153, not on any statins outpatient ? ?Chronic systolic heart failure ([place]) ?- Follow 2D echo, (2D echo 02/2020 had shown EF 35 to 40% with G1 DD) ?-Outpatient on on Lasix 40 mg daily, Coreg, Entresto, currently on hold.  N.p.o. due to dysphagia. ? ?Leukocytosis ?- Unclear etiology, possibly stress demargination will add on urine culture, follow CBC.  Chest x-ray showed vascular congestion, no pneumonia ? ?Metastatic breast cancer ([place]) ?- CTA head and neck also shows widespread osseous metastatic disease, stage IV ?-Diagnosed in December 2022, was recommended palliative care. ?-Given embolic stroke, atrial fibrillation, resultant dysphagia, progression of metastatic disease, will consult palliative medicine ? ? ? ?Code Status: DNR ?DVT Prophylaxis:  heparin injection 5,000 Units Start: 12/13/21 1745 ? ? ?Level of Care: Level of care: Telemetry Medical ?Family Communication:  ?Disposition Plan:     Remains inpatient appropriate: Dysphagia, awaiting swallow evaluation.  Stroke work-up in progress. ? ?Procedures:  ?MRI brain ?CT angiogram head and neck ? ?Consultants:   ? ?Neurology ? ?Antimicrobials:  ?None ? ?Medications ? ?  stroke: mapping our early stages of recovery  book   Does not apply Once  ?  stroke: mapping our early stages of recovery book   Does not apply Once  ? aspirin  300 mg Rectal Daily  ? Or  ? aspirin  325 mg Oral Daily  ? heparin  5,000 Units Subcutaneous Q8H  ? ? ? ?Subjective:  ? ?[name] was seen and examined today.  Still has left-sided weakness, failed swallow evaluation.  Heart rate improving, still in A-fib.  No fevers, chest pain or shortness of breath. ?Objective:  ? ?Vitals:  ? 12/13/21 2148 12/14/21 0105 12/14/21 0438 12/14/21 0815  ?BP: (!) 157/99 (!) 138/56 (!) 153/86 120/62  ?Pulse: (!) 111 (!) 107 (!) 107 95   ?Resp: 20 (!) 22 (!) 24 (!) 22  ?Temp: 98.4 ?F (36.9 ?C) 97.9 ?F (36.6 ?C) 98.2 ?F (36.8 ?C) 97.9 ?F (36.6 ?C)  ?TempSrc: Oral Oral Oral Oral  ?SpO2: 94% 95% 95% 100%  ? ? ?Intake/Output Summary (Last 24 hours) at 12/14/2021 1014 ?Last data filed at 12/14/2021 0700 ?Gross per 24 hour  ?Intake 723.38 ml  ?Output 250 ml  ?Net 473.38 ml  ? ?There were no vitals filed for this visit. ? ? ?Exam ?General: Alert and oriented, dysarthria, NAD ?Cardiovascular: Irregularly irregular ?Respiratory: Clear to auscultation bilaterally, no wheezing ?Gastrointestinal: Soft, nontender, nondistended, + bowel sounds ?Ext: no pedal edema bilaterally ?Neuro: Strength 4/5 LUE and LLE.  RUE and RLE 5/5 ? ? ?Data Reviewed:  I have personally reviewed following labs  ? ? ?CBC ?Lab Results  ?Component Value Date  ? WBC 12.7 (H) 12/13/2021  ? RBC 2.99 (L) 12/14/2021  ? HGB 7.8 (L) 12/13/2021  ? HCT 25.8 (L) 12/13/2021  ? MCV 87.2 12/13/2021  ? MCH 26.4 12/13/2021  ? PLT 344 12/13/2021  ? MCHC 30.2 12/13/2021  ? RDW 16.1 (H) 12/13/2021  ? LYMPHSABS 3.2 12/13/2021  ? MONOABS 1.1 (H) 12/13/2021  ? EOSABS 0.1 12/13/2021  ? BASOSABS 0.0 12/13/2021  ? ? ? ?Last metabolic panel ?Lab Results  ?Component Value Date  ? NA 132 (L) 12/13/2021  ? K 4.4 12/13/2021  ? CL 96 (L) 12/13/2021  ? CO2 23 12/13/2021  ? BUN 20 12/13/2021  ? CREATININE 0.83 12/13/2021  ? GLUCOSE 118 (H) 12/13/2021  ? GFRNONAA >60 12/13/2021  ? GFRAA 61 07/16/2020  ? CALCIUM 9.3 12/13/2021  ? PHOS 2.7 09/28/2021  ? PROT 6.4 (L) 12/13/2021  ? ALBUMIN 2.6 (L) 12/13/2021  ? LABGLOB 2.5 09/27/2021  ? AGRATIO 1.1 09/27/2021  ? BILITOT 1.0 12/13/2021  ? ALKPHOS 134 (H) 12/13/2021  ? AST 56 (H) 12/13/2021  ? ALT 8 12/13/2021  ? ANIONGAP 13 12/13/2021  ? ? ? ?Coagulation Profile: ?Recent Labs  ?Lab 12/13/21 ?1256  ?INR 1.1  ? ? ? ?Radiology Studies: I have personally reviewed the imaging studies  ?CT ANGIO HEAD NECK W WO CM ? ?Result Date: 12/13/2021 ?IMPRESSION: 1. Intracranial atherosclerosis  without a large vessel occlusion. 2. Moderate right and severe left P2 stenoses. 3. Severe right V1 and V4 stenoses. 4. Cervical carotid atherosclerosis without a significant stenosis. 5. Widespread osseous metastases. 6. Small bilateral pleural effusions. 7. Aortic Atherosclerosis (ICD10-I70.0). Electronically Signed   By: Logan Bores M.D.   On: 12/13/2021 17:39  ? ?MR Brain W and Wo Contrast ?Addendum Date: 12/13/2021   ?ADDENDUM REPORT: 12/13/2021 17:41 ADDENDUM: History of metastatic breast cancer with bone lesions involving the skull base, right mandibular condyle, and included upper cervical spine, not well evaluated due  to motion. Electronically Signed   By: Logan Bores M.D.   On: 12/13/2021 17:41  ?Result Date: 12/13/2021 ?IMPRESSION: 1. Motion degraded examination. 2. Small acute infarcts scattered throughout both cerebral hemispheres and left cerebellum, query central emboli. 3. Moderate to severe chronic small vessel ischemic disease. Electronically Signed: By: Logan Bores M.D. On: 12/13/2021 15:22  ? ?DG Chest Port 1 View ? ?IMPRESSION: Cardiomegaly, vascular congestion. Electronically Signed   By: Rolm Baptise M.D.   On: 12/13/2021 19:00   ? ? ? ? ?Estill Cotta M.D. ?[facility] ?12/14/2021, 10:14 AM ? ?Available via Epic secure chat 7am-7pm ?After 7 pm, please refer to night coverage provider listed on amion. ? ?  ?

## 2021-12-14 NOTE — Assessment & Plan Note (Addendum)
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 ?-MRI of the brain with multiple scattered acute infarcts likely due to hypercoagulability, paroxysmal atrial fibrillation with RVR and history of metastatic breast CA.  ?- Neurology consulted.   ?-CTA head and neck with intracranial atherosclerosis without a large vessel occlusion, moderate right and severe left P2 stenosis, severe right V1 and V4 stenosis.  Widespread osseous metastasis ?-2D echo showed EF 40 to 45%, indeterminate diastolic parameters, moderately elevated pulmonary artery systolic pressure. ?-Initially placed on aspirin, neurology recommended anticoagulation given embolic stroke.  Palliative medicine was consulted, overall poor prognosis with metastatic breast CA and progression of the disease, family requested to continue anticoagulation at the recommendation of neurology to prevent further strokes. ? ?

## 2021-12-14 NOTE — Progress Notes (Signed)
?  Echocardiogram ?2D Echocardiogram has been performed. ? ?Barbara Kelley ?12/14/2021, 12:08 PM ?

## 2021-12-14 NOTE — Evaluation (Addendum)
Physical Therapy Evaluation Patient Details Name: Barbara Kelley MRN: [id number] DOB: [birth year] Today's Date: 12/14/2021  History of Present Illness  Barbara Kelley is a 88 y.o. female wjo presented from [place] with general weakness and speech problem. Work up revealed multiple scattered acute ischemic infarcts likely due to hypercoagulability due to  metastatic breast cancer vs cardioembolic source. PMHx: chronic HFrEF, metastatic breast cancer, hypercalcemia, stroke (with L residual deficits), chronic microcytic anemia  Clinical Impression  Patient presents with generalized weakness, left inattention, cognitive deficits, impaired balance, decreased activity tolerance and impaired mobility s/p above. Pt not the best historian but reports living at [facility] and uses rollator for ambulation. Reports her daughter pushes her down to the dining hall in w/c. Today, pt requires Mod A for bed mobility and Min guard-Min A for transfers and short distance ambulation with use of RW for support. Noted to have fear of falling and repeating "I cant I can't" during activity. Cognitive deficits noted in attention, orientation, problem solving, following commands and awareness. If facility able to provide more hands on assist for ADls/ambulation initially, recommend return to ALF with follow up HHPT. However if facility not able to provide above support, pt may need SNF to maximize independence and mobility prior to return home. Will follow acutely.       Recommendations for follow up therapy are one component of a multi-disciplinary discharge planning process, led by the attending physician.  Recommendations may be updated based on patient status, additional functional criteria and insurance authorization.  Follow Up Recommendations Home health PT (at ALF)    Assistance Recommended at Discharge Frequent or constant Supervision/Assistance  Patient can return home with the following  A little help with  walking and/or transfers;A little help with bathing/dressing/bathroom;Direct supervision/assist for financial management;Help with stairs or ramp for entrance;Direct supervision/assist for medications management    Equipment Recommendations None recommended by PT  Recommendations for Other Services       Functional Status Assessment Patient has had a recent decline in their functional status and demonstrates the ability to make significant improvements in function in a reasonable and predictable amount of time.     Precautions / Restrictions Precautions Precautions: Fall Restrictions Weight Bearing Restrictions: No      Mobility  Bed Mobility Overal bed mobility: Needs Assistance Bed Mobility: Supine to Sit     Supine to sit: Mod assist, HOB elevated     General bed mobility comments: pt with poor sustained attention to the Left, cues for gettting OOB to the Left and sequencing task. mod A for hips and trunk elevation.    Transfers Overall transfer level: Needs assistance Equipment used: Rolling walker (2 wheels) Transfers: Sit to/from Stand, Bed to chair/wheelchair/BSC Sit to Stand: Min assist, Min guard, +2 safety/equipment   Step pivot transfers: Min assist, +2 safety/equipment       General transfer comment: min G for initial sit<>stand transfers, min A after fatigue. poor hand placement with pt pulling up on RW likely baseline. min A for pivotal steps with cues, LOB and safety.    Ambulation/Gait Ambulation/Gait assistance: Min assist, +2 safety/equipment Gait Distance (Feet): 4 Feet Assistive device: Rolling walker (2 wheels)         General Gait Details: Able to take a few steps with use of RW, fearful of falling; reports lightheadedness, BP stable.  Stairs            Wheelchair Mobility    Modified Rankin (Stroke Patients Only) Modified  Rankin (Stroke Patients Only) Pre-Morbid Rankin Score: Moderate disability Modified Rankin: Moderately severe  disability     Balance Overall balance assessment: Needs assistance Sitting-balance support: Feet supported, No upper extremity supported Sitting balance-Leahy Scale: Fair     Standing balance support: During functional activity, Reliant on assistive device for balance Standing balance-Leahy Scale: Poor Standing balance comment: Able to stand for pericare with UE support, fatigues.                             Pertinent Vitals/Pain Pain Assessment Pain Assessment: No/denies pain    Home Living Family/patient expects to be discharged to:: Assisted living Living Arrangements: Non-relatives/Friends (roommate?) Available Help at Discharge: Family;Available PRN/intermittently Type of Home: Apartment Home Access: Level entry       Home Layout: One level Home Equipment: Rollator (4 wheels);Wheelchair - manual Additional Comments: pt has a roommate at ALF?    Prior Function Prior Level of Function : Needs assist             Mobility Comments: Uses rollator PTA, daughter pushes w/c to get to dining hall. Falls reported at rehab a few months ago. ADLs Comments: reports she requires assist for dressing, pt wtih increased confusion during ADL questions. "I can't remember." Stated she does not wear socks, wears slip on shoes     Hand Dominance   Dominant Hand: Right    Extremity/Trunk Assessment   Upper Extremity Assessment Upper Extremity Assessment: Defer to OT evaluation RUE Deficits / Details: generally weak 4-/5 MMT. slow and deliberate coordination but [facility] RUE Sensation: WNL LUE Deficits / Details: ROM is WFL, denies paresthesia. Residual weakness and impaired coordination from prior stroke. LUE Sensation: WNL LUE Coordination: decreased fine motor    Lower Extremity Assessment Lower Extremity Assessment: Generalized weakness (Grossly ~4/5 throughout)    Cervical / Trunk Assessment Cervical / Trunk Assessment: Kyphotic  Communication    Communication: Expressive difficulties (word finding vs confusio noted)  Cognition Arousal/Alertness: Awake/alert Behavior During Therapy: Flat affect, Anxious Overall Cognitive Status: No family/caregiver present to determine baseline cognitive functioning Area of Impairment: Orientation, Attention, Following commands, Memory, Safety/judgement, Awareness, Problem solving                 Orientation Level: Disoriented to, Time Current Attention Level: Focused Memory: Decreased short-term memory Following Commands: Follows one step commands inconsistently, Follows one step commands with increased time Safety/Judgement: Decreased awareness of safety, Decreased awareness of deficits Awareness: Intellectual Problem Solving: Slow processing, Decreased initiation, Difficulty sequencing, Requires verbal cues General Comments: "1923" but otherwise oriented. Pt with poor attention to task, required repetivite cues and incr time. Impaired attention to L compared to R noted throughout session noted. Poor recall of PLOF. Reported that she has been "seeing things," unable to describe images vs. dark spots. Pt also reported having "insecurities" with mobility and fear of falling        General Comments General comments (skin integrity, edema, etc.): VSS on RA. Incontinent of stool upon standing.    Exercises     Assessment/Plan    PT Assessment Patient needs continued PT services  PT Problem List Decreased strength;Decreased mobility;Decreased safety awareness;Decreased cognition;Decreased activity tolerance;Decreased balance;Decreased coordination;Decreased range of motion       PT Treatment Interventions Therapeutic exercise;Gait training;Balance training;Neuromuscular re-education;Functional mobility training;Therapeutic activities;Patient/family education;Cognitive remediation    PT Goals (Current goals can be found in the Care Plan section)  Acute Rehab PT Goals Patient Stated Goal:  to go home PT Goal Formulation: With patient Time For Goal Achievement: 12/28/21 Potential to Achieve Goals: Fair    Frequency Min 4X/week     Co-evaluation PT/OT/SLP Co-Evaluation/Treatment: Yes Reason for Co-Treatment: Complexity of the patient's impairments (multi-system involvement);For patient/therapist safety;To address functional/ADL transfers PT goals addressed during session: Mobility/safety with mobility;Balance;Proper use of DME OT goals addressed during session: ADL's and self-care       AM-PAC PT "6 Clicks" Mobility  Outcome Measure Help needed turning from your back to your side while in a flat bed without using bedrails?: A Little Help needed moving from lying on your back to sitting on the side of a flat bed without using bedrails?: A Lot Help needed moving to and from a bed to a chair (including a wheelchair)?: A Little Help needed standing up from a chair using your arms (e.g., wheelchair or bedside chair)?: A Little Help needed to walk in hospital room?: A Little Help needed climbing 3-5 steps with a railing? : A Lot 6 Click Score: 16    End of Session Equipment Utilized During Treatment: Gait belt Activity Tolerance: Patient limited by fatigue;Patient tolerated treatment well Patient left: in chair;with call bell/phone within reach;with chair alarm set Nurse Communication: Mobility status PT Visit Diagnosis: Muscle weakness (generalized) (M62.81);Difficulty in walking, not elsewhere classified (R26.2);Unsteadiness on feet (R26.81)    Time: 9892-1194 PT Time Calculation (min) (ACUTE ONLY): 25 min   Charges:   PT Evaluation $PT Eval Moderate Complexity: 1 Mod          Marisa Severin, PT, DPT Acute Rehabilitation Services Pager [phone] Office [phone]     Marguarite Arbour A Sabra Heck 12/14/2021, 12:38 PM

## 2021-12-14 NOTE — Progress Notes (Addendum)
STROKE TEAM PROGRESS NOTE   INTERVAL HISTORY  Hip and head hurt. Working with PT and OT currently. Able to stand with walker and walk a few steps to the chair. She states that she is feeling better. Her symptoms were noticed by staff on 3/12, per patient she noticed them before going to be on 3/11.  Vitals:   12/13/21 2000 12/13/21 2148 12/14/21 0105 12/14/21 0438  BP: (!) 144/110 (!) 157/99 (!) 138/56 (!) 153/86  Pulse: (!) 54 (!) 111 (!) 107 (!) 107  Resp: (!) 23 20 (!) 22 (!) 24  Temp:  98.4 F (36.9 C) 97.9 F (36.6 C) 98.2 F (36.8 C)  TempSrc:  Oral Oral Oral  SpO2: 95% 94% 95% 95%   CBC:  Recent Labs  Lab 12/13/21 1256  WBC 12.7*  NEUTROABS 8.0*  HGB 7.8*  HCT 25.8*  MCV 87.2  PLT 740   Basic Metabolic Panel:  Recent Labs  Lab 12/13/21 1256  NA 132*  K 4.4  CL 96*  CO2 23  GLUCOSE 118*  BUN 20  CREATININE 0.83  CALCIUM 9.3   Lipid Panel:  Recent Labs  Lab 12/14/21 0045  CHOL 133  TRIG 153*  HDL 35*  CHOLHDL 3.8  VLDL 31  LDLCALC 67   HgbA1c: No results for input(s): HGBA1C in the last 168 hours. Urine Drug Screen:  Recent Labs  Lab 12/13/21 1750  LABOPIA NONE DETECTED  COCAINSCRNUR NONE DETECTED  LABBENZ NONE DETECTED  AMPHETMU NONE DETECTED  THCU NONE DETECTED  LABBARB NONE DETECTED    Alcohol Level  Recent Labs  Lab 12/13/21 1256  ETH <10    IMAGING past 24 hours CT ANGIO HEAD NECK W WO CM  Result Date: 12/13/2021 CLINICAL DATA:  Stroke/TIA, determine embolic source. Slurred speech and weakness. Small acute cerebral and cerebellar infarcts on MRI. EXAM: CT ANGIOGRAPHY HEAD AND NECK TECHNIQUE: Multidetector CT imaging of the head and neck was performed using the standard protocol during bolus administration of intravenous contrast. Multiplanar CT image reconstructions and MIPs were obtained to evaluate the vascular anatomy. Carotid stenosis measurements (when applicable) are obtained utilizing NASCET criteria, using the distal  internal carotid diameter as the denominator. RADIATION DOSE REDUCTION: This exam was performed according to the departmental dose-optimization program which includes automated exposure control, adjustment of the mA and/or kV according to patient size and/or use of iterative reconstruction technique. CONTRAST:  33m OMNIPAQUE IOHEXOL 350 MG/ML SOLN COMPARISON:  Head MRI 12/13/2021 FINDINGS: CT HEAD FINDINGS Brain: The small acute cerebral and cerebellar infarcts on MRI are overall not well shown on this mildly motion degraded CT. No acute intracranial hemorrhage, mass, midline shift, or extra-axial fluid collection is identified. There is a chronic infarct anteriorly in the left basal ganglia with ex vacuo dilatation of the frontal horn of the left lateral ventricle. There is a background of moderate to severe chronic small vessel ischemia in the cerebral white matter, and small chronic infarcts are again noted in the thalami and cerebellum. There is mild-to-moderate cerebral atrophy. Vascular: Reported below. Skull: No fracture. Sinuses: The visualized paranasal sinuses and mastoid air cells are clear. Orbits: Unremarkable. Review of the MIP images confirms the above findings CTA NECK FINDINGS Aortic arch: Incomplete imaging of the aortic arch. Common origin of the brachiocephalic and left common carotid arteries, a normal variant. Mild atherosclerosis in the included portion of the arch. Patent brachiocephalic and subclavian arteries without significant stenosis. Right carotid system: Patent with a small amount of calcified  and soft plaque at the carotid bifurcation. No evidence of a significant stenosis or dissection. Left carotid system: Patent with a small to moderate amount of predominantly calcified plaque at the carotid bifurcation. No evidence of a significant stenosis or dissection. Vertebral arteries: Patent without evidence of a dissection or a significant stenosis of the dominant left vertebral artery.  Severe stenosis of the right vertebral origin. Skeleton: Widespread lytic lesions throughout the cervical and included upper thoracic spine as well as ribs, sternum, right scapula, right mandibular condyle, and skull base. Other neck: No evidence of cervical lymphadenopathy or mass. Upper chest: Small bilateral pleural effusions, right larger than left. Review of the MIP images confirms the above findings CTA HEAD FINDINGS Anterior circulation: The internal carotid arteries are patent from skull base to carotid termini with mild atherosclerotic plaque bilaterally not resulting in significant stenosis. ACAs and MCAs are patent with widespread atherosclerotic irregularity but no evidence of a proximal branch occlusion or high-grade proximal stenosis. There is a mild-to-moderate left M1 stenosis. No aneurysm is identified. Posterior circulation: There is atherosclerotic irregularity of the left V4 segment without a flow limiting stenosis. There are severe stenoses of the right V4 segment at the PICA origin as well as at the vertebrobasilar junction. Patent PICA and SCA origins are seen bilaterally. The basilar artery is patent with moderate diffuse irregularity but no significant stenosis. There are robust posterior communicating arteries bilaterally with absent right and hypoplastic left P1 segments. There are moderate right and severe left P2 stenoses. No aneurysm is identified Venous sinuses: Patent. Anatomic variants: Fetal right PCA. Review of the MIP images confirms the above findings IMPRESSION: 1. Intracranial atherosclerosis without a large vessel occlusion. 2. Moderate right and severe left P2 stenoses. 3. Severe right V1 and V4 stenoses. 4. Cervical carotid atherosclerosis without a significant stenosis. 5. Widespread osseous metastases. 6. Small bilateral pleural effusions. 7. Aortic Atherosclerosis (ICD10-I70.0). Electronically Signed   By: Logan Bores M.D.   On: 12/13/2021 17:39   MR Brain W and Wo  Contrast  Addendum Date: 12/13/2021   ADDENDUM REPORT: 12/13/2021 17:41 ADDENDUM: History of metastatic breast cancer with bone lesions involving the skull base, right mandibular condyle, and included upper cervical spine, not well evaluated due to motion. Electronically Signed   By: Logan Bores M.D.   On: 12/13/2021 17:41   Result Date: 12/13/2021 CLINICAL DATA:  Neuro deficit, acute, stroke suspected. Visual field deficit, hx of cancer, concern for metastasis or stroke. EXAM: MRI HEAD WITHOUT AND WITH CONTRAST TECHNIQUE: Multiplanar, multiecho pulse sequences of the brain and surrounding structures were obtained without and with intravenous contrast. CONTRAST:  39m GADAVIST GADOBUTROL 1 MMOL/ML IV SOLN COMPARISON:  Head CT 09/26/2021 FINDINGS: The study is moderately motion degraded. Brain: There are small, acute, predominantly cortically based infarcts scattered throughout both cerebral hemispheres involving bilateral MCA, bilateral PCA, and right ACA territories. There are also small acute superior left cerebellar infarcts. A few scattered chronic cerebral microhemorrhages are noted. There is a chronic infarct anteriorly in the left basal ganglia with associated chronic blood products and ex vacuo dilatation of the frontal horn of the left lateral ventricle. Patchy to confluent T2 hyperintensities in the cerebral white matter bilaterally are nonspecific but compatible with moderate to severe chronic small vessel ischemic disease. Milder chronic small vessel changes are present in the pons. Small chronic infarcts are present in the thalami and cerebellum. Numerous small T2 hyperintensities throughout the basal ganglia bilaterally are compatible with dilated perivascular spaces, possibly with  some interspersed chronic lacunar infarcts. There is mild-to-moderate cerebral atrophy. No abnormal enhancement is identified. Vascular: Major intracranial vascular flow voids are preserved. Skull and upper cervical  spine: Unremarkable bone marrow signal para Sinuses/Orbits: Small mucous retention cyst in the left maxillary sinus. Clear mastoid air cells. Other: None. IMPRESSION: 1. Motion degraded examination. 2. Small acute infarcts scattered throughout both cerebral hemispheres and left cerebellum, query central emboli. 3. Moderate to severe chronic small vessel ischemic disease. Electronically Signed: By: Logan Bores M.D. On: 12/13/2021 15:22   DG Chest Port 1 View  Result Date: 12/13/2021 CLINICAL DATA:  Facial droop, slurred speech EXAM: PORTABLE CHEST 1 VIEW COMPARISON:  08/08/2021 FINDINGS: Cardiomegaly. Vascular congestion and aortic atherosclerosis. No overt edema. No effusions or acute bony abnormality. IMPRESSION: Cardiomegaly, vascular congestion. Electronically Signed   By: Rolm Baptise M.D.   On: 12/13/2021 19:00    PHYSICAL EXAM  Physical Exam  Constitutional: petite, elderly, pleasant caucasian female Cardiovascular: Normal rate and regular rhythm.  Respiratory: Effort normal, non-labored breathing  Neuro: Mental Status: Patient is awake, alert, oriented to person, place, and situation. Needs some prompting with the date but able to name the current and previous president.  Patient is able to give a clear and coherent history. No signs of aphasia or neglect Cranial Nerves: II: Visual Fields are full. Pupils are equal, round, and reactive to light.   III,IV, VI: EOMI without ptosis or diploplia.  V: Facial sensation is symmetric to temperature VII: Facial movement is symmetric resting and smiling VIII: Hearing is intact to voice X: Palate elevates symmetrically XI: Shoulder shrug is symmetric. XII: Tongue protrudes midline without atrophy or fasciculations.  Motor: Tone is normal. Bulk is normal. 5/5 strength was present in all four extremities.  Sensory: Sensation is symmetric to light touch and temperature in the arms and legs. No extinction to DSS present.  Deep Tendon  Reflexes: 2+ and symmetric in the biceps and patellae.  Plantars: Toes are downgoing bilaterally.  Cerebellar: FNF and HKS are intact bilaterally    ASSESSMENT/PLAN Ms. Barbara Kelley is a 88 y.o. 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 MRI shows multiple scattered acute infarcts. CTA showing widespread osseous metastases. Paroxysmal atrial fibrillation on the monitor in the ED.  Consider oral anticoagulation.  Primary team to consult palliative care given metastatic disease and stroke.  Stroke:  Small acute infarcts scattered throughout both cerebral hemispheres and left cerebellum likely secondary embolic source in the setting of paroxysmal atrial fibrillation and a hypercoagulable state in the setting of metastatic disease CTA head & neck Severe right V1 and V4 stenoses. Intracranial atherosclerosis without a large vessel occlusion. Cervical carotid atherosclerosis without a significant stenosis.Widespread osseous metastases. MRI  Small acute infarcts scattered throughout both cerebral hemispheres and left cerebellum, query central emboli. 2D Echo - pending LDL 67 HgbA1c No results found for requested labs within last 26280 hours. VTE prophylaxis - Subq heparin    Diet   Diet NPO time specified   No antithrombotic prior to admission, now on aspirin 325 mg daily.  Therapy recommendations:  Home health OT Disposition:  pending  Hypertension Home meds:  Coreg 6.25, lasix '40mg'$  Stable Permissive hypertension (OK if < 220/120) but gradually normalize in 5-7 days Long-term BP goal normotensive  Other Stroke Risk Factors Advanced Age >/= 47  Hx stroke/TIA  Left foot and hand mild weakness at baseline. Congestive heart failure Lasix  Other Active Problems Normocytic anemia Baseline hemoglobin-  8.6 Leukocytosis Metastatic breast cancer CTA head and neck shows osseous metastatic disease- diagnosed Dec 2022 Primary team to consult palliative care  Hospital day # 1  Patient seen and examined by NP/APP with MD. MD to update note as needed.   Janine Ores, DNP, FNP-BC [facility] Pager: [phone]  ATTENDING ATTESTATION: This 88 year old female with past medical history of prior CVA with left sided mild weakness, history of metastatic breast cancer hypertension CHF.  She presented with left facial droop worsening left-sided weakness and slurred speech.  Brain MRI shows multiple embolic appearing CVA.  Currently she is on aspirin and will discuss with her daughter about potentially starting anticoagulation.  Dr. Reeves Forth evaluated pt independently, reviewed imaging, chart, labs. Discussed and formulated plan with the APP. Please see APP note above for details.   Total 36 minutes spent on counseling patient and coordinating care, writing notes and reviewing chart.   Bulah Lurie,MD   ADDENDUM:  Discussed with patient's daughter during a palliative care meeting about risk and benefits of anticoagulation.  Family decided to move ahead with comfort care but did want to have anticoagulation on board prevent further strokes.  Echo with EF 40-45%. We will add Eliquis.  Neurology will sign off please call with questions.   To contact Stroke Continuity provider, please refer to [email or web address] After hours, contact General Neurology

## 2021-12-14 NOTE — Assessment & Plan Note (Addendum)
-   Resolved, sodium 132 at the time of admission, improved to 137 at the time of discharge. ?-Continue to hold Lasix.  Encourage p.o. diet. ?

## 2021-12-14 NOTE — Evaluation (Signed)
Speech Language Pathology Evaluation ?Patient Details ?Name: Barbara Kelley ?MRN: [id number] ?DOB: [birth year] ?Today's Date: 12/14/2021 ?Time: 1210-1225 ?SLP Time Calculation (min) (ACUTE ONLY): 15 min ? ?Problem List:  ?Patient Active Problem List  ? Diagnosis Date Noted  ? Paroxysmal A-fib ([place]) with RVR 12/14/2021  ? Metastatic breast cancer ([place]) 12/14/2021  ? Normocytic anemia 12/14/2021  ? Hyponatremia 12/14/2021  ? Leukocytosis 12/14/2021  ? Acute embolic CVA 33/35/4562  ? Generalized weakness 09/26/2021  ? AKI (acute kidney injury) ([place]) 09/26/2021  ? Hypercalcemia 09/26/2021  ? Lytic bone lesions on xray 09/26/2021  ? S/P thoracentesis   ? Shortness of breath   ? Acute on chronic heart failure ([place]) 10/09/2019  ? Mitral regurgitation 08/11/2018  ? Mild CAD 08/11/2018  ? Closed fracture of right hip ([place])   ? Closed subcapital fracture of neck of femur, right, initial encounter ([place]) 10/18/2017  ? Hypertensive heart disease without CHF 10/18/2017  ? NICM (nonischemic cardiomyopathy) ([place]) 10/18/2017  ? Chronic systolic heart failure ([place]) 10/18/2017  ? HLD (hyperlipidemia) 10/18/2017  ? Near syncope 10/18/2017  ? S/P left TKA 05/04/2017  ? S/P total knee replacement 05/04/2017  ? History of CVA (cerebrovascular accident) 10/05/2006  ? ?Past Medical History:  ?Past Medical History:  ?Diagnosis Date  ? Arthritis   ? Cardiomyopathy ([place])   ? Cataract   ? NS OU  ? Congestive heart failure (CHF) ([place])   ? Dyspnea   ? GERD (gastroesophageal reflux disease)   ? currently non problematic   ? Hypertension   ? Hypertensive retinopathy   ? OD  ? Neuropathy   ? related to stroke ; deficit of CVA in 2008  ? Pneumonia   ? x2 most recent fall of 2017   ? PONV (postoperative nausea and vomiting)   ? Stroke [facility]) 2008  ? 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 ?Past Surgical History:  ?Past Surgical History:  ?Procedure Laterality Date  ? ABDOMINAL HYSTERECTOMY    ? APPENDECTOMY    ? BLADDER SUSPENSION    ?  CARDIAC CATHETERIZATION  10/2015  ? CHOLECYSTECTOMY    ? HEMIARTHROPLASTY HIP Right 10/20/2017  ? HIP ARTHROPLASTY Right 10/19/2017  ? Procedure: ARTHROPLASTY BIPOLAR HIP (HEMIARTHROPLASTY);  Surgeon: Paralee Cancel, MD;  Location: [facility];  Service: Orthopedics;  Laterality: Right;  ? JOINT REPLACEMENT Left 2013  ? left hip  ? TONSILLECTOMY    ? TOTAL KNEE ARTHROPLASTY Left 05/04/2017  ? Procedure: LEFT TOTAL KNEE ARTHROPLASTY;  Surgeon: Paralee Cancel, MD;  Location: [facility];  Service: Orthopedics;  Laterality: Left;  70 mins  ? TUBAL LIGATION    ? ?HPI:  ?Pt is an 88 y.o. female who presented with left facial droop, left weakness and slurred speech. MRI brain 3/11: Small acute infarcts scattered throughout both cerebral  hemispheres and left cerebellum, query central emboli. [facility] not completed since pt is on thickened liquids at baseline. SLP called patient's daughter Barbara Kelley and she was not aware of a time when patient was on thickened liquids. PMH: chronic HFrEF, metastatic breast cancer, hypercalcemia, stroke, chronic microcytic anemia.  ? ?Assessment / Plan / Recommendation ?Clinical Impression ? Patient presents with a mild cognitive impairment as per this evaluation which was limited by patient's ability to stay fully alert. She c/o back and neck pain and required assistance with positioning and repositioning in bed to achieve some level of comfort. Patient was oriented to month, day of week, year but not date ("its the  end of March about to be April"). She was oriented to place (hospital and correct city-[place]). She was not able to recall recent therapeutic or medical interventions when asked. She did not appear significantly confused as she was according to progress notes previous date. SLP spoke with patient's daughter via phone call after evaluation and she reported she was glad to hear patient was more oriented and aware today as yesterday she was seeing things that werent there and speech/language did not  always make sense. Patient's speech and language were appropriate and no evidence of aphasia, apraxia or dysarthria. SLP is recommending follow up ST services at next venue of care (likely ALF). ?   ?SLP Assessment ? SLP Recommendation/Assessment: All further Speech Lanaguage Pathology  needs can be addressed in the next venue of care ?SLP Visit Diagnosis: Cognitive communication deficit (R41.841)  ?  ?Recommendations for follow up therapy are one component of a multi-disciplinary discharge planning process, led by the attending physician.  Recommendations may be updated based on patient status, additional functional criteria and insurance authorization. ?   ?Follow Up Recommendations ? Other (comment) (SLP f/u at her ALF)  ?  ?Assistance Recommended at Discharge ? Frequent or constant Supervision/Assistance  ?Functional Status Assessment Patient has had a recent decline in their functional status and demonstrates the ability to make significant improvements in function in a reasonable and predictable amount of time.  ?Frequency and Duration    ?  ?  ?   ?SLP Evaluation ?Cognition ? Overall Cognitive Status: Impaired/Different from baseline ?Arousal/Alertness: Awake/alert ?Orientation Level: Oriented to person;Oriented to place;Oriented to time ?Year: 2023 ?Month: March ?Day of Week: Correct ?Attention: Sustained ?Sustained Attention: Impaired ?Sustained Attention Impairment: Verbal complex;Verbal basic (patient appears with fatigue) ?Memory: Impaired ?Memory Impairment: Decreased recall of new information;Retrieval deficit;Storage deficit ?Awareness: Impaired ?Awareness Impairment: Intellectual impairment ?Comments: Evaluation limited by patient being fatigued and focused on getting comfortable secondary to c/o back/neck pain  ?  ?   ?Comprehension ? Auditory Comprehension ?Overall Auditory Comprehension: Appears within functional limits for tasks assessed  ?  ?Expression Expression ?Primary Mode of Expression:  Verbal ?Verbal Expression ?Overall Verbal Expression: Appears within functional limits for tasks assessed ?Initiation: No impairment ?Written Expression ?Dominant Hand: Right   ?Oral / Motor ? Oral Motor/Sensory Function ?Overall Oral Motor/Sensory Function: Within functional limits ?Motor Speech ?Overall Motor Speech: Appears within functional limits for tasks assessed ?Respiration: Within functional limits ?Resonance: Within functional limits ?Articulation: Within functional limitis ?Intelligibility: Intelligible ?Motor Planning: Witnin functional limits ?Motor Speech Errors: Not applicable   ?        ? ?Sonia Baller, MA, CCC-SLP ?Speech Therapy ? ? ?

## 2021-12-14 NOTE — Assessment & Plan Note (Addendum)
-   In ED, noted to be in RVR, received 2 digoxin and as needed metoprolol ?-Continue Coreg for rate control.  Hold other cardiac medications (hospice, comfort care) ?-Patient was not on any anticoagulation outpatient.  CHA2DS2-VASc 6, neurology recommended continue Eliquis to prevent further strokes ? ? ?

## 2021-12-14 NOTE — Assessment & Plan Note (Addendum)
-   Baseline hemoglobin~8.6 ?-Anemia panel consistent with anemia of chronic disease, -hemoglobin 8.1 at the time of discharge. ? ?

## 2021-12-14 NOTE — Consult Note (Cosign Needed)
Consultation Note Date: 12/14/2021   Patient Name: Barbara Kelley  DOB: [birth year]  MRN: [id number]  Age / Sex: 88 y.o., female  PCP: Isidor Holts Referring Physician: Mendel Corning, MD  Reason for Consultation: Establishing goals of care, "Metastatic breast cancer, CT angiogram with worsening osseous metastasis, now with embolic stroke, A-fib with RVR.  Needs goals of care."  HPI/Patient Profile: 88 y.o. 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 Patient was admitted on 2/68/3419 with embolic stroke and PAF not controlled.   ED Course: Patient was found to have paroxysmal A-fib on the monitor. BP stable. Brain MRI small acute infarct scattered throughout both cerebral hemisphere and left cerebellum, concerning for central emboli.  Of note, patient was seen by [facility] outpatient Palliative care - at that time plan was for patient discharge to rehab with outpatient Palliative Care to follow and then transition to hospice services; however, after rehab patient/family have asked about initiating "palliative/hospice care" but unfortunately it looks as if it was never completed.  Clinical Assessment and Goals of Care: I have reviewed medical records including EPIC notes, labs, and imaging. Received report from primary RN - no acute concerns.   Went to visit patient at bedside - daughter/[name] and son in law/[name] present. Patient was lying in bed awake, alert, oriented, and able to participate in conversation. No signs or non-verbal gestures of pain or discomfort noted. No respiratory distress, increased work of breathing, or secretions noted.   Met with patient, [name] and  [name]  to discuss diagnosis, prognosis, GOC, EOL wishes, disposition, and options.  I introduced Palliative Medicine as specialized medical care for people living with serious illness. It focuses on providing relief from the symptoms and stress of a serious illness. The goal is to improve quality of life for both the patient and the family.  We discussed a brief life review of the patient as well as functional and nutritional status. Last year patient was living at [facility] under [place]. After her inpatient admission to [facility] in December 2022 she was discharged to rehab with outpatient Palliative Care to follow her; goal was to complete rehab and then transition to hospice care. Patient did not want to pursue cancer treatments. After rehab, patient was then placed at [facility]. Patient/family have inquired about initiating "palliative care" at facility, but this was never set up. Family describe hospice services when discussing "palliative care." Prior to hospitalization, patient describes her appetite as poor. At times she is nauseated and times she is just not hungry. Albumin noted at 2.7 on 12/14/21.  We discussed patient's current illness and what it means in the larger context of patient's on-going co-morbidities.  Patient/family have a clear understanding of patient's acute medication situation. Thy also understand that her cancer puts her at high risk  for stroke. Natural disease trajectory and expectations at EOL were discussed. I attempted to elicit values and goals of care important to the patient. The difference between aggressive medical intervention and comfort care was considered in light of the patient's goals of care.   Discussion on the difference between Palliative and Hospice care. Palliative care and hospice have similar goals of managing symptoms, promoting comfort, improving quality of life, and maintaining a person's dignity. However, palliative care may be offered  during any phase of a serious illness, while hospice care is usually offered when a person is expected to live for 6 months or less. Education provided that PT/OT would be stopped with hospice; however, did encourage continued exercise as she tolerated/desired. After discussion, patient/family are clear they were interested in hospice services, not palliative care.   Provided education and counseling at length on the philosophy and benefits of hospice care. Discussed that it offers a holistic approach to care in the setting of end-stage illness, and is about supporting the patient where they are allowing nature to take it's course. Discussed the hospice team includes RNs, physicians, social workers, and chaplains. They can provide personal care, support for the family, and help keep patient out of the hospital as well as assist with DME needs for home hospice. Education provided on the difference between home vs residential hospice.   Patient's goal is to not be rehospitalized. She is hopeful to return home as soon as possible with hospice.  Discussed with patient/family that she is likely not safe to return to ALF and will likely need LTC for frequent or constant supervision/assistance - especially anticipating she will continue to decline. Discussed having [facility] re-evaluate her and see what they can offer in terms of level of care - they are agreeable to this. Family have concerns about what this transition would mean financially - TOC can assist family in navigating.   We talked about transition to comfort measures in house and what that would entail inclusive of medications to control pain, dyspnea, agitation, nausea, and itching. We discussed stopping all unnecessary measures such as blood draws, needle sticks, oxygen, antibiotics, CBGs/insulin, cardiac monitoring, IVF, and frequent vital signs. Patient is agreeable for transition to full comfort care today in house.   Dr. Palikh/Neurology entered  room to discuss starting anticoagulation. Provided update on goal for comfort/hospice. Requested continued discussion on starting/not starting therapy.  Risk/benefit discussion had with patient/family around starting anticoagulation in context of comfort care. After detailed discussion, patient would like to start blood thinner.   Discussed with patient/family the importance of continued conversation with each other and the medical providers regarding overall plan of care and treatment options, ensuring decisions are within the context of the patients values and GOCs.    Questions and concerns were addressed. The patient/family was encouraged to call with questions and/or concerns. PMT card was provided.  Updated Dr. Reeves Forth of patent's decision to start blood thinner.   Primary Decision Maker: PATIENT, with assistance from daughter/[name] Daggett   Initiated full comfort measures Continue DNR/DNI as previously documented - durable DNR form completed and placed in shadow chart. Copy was made and will be scanned into Vynca/ACP tab Patient is hopeful to return to [facility] with hospice as soon as possible - need to see what level of care they can offer prior to discharge [facility] consulted for: Patient was in ALF but may now need LTC - unsure what [facility] can support. Family also have  concerns about what that transition would mean financially.  Added orders for EOL symptom management and to reflect full comfort measures, as well as discontinued orders that were not focused on comfort Patient is agreeable to start anticoagulation - dose per Neurology Unrestricted visitation orders were placed per current [facility] EOL visitation policy  Nursing to provide frequent assessments and administer PRN medications as clinically necessary to ensure EOL comfort PMT will continue to follow and support holistically  Code Status/Advance Care Planning: DNR  Palliative  Prophylaxis:  Aspiration, Bowel Regimen, Delirium Protocol, Frequent Pain Assessment, Oral Care, and Turn Reposition  Additional Recommendations (Limitations, Scope, Preferences): Full Comfort Care  Psycho-social/Spiritual:  Desire for further Chaplaincy support:no Created space and opportunity for patient and family to express thoughts and feelings regarding patient's current medical situation.  Emotional support and therapeutic listening provided.  Prognosis:  < 6 months  Discharge Planning: [place] with Hospice      Primary Diagnoses: Present on Admission:  Acute embolic CVA  Chronic systolic heart failure (HCC)  HLD (hyperlipidemia)  Paroxysmal A-fib (HCC) with RVR  Metastatic breast cancer ([place])   I have reviewed the medical record, interviewed the patient and family, and examined the patient. The following aspects are pertinent.  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 ; deficit of CVA in 2008   Pneumonia    x2 most recent fall of 2017    PONV (postoperative nausea and vomiting)    Stroke [facility]) 2008   deficit of neuroplathy in left hand and left foot    Social History   Socioeconomic History   Marital status: Widowed    Spouse name: Not on file   Number of children: Not on file   Years of education: Not on file   Highest education level: Not on file  Occupational History   Not on file  Tobacco Use   Smoking status: Never   Smokeless tobacco: Never  Vaping Use   Vaping Use: Never used  Substance and Sexual Activity   Alcohol use: No   Drug use: No   Sexual activity: Not on file  Other Topics Concern   Not on file  Social History Narrative   Not on file   Social  Determinants of Health   Financial Resource Strain: Not on file  Food Insecurity: Not on file  Transportation Needs: Not on file  Physical Activity: Not on file  Stress: Not on file  Social Connections: Not on file   Family History  Problem Relation Age of Onset   Diabetes Mother    Ovarian cancer Mother    Lung cancer Father    Diabetes Sister    Stroke Maternal Grandfather    Scheduled Meds:   stroke: mapping our early stages of recovery book   Does not apply Once    stroke: mapping our early stages of recovery book   Does not apply Once   aspirin  300 mg Rectal Daily   Or   aspirin  325 mg Oral Daily   heparin  5,000 Units Subcutaneous Q8H   lidocaine  1 patch Transdermal Q24H  Continuous Infusions: PRN Meds:.acetaminophen **OR** acetaminophen (TYLENOL) oral liquid 160 mg/5 mL **OR** acetaminophen, fentaNYL (SUBLIMAZE) injection, labetalol, metoprolol tartrate Medications Prior to Admission:  Prior to Admission medications   Medication Sig Start Date End Date Taking? Authorizing Provider  acetaminophen (TYLENOL) 650 MG CR tablet Take 1,300 mg by mouth in the morning and at bedtime.   Yes [provider]  alendronate (FOSAMAX) 70 MG tablet Take 70 mg by mouth every 14 (fourteen) days. Take with a full glass of water on an empty stomach.   Yes [provider]  b complex vitamins tablet Take 1 tablet by mouth daily.   Yes [provider]  bisacodyl (DULCOLAX) 10 MG suppository Place 10 mg rectally daily as needed for moderate constipation (If not relieved by MOM).   Yes [provider]  calcitonin, salmon, (MIACALCIN/FORTICAL) 200 UNIT/ACT nasal spray Place 1 spray into alternate nostrils daily.   Yes [provider]  carvedilol (COREG) 6.25 MG tablet TAKE ONE TABLET BY MOUTH TWICE A DAY WITH MEALS Patient taking differently: Take 6.25 mg by mouth 2 (two) times daily with a meal. 05/05/21  Yes Crenshaw, [name], MD  ENTRESTO 49-51 MG  TAKE ONE TABLET BY MOUTH TWICE A DAY Patient taking differently: 1 tablet 2 (two) times daily. 02/04/21  Yes Lelon Perla, MD  furosemide (LASIX) 40 MG tablet TAKE ONE TABLET BY MOUTH DAILY *TAKE ONE ADDITIONAL TABLET DAILY AS NEEDED FOR SWELLING* Patient taking differently: Take 40 mg by mouth daily. 08/04/21  Yes Lelon Perla, MD  Ginkgo Biloba 40 MG CAPS TAKE 3 CAPSULES (120MG) BY MOUTH ONCE DAILY FOR SUPPLEMENT (DO NOT CRUSH) Patient taking differently: Take 3 capsules by mouth daily. 11/01/19  Yes Medina-Vargas, Monina C, NP  methocarbamol (ROBAXIN) 500 MG tablet Take 500 mg by mouth daily as needed for muscle spasms.   Yes [provider]  Multiple Vitamins-Minerals (MULTIVITAMIN WITH MINERALS) tablet Take 1 tablet by mouth daily.   Yes [provider]  ondansetron (ZOFRAN) 4 MG tablet Take 1 tablet (4 mg total) by mouth every 6 (six) hours as needed for nausea. 10/03/21  Yes Pokhrel, Laxman, MD  oxyCODONE (OXY IR/ROXICODONE) 5 MG immediate release tablet Take 1 tablet (5 mg total) by mouth every 4 (four) hours as needed for moderate pain. Patient taking differently: Take 5 mg by mouth in the morning and at bedtime. 10/03/21 09/24/22 Yes Pokhrel, Laxman, MD  polyethylene glycol (MIRALAX / GLYCOLAX) 17 g packet Take 17 g by mouth daily. Patient taking differently: Take 17 g by mouth daily as needed for mild constipation. 10/04/21  Yes Pokhrel, Laxman, MD  psyllium (REGULOID) 0.52 g capsule Take 0.52 g by mouth daily.   Yes [provider]  sennosides-docusate sodium (SENOKOT-S) 8.6-50 MG tablet Take 1 tablet by mouth See admin instructions. Take one tablet by mouth every Mon, Wed, and Fridays per [facility]   Yes [provider]  Sodium Phosphates (RA SALINE ENEMA RE) Place 1 each rectally daily as needed (for constipation).   Yes [provider]  tamsulosin (FLOMAX) 0.4 MG CAPS capsule Take 0.4 mg by mouth in the morning and at bedtime.   Yes  [provider]  Vitamin D3 (VITAMIN D) 25 MCG tablet Take 1,000 Units by mouth daily.   Yes [provider]  zinc gluconate 50 MG tablet Take 1 tablet (50 mg total) by mouth daily. 11/01/19  Yes Medina-Vargas, Monina C, NP  feeding supplement (ENSURE ENLIVE / ENSURE PLUS) LIQD  Take 237 mLs by mouth 2 (two) times daily between meals. Patient not taking: Reported on 12/13/2021 10/03/21   Flora Lipps, MD   Allergies  Allergen Reactions   Doxycycline Nausea Only    Vertigo   Macrobid [Nitrofurantoin] Other (See Comments)    Nerve pain   Valsartan Other (See Comments) and Hypertension    Blood pressure rise   Lisinopril Cough   Calcium Channel Blockers Palpitations    Blood pressure rise / tachycardia    Penicillins Rash    Has patient had a PCN reaction causing immediate rash, facial/tongue/throat swelling, SOB or lightheadedness with hypotension: Yes Has patient had a PCN reaction causing severe rash involving mucus membranes or skin necrosis: Yes Has patient had a PCN reaction that required hospitalization: Unk Has patient had a PCN reaction occurring within the last 10 years: No If all of the above answers are "NO", then may proceed with Cephalosporin use.    Review of Systems  Constitutional:  Positive for activity change, appetite change and fatigue.  Respiratory:  Negative for shortness of breath.   Gastrointestinal:  Positive for nausea. Negative for vomiting.  Neurological:  Positive for weakness.  All other systems reviewed and are negative.  Physical Exam Vitals and nursing note reviewed.  Constitutional:      General: She is not in acute distress.    Appearance: She is cachectic. She is ill-appearing.  Pulmonary:     Effort: No respiratory distress.  Skin:    General: Skin is warm and dry.  Neurological:     Mental Status: She is alert and oriented to person, place, and time.     Motor: Weakness present.  Psychiatric:        Attention and  Perception: Attention normal.        Behavior: Behavior is cooperative.        Cognition and Memory: Cognition and memory normal.    Vital Signs: BP (!) 150/83 (BP Location: Left Arm)    Pulse (!) 102    Temp 98.5 F (36.9 C) (Oral)    Resp 20    SpO2 95%  Pain Scale: 0-10   Pain Score: 0-No pain   SpO2: SpO2: 95 % O2 Device:SpO2: 95 % O2 Flow Rate: .O2 Flow Rate (L/min): 2 L/min  IO: Intake/output summary:  Intake/Output Summary (Last 24 hours) at 12/14/2021 1450 Last data filed at 12/14/2021 0700 Gross per 24 hour  Intake 723.38 ml  Output 250 ml  Net 473.38 ml    LBM: Last BM Date : 12/13/21 Baseline Weight:   Most recent weight:       Palliative Assessment/Data: PPS 30-40%     Time In: 1500 Time Out: 1630 Time Total: 90 minutes  Greater than 50%  of this time was spent counseling and coordinating care related to the above assessment and plan.  Signed by: Lin Landsman, NP   Please contact Palliative Medicine Team phone at [phone] for questions and concerns.  For individual provider: See Shea Evans

## 2021-12-15 DIAGNOSIS — E871 Hypo-osmolality and hyponatremia: Secondary | ICD-10-CM

## 2021-12-15 DIAGNOSIS — I5022 Chronic systolic (congestive) heart failure: Secondary | ICD-10-CM | POA: Diagnosis not present

## 2021-12-15 DIAGNOSIS — E7800 Pure hypercholesterolemia, unspecified: Secondary | ICD-10-CM | POA: Diagnosis not present

## 2021-12-15 DIAGNOSIS — S2249XA Multiple fractures of ribs, unspecified side, initial encounter for closed fracture: Secondary | ICD-10-CM

## 2021-12-15 DIAGNOSIS — I6300 Cerebral infarction due to thrombosis of unspecified precerebral artery: Secondary | ICD-10-CM | POA: Diagnosis not present

## 2021-12-15 DIAGNOSIS — C50919 Malignant neoplasm of unspecified site of unspecified female breast: Secondary | ICD-10-CM | POA: Diagnosis not present

## 2021-12-15 DIAGNOSIS — I480 Paroxysmal atrial fibrillation: Secondary | ICD-10-CM | POA: Diagnosis not present

## 2021-12-15 LAB — URINE CULTURE: Culture: >=100000 — AB

## 2021-12-15 LAB — HEMOGLOBIN A1C
Hgb A1c MFr Bld: 6.3 % — ABNORMAL HIGH (ref 4.8–5.6)
Mean Plasma Glucose: 134 mg/dL

## 2021-12-15 MED ORDER — APIXABAN 5 MG PO TABS: 5.0000 mg | ORAL_TABLET | Freq: Two times a day (BID) | ORAL | 3 refills | Status: AC

## 2021-12-15 MED ORDER — OXYCODONE HCL 5 MG PO TABS: 5.0000 mg | ORAL_TABLET | ORAL | 0 refills | Status: AC | PRN

## 2021-12-15 MED ORDER — LIDOCAINE 5 % EX PTCH: 1.0000 | MEDICATED_PATCH | CUTANEOUS | 0 refills | Status: AC

## 2021-12-15 MED ORDER — POLYVINYL ALCOHOL 1.4 % OP SOLN: 1.0000 [drp] | Freq: Four times a day (QID) | OPHTHALMIC | 0 refills | Status: AC | PRN

## 2021-12-15 MED ORDER — LORAZEPAM 1 MG PO TABS: 1.0000 mg | ORAL_TABLET | Freq: Three times a day (TID) | ORAL | 0 refills | Status: AC | PRN

## 2021-12-15 NOTE — [place] FL2 (Signed)
?[place] MEDICAID FL2 LEVEL OF CARE SCREENING TOOL  ?  ? ?IDENTIFICATION  ?Patient Name: ?Barbara Kelley Birthdate: 05-24-1934 Sex: female Admission Date (Current Location): ?12/13/2021  ?[place] and [place] Number: ? [place] ?  Facility and Address:  ?[facility] [place] [address], [place], [place] [address] ?     Provider Number: ?[id number]  ?Attending Physician Name and Address:  ?Mendel Corning, MD ? Relative Name and Phone Number:  ?  ?   ?Current Level of Care: ?Hospital Recommended Level of Care: ?Assisted Living Facility Prior Approval Number: ?  ? ?Date Approved/Denied: ?  PASRR Number: ?  ? ?Discharge Plan: ?Other (Comment) (ALF) ?  ? ?Current Diagnoses: ?Patient Active Problem List  ? Diagnosis Date Noted  ? Multiple rib fractures 12/15/2021  ? Paroxysmal A-fib ([place]) with RVR 12/14/2021  ? Metastatic breast cancer ([place]) 12/14/2021  ? Normocytic anemia 12/14/2021  ? Hyponatremia 12/14/2021  ? Leukocytosis 12/14/2021  ? Acute embolic CVA 50/93/2671  ? Generalized weakness 09/26/2021  ? AKI (acute kidney injury) ([place]) 09/26/2021  ? Hypercalcemia 09/26/2021  ? Lytic bone lesions on xray 09/26/2021  ? S/P thoracentesis   ? Shortness of breath   ? Acute on chronic heart failure ([place]) 10/09/2019  ? Mitral regurgitation 08/11/2018  ? Mild CAD 08/11/2018  ? Closed fracture of right hip ([place])   ? Closed subcapital fracture of neck of femur, right, initial encounter ([place]) 10/18/2017  ? Hypertensive heart disease without CHF 10/18/2017  ? NICM (nonischemic cardiomyopathy) ([place]) 10/18/2017  ? Chronic systolic heart failure ([place]) 10/18/2017  ? HLD (hyperlipidemia) 10/18/2017  ? Near syncope 10/18/2017  ? S/P left TKA 05/04/2017  ? S/P total knee replacement 05/04/2017  ? History of CVA (cerebrovascular accident) 10/05/2006  ? ? ?Orientation RESPIRATION BLADDER Height & Weight   ?  ?Self ? Normal Incontinent Weight:   ?Height:     ?BEHAVIORAL SYMPTOMS/MOOD NEUROLOGICAL BOWEL NUTRITION STATUS  ?     Incontinent Diet (see DC summary)  ?AMBULATORY STATUS COMMUNICATION OF NEEDS Skin   ?Limited Assist Verbally Normal ?  ?  ?  ?    ?     ?     ? ? ?Personal Care Assistance Level of Assistance  ?Bathing, Feeding, Dressing Bathing Assistance: Limited assistance ?Feeding assistance: Limited assistance ?Dressing Assistance: Limited assistance ?   ? ?Functional Limitations Info  ?Sight Sight Info: Impaired ?  ?   ? ? ?SPECIAL CARE FACTORS FREQUENCY  ?    ?  ?  ?  ?  ?  ?  ?   ? ? ?Contractures Contractures Info: Not present  ? ? ?Additional Factors Info  ?Code Status, Allergies Code Status Info: DNR ?Allergies Info: Doxycycline, Macrobid (Nitrofurantoin), Valsartan, Lisinopril, Calcium Channel Blockers, Penicillins ?  ?  ?  ?   ? ?Current Medications (12/15/2021):  This is the current hospital active medication list ?Current Facility-Administered Medications  ?Medication Dose Route Frequency Provider Last Rate Last Admin  ? acetaminophen (TYLENOL) tablet 650 mg  650 mg Oral Q4H PRN Lequita Halt, MD   650 mg at 12/14/21 1902  ? Or  ? acetaminophen (TYLENOL) 160 MG/5ML solution 650 mg  650 mg Per Tube Q4H PRN Wynetta Fines T, MD      ? Or  ? acetaminophen (TYLENOL) suppository 650 mg  650 mg Rectal Q4H PRN Lequita Halt, MD   650 mg at 12/14/21 0500  ? antiseptic oral rinse (BIOTENE) solution 15 mL  15 mL Topical PRN Lin Landsman, NP      ? apixaban (ELIQUIS) tablet 5 mg  5 mg Oral BID Dorene Grebe, MD   5 mg at 12/15/21 1011  ? fentaNYL (SUBLIMAZE) injection 25-50 mcg  25-50 mcg Intravenous Q2H PRN Lin Landsman, NP      ? glycopyrrolate (ROBINUL) tablet 1 mg  1 mg Oral Q4H PRN Lin Landsman, NP      ? Or  ? glycopyrrolate (ROBINUL) injection 0.2 mg  0.2 mg Subcutaneous Q4H PRN Lin Landsman, NP      ? Or  ? glycopyrrolate (ROBINUL) injection 0.2 mg  0.2 mg Intravenous Q4H PRN Lin Landsman, NP      ? haloperidol (HALDOL) tablet 2 mg  2 mg Oral Q6H PRN Lin Landsman, NP      ? Or  ? haloperidol (HALDOL) 2  MG/ML solution 2 mg  2 mg Sublingual Q6H PRN Lin Landsman, NP      ? Or  ? haloperidol lactate (HALDOL) injection 2 mg  2 mg Intravenous Q6H PRN Lin Landsman, NP      ? lidocaine (LIDODERM) 5 % 1 patch  1 patch Transdermal Q24H Rai, Ripudeep K, MD   1 patch at 12/14/21 1442  ? LORazepam (ATIVAN) tablet 1 mg  1 mg Oral Q1H PRN Lin Landsman, NP      ? Or  ? LORazepam (ATIVAN) 2 MG/ML concentrated solution 1 mg  1 mg Sublingual Q1H PRN Lin Landsman, NP      ? Or  ? LORazepam (ATIVAN) injection 1 mg  1 mg Intravenous Q1H PRN Lin Landsman, NP      ? ondansetron (ZOFRAN-ODT) disintegrating tablet 4 mg  4 mg Oral Q6H PRN Lin Landsman, NP      ? Or  ? ondansetron (ZOFRAN) injection 4 mg  4 mg Intravenous Q6H PRN Lin Landsman, NP      ? polyvinyl alcohol (LIQUIFILM TEARS) 1.4 % ophthalmic solution 1 drop  1 drop Both Eyes QID PRN Lin Landsman, NP      ? ? ? ?Discharge Medications: ?STOP taking these medications   ?  ?alendronate 70 MG tablet ?Commonly known as: FOSAMAX ?   ?b complex vitamins tablet ?   ?calcitonin (salmon) 200 UNIT/ACT nasal spray ?Commonly known as: MIACALCIN/FORTICAL ?   ?Entresto 49-51 MG ?Generic drug: sacubitril-valsartan ?   ?feeding supplement Liqd ?   ?furosemide 40 MG tablet ?Commonly known as: LASIX ?   ?Ginkgo Biloba 40 MG Caps ?   ?methocarbamol 500 MG tablet ?Commonly known as: ROBAXIN ?   ?multivitamin with minerals tablet ?   ?Vitamin D3 25 MCG tablet ?Commonly known as: Vitamin D ?   ?zinc gluconate 50 MG tablet ?   ?  ?   ?  ?TAKE these medications   ?  ?acetaminophen 650 MG CR tablet ?Commonly known as: TYLENOL ?Take 1,300 mg by mouth in the morning and at bedtime. ?   ?apixaban 5 MG Tabs tablet ?Commonly known as: ELIQUIS ?Take 1 tablet (5 mg total) by mouth 2 (two) times daily. ?   ?bisacodyl 10 MG suppository ?Commonly known as: DULCOLAX ?Place 10 mg rectally daily as needed for moderate constipation (If not relieved by MOM). ?   ?carvedilol 6.25 MG tablet ?Commonly  known as: COREG ?TAKE ONE TABLET BY MOUTH TWICE A DAY WITH MEALS ?   ?lidocaine 5 % ?Commonly known as:  LIDODERM ?Place 1 patch onto the skin daily. Remove & Discard patch within 12 hours or as directed by MDApply to left rib area ?   ?LORazepam 1 MG tablet ?Commonly known as: ATIVAN ?Take 1 tablet (1 mg total) by mouth every 8 (eight) hours as needed for anxiety or seizure (distress). ?   ?ondansetron 4 MG tablet ?Commonly known as: ZOFRAN ?Take 1 tablet (4 mg total) by mouth every 6 (six) hours as needed for nausea. ?   ?oxyCODONE 5 MG immediate release tablet ?Commonly known as: Oxy IR/ROXICODONE ?Take 1 tablet (5 mg total) by mouth every 4 (four) hours as needed for moderate pain, severe pain or breakthrough pain. ?What changed: reasons to take this ?   ?polyethylene glycol 17 g packet ?Commonly known as: MIRALAX / GLYCOLAX ?Take 17 g by mouth daily. ?What changed:  ?when to take this ?reasons to take this ?   ?polyvinyl alcohol 1.4 % ophthalmic solution ?Commonly known as: LIQUIFILM TEARS ?Place 1 drop into both eyes 4 (four) times daily as needed for dry eyes. ?   ?psyllium 0.52 g capsule ?Commonly known as: REGULOID ?Take 0.52 g by mouth daily. ?   ?RA SALINE ENEMA RE ?Place 1 each rectally daily as needed (for constipation). ?   ?sennosides-docusate sodium 8.6-50 MG tablet ?Commonly known as: SENOKOT-S ?Take 1 tablet by mouth See admin instructions. Take one tablet by mouth every Mon, Wed, and Fridays per [facility] ?   ?tamsulosin 0.4 MG Caps capsule ?Commonly known as: FLOMAX ?Take 0.4 mg by mouth in the morning and at bedtime.  ? ? ?Relevant Imaging Results: ? ?Relevant Lab Results: ? ? ?Additional Information ?SS#: [id number] ? ?Geralynn Ochs, LCSW ? ? ? ? ?

## 2021-12-15 NOTE — Progress Notes (Signed)
Report given to [facility] from [place] who knows her very well, awaiting PTAR  for transport. Pt made aware. ?

## 2021-12-15 NOTE — Progress Notes (Incomplete)
Daily Progress Note   Patient Name: Barbara Kelley       Date: 12/15/2021 DOB: 05-21-1934  Age: 88 y.o. MRN#: [id number] Attending Physician: Mendel Corning, MD Primary Care Physician: [facility] Admit Date: 12/13/2021  Reason for Consultation/Follow-up: {Reason for Consult:23484}  Subjective: ***  Length of Stay: 2  Current Medications: Scheduled Meds:   apixaban  5 mg Oral BID   lidocaine  1 patch Transdermal Q24H    Continuous Infusions:   PRN Meds: acetaminophen **OR** acetaminophen (TYLENOL) oral liquid 160 mg/5 mL **OR** acetaminophen, antiseptic oral rinse, fentaNYL (SUBLIMAZE) injection, glycopyrrolate **OR** glycopyrrolate **OR** glycopyrrolate, haloperidol **OR** haloperidol **OR** haloperidol lactate, LORazepam **OR** LORazepam **OR** LORazepam, ondansetron **OR** ondansetron (ZOFRAN) IV, polyvinyl alcohol  Physical Exam          Vital Signs: BP (!) 156/86 (BP Location: Left Arm)    Pulse 94    Temp 98 F (36.7 C) (Oral)    Resp 16    SpO2 95%  SpO2: SpO2: 95 % O2 Device: O2 Device: Room Air O2 Flow Rate: O2 Flow Rate (L/min): 2 L/min  Intake/output summary:  Intake/Output Summary (Last 24 hours) at 12/15/2021 1722 Last data filed at 12/15/2021 0800 Gross per 24 hour  Intake 210 ml  Output 150 ml  Net 60 ml   LBM: Last BM Date : 12/14/21 Baseline Weight:   Most recent weight:         Palliative Assessment/Data:    Flowsheet Rows    Flowsheet Row Most Recent Value  Intake Tab   Referral Department Hospitalist  [discussed case with EDP prior to admit and recommended consult]  Unit at Time of Referral ER  Palliative Care Primary Diagnosis Cancer  Date Notified 12/13/21  Palliative Care Type Return patient Palliative Care  Reason  for referral Clarify Goals of Care  Date of Admission 12/13/21  Date first seen by Palliative Care 12/14/21  # of days Palliative referral response time 1 Day(s)  # of days IP prior to Palliative referral 0  Clinical Assessment   Psychosocial & Spiritual Assessment   Palliative Care Outcomes   Patient/Family meeting held? Yes  Who was at the meeting? patient, daughter, son in law  Palliative Care Outcomes Improved pain interventions, Improved non-pain symptom therapy, Clarified goals of care, Counseled regarding hospice, Provided  end of life care assistance, Provided psychosocial or spiritual support, Changed to focus on comfort, Completed durable DNR, Transitioned to hospice  Patient/Family wishes: Interventions discontinued/not started  Mechanical Ventilation, Antibiotics, Tube feedings/TPN, BiPAP, Hemodialysis, NIPPV, Transfusion, Trach, PEG, Vasopressors, Transfer out of ICU       Patient Active Problem List   Diagnosis Date Noted   Multiple rib fractures 12/15/2021   Paroxysmal A-fib (HCC) with RVR 12/14/2021   Metastatic breast cancer ([place]) 12/14/2021   Normocytic anemia 12/14/2021   Hyponatremia 12/14/2021   Leukocytosis 23/53/6144   Acute embolic CVA 31/54/0086   Generalized weakness 09/26/2021   AKI (acute kidney injury) ([place]) 09/26/2021   Hypercalcemia 09/26/2021   Lytic bone lesions on xray 09/26/2021   S/P thoracentesis    Shortness of breath    Acute on chronic heart failure ([place]) 10/09/2019   Mitral regurgitation 08/11/2018   Mild CAD 08/11/2018   Closed fracture of right hip ([place])    Closed subcapital fracture of neck of femur, right, initial encounter ([place]) 10/18/2017   Hypertensive heart disease without CHF 10/18/2017   NICM (nonischemic cardiomyopathy) ([place]) 76/19/5093   Chronic systolic heart failure ([place]) 10/18/2017   HLD (hyperlipidemia) 10/18/2017   Near syncope 10/18/2017   S/P left TKA 05/04/2017   S/P total knee replacement 05/04/2017   History of  CVA (cerebrovascular accident) 10/05/2006    Palliative Care Assessment & Plan   Patient Profile: ***  Assessment: ***  Recommendations/Plan: ***  Goals of Care and Additional Recommendations: Limitations on Scope of Treatment: {Recommended Scope and Preferences:21019}  Code Status:    Code Status Orders  (From admission, onward)           Start     Ordered   12/14/21 1618  Do not attempt resuscitation (DNR)  Continuous       Question Answer Comment  In the event of cardiac or respiratory ARREST Do not call a code blue   In the event of cardiac or respiratory ARREST Do not perform Intubation, CPR, defibrillation or ACLS   In the event of cardiac or respiratory ARREST Use medication by any route, position, wound care, and other measures to relive pain and suffering. May use oxygen, suction and manual treatment of airway obstruction as needed for comfort.      12/14/21 1625           Code Status History     Date Active Date Inactive Code Status Order ID Comments User Context   12/13/2021 1744 12/14/2021 1625 DNR [id number]  Lequita Halt, MD ED   09/29/2021 1358 10/03/2021 1738 DNR [id number]  Jimmy Footman, NP Inpatient   09/26/2021 1920 09/29/2021 1358 Full Code [id number]  Doran Heater, DO ED   10/09/2019 1221 10/16/2019 2220 Full Code [id number]  Kathlen Mody, Cadence H, PA-C ED   10/18/2017 1303 10/21/2017 1913 Full Code [id number]  Samella Parr, NP ED   05/04/2017 1446 05/06/2017 1818 Full Code [id number]  Norman Herrlich Inpatient      Advance Directive Documentation    Flowsheet Row Most Recent Value  Type of Advance Directive Healthcare Power of Attorney, Living will  Pre-existing out of facility DNR order (yellow form or pink MOST form) --  "MOST" Form in Place? --       Prognosis:  {Palliative Care Prognosis:23504}  Discharge Planning: {Palliative dispostion:23505}  Care plan was discussed with ***  Thank you for allowing the  Palliative Medicine Team to assist in the care of this patient.  Time In: *** Time Out: *** Total Time *** Prolonged Time Billed  {YES NO:22349}       Greater than 50%  of this time was spent counseling and coordinating care related to the above assessment and plan.  Lavena Bullion, NP  Please contact Palliative Medicine Team phone at [phone] for questions and concerns.

## 2021-12-15 NOTE — Discharge Summary (Signed)
Physician Discharge Summary   Patient: Barbara Kelley MRN: [id number] DOB: 08/06/1934  Admit date:     12/13/2021  Discharge date: 12/15/21  Discharge Physician: Estill Cotta, MD   PCP: Haze Boyden [facility]   Recommendations at discharge:   Continue Eliquis 5 mg p.o. twice daily DNR, comfort care  Discharge Diagnoses:    Acute embolic CVA   Paroxysmal A-fib ([place]) with RVR   Chronic systolic heart failure ([place])   HLD (hyperlipidemia)   Normocytic anemia   Hyponatremia   Metastatic breast cancer ([place])   Leukocytosis Left rib fractures, multiple  Hospital Course: Patient is a 88 year old female with history of chronic HFrEF, metastatic breast CA, hypercalcemia, prior stroke, chronic anemia presented with left facial droop, left-sided weakness and slurred speech.  Per patient, noticed her symptoms prior to going to bed the night before. MRI brain showed multiple scattered acute infarcts.  Neurology consulted.   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 -MRI of the brain with multiple scattered acute infarcts likely due to hypercoagulability, paroxysmal atrial fibrillation with RVR and history of metastatic breast CA.  - Neurology consulted.   -CTA head and neck with intracranial atherosclerosis without a large vessel occlusion, moderate right and severe left P2 stenosis, severe right V1 and V4 stenosis.  Widespread osseous metastasis -2D echo showed EF 40 to 45%, indeterminate diastolic parameters, moderately elevated pulmonary artery systolic pressure. -Initially placed on aspirin, neurology recommended anticoagulation given embolic stroke.  Palliative medicine was consulted, overall poor prognosis with metastatic breast CA and progression of the disease, family requested to continue anticoagulation at the recommendation of neurology to  prevent further strokes.   Paroxysmal A-fib (HCC) with RVR - In ED, noted to be in RVR, received 2 digoxin and as needed metoprolol -Continue Coreg for rate control.  Hold other cardiac medications (hospice, comfort care) -Patient was not on any anticoagulation outpatient.  CHA2DS2-VASc 6, neurology recommended continue Eliquis to prevent further strokes    Multiple rib fractures - Patient complained of left-sided rib pain, rib x-rays showed suspected new fractures of left anterior sixth, seventh and eighth ribs with adjacent pleural thickening.  Multiple late subacute fractures also present.  Patient has underlying widespread bony lucencies. -Continue pain control and added Lidoderm patch.  Continue incentive spirometry.  Hyponatremia - Resolved, sodium 132 at the time of admission, improved to 137 at the time of discharge. -Continue to hold Lasix.  Encourage p.o. diet.  Normocytic anemia - Baseline hemoglobin~8.6 -Anemia panel consistent with anemia of chronic disease, -hemoglobin 8.1 at the time of discharge.   HLD (hyperlipidemia) - HDL 35, LDL 67, triglycerides 153, not on any statins outpatient  Chronic systolic heart failure (HCC) - 2D echo showed EF of 40 to 45%, indeterminate diastolic parameters, moderately elevated pulmonary artery systolic pressure -Continue Coreg for rate control for paroxysmal A-fib.  Hold Entresto, Lasix, currently comfort care  Leukocytosis - Unclear etiology, improving possibly stress demargination  -Urine culture showed more than 100,000 colonies of multiple species, no symptoms of UTI.  No fevers  Chest x-ray showed vascular congestion, no pneumonia  Metastatic breast cancer ([place]) - CTA head and neck also shows widespread osseous metastatic disease, stage IV -Diagnosed in December 2022, was recommended palliative care. -Given embolic stroke, atrial fibrillation, resultant dysphagia, progression of metastatic disease, palliative medicine was  consulted.  Goals of care meeting was done and  family requested hospice and comfort care.        Pain control - [employer] Controlled Substance Reporting System database was reviewed. and patient was instructed, not to drive, operate heavy machinery, perform activities at heights, swimming or participation in water activities or provide baby-sitting services while on Pain, Sleep and Anxiety Medications; until their outpatient Physician has advised to do so again. Also recommended to not to take more than prescribed Pain, Sleep and Anxiety Medications.  Consultants: Neurology, palliative medicine Procedures performed: None Disposition: Assisted living Diet recommendation:  Cardiac diet DISCHARGE MEDICATION: Allergies as of 12/15/2021       Reactions   Doxycycline Nausea Only   Vertigo   Macrobid [nitrofurantoin] Other (See Comments)   Nerve pain   Valsartan Other (See Comments), Hypertension   Blood pressure rise   Lisinopril Cough   Calcium Channel Blockers Palpitations   Blood pressure rise / tachycardia    Penicillins Rash   Has patient had a PCN reaction causing immediate rash, facial/tongue/throat swelling, SOB or lightheadedness with hypotension: Yes Has patient had a PCN reaction causing severe rash involving mucus membranes or skin necrosis: Yes Has patient had a PCN reaction that required hospitalization: Unk Has patient had a PCN reaction occurring within the last 10 years: No If all of the above answers are "NO", then may proceed with Cephalosporin use.        Medication List     STOP taking these medications    alendronate 70 MG tablet Commonly known as: FOSAMAX   b complex vitamins tablet   calcitonin (salmon) 200 UNIT/ACT nasal spray Commonly known as: MIACALCIN/FORTICAL   Entresto 49-51 MG Generic drug: sacubitril-valsartan   feeding supplement Liqd   furosemide 40 MG tablet Commonly known as: LASIX   Ginkgo Biloba 40 MG Caps   methocarbamol  500 MG tablet Commonly known as: ROBAXIN   multivitamin with minerals tablet   Vitamin D3 25 MCG tablet Commonly known as: Vitamin D   zinc gluconate 50 MG tablet       TAKE these medications    acetaminophen 650 MG CR tablet Commonly known as: TYLENOL Take 1,300 mg by mouth in the morning and at bedtime.   apixaban 5 MG Tabs tablet Commonly known as: ELIQUIS Take 1 tablet (5 mg total) by mouth 2 (two) times daily.   bisacodyl 10 MG suppository Commonly known as: DULCOLAX Place 10 mg rectally daily as needed for moderate constipation (If not relieved by MOM).   carvedilol 6.25 MG tablet Commonly known as: COREG TAKE ONE TABLET BY MOUTH TWICE A DAY WITH MEALS   lidocaine 5 % Commonly known as: LIDODERM Place 1 patch onto the skin daily. Remove & Discard patch within 12 hours or as directed by MDApply to left rib area   LORazepam 1 MG tablet Commonly known as: ATIVAN Take 1 tablet (1 mg total) by mouth every 8 (eight) hours as needed for anxiety or seizure (distress).   ondansetron 4 MG tablet Commonly known as: ZOFRAN Take 1 tablet (4 mg total) by mouth every 6 (six) hours as needed for nausea.   oxyCODONE 5 MG immediate release tablet Commonly known as: Oxy IR/ROXICODONE Take 1 tablet (5 mg total) by mouth every 4 (four) hours as needed for moderate pain, severe pain or breakthrough pain. What changed: reasons to take this   polyethylene glycol 17 g packet Commonly known as: MIRALAX / GLYCOLAX Take 17 g by mouth daily. What changed:  when to take this  reasons to take this   polyvinyl alcohol 1.4 % ophthalmic solution Commonly known as: LIQUIFILM TEARS Place 1 drop into both eyes 4 (four) times daily as needed for dry eyes.   psyllium 0.52 g capsule Commonly known as: REGULOID Take 0.52 g by mouth daily.   RA SALINE ENEMA RE Place 1 each rectally daily as needed (for constipation).   sennosides-docusate sodium 8.6-50 MG tablet Commonly known as:  SENOKOT-S Take 1 tablet by mouth See admin instructions. Take one tablet by mouth every Mon, Wed, and Fridays per [facility]   tamsulosin 0.4 MG Caps capsule Commonly known as: FLOMAX Take 0.4 mg by mouth in the morning and at bedtime.        Follow-up Information     [place], [place]. Schedule an appointment as soon as possible for a visit in 1 week(s).   Specialty: [place] Why: for hospital follow-up Contact information: [place] [address] [phone]         Lelon Perla, MD .   Specialty: Cardiology Contact information: [address] [place] [address] [phone]                Discharge Exam: S: No acute issues overnight.  Heart rate controlled.  Vitals:   12/14/21 1636 12/14/21 1939 12/14/21 2351 12/15/21 0700  BP:  138/78 (!) 147/75 (!) 156/86  Pulse: 94 94    Resp:  _0 Temp:  98 F (36.7 C) 98.5 F (36.9 C) 98 F (36.7 C)  TempSrc:  Oral Oral Oral  SpO2: 95% 94% 95% 95%     Physical Exam General: Alert and oriented x 3, NAD Cardiovascular: S1 S2 clear, RRR Respiratory: CTAB, no wheezing, rales or rhonchi Gastrointestinal: Soft, nontender, nondistended, NBS Ext: no pedal edema bilaterally Neuro: left-sided strength 4/5, RUE and RLE 5/5 Psych: Normal affect and demeanor, alert and oriented x3   Condition at discharge: fair  The results of significant diagnostics from this hospitalization (including imaging, microbiology, ancillary and laboratory) are listed below for reference.   Imaging Studies: CT ANGIO HEAD NECK W WO CM  Result Date: 12/13/2021 CLINICAL DATA:  Stroke/TIA, determine embolic source. Slurred speech and weakness. Small acute cerebral and cerebellar infarcts on MRI. EXAM: CT ANGIOGRAPHY HEAD AND NECK TECHNIQUE: Multidetector CT imaging of the head and neck was performed using the standard protocol during bolus administration of intravenous  contrast. Multiplanar CT image reconstructions and MIPs were obtained to evaluate the vascular anatomy. Carotid stenosis measurements (when applicable) are obtained utilizing NASCET criteria, using the distal internal carotid diameter as the denominator. RADIATION DOSE REDUCTION: This exam was performed according to the departmental dose-optimization program which includes automated exposure control, adjustment of the mA and/or kV according to patient size and/or use of iterative reconstruction technique. CONTRAST:  28m OMNIPAQUE IOHEXOL 350 MG/ML SOLN COMPARISON:  Head MRI 12/13/2021 FINDINGS: CT HEAD FINDINGS Brain: The small acute cerebral and cerebellar infarcts on MRI are overall not well shown on this mildly motion degraded CT. No acute intracranial hemorrhage, mass, midline shift, or extra-axial fluid collection is identified. There is a chronic infarct anteriorly in the left basal ganglia with ex vacuo dilatation of the frontal horn of the left lateral ventricle. There is a background of moderate to severe chronic small vessel ischemia in the cerebral white matter, and small chronic infarcts are again noted in the thalami and cerebellum. There is mild-to-moderate cerebral atrophy. Vascular: Reported below. Skull: No  fracture. Sinuses: The visualized paranasal sinuses and mastoid air cells are clear. Orbits: Unremarkable. Review of the MIP images confirms the above findings CTA NECK FINDINGS Aortic arch: Incomplete imaging of the aortic arch. Common origin of the brachiocephalic and left common carotid arteries, a normal variant. Mild atherosclerosis in the included portion of the arch. Patent brachiocephalic and subclavian arteries without significant stenosis. Right carotid system: Patent with a small amount of calcified and soft plaque at the carotid bifurcation. No evidence of a significant stenosis or dissection. Left carotid system: Patent with a small to moderate amount of predominantly calcified  plaque at the carotid bifurcation. No evidence of a significant stenosis or dissection. Vertebral arteries: Patent without evidence of a dissection or a significant stenosis of the dominant left vertebral artery. Severe stenosis of the right vertebral origin. Skeleton: Widespread lytic lesions throughout the cervical and included upper thoracic spine as well as ribs, sternum, right scapula, right mandibular condyle, and skull base. Other neck: No evidence of cervical lymphadenopathy or mass. Upper chest: Small bilateral pleural effusions, right larger than left. Review of the MIP images confirms the above findings CTA HEAD FINDINGS Anterior circulation: The internal carotid arteries are patent from skull base to carotid termini with mild atherosclerotic plaque bilaterally not resulting in significant stenosis. ACAs and MCAs are patent with widespread atherosclerotic irregularity but no evidence of a proximal branch occlusion or high-grade proximal stenosis. There is a mild-to-moderate left M1 stenosis. No aneurysm is identified. Posterior circulation: There is atherosclerotic irregularity of the left V4 segment without a flow limiting stenosis. There are severe stenoses of the right V4 segment at the PICA origin as well as at the vertebrobasilar junction. Patent PICA and SCA origins are seen bilaterally. The basilar artery is patent with moderate diffuse irregularity but no significant stenosis. There are robust posterior communicating arteries bilaterally with absent right and hypoplastic left P1 segments. There are moderate right and severe left P2 stenoses. No aneurysm is identified Venous sinuses: Patent. Anatomic variants: Fetal right PCA. Review of the MIP images confirms the above findings IMPRESSION: 1. Intracranial atherosclerosis without a large vessel occlusion. 2. Moderate right and severe left P2 stenoses. 3. Severe right V1 and V4 stenoses. 4. Cervical carotid atherosclerosis without a significant  stenosis. 5. Widespread osseous metastases. 6. Small bilateral pleural effusions. 7. Aortic Atherosclerosis (ICD10-I70.0). Electronically Signed   By: Logan Bores M.D.   On: 12/13/2021 17:39   DG Ribs Unilateral Left  Result Date: 12/14/2021 CLINICAL DATA:  Left rib pain along the left axilla EXAM: LEFT RIBS - 2 VIEW COMPARISON:  CT chest 09/26/2021 FINDINGS: Numerous rib deformities are present on the left including irregularities of the second, third, fourth, sixth, seventh, eighth, and tenth ribs. Some of these were already present on 09/26/2021 although the fractures of the left anterolateral sixth, seventh, and eighth ribs are probably acute or subacute. There is potentially some mild pleural thickening at the left lung base best appreciated on the oblique view. The patient has bony findings on prior imaging highly suggestive of widespread multiple myeloma or other lytic bony lesions. Compression fractures are again observed at T11 and T12. Multilevel thoracic spondylosis. Atherosclerotic calcification of the aortic arch. IMPRESSION: 1. Suspected new fractures of the left anterior sixth, seventh, and eighth ribs with adjacent pleural thickening. Multiple late subacute fractures are also present and were shown on the CT chest of 09/26/2021. The patient has underlying widespread bony lucencies which could be from myeloma or other lytic metastatic process. 2.  Compression fractures at T11 and T12 are again observed. 3.  Aortic Atherosclerosis (ICD10-I70.0). Electronically Signed   By: Van Clines M.D.   On: 12/14/2021 14:12   MR Brain W and Wo Contrast  Addendum Date: 12/13/2021   ADDENDUM REPORT: 12/13/2021 17:41 ADDENDUM: History of metastatic breast cancer with bone lesions involving the skull base, right mandibular condyle, and included upper cervical spine, not well evaluated due to motion. Electronically Signed   By: Logan Bores M.D.   On: 12/13/2021 17:41   Result Date: 12/13/2021 CLINICAL  DATA:  Neuro deficit, acute, stroke suspected. Visual field deficit, hx of cancer, concern for metastasis or stroke. EXAM: MRI HEAD WITHOUT AND WITH CONTRAST TECHNIQUE: Multiplanar, multiecho pulse sequences of the brain and surrounding structures were obtained without and with intravenous contrast. CONTRAST:  83mL GADAVIST GADOBUTROL 1 MMOL/ML IV SOLN COMPARISON:  Head CT 09/26/2021 FINDINGS: The study is moderately motion degraded. Brain: There are small, acute, predominantly cortically based infarcts scattered throughout both cerebral hemispheres involving bilateral MCA, bilateral PCA, and right ACA territories. There are also small acute superior left cerebellar infarcts. A few scattered chronic cerebral microhemorrhages are noted. There is a chronic infarct anteriorly in the left basal ganglia with associated chronic blood products and ex vacuo dilatation of the frontal horn of the left lateral ventricle. Patchy to confluent T2 hyperintensities in the cerebral white matter bilaterally are nonspecific but compatible with moderate to severe chronic small vessel ischemic disease. Milder chronic small vessel changes are present in the pons. Small chronic infarcts are present in the thalami and cerebellum. Numerous small T2 hyperintensities throughout the basal ganglia bilaterally are compatible with dilated perivascular spaces, possibly with some interspersed chronic lacunar infarcts. There is mild-to-moderate cerebral atrophy. No abnormal enhancement is identified. Vascular: Major intracranial vascular flow voids are preserved. Skull and upper cervical spine: Unremarkable bone marrow signal para Sinuses/Orbits: Small mucous retention cyst in the left maxillary sinus. Clear mastoid air cells. Other: None. IMPRESSION: 1. Motion degraded examination. 2. Small acute infarcts scattered throughout both cerebral hemispheres and left cerebellum, query central emboli. 3. Moderate to severe chronic small vessel ischemic  disease. Electronically Signed: By: Logan Bores M.D. On: 12/13/2021 15:22   DG Chest Port 1 View  Result Date: 12/13/2021 CLINICAL DATA:  Facial droop, slurred speech EXAM: PORTABLE CHEST 1 VIEW COMPARISON:  08/08/2021 FINDINGS: Cardiomegaly. Vascular congestion and aortic atherosclerosis. No overt edema. No effusions or acute bony abnormality. IMPRESSION: Cardiomegaly, vascular congestion. Electronically Signed   By: Rolm Baptise M.D.   On: 12/13/2021 19:00   ECHOCARDIOGRAM COMPLETE  Result Date: 12/14/2021    ECHOCARDIOGRAM REPORT   Patient Name:   AUDREANNA TORRISI Date of Exam: 12/14/2021 Medical Rec #:  [id number]      Height:       58.0 in Accession #:    [id number]     Weight:       115.0 lb Date of Birth:  1934/06/14      BSA:          1.439 m Patient Age:    88 years       BP:           120/62 mmHg Patient Gender: F              HR:           102 bpm. Exam Location:  Inpatient Procedure: 2D Echo Indications:    stroke  History:        Patient  has prior history of Echocardiogram examinations, most                 recent 02/27/2020. Arrythmias:Atrial Fibrillation.  Sonographer:    Johny Chess RDCS Referring Phys: [place]  1. Apical windows are foreshortened. HYpokinesis of the inferior wall, the basal inferoseptalm the basal inferolateral wall. Compared to previous echo from 2021 LVEF appears very mildly improved . Left ventricular ejection fraction, by estimation, is 40  to 45%. The left ventricle has mildly decreased function. Left ventricular diastolic parameters are indeterminate.  2. Right ventricular systolic function is low normal. The right ventricular size is normal. There is moderately elevated pulmonary artery systolic pressure.  3. Left atrial size was mild to moderately dilated.  4. Right atrial size was mildly dilated.  5. Mild mitral valve regurgitation.  6. The aortic valve is tricuspid. Aortic valve regurgitation is not visualized. Aortic valve  sclerosis/calcification is present, without any evidence of aortic stenosis.  7. The inferior vena cava is normal in size with greater than 50% respiratory variability, suggesting right atrial pressure of 3 mmHg. FINDINGS  Left Ventricle: Apical windows are foreshortened. HYpokinesis of the inferior wall, the basal inferoseptalm the basal inferolateral wall. Compared to previous echo from 2021 LVEF appears very mildly improved. Left ventricular ejection fraction, by estimation, is 40 to 45%. The left ventricle has mildly decreased function. The left ventricular internal cavity size was normal in size. There is no left ventricular hypertrophy. Left ventricular diastolic parameters are indeterminate. Right Ventricle: The right ventricular size is normal. Right vetricular wall thickness was not assessed. Right ventricular systolic function is low normal. There is moderately elevated pulmonary artery systolic pressure. The tricuspid regurgitant velocity is 3.60 m/s, and with an assumed right atrial pressure of 3 mmHg, the estimated right ventricular systolic pressure is 60.4 mmHg. Left Atrium: Left atrial size was mild to moderately dilated. Right Atrium: Right atrial size was mildly dilated. Pericardium: Trivial pericardial effusion is present. Mitral Valve: There is mild thickening of the mitral valve leaflet(s). There is mild calcification of the mitral valve leaflet(s). Mild mitral valve regurgitation. Tricuspid Valve: The tricuspid valve is normal in structure. Tricuspid valve regurgitation is mild. Aortic Valve: The aortic valve is tricuspid. Aortic valve regurgitation is not visualized. Aortic valve sclerosis/calcification is present, without any evidence of aortic stenosis. Pulmonic Valve: The pulmonic valve was not well visualized. Pulmonic valve regurgitation is not visualized. Aorta: The aortic root is normal in size and structure. Venous: The inferior vena cava is normal in size with greater than 50%  respiratory variability, suggesting right atrial pressure of 3 mmHg. IAS/Shunts: No atrial level shunt detected by color flow Doppler.  LEFT VENTRICLE PLAX 2D LVIDd:         4.00 cm LVIDs:         3.00 cm LV PW:         1.10 cm LV IVS:        1.00 cm LVOT diam:     2.00 cm LV SV:         55 LV SV Index:   38 LVOT Area:     3.14 cm  IVC IVC diam: 1.70 cm LEFT ATRIUM             Index        RIGHT ATRIUM           Index LA diam:        3.30 cm 2.29 cm/m  RA Area:     19.30 cm LA Vol (A2C):   49.4 ml 34.32 ml/m  RA Volume:   49.90 ml  34.67 ml/m LA Vol (A4C):   70.5 ml 48.98 ml/m LA Biplane Vol: 59.9 ml 41.61 ml/m  AORTIC VALVE LVOT Vmax:   96.10 cm/s LVOT Vmean:  64.900 cm/s LVOT VTI:    0.175 m  AORTA Ao Root diam: 3.20 cm TRICUSPID VALVE TR Peak grad:   51.8 mmHg TR Vmax:        360.00 cm/s  SHUNTS Systemic VTI:  0.18 m Systemic Diam: 2.00 cm Dorris Carnes MD Electronically signed by Dorris Carnes MD Signature Date/Time: 12/14/2021/1:21:03 PM    Final     Microbiology: Results for orders placed or performed during the hospital encounter of 12/13/21  Resp Panel by RT-PCR (Flu A&B, Covid) Nasopharyngeal Swab     Status: None   Collection Time: 12/13/21 12:56 PM   Specimen: Nasopharyngeal Swab; Nasopharyngeal(NP) swabs in vial transport medium  Result Value Ref Range Status   SARS Coronavirus 2 by RT PCR NEGATIVE NEGATIVE Final    Comment: (NOTE) SARS-CoV-2 target nucleic acids are NOT DETECTED.  The SARS-CoV-2 RNA is generally detectable in upper respiratory specimens during the acute phase of infection. The lowest concentration of SARS-CoV-2 viral copies this assay can detect is 138 copies/mL. A negative result does not preclude SARS-Cov-2 infection and should not be used as the sole basis for treatment or other patient management decisions. A negative result may occur with  improper specimen collection/handling, submission of specimen other than nasopharyngeal swab, presence of viral mutation(s)  within the areas targeted by this assay, and inadequate number of viral copies(<138 copies/mL). A negative result must be combined with clinical observations, patient history, and epidemiological information. The expected result is Negative.  Fact Sheet for Patients:  EntrepreneurPulse.com.au  Fact Sheet for Healthcare Providers:  IncredibleEmployment.be  This test is no t yet approved or cleared by the [place] FDA and  has been authorized for detection and/or diagnosis of SARS-CoV-2 by FDA under an Emergency Use Authorization (EUA). This EUA will remain  in effect (meaning this test can be used) for the duration of the COVID-19 declaration under Section 564(b)(1) of the Act, 21 U.S.C.section 360bbb-3(b)(1), unless the authorization is terminated  or revoked sooner.       Influenza A by PCR NEGATIVE NEGATIVE Final   Influenza B by PCR NEGATIVE NEGATIVE Final    Comment: (NOTE) The Xpert Xpress SARS-CoV-2/FLU/RSV plus assay is intended as an aid in the diagnosis of influenza from Nasopharyngeal swab specimens and should not be used as a sole basis for treatment. Nasal washings and aspirates are unacceptable for Xpert Xpress SARS-CoV-2/FLU/RSV testing.  Fact Sheet for Patients: EntrepreneurPulse.com.au  Fact Sheet for Healthcare Providers: IncredibleEmployment.be  This test is not yet approved or cleared by the [place] FDA and has been authorized for detection and/or diagnosis of SARS-CoV-2 by FDA under an Emergency Use Authorization (EUA). This EUA will remain in effect (meaning this test can be used) for the duration of the COVID-19 declaration under Section 564(b)(1) of the Act, 21 U.S.C. section 360bbb-3(b)(1), unless the authorization is terminated or revoked.  Performed at [facility], [place] [address] [place], [place] [address]   Urine Culture     Status: Abnormal   Collection  Time: 12/14/21 10:19 AM   Specimen: Urine, Clean Catch  Result Value Ref Range Status   Specimen Description URINE, CLEAN CATCH  Final  Special Requests   Final    NONE Performed at [facility], [place] [address] [place], [place] [address]    Culture (A)  Final    >=100,000 COLONIES/mL MULTIPLE SPECIES PRESENT, SUGGEST RECOLLECTION   Report Status 12/15/2021 FINAL  Final    Labs: CBC: Recent Labs  Lab 12/13/21 1256 12/14/21 1015  WBC 12.7* 11.7*  NEUTROABS 8.0*  --   HGB 7.8* 8.1*  HCT 25.8* 26.3*  MCV 87.2 85.9  PLT 344 062   Basic Metabolic Panel: Recent Labs  Lab 12/13/21 1256 12/14/21 1015  NA 132* 137  K 4.4 3.8  CL 96* 100  CO2 23 21*  GLUCOSE 118* 87  BUN 20 19  CREATININE 0.83 1.04*  CALCIUM 9.3 9.0   Liver Function Tests: Recent Labs  Lab 12/13/21 1256 12/14/21 1015  AST 56* 48*  ALT 8 10  ALKPHOS 134* 141*  BILITOT 1.0 0.8  PROT 6.4* 6.2*  ALBUMIN 2.6* 2.7*   CBG: No results for input(s): GLUCAP in the last 168 hours.  Discharge time spent: greater than 30 minutes.  Signed: Estill Cotta, MD [facility] 12/15/2021

## 2021-12-15 NOTE — TOC Transition Note (Signed)
Transition of Care (TOC) - CM/SW Discharge Note ? ? ?Patient Details  ?Name: LAKEISHA WALDROP ?MRN: [id number] ?Date of Birth: [birth year] ? ?Transition of Care [facility]) CM/SW Contact:  ?Geralynn Ochs, LCSW ?Phone Number: ?12/15/2021, 3:57 PM ? ? ?Clinical Narrative:   CSW spoke with [place] earlier today to discuss return with hospice care. Desiree with [facility] confirmed that patient can return, and they can take her back today. Preference for [facility] for hospice agency. CSW sent discharge information and left voicemail for daughter to confirm. CSW discussed with daughter, she is agreeable to [facility] and return today. Transport scheduled with [facility] for next available. ? ?Nurse to call report to [phone]. ? ? ? ?Final next level of care: [facility] ?Barriers to Discharge: Barriers Resolved ? ? ?Patient Goals and CMS Choice ?  ?  ?  ? ?Discharge Placement ?  ?           ?Patient chooses bed at:  ([facility]) ?Patient to be transferred to facility by: [facility] ?Name of family member notified: [name] ?Patient and family notified of of transfer: 12/15/21 ? ?Discharge Plan and Services ?  ?  ?           ?  ?  ?  ?  ?  ?  ?  ?  ?  ?  ? ?Social Determinants of Health (SDOH) Interventions ?  ? ? ?Readmission Risk Interventions ?No flowsheet data found. ? ? ? ? ?

## 2021-12-15 NOTE — Assessment & Plan Note (Signed)
-   Patient complained of left-sided rib pain, rib x-rays showed suspected new fractures of left anterior sixth, seventh and eighth ribs with adjacent pleural thickening.  Multiple late subacute fractures also present.  Patient has underlying widespread bony lucencies. ?-Continue pain control and added Lidoderm patch.  Continue incentive spirometry. ?

## 2021-12-15 NOTE — Progress Notes (Addendum)
Manufacturing engineer [facility]) Hospital Liaison Note ? ?Referral received for patient/family interest in hospice at home. ACC spoke with [name] to confirm interest. Interest confirmed. Chart under review by [facility] physician.  ? ?Hospice eligibility confirmed.  ? ?DME in the home: hospital bed, walker, wheelchair ? ?DME needs: none ? ?Plan is to discharge home today via [facility].  ? ?Please send comfort medications/prescriptions home with patient. Send signed DNR and paperwork with patient.  ? ?Please call with any questions or concerns. Thank you ? ?Roselee Nova, LCSW ?[facility] Liaison ?[phone] ?

## 2022-01-03 DEATH — deceased

## 2022-03-17 ENCOUNTER — Telehealth: Payer: Self-pay | Admitting: Cardiology

## 2022-03-17 NOTE — Telephone Encounter (Signed)
   Pt's daughter said, pt passed away and they are very grateful for the care you both provided for the patient
